# Patient Record
Sex: Male | Born: 1955 | Race: Black or African American | Hispanic: No | Smoking: Former smoker
Health system: Southern US, Community
[De-identification: ages and names within clinical notes are randomized; demographics above are authoritative.]

## PROBLEM LIST (undated history)

## (undated) DIAGNOSIS — K921 Melena: Secondary | ICD-10-CM

## (undated) DIAGNOSIS — E119 Type 2 diabetes mellitus without complications: Secondary | ICD-10-CM

## (undated) DIAGNOSIS — I252 Old myocardial infarction: Secondary | ICD-10-CM

## (undated) DIAGNOSIS — R079 Chest pain, unspecified: Secondary | ICD-10-CM

## (undated) DIAGNOSIS — C61 Malignant neoplasm of prostate: Secondary | ICD-10-CM

## (undated) DIAGNOSIS — M109 Gout, unspecified: Secondary | ICD-10-CM

## (undated) DIAGNOSIS — I10 Essential (primary) hypertension: Secondary | ICD-10-CM

## (undated) DIAGNOSIS — Z9289 Personal history of other medical treatment: Secondary | ICD-10-CM

## (undated) HISTORY — DX: Chest pain, unspecified: R07.9

## (undated) HISTORY — DX: Old myocardial infarction: I25.2

## (undated) HISTORY — DX: Morbid (severe) obesity due to excess calories: E66.01

## (undated) HISTORY — DX: Melena: K92.1

## (undated) HISTORY — PX: OTHER SURGICAL HISTORY: SHX169

## (undated) HISTORY — DX: Personal history of other medical treatment: Z92.89

## (undated) HISTORY — DX: Gout, unspecified: M10.9

---

## 2006-03-05 ENCOUNTER — Other Ambulatory Visit: Payer: Self-pay

## 2006-03-05 ENCOUNTER — Emergency Department: Payer: Self-pay | Admitting: Emergency Medicine

## 2006-12-31 ENCOUNTER — Emergency Department: Payer: Self-pay | Admitting: Emergency Medicine

## 2008-05-09 ENCOUNTER — Emergency Department: Payer: Self-pay | Admitting: Emergency Medicine

## 2009-10-07 ENCOUNTER — Emergency Department: Payer: Self-pay | Admitting: Internal Medicine

## 2009-12-27 DEATH — deceased

## 2011-11-13 ENCOUNTER — Emergency Department: Payer: Self-pay | Admitting: Unknown Physician Specialty

## 2012-07-30 ENCOUNTER — Emergency Department: Payer: Self-pay | Admitting: Emergency Medicine

## 2013-09-07 ENCOUNTER — Emergency Department: Payer: Self-pay | Admitting: Emergency Medicine

## 2013-09-07 LAB — COMPREHENSIVE METABOLIC PANEL
Anion Gap: 7 (ref 7–16)
BUN: 18 mg/dL (ref 7–18)
Bilirubin,Total: 0.3 mg/dL (ref 0.2–1.0)
Calcium, Total: 9.1 mg/dL (ref 8.5–10.1)
Co2: 27 mmol/L (ref 21–32)
EGFR (African American): >60
EGFR (Non-African Amer.): >60
Glucose: 122 mg/dL — ABNORMAL HIGH (ref 65–99)
Potassium: 4 mmol/L (ref 3.5–5.1)
SGOT(AST): 16 U/L (ref 15–37)
Total Protein: 7.6 g/dL (ref 6.4–8.2)

## 2013-09-07 LAB — CBC
HCT: 35 % — ABNORMAL LOW (ref 40.0–52.0)
HGB: 12.2 g/dL — ABNORMAL LOW (ref 13.0–18.0)
MCHC: 35 g/dL (ref 32.0–36.0)
MCV: 90 fL (ref 80–100)
RDW: 13.4 % (ref 11.5–14.5)
WBC: 8.6 10*3/uL (ref 3.8–10.6)

## 2013-09-07 LAB — URIC ACID: Uric Acid: 3.6 mg/dL (ref 3.5–7.2)

## 2013-09-23 ENCOUNTER — Emergency Department: Payer: Self-pay | Admitting: Emergency Medicine

## 2013-09-23 LAB — CBC WITH DIFFERENTIAL/PLATELET
Basophil #: 0.1 10*3/uL (ref 0.0–0.1)
Basophil %: 1.3 %
Eosinophil %: 3 %
HGB: 11.5 g/dL — ABNORMAL LOW (ref 13.0–18.0)
Lymphocyte #: 2.1 10*3/uL (ref 1.0–3.6)
MCH: 31 pg (ref 26.0–34.0)
MCV: 91 fL (ref 80–100)
Monocyte #: 0.5 x10 3/mm (ref 0.2–1.0)
Monocyte %: 5.3 %
Neutrophil %: 66.3 %
RBC: 3.71 10*6/uL — ABNORMAL LOW (ref 4.40–5.90)

## 2013-09-23 LAB — COMPREHENSIVE METABOLIC PANEL
Albumin: 3.4 g/dL (ref 3.4–5.0)
Alkaline Phosphatase: 117 U/L (ref 50–136)
Anion Gap: 3 — ABNORMAL LOW (ref 7–16)
BUN: 27 mg/dL — ABNORMAL HIGH (ref 7–18)
Bilirubin,Total: 0.3 mg/dL (ref 0.2–1.0)
Co2: 28 mmol/L (ref 21–32)
Glucose: 116 mg/dL — ABNORMAL HIGH (ref 65–99)
Osmolality: 280 (ref 275–301)
SGOT(AST): 27 U/L (ref 15–37)
SGPT (ALT): 23 U/L (ref 12–78)
Sodium: 137 mmol/L (ref 136–145)
Total Protein: 8.1 g/dL (ref 6.4–8.2)

## 2013-09-23 LAB — TROPONIN I
Troponin-I: <0.02 ng/mL
Troponin-I: <0.02 ng/mL

## 2013-09-23 LAB — TSH: Thyroid Stimulating Horm: 2.49 u[IU]/mL

## 2013-09-24 ENCOUNTER — Ambulatory Visit: Payer: Self-pay | Admitting: Physician Assistant

## 2015-02-07 ENCOUNTER — Emergency Department: Payer: Self-pay | Admitting: Student

## 2015-03-30 IMAGING — CR DG CHEST 2V
1 series · 2 of 2 positions shown · non-contrast
Comparison: none

REASON FOR EXAM: Chest Pain
COMMENTS:

PROCEDURE:     DXR - DXR CHEST PA (OR AP) AND LATERAL  - September 07, 2013  [DATE]
RESULT:     The lungs are clear. The cardiac silhouette and visualized bony
skeleton are unremarkable.

[Series 1: w chest pa · 0.14mm/px · 2 of 2 slices shown]
[im 1/2]
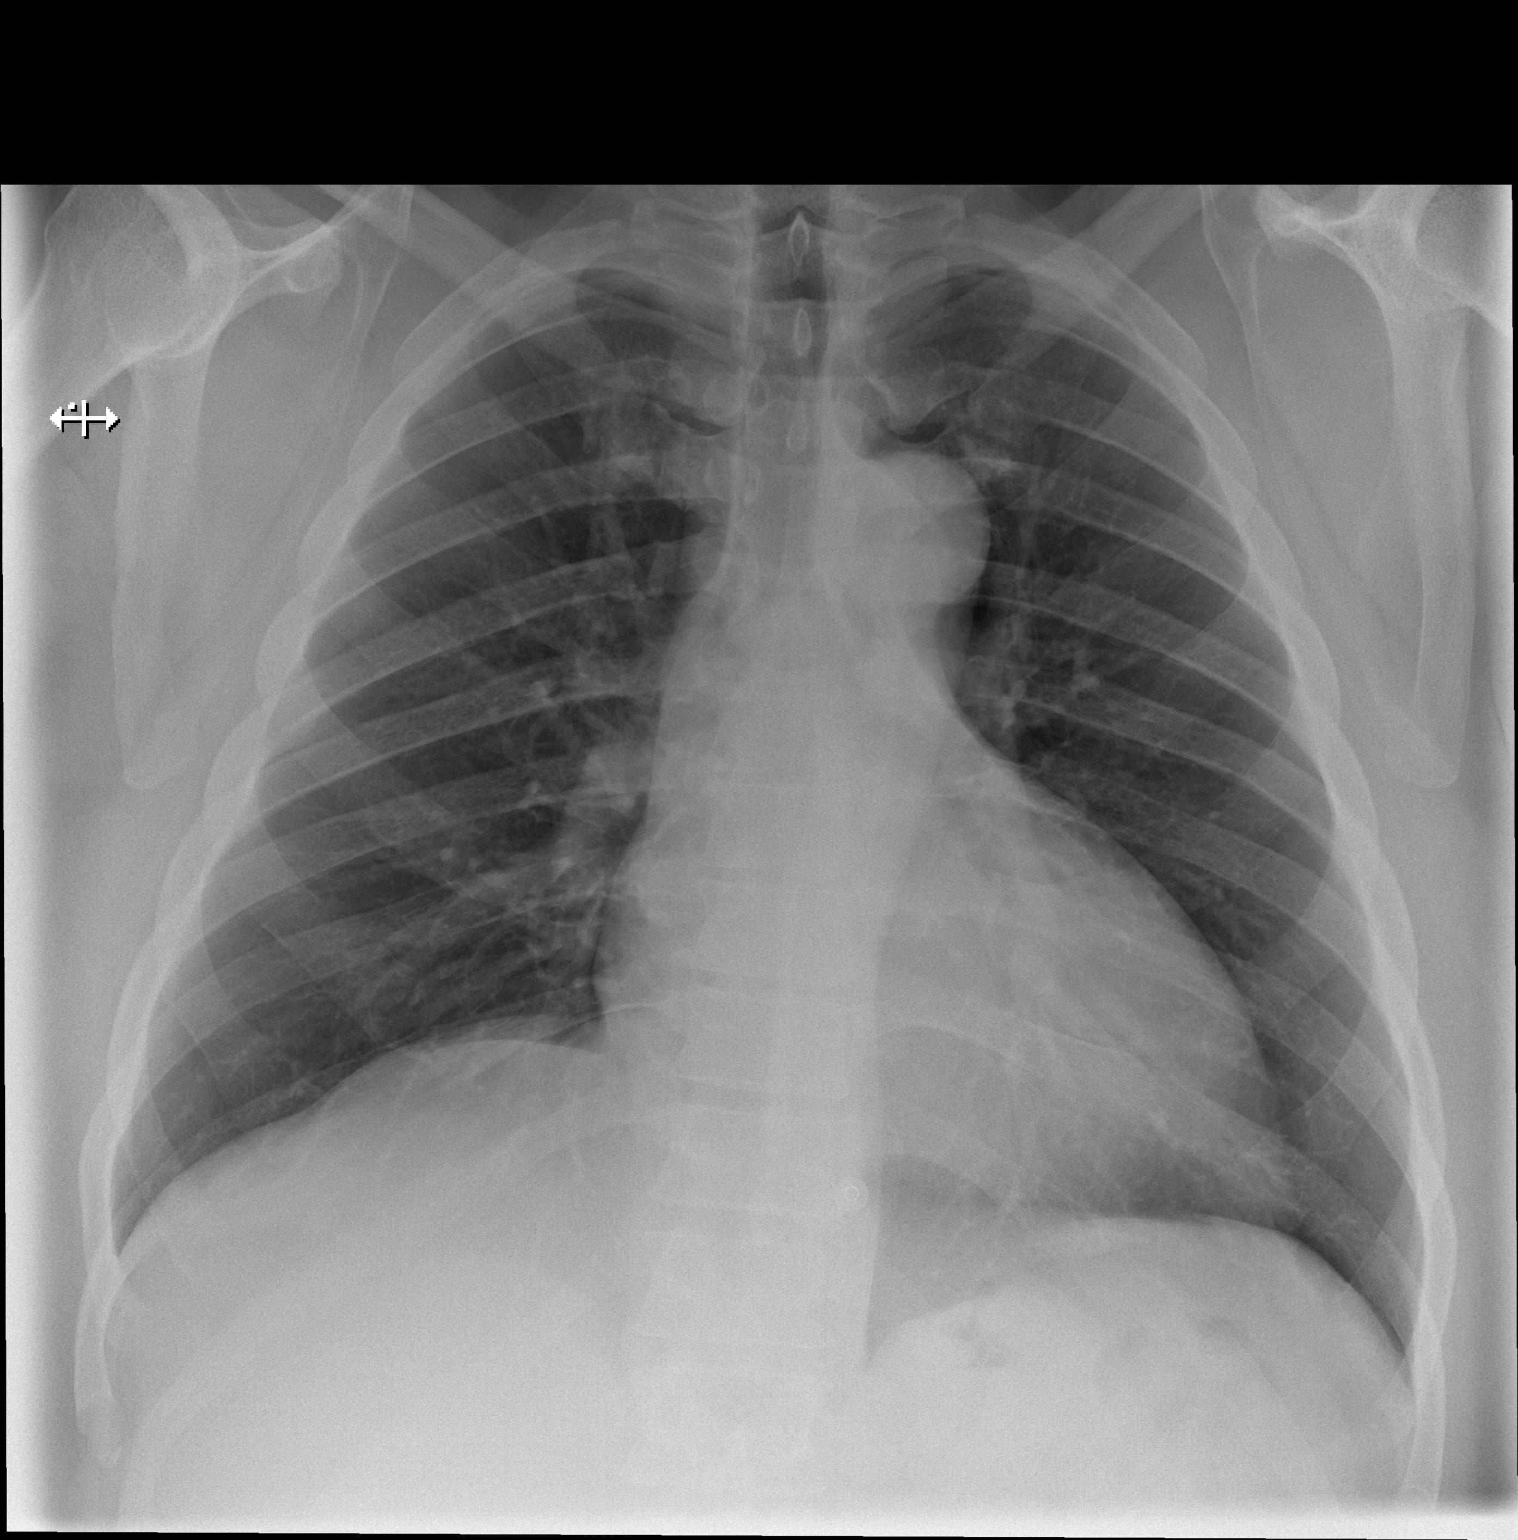
[im 2/2]
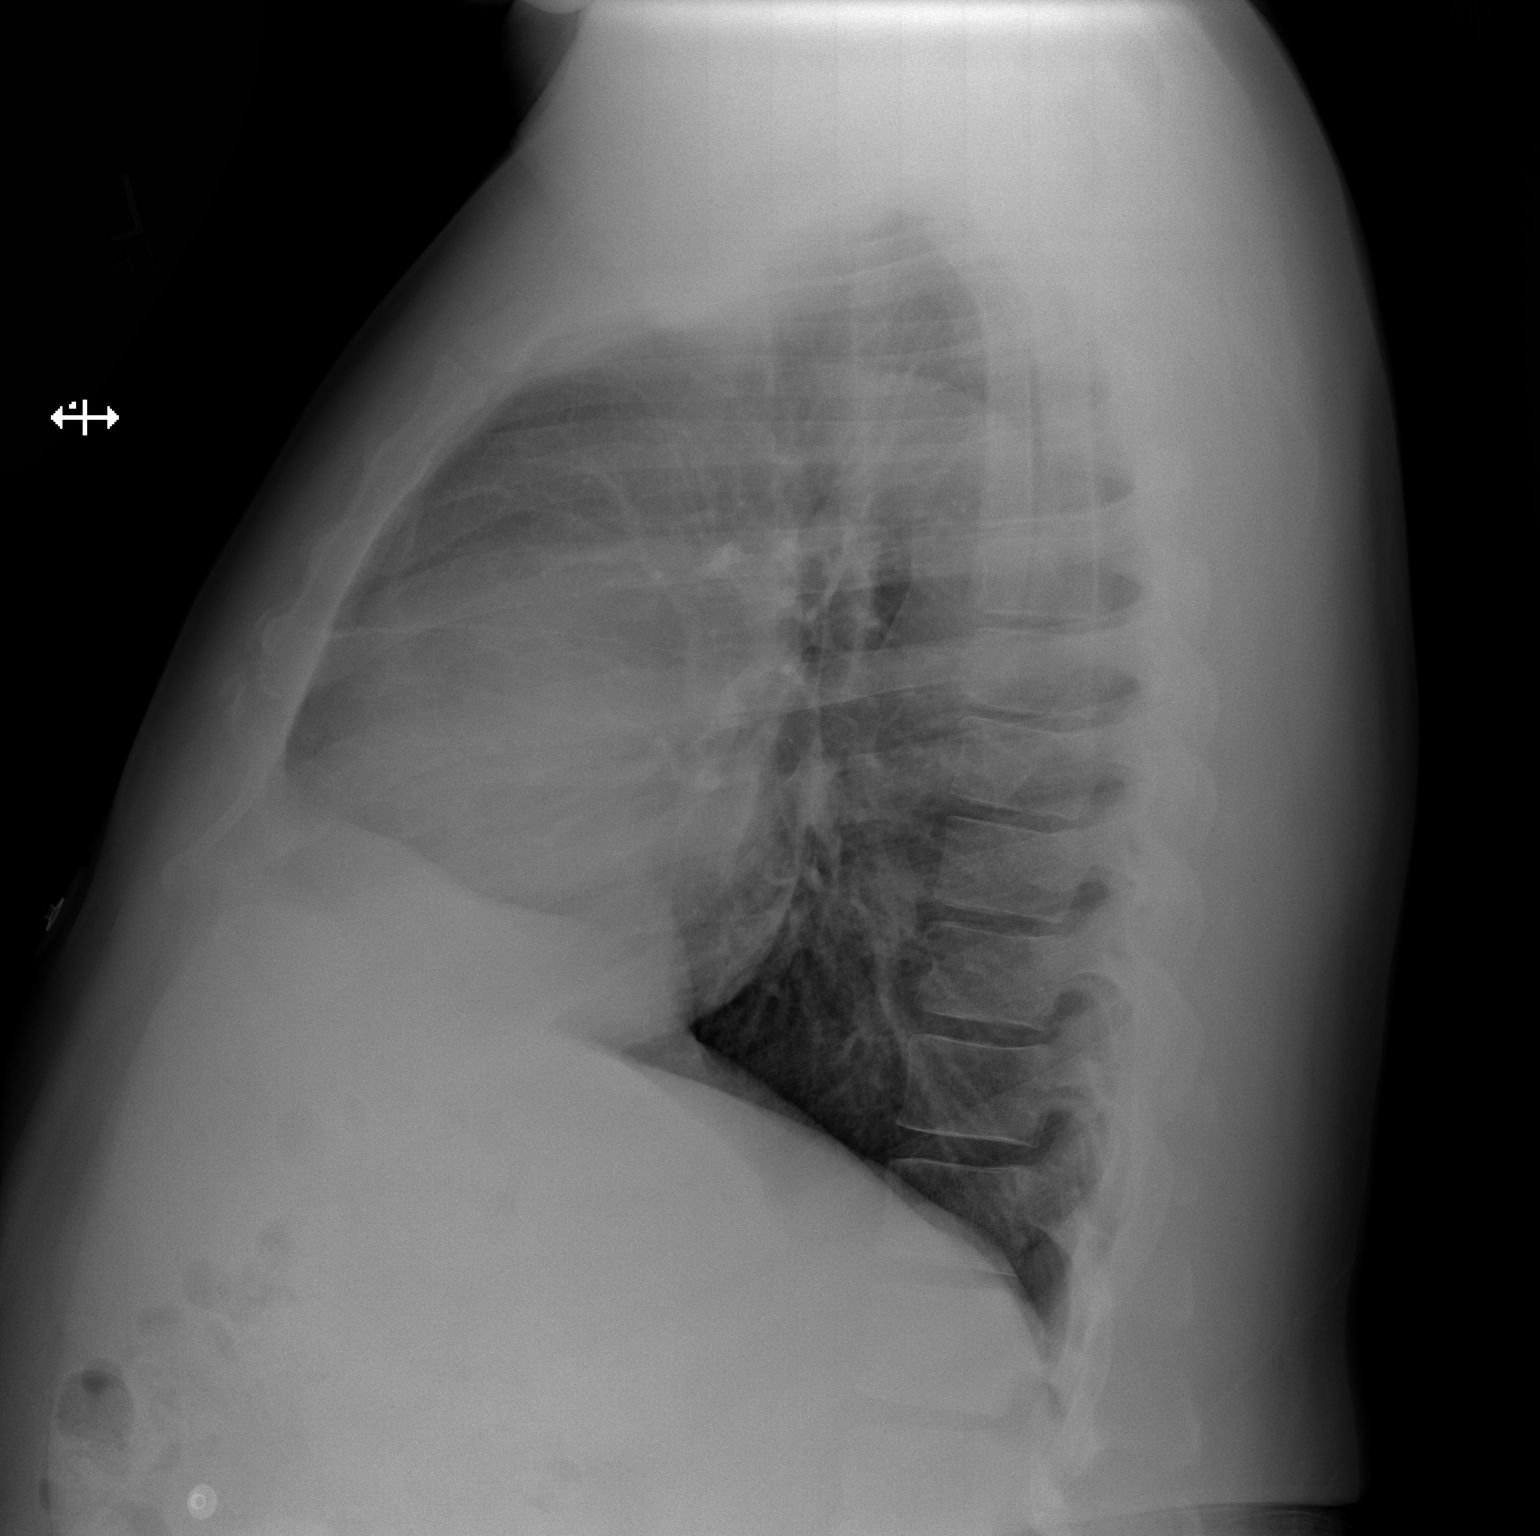

[2 of 2 positions shown; findings below may reference images not displayed]

IMPRESSION: 1. Chest radiograph without evidence of acute cardiopulmonary disease.
2. Comparison dated 07/30/2012.

## 2015-04-24 ENCOUNTER — Emergency Department
Admission: EM | Admit: 2015-04-24 | Discharge: 2015-04-24 | Disposition: A | Discharge status: Home / Self Care | Payer: Self-pay | Attending: Emergency Medicine | Admitting: Emergency Medicine

## 2015-04-24 ENCOUNTER — Encounter: Payer: Self-pay | Admitting: Emergency Medicine

## 2015-04-24 DIAGNOSIS — Y9389 Activity, other specified: Secondary | ICD-10-CM | POA: Insufficient documentation

## 2015-04-24 DIAGNOSIS — S41112A Laceration without foreign body of left upper arm, initial encounter: Secondary | ICD-10-CM | POA: Insufficient documentation

## 2015-04-24 DIAGNOSIS — Y92009 Unspecified place in unspecified non-institutional (private) residence as the place of occurrence of the external cause: Secondary | ICD-10-CM | POA: Insufficient documentation

## 2015-04-24 DIAGNOSIS — Y998 Other external cause status: Secondary | ICD-10-CM | POA: Insufficient documentation

## 2015-04-24 MED ORDER — LIDOCAINE-EPINEPHRINE (PF) 1 %-1:200000 IJ SOLN
INTRAMUSCULAR | Status: AC
  Administered 2015-04-24: 20:00:00
  Filled 2015-04-24: qty 30

## 2015-04-24 NOTE — Discharge Instructions (Signed)
Laceration Care, Adult A laceration is a cut that goes through all layers of the skin. The cut goes into the tissue beneath the skin. HOME CARE For stitches (sutures) or staples:  Keep the cut clean and dry.  If you have a bandage (dressing), change it at least once a day. Change the bandage if it gets wet or dirty, or as told by your doctor.  Wash the cut with soap and water 2 times a day. Rinse the cut with water. Pat it dry with a clean towel.  Put a thin layer of medicated cream on the cut as told by your doctor.  You may shower after the first 24 hours. Do not soak the cut in water until the stitches are removed.  Only take medicines as told by your doctor.  Have your stitches or staples removed as told by your doctor. For skin adhesive strips:  Keep the cut clean and dry.  Do not get the strips wet. You may take a bath, but be careful to keep the cut dry.  If the cut gets wet, pat it dry with a clean towel.  The strips will fall off on their own. Do not remove the strips that are still stuck to the cut. For wound glue:  You may shower or take baths. Do not soak or scrub the cut. Do not swim. Avoid heavy sweating until the glue falls off on its own. After a shower or bath, pat the cut dry with a clean towel.  Do not put medicine on your cut until the glue falls off.  If you have a bandage, do not put tape over the glue.  Avoid lots of sunlight or tanning lamps until the glue falls off. Put sunscreen on the cut for the first year to reduce your scar.  The glue will fall off on its own. Do not pick at the glue. You may need a tetanus shot if:  You cannot remember when you had your last tetanus shot.  You have never had a tetanus shot. If you need a tetanus shot and you choose not to have one, you may get tetanus. Sickness from tetanus can be serious. GET HELP RIGHT AWAY IF:   Your pain does not get better with medicine.  Your arm, hand, leg, or foot loses feeling  (numbness) or changes color.  Your cut is bleeding.  Your joint feels weak, or you cannot use your joint.  You have painful lumps on your body.  Your cut is red, puffy (swollen), or painful.  You have a red line on the skin near the cut.  You have yellowish-white fluid (pus) coming from the cut.  You have a fever.  You have a bad smell coming from the cut or bandage.  Your cut breaks open before or after stitches are removed.  You notice something coming out of the cut, such as wood or glass.  You cannot move a finger or toe. MAKE SURE YOU:   Understand these instructions.  Will watch your condition.  Will get help right away if you are not doing well or get worse. Document Released: 04/30/2008 Document Revised: 02/04/2012 Document Reviewed: 05/08/2011 [facility] Patient Information 2015 [place], [place]. This information is not intended to replace advice given to you by your health care provider. Make sure you discuss any questions you have with your health care provider.   Keep the wound clean, dry, and covered.  Return as needed for wound checks.  Return in  12-14 days for suture removal.
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
## 2015-04-24 NOTE — ED Notes (Signed)
Barkley Boards, RN assessed patient (unable to chart due to employee login trouble)
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
## 2015-05-08 ENCOUNTER — Emergency Department
Admission: EM | Admit: 2015-05-08 | Discharge: 2015-05-08 | Disposition: A | Discharge status: Home / Self Care | Payer: Self-pay | Attending: Emergency Medicine | Admitting: Emergency Medicine

## 2015-05-08 DIAGNOSIS — IMO0002 Reserved for concepts with insufficient information to code with codable children: Secondary | ICD-10-CM

## 2015-05-08 DIAGNOSIS — Z4801 Encounter for change or removal of surgical wound dressing: Secondary | ICD-10-CM | POA: Insufficient documentation

## 2015-05-08 DIAGNOSIS — Z4802 Encounter for removal of sutures: Secondary | ICD-10-CM | POA: Insufficient documentation

## 2015-05-08 DIAGNOSIS — I10 Essential (primary) hypertension: Secondary | ICD-10-CM | POA: Insufficient documentation

## 2015-05-08 HISTORY — DX: Essential (primary) hypertension: I10

## 2015-05-08 NOTE — ED Provider Notes (Signed)
[facility] Emergency Department Provider Note  ____________________________________________  Time seen: Approximately 1045  I have reviewed the triage vital signs and the nursing notes.   HISTORY  Chief Complaint Suture / Staple Removal    HPI Roger [name] is a 59 y.o. male here today for suture removal states that he had sutures placed in his left tricep approximately 2 weeks ago has had no complications with the denies any pain says it itches a little bit no numbness no tingling no drainage no inflammation   Past Medical History  Diagnosis Date  . Hypertension     There are no active problems to display for this patient.   No past surgical history on file.  No current outpatient prescriptions on file.  Allergies Review of patient's allergies indicates no known allergies.  No family history on file.  Social History History  Substance Use Topics  . Smoking status: Never Smoker   . Smokeless tobacco: Not on file  . Alcohol Use: No    Review of Systems Constitutional: No fever/chills Eyes: No visual changes. ENT: No sore throat. Cardiovascular: Denies chest pain. Respiratory: Denies shortness of breath. Gastrointestinal: No abdominal pain.  No nausea, no vomiting.  No diarrhea.  No constipation. Genitourinary: Negative for dysuria. Musculoskeletal: Negative for back pain. Skin: Negative for rash. Neurological: Negative for headaches, focal weakness or numbness.  10-point ROS otherwise negative.  ____________________________________________   PHYSICAL EXAM:  VITAL SIGNS: ED Triage Vitals  Enc Vitals Group     BP 05/08/15 1012 171/126 mmHg     Pulse Rate 05/08/15 1012 79     Resp 05/08/15 1012 18     Temp 05/08/15 1012 98 F (36.7 C)     Temp Source 05/08/15 1012 Oral     SpO2 05/08/15 1012 95 %     Weight 05/08/15 1012 275 lb (124.739 kg)     Height 05/08/15 1012 5\' 10"  (1.778 m)     Head Cir --      Peak Flow --      Pain Score 05/08/15 1014 6     Pain Loc --      Pain Edu? --      Excl. in [place]? --     Constitutional: Alert and oriented. Well appearing and in no acute distress.  Musculoskeletal: No lower extremity tenderness nor edema.  No joint effusions. Neurologic:  Normal speech and language. No gross focal neurologic deficits are appreciated. Speech is normal. No gait instability. Skin:  Skin is warm, dry and intact. No rash noted. Well-healing wound to the left tricep 14 sutures in place Psychiatric: Mood and affect are normal. Speech and behavior are normal.  ____________________________________________   PROCEDURES  Procedure(s) performed: None  Critical Care performed: No  ____________________________________________   INITIAL IMPRESSION / ASSESSMENT AND PLAN / ED COURSE  Pertinent labs & imaging results that were available during my care of the patient were reviewed by me and considered in my medical decision making (see chart for details).  Initial impression on this patient laceration recheck suture removal patient wound was well-healed sutures were removed was discharged home ____________________________________________   FINAL CLINICAL IMPRESSION(S) / ED DIAGNOSES  Final diagnoses:  Visit for suture removal  Laceration re-check     Roger Rosenwald JAMESEN STAHNKE, PA-C 05/08/15 1104  Lisa Roca, MD 05/08/15 1529

## 2015-05-08 NOTE — ED Notes (Signed)
Pt here for suture removal of left upper arm

## 2015-05-08 NOTE — ED Notes (Signed)
NAD noted at time of D/C. Pt denies questions or concerns. Pt ambulatory to the lobby at this time.  

## 2015-11-11 ENCOUNTER — Emergency Department
Admission: EM | Admit: 2015-11-11 | Discharge: 2015-11-11 | Disposition: A | Discharge status: Home / Self Care | Payer: Self-pay | Attending: Emergency Medicine | Admitting: Emergency Medicine

## 2015-11-11 ENCOUNTER — Encounter: Payer: Self-pay | Admitting: *Deleted

## 2015-11-11 DIAGNOSIS — I10 Essential (primary) hypertension: Secondary | ICD-10-CM | POA: Insufficient documentation

## 2015-11-11 DIAGNOSIS — M1000 Idiopathic gout, unspecified site: Secondary | ICD-10-CM | POA: Insufficient documentation

## 2015-11-11 LAB — BASIC METABOLIC PANEL
ANION GAP: 9 (ref 5–15)
BUN: 25 mg/dL — ABNORMAL HIGH (ref 6–20)
CO2: 26 mmol/L (ref 22–32)
Calcium: 9.3 mg/dL (ref 8.9–10.3)
Chloride: 104 mmol/L (ref 101–111)
Creatinine, Ser: 1.26 mg/dL — ABNORMAL HIGH (ref 0.61–1.24)
GFR calc Af Amer: >60 mL/min (ref >60)
GFR calc non Af Amer: >60 mL/min (ref >60)
Glucose, Bld: 128 mg/dL — ABNORMAL HIGH (ref 65–99)
Potassium: 4 mmol/L (ref 3.5–5.1)
Sodium: 139 mmol/L (ref 135–145)

## 2015-11-11 LAB — CBC WITH DIFFERENTIAL/PLATELET
BASOS ABS: 0 10*3/uL (ref 0–0.1)
Basophils Relative: 0 %
Eosinophils Absolute: 0.2 10*3/uL (ref 0–0.7)
Eosinophils Relative: 2 %
HEMATOCRIT: 36.2 % — AB (ref 40.0–52.0)
Hemoglobin: 12.1 g/dL — ABNORMAL LOW (ref 13.0–18.0)
LYMPHS ABS: 1.5 10*3/uL (ref 1.0–3.6)
Lymphocytes Relative: 15 %
MCH: 30.6 pg (ref 26.0–34.0)
MCHC: 33.3 g/dL (ref 32.0–36.0)
MCV: 92 fL (ref 80.0–100.0)
Monocytes Absolute: 0.7 10*3/uL (ref 0.2–1.0)
Monocytes Relative: 7 %
NEUTROS ABS: 7.7 10*3/uL — AB (ref 1.4–6.5)
Neutrophils Relative %: 76 %
Platelets: 295 10*3/uL (ref 150–440)
RBC: 3.93 MIL/uL — ABNORMAL LOW (ref 4.40–5.90)
RDW: 13 % (ref 11.5–14.5)
WBC: 10.2 10*3/uL (ref 3.8–10.6)

## 2015-11-11 LAB — TROPONIN I: Troponin I: <0.03 ng/mL (ref <0.031)

## 2015-11-11 LAB — URIC ACID: Uric Acid, Serum: 9 mg/dL — ABNORMAL HIGH (ref 4.4–7.6)

## 2015-11-11 MED ORDER — PREDNISONE 20 MG PO TABS
60.0000 mg | ORAL_TABLET | Freq: Once | ORAL | Status: AC
  Administered 2015-11-11: 60 mg via ORAL
  Filled 2015-11-11: qty 3

## 2015-11-11 MED ORDER — IBUPROFEN 800 MG PO TABS: 800.0000 mg | ORAL_TABLET | Freq: Three times a day (TID) | ORAL | Status: DC | PRN

## 2015-11-11 MED ORDER — IBUPROFEN 800 MG PO TABS
800.0000 mg | ORAL_TABLET | Freq: Once | ORAL | Status: AC
  Administered 2015-11-11: 800 mg via ORAL
  Filled 2015-11-11: qty 1

## 2015-11-11 MED ORDER — PANTOPRAZOLE SODIUM 40 MG PO TBEC: 40.0000 mg | DELAYED_RELEASE_TABLET | Freq: Every day | ORAL | Status: DC

## 2015-11-11 MED ORDER — PREDNISONE 10 MG PO TABS: 10.0000 mg | ORAL_TABLET | Freq: Every day | ORAL | Status: DC

## 2015-11-11 NOTE — ED Provider Notes (Signed)
[facility] Emergency Department Provider Note  ____________________________________________  Time seen: Approximately 2:02 PM  I have reviewed the triage vital signs and the nursing notes.   HISTORY  Chief Complaint Dizziness    HPI Roger [name] is a 59 y.o. male who reports his gout is flaring up. He says for the last 2 weeks as well as been cold he's been having a lot of pain in his feet and ankles mid foot and especially the MCP of the great toe. He also has some pain in his right elbow and hands. Which she says is his usual gouty pain. Been going to work on crutches because this helps keep some of the weight off his feet and he can then walk. Patient reports he is not drinking much because he doesn't want to have to go to walk to the bathroom on his painful feet. Patient reports after he stands for long time he gets a little bit lightheaded. Patient has no nausea vomiting headache chills cough diarrhea no spinning sensation no other complaints.   Past Medical History  Diagnosis Date  . Hypertension    patient is also taking metformin probably for diabetes although he says he doesn't have it  There are no active problems to display for this patient.   History reviewed. No pertinent past surgical history.  Current Outpatient Rx  Name  Route  Sig  Dispense  Refill  . ibuprofen (ADVIL,MOTRIN) 800 MG tablet   Oral   Take 1 tablet (800 mg total) by mouth every 8 (eight) hours as needed.   30 tablet   0   . pantoprazole (PROTONIX) 40 MG tablet   Oral   Take 1 tablet (40 mg total) by mouth daily.   30 tablet   1   . predniSONE (DELTASONE) 10 MG tablet   Oral   Take 1 tablet (10 mg total) by mouth daily. Take 4 pills a day for 3 days then Take 3 pills a day for 1 day then Take 2 pills a day for 1 day then thakl 1 pill a day for  1 day then stop   18 tablet   0     Allergies Review of patient's allergies indicates no known allergies.  No  family history on file.  Social History Social History  Substance Use Topics  . Smoking status: Never Smoker   . Smokeless tobacco: None  . Alcohol Use: No    Review of Systems Constitutional: No fever/chills Eyes: No visual changes. ENT: No sore throat. Cardiovascular: Denies chest pain. Respiratory: Denies shortness of breath. Gastrointestinal: No abdominal pain.  No nausea, no vomiting.  No diarrhea.  No constipation. Genitourinary: Negative for dysuria. Musculoskeletal: Negative for back pain. Skin: Negative for rash. Neurological: Negative for headaches, focal weakness or numbness.  10-point ROS otherwise negative.  ____________________________________________   PHYSICAL EXAM:  VITAL SIGNS: ED Triage Vitals  Enc Vitals Group     BP 11/11/15 1253 121/72 mmHg     Pulse Rate 11/11/15 1253 91     Resp 11/11/15 1253 20     Temp 11/11/15 1253 97.8 F (36.6 C)     Temp Source 11/11/15 1253 Oral     SpO2 11/11/15 1253 98 %     Weight 11/11/15 1253 270 lb (122.471 kg)     Height 11/11/15 1253 5\' 10"  (1.778 m)     Head Cir --      Peak Flow --  Pain Score 11/11/15 1254 10     Pain Loc --      Pain Edu? --      Excl. in [place]? --     Constitutional: Alert and oriented. Well appearing and in no acute distress. Eyes: Conjunctivae are normal. PERRL. EOMI. Head: Atraumatic. Nose: No congestion/rhinnorhea. Mouth/Throat: Mucous membranes are moist.  Oropharynx non-erythematous. Neck: No stridor.        Cardiovascular: Normal rate, regular rhythm. Grossly normal heart sounds.  Good peripheral circulation. Respiratory: Normal respiratory effort.  No retractions. Lungs CTAB. Gastrointestinal: Soft and nontender. No distention. No abdominal bruits. No CVA tenderness. Musculoskeletal: Both ankles are somewhat tender to palpation. They're not swollen or red however. The mid foot on both feet are tender as well without any redness or swelling. There is redness and swelling and  tenderness and warmth over both MCP joints of both great toes. Patient has a nodule in the palm of the left hand over the next flexor tendon of the ring finger and there is a little bit of palpable nodule on the ulnar surface of the forearm Neurologic:  Normal speech and language. No gross focal neurologic deficits are appreciated. No gait instability. Skin:  Skin is warm, dry and intact. No rash noted. Psychiatric: Mood and affect are normal. Speech and behavior are normal.  ____________________________________________   LABS (all labs ordered are listed, but only abnormal results are displayed)  Labs Reviewed  BASIC METABOLIC PANEL - Abnormal; Notable for the following:    Glucose, Bld 128 (*)    BUN 25 (*)    Creatinine, Ser 1.26 (*)    All other components within normal limits  CBC WITH DIFFERENTIAL/PLATELET - Abnormal; Notable for the following:    RBC 3.93 (*)    Hemoglobin 12.1 (*)    HCT 36.2 (*)    Neutro Abs 7.7 (*)    All other components within normal limits  URIC ACID - Abnormal; Notable for the following:    Uric Acid, Serum 9.0 (*)    All other components within normal limits  TROPONIN I   ____________________________________________  EKG   ____________________________________________  RADIOLOGY   ____________________________________________   PROCEDURES   ____________________________________________   INITIAL IMPRESSION / ASSESSMENT AND PLAN / ED COURSE  Pertinent labs & imaging results that were available during my care of the patient were reviewed by me and considered in my medical decision making (see chart for details).   ____________________________________________   FINAL CLINICAL IMPRESSION(S) / ED DIAGNOSES  Final diagnoses:  Idiopathic gout, unspecified chronicity, unspecified site      Nena Polio, MD 11/11/15 2155

## 2015-11-11 NOTE — ED Notes (Signed)
Bilateral feet pain and swellingdizziness started today

## 2015-11-11 NOTE — Discharge Instructions (Signed)
Gout Gout is an inflammatory arthritis caused by a buildup of uric acid crystals in the joints. Uric acid is a chemical that is normally present in the blood. When the level of uric acid in the blood is too high it can form crystals that deposit in your joints and tissues. This causes joint redness, soreness, and swelling (inflammation). Repeat attacks are common. Over time, uric acid crystals can form into masses (tophi) near a joint, destroying bone and causing disfigurement. Gout is treatable and often preventable. CAUSES  The disease begins with elevated levels of uric acid in the blood. Uric acid is produced by your body when it breaks down a naturally found substance called purines. Certain foods you eat, such as meats and fish, contain high amounts of purines. Causes of an elevated uric acid level include:  Being passed down from parent to child (heredity).  Diseases that cause increased uric acid production (such as obesity, psoriasis, and certain cancers).  Excessive alcohol use.  Diet, especially diets rich in meat and seafood.  Medicines, including certain cancer-fighting medicines (chemotherapy), water pills (diuretics), and aspirin.  Chronic kidney disease. The kidneys are no longer able to remove uric acid well.  Problems with metabolism. Conditions strongly associated with gout include:  Obesity.  High blood pressure.  High cholesterol.  Diabetes. Not everyone with elevated uric acid levels gets gout. It is not understood why some people get gout and others do not. Surgery, joint injury, and eating too much of certain foods are some of the factors that can lead to gout attacks. SYMPTOMS   An attack of gout comes on quickly. It causes intense pain with redness, swelling, and warmth in a joint.  Fever can occur.  Often, only one joint is involved. Certain joints are more commonly involved:  Base of the big toe.  Knee.  Ankle.  Wrist.  Finger. Without  treatment, an attack usually goes away in a few days to weeks. Between attacks, you usually will not have symptoms, which is different from many other forms of arthritis. DIAGNOSIS  Your caregiver will suspect gout based on your symptoms and exam. In some cases, tests may be recommended. The tests may include:  Blood tests.  Urine tests.  X-rays.  Joint fluid exam. This exam requires a needle to remove fluid from the joint (arthrocentesis). Using a microscope, gout is confirmed when uric acid crystals are seen in the joint fluid. TREATMENT  There are two phases to gout treatment: treating the sudden onset (acute) attack and preventing attacks (prophylaxis).  Treatment of an Acute Attack.  Medicines are used. These include anti-inflammatory medicines or steroid medicines.  An injection of steroid medicine into the affected joint is sometimes necessary.  The painful joint is rested. Movement can worsen the arthritis.  You may use warm or cold treatments on painful joints, depending which works best for you.  Treatment to Prevent Attacks.  If you suffer from frequent gout attacks, your caregiver may advise preventive medicine. These medicines are started after the acute attack subsides. These medicines either help your kidneys eliminate uric acid from your body or decrease your uric acid production. You may need to stay on these medicines for a very long time.  The early phase of treatment with preventive medicine can be associated with an increase in acute gout attacks. For this reason, during the first few months of treatment, your caregiver may also advise you to take medicines usually used for acute gout treatment. Be sure you  understand your caregiver's directions. Your caregiver may make several adjustments to your medicine dose before these medicines are effective.  Discuss dietary treatment with your caregiver or dietitian. Alcohol and drinks high in sugar and fructose and foods  such as meat, poultry, and seafood can increase uric acid levels. Your caregiver or dietitian can advise you on drinks and foods that should be limited. HOME CARE INSTRUCTIONS   Do not take aspirin to relieve pain. This raises uric acid levels.  Only take over-the-counter or prescription medicines for pain, discomfort, or fever as directed by your caregiver.  Rest the joint as much as possible. When in bed, keep sheets and blankets off painful areas.  Keep the affected joint raised (elevated).  Apply warm or cold treatments to painful joints. Use of warm or cold treatments depends on which works best for you.  Use crutches if the painful joint is in your leg.  Drink enough fluids to keep your urine clear or pale yellow. This helps your body get rid of uric acid. Limit alcohol, sugary drinks, and fructose drinks.  Follow your dietary instructions. Pay careful attention to the amount of protein you eat. Your daily diet should emphasize fruits, vegetables, whole grains, and fat-free or low-fat milk products. Discuss the use of coffee, vitamin C, and cherries with your caregiver or dietitian. These may be helpful in lowering uric acid levels.  Maintain a healthy body weight. SEEK MEDICAL CARE IF:   You develop diarrhea, vomiting, or any side effects from medicines.  You do not feel better in 24 hours, or you are getting worse. SEEK IMMEDIATE MEDICAL CARE IF:   Your joint becomes suddenly more tender, and you have chills or a fever. MAKE SURE YOU:   Understand these instructions.  Will watch your condition.  Will get help right away if you are not doing well or get worse.   This information is not intended to replace advice given to you by your health care provider. Make sure you discuss any questions you have with your health care provider.   Document Released: 11/09/2000 Document Revised: 12/03/2014 Document Reviewed: 06/25/2012 Elsevier Interactive Patient Education [employer]  I have given you a prescription for Motrin 800 mg 3 times a day to take for the gout. He can take that with the protonic to protect her stomach. Also given you a prescription for the prednisone. I started Roger [name] the prednisone here in the emergency room and then I have given you a tapering dose of the prednisone several more days. I think the prednisone will work faster. It also should have less side effects as far as her stomach in your kidneys. You can save the Motrin and Protonix for the next gout attack if you desire. Please be sure to follow up with your Dr. Princella Ion. Please return if you're worse to get a fever feels sicker or have any other problems develop. Please remember that he isn't prednisone can make your blood sugar go up. Blood sugar should go back down once the prednisone is finished.

## 2016-04-16 ENCOUNTER — Encounter: Payer: Self-pay | Admitting: Family Medicine

## 2016-04-16 ENCOUNTER — Ambulatory Visit (INDEPENDENT_AMBULATORY_CARE_PROVIDER_SITE_OTHER): Payer: BLUE CROSS/BLUE SHIELD | Admitting: Family Medicine

## 2016-04-16 VITALS — BP 156/98 | HR 70 | Temp 98.0°F | Ht 70.0 in | Wt 274.0 lb

## 2016-04-16 DIAGNOSIS — R079 Chest pain, unspecified: Secondary | ICD-10-CM

## 2016-04-16 DIAGNOSIS — M25562 Pain in left knee: Secondary | ICD-10-CM

## 2016-04-16 DIAGNOSIS — I252 Old myocardial infarction: Secondary | ICD-10-CM | POA: Insufficient documentation

## 2016-04-16 DIAGNOSIS — G629 Polyneuropathy, unspecified: Secondary | ICD-10-CM

## 2016-04-16 DIAGNOSIS — M25561 Pain in right knee: Secondary | ICD-10-CM | POA: Diagnosis not present

## 2016-04-16 DIAGNOSIS — I10 Essential (primary) hypertension: Secondary | ICD-10-CM

## 2016-04-16 DIAGNOSIS — M25569 Pain in unspecified knee: Secondary | ICD-10-CM | POA: Insufficient documentation

## 2016-04-16 DIAGNOSIS — K921 Melena: Secondary | ICD-10-CM

## 2016-04-16 LAB — CBC
HCT: 36.2 % — ABNORMAL LOW (ref 39.0–52.0)
Hemoglobin: 12.2 g/dL — ABNORMAL LOW (ref 13.0–17.0)
MCHC: 33.8 g/dL (ref 30.0–36.0)
MCV: 91.1 fl (ref 78.0–100.0)
Platelets: 278 10*3/uL (ref 150.0–400.0)
RBC: 3.98 Mil/uL — ABNORMAL LOW (ref 4.22–5.81)
RDW: 13.7 % (ref 11.5–15.5)
WBC: 9.3 10*3/uL (ref 4.0–10.5)

## 2016-04-16 LAB — COMPREHENSIVE METABOLIC PANEL
ALT: 23 U/L (ref 0–53)
AST: 14 U/L (ref 0–37)
Albumin: 3.8 g/dL (ref 3.5–5.2)
Alkaline Phosphatase: 71 U/L (ref 39–117)
BUN: 27 mg/dL — ABNORMAL HIGH (ref 6–23)
CO2: 30 meq/L (ref 19–32)
Calcium: 9.3 mg/dL (ref 8.4–10.5)
Chloride: 101 mEq/L (ref 96–112)
Creatinine, Ser: 1.27 mg/dL (ref 0.40–1.50)
GFR: 74.93 mL/min (ref >60.00)
Glucose, Bld: 158 mg/dL — ABNORMAL HIGH (ref 70–99)
Potassium: 4 mEq/L (ref 3.5–5.1)
Sodium: 137 mEq/L (ref 135–145)
Total Bilirubin: 0.4 mg/dL (ref 0.2–1.2)
Total Protein: 7 g/dL (ref 6.0–8.3)

## 2016-04-16 LAB — TSH: TSH: 2.78 u[IU]/mL (ref 0.35–4.50)

## 2016-04-16 LAB — BRAIN NATRIURETIC PEPTIDE: PRO B NATRI PEPTIDE: 22 pg/mL (ref 0.0–100.0)

## 2016-04-16 LAB — HEMOGLOBIN A1C: HEMOGLOBIN A1C: 8 % — AB (ref 4.6–6.5)

## 2016-04-16 MED ORDER — METOPROLOL TARTRATE 25 MG PO TABS: 25.0000 mg | ORAL_TABLET | Freq: Two times a day (BID) | ORAL | Status: DC

## 2016-04-16 NOTE — Progress Notes (Signed)
Pre visit review using our clinic review tool, if applicable. No additional management support is needed unless otherwise documented below in the visit note. 

## 2016-04-16 NOTE — Assessment & Plan Note (Addendum)
Uncontrolled. Previously was on a 2 drug regimen. Only on lisinopril now. I discussed increasing his lisinopril though he wanted to minimize lab draws. Given potential for CHF with lower extremity swelling and likely CAD we will place him on a beta blocker, metoprolol. Heart rate should be able to tolerate this. He is not in active CHF exacerbation and he has no diagnosed history of CHF. He'll follow up with cardiology as well. He is given return precautions.

## 2016-04-16 NOTE — Assessment & Plan Note (Addendum)
Patient's symptoms most consistent with stable angina. Slightly atypical in that it occurs on the right side and was exertional and associated shortness of breath. He has risk factors. EKG is stable from previously with inferior ST elevations noted. Patient has no active chest pain. Reportedly has had an MI previously. Doubt VTE given stable vital signs and recurrent nature. Doubt pulmonary cause given benign exam and stable vital signs. Not consistently musculoskeletal cause. We will refer back to cardiology for further evaluation.

## 2016-04-16 NOTE — Patient Instructions (Addendum)
Nice to meet you. We are going to refer you to cardiology for further evaluation of your chest pain. You should try Tylenol for your knee discomfort. We're going to have you complete stool cards for your blood in your stool. We will start you on metoprolol for your blood pressure. If you get too dizzy with this please let us know. If you develop persistent chest pain, shortness of breath, palpitations, blood in her stool, abdominal pain, worsening knee pain, numbness, weakness, vision changes, or any new or changing symptoms please seek medical attention.

## 2016-04-16 NOTE — Progress Notes (Signed)
Patient ID: Roger [name], male   DOB: 1956-04-23, 60 y.o.   MRN: [id number]  Roger Rumps, MD Phone: [phone]  Roger [name] is a 60 y.o. male who presents today for a new patient visit.  Knee pain: Patient notes bilateral knee pain for last several weeks. Started out in the right and then has started to move into the left. States it feels like his gout. Has difficult time describing the pain. Notes minimal swelling in his knees. No fevers. He states he was seen at the walk-in clinic a week ago and treated for gout. Has been taking prednisone. Notes this helped with his right knee though not as much with his left knee. He does take allopurinol.  Chest pain: Patient notes intermittently for the last several months he has had right sided chest discomfort. Notes it as a tightness. Associated with some shortness of breath. No radiation. No diaphoresis. Worsens with exertion and when his knees hurt. Last time this occurred was yesterday. Has previously been advised he had a heart attack by EKG and notes he had a stress test at [facility] several years ago. On review of prior EKGs he appears to have inferior ST elevation. Last saw cardiologist 1 year ago per his report. No history of VTE. No history of pulmonary issues. He does have diabetes and hypertension. He does note some orthostasis with lightheadedness on rising after bending over. No vertigo. No chest pain or shortness of breath at this time. He does note some mild intermittent bilateral lower extremity swelling. No orthopnea or PND.  Blood in his stool: This has been off and on for a year. Sometimes it's enough to make the water pink or red. Only occurs with bowel movements. Sometimes mixed in with the bowel movements. He has not ever had a colonoscopy.  Patient notes neuropathy in his fingers and toes where they intermittently feel numb and feel like pins and needles bilaterally. This has been going on for the last year.  HYPERTENSION Disease  Monitoring Chest pain- yes, see above    Dyspnea- yes, see above Medications Compliance-  taking lisinopril 20 mg daily, reports he was previously on amlodipine though had quite a bit of lightheadedness with this.. Lightheadedness-  some lightheadedness on rising  Edema- yes, see above   Active Ambulatory Problems    Diagnosis Date Noted  . Chest pain 04/16/2016  . Blood in stool 04/16/2016  . Neuropathy ([place]) 04/16/2016  . Knee pain 04/16/2016  . Essential hypertension 04/16/2016   Resolved Ambulatory Problems    Diagnosis Date Noted  . No Resolved Ambulatory Problems   Past Medical History  Diagnosis Date  . Hypertension     No family history on file.  Social History   Social History  . Marital Status: Divorced    Spouse Name: N/A  . Number of Children: N/A  . Years of Education: N/A   Occupational History  . Not on file.   Social History Main Topics  . Smoking status: Never Smoker   . Smokeless tobacco: Never Used  . Alcohol Use: No  . Drug Use: No  . Sexual Activity: Not on file   Other Topics Concern  . Not on file   Social History Narrative    ROS  General:  Negative for nexplained weight loss, fever Skin: Negative for new or changing mole, sore that won't heal HEENT: Positive for trouble seeing, Negative for trouble hearing, ringing in ears, mouth sores, hoarseness, change in voice, dysphagia. CV:  Positive for chest pain, dyspnea, edema, negative for palpitations Resp: Negative for cough, hemoptysis GI: Positive for hematochezia, Negative for nausea, vomiting, diarrhea, constipation, abdominal pain, melena. GU: Positive for sexual difficulty, Negative for dysuria, incontinence, urinary hesitance, hematuria, vaginal or penile discharge, polyuria, lumps in testicle or breasts MSK: Positive for muscle cramps or aches, joint pain or swelling Neuro: Positive for numbness and dizziness, Negative for headaches, weakness, passing out/fainting Psych: Negative  for depression, anxiety, positive for memory problems  Objective  Physical Exam Filed Vitals:   04/16/16 0808 04/16/16 0841  BP: 142/92 156/98  Pulse: 70   Temp: 98 F (36.7 C)     BP Readings from Last 3 Encounters:  04/16/16 156/98  11/11/15 104/70  05/08/15 171/126   Wt Readings from Last 3 Encounters:  04/16/16 274 lb (124.286 kg)  11/11/15 270 lb (122.471 kg)  05/08/15 275 lb (124.739 kg)    Physical Exam  Constitutional: He is well-developed, well-nourished, and in no distress.  HENT:  Head: Normocephalic and atraumatic.  Mouth/Throat: Oropharynx is clear and moist. No oropharyngeal exudate.  Eyes: Conjunctivae are normal. Pupils are equal, round, and reactive to light.  Neck: Neck supple.  Cardiovascular: Normal rate, regular rhythm and normal heart sounds.   Pulmonary/Chest: Effort normal and breath sounds normal.  Abdominal: Soft. Bowel sounds are normal. He exhibits no distension. There is no tenderness. There is no rebound and no guarding.  Genitourinary: Rectum normal and prostate normal. Guaiac negative stool.  Musculoskeletal:  Bilateral knees with no joint swelling or joint line tenderness, no warmth or erythema, no ligamentous laxity, negative McMurray's, patient reports stable varicose veins in bilateral legs  Lymphadenopathy:    He has no cervical adenopathy.  Neurological: He is alert. Gait normal.  Skin: Skin is warm and dry. He is not diaphoretic.  Psychiatric: Mood and affect normal.   EKG: Normal sinus rhythm, rate 63, ST elevation in V2 and V3 similar to previous  Assessment/Plan:   Chest pain Patient's symptoms most consistent with stable angina. Slightly atypical in that it occurs on the right side and was exertional and associated shortness of breath. He has risk factors. EKG is stable from previously with inferior ST elevations noted. Patient has no active chest pain. Reportedly has had an MI previously. Doubt VTE given stable vital signs  and recurrent nature. Doubt pulmonary cause given benign exam and stable vital signs. Not consistently musculoskeletal cause. We will refer back to cardiology for further evaluation.  Blood in stool Patient reports intermittent blood in his stool over the last year. Negative FOBT in the office. He'll complete stool cards at home. We'll refer to GI and check a CBC.  Neuropathy ([place]) Suspect this is related to his diabetes. We'll check an A1c today.  Knee pain Suspect osteoarthritis given benign exam. We'll request x-ray records from the walk-in clinic. At this time he has no pain and we will continue to monitor.  Essential hypertension Uncontrolled. Previously was on a 2 drug regimen. Only on lisinopril now. I discussed increasing his lisinopril though he wanted to minimize lab draws. Given potential for CHF with lower extremity swelling and likely CAD we will place him on a beta blocker, metoprolol. Heart rate should be able to tolerate this. He is not in active CHF exacerbation and he has no diagnosed history of CHF. He'll follow up with cardiology as well. He is given return precautions.    Orders Placed This Encounter  Procedures  . CBC  . Comp  Met (CMET)  . B Nat Peptide  . HgB A1c  . TSH  . Ambulatory referral to Cardiology    Referral Priority:  Routine    Referral Type:  Consultation    Referral Reason:  Specialty Services Required    Requested Specialty:  Cardiology    Number of Visits Requested:  1  . Ambulatory referral to Gastroenterology    Referral Priority:  Routine    Referral Type:  Consultation    Referral Reason:  Specialty Services Required    Number of Visits Requested:  1  . EKG 12-Lead    Roger Rumps, MD [place]

## 2016-04-16 NOTE — Assessment & Plan Note (Signed)
Suspect this is related to his diabetes. We'll check an A1c today.

## 2016-04-16 NOTE — Assessment & Plan Note (Addendum)
Suspect osteoarthritis given benign exam. We'll request x-ray records from the walk-in clinic. At this time he has no pain and we will continue to monitor.

## 2016-04-16 NOTE — Assessment & Plan Note (Addendum)
Patient reports intermittent blood in his stool over the last year. Negative FOBT in the office. He'll complete stool cards at home. We'll refer to GI and check a CBC.

## 2016-05-07 ENCOUNTER — Encounter: Payer: Self-pay | Admitting: Cardiology

## 2016-05-07 ENCOUNTER — Ambulatory Visit (INDEPENDENT_AMBULATORY_CARE_PROVIDER_SITE_OTHER): Payer: BLUE CROSS/BLUE SHIELD | Admitting: Cardiology

## 2016-05-07 VITALS — BP 130/90 | HR 64 | Ht 70.0 in | Wt 269.2 lb

## 2016-05-07 DIAGNOSIS — E669 Obesity, unspecified: Secondary | ICD-10-CM | POA: Diagnosis not present

## 2016-05-07 DIAGNOSIS — R0602 Shortness of breath: Secondary | ICD-10-CM | POA: Diagnosis not present

## 2016-05-07 DIAGNOSIS — I10 Essential (primary) hypertension: Secondary | ICD-10-CM | POA: Diagnosis not present

## 2016-05-07 DIAGNOSIS — R079 Chest pain, unspecified: Secondary | ICD-10-CM

## 2016-05-07 NOTE — Patient Instructions (Addendum)
Medication Instructions:  Your physician recommends that you continue on your current medications as directed. Please refer to the Current Medication list given to you today.   Labwork: None ordered  Testing/Procedures: Your physician has requested that you have an echocardiogram. Echocardiography is a painless test that uses sound waves to create images of your heart. It provides your doctor with information about the size and shape of your heart and how well your heart's chambers and valves are working. This procedure takes approximately one hour. There are no restrictions for this procedure.  Date & Time: _____________________________________________________________  [facility]  Your caregiver has ordered a Stress Test with nuclear imaging. The purpose of this test is to evaluate the blood supply to your heart muscle. This procedure is referred to as a "Non-Invasive Stress Test." This is because other than having an IV started in your vein, nothing is inserted or "invades" your body. Cardiac stress tests are done to find areas of poor blood flow to the heart by determining the extent of coronary artery disease (CAD). Some patients exercise on a treadmill, which naturally increases the blood flow to your heart, while others who are  unable to walk on a treadmill due to physical limitations have a pharmacologic/chemical stress agent called Lexiscan . This medicine will mimic walking on a treadmill by temporarily increasing your coronary blood flow.   Please note: these test may take anywhere between 2-4 hours to complete  PLEASE REPORT TO [facility] AT THE FIRST DESK WILL DIRECT YOU WHERE TO GO  Date of Procedure:__Wednesday May 16, 2016 at 08:30AM__________  Arrival Time for Procedure:__Arrive at 08:15AM to register_______  Instructions regarding medication:   __X__ : Hold diabetes medication morning of procedure (Metformin)  __X_:  Hold Metoprolol the  night before procedure and morning of procedure    PLEASE NOTIFY THE OFFICE AT LEAST 24 HOURS IN ADVANCE IF YOU ARE UNABLE TO KEEP YOUR APPOINTMENT.  [phone] AND  PLEASE NOTIFY NUCLEAR MEDICINE AT [facility] AT LEAST 24 HOURS IN ADVANCE IF YOU ARE UNABLE TO KEEP YOUR APPOINTMENT. [phone]  How to prepare for your Myoview test:   Do not eat or drink after midnight  No caffeine for 24 hours prior to test  No smoking 24 hours prior to test.  Your medication may be taken with water.  If your doctor stopped a medication because of this test, do not take that medication.  Ladies, please do not wear dresses.  Skirts or pants are appropriate. Please wear a short sleeve shirt.  No perfume, cologne or lotion.  Wear comfortable walking shoes. No heels!   Follow-Up: Your physician recommends that you schedule a follow-up appointment after testing to review results.  Date & Time: _________________________________________________________   Any Other Special Instructions Will Be Listed Below (If Applicable).     If you need a refill on your cardiac medications before your next appointment, please call your pharmacy.  Echocardiogram An echocardiogram, or echocardiography, uses sound waves (ultrasound) to produce an image of your heart. The echocardiogram is simple, painless, obtained within a short period of time, and offers valuable information to your health care provider. The images from an echocardiogram can provide information such as:  Evidence of coronary artery disease (CAD).  Heart size.  Heart muscle function.  Heart valve function.  Aneurysm detection.  Evidence of a past heart attack.  Fluid buildup around the heart.  Heart muscle thickening.  Assess heart valve function. LET YOUR  HEALTH CARE PROVIDER KNOW ABOUT:  Any allergies you have.  All medicines you are taking, including vitamins, herbs, eye drops, creams, and over-the-counter  medicines.  Previous problems you or members of your family have had with the use of anesthetics.  Any blood disorders you have.  Previous surgeries you have had.  Medical conditions you have.  Possibility of pregnancy, if this applies. BEFORE THE PROCEDURE  No special preparation is needed. Eat and drink normally.  PROCEDURE   In order to produce an image of your heart, gel will be applied to your chest and a wand-like tool (transducer) will be moved over your chest. The gel will help transmit the sound waves from the transducer. The sound waves will harmlessly bounce off your heart to allow the heart images to be captured in real-time motion. These images will then be recorded.  You may need an IV to receive a medicine that improves the quality of the pictures. AFTER THE PROCEDURE You may return to your normal schedule including diet, activities, and medicines, unless your health care provider tells you otherwise.   This information is not intended to replace advice given to you by your health care provider. Make sure you discuss any questions you have with your health care provider.   Document Released: 11/09/2000 Document Revised: 12/03/2014 Document Reviewed: 07/20/2013 Elsevier Interactive Patient Education 2016 [place].   Pharmacologic Stress Electrocardiogram A pharmacologic stress electrocardiogram is a heart (cardiac) test that uses nuclear imaging to evaluate the blood supply to your heart. This test may also be called a pharmacologic stress electrocardiography. Pharmacologic means that a medicine is used to increase your heart rate and blood pressure.  This stress test is done to find areas of poor blood flow to the heart by determining the extent of coronary artery disease (CAD). Some people exercise on a treadmill, which naturally increases the blood flow to the heart. For those people unable to exercise on a treadmill, a medicine is used. This medicine stimulates  your heart and will cause your heart to beat harder and more quickly, as if you were exercising.  Pharmacologic stress tests can help determine:  The adequacy of blood flow to your heart during increased levels of activity in order to clear you for discharge home.  The extent of coronary artery blockage caused by CAD.  Your prognosis if you have suffered a heart attack.  The effectiveness of cardiac procedures done, such as an angioplasty, which can increase the circulation in your coronary arteries.  Causes of chest pain or pressure. LET [facility] CARE PROVIDER KNOW ABOUT:  Any allergies you have.  All medicines you are taking, including vitamins, herbs, eye drops, creams, and over-the-counter medicines.  Previous problems you or members of your family have had with the use of anesthetics.  Any blood disorders you have.  Previous surgeries you have had.  Medical conditions you have.  Possibility of pregnancy, if this applies.  If you are currently breastfeeding. RISKS AND COMPLICATIONS Generally, this is a safe procedure. However, as with any procedure, complications can occur. Possible complications include:  You develop pain or pressure in the following areas:  Chest.  Jaw or neck.  Between your shoulder blades.  Radiating down your left arm.  Headache.  Dizziness or light-headedness.  Shortness of breath.  Increased or irregular heartbeat.  Low blood pressure.  Nausea or vomiting.  Flushing.  Redness going up the arm and slight pain during injection of medicine.  Heart attack (rare).  BEFORE THE PROCEDURE   Avoid all forms of caffeine for 24 hours before your test or as directed by your health care provider. This includes coffee, tea (even decaffeinated tea), caffeinated sodas, chocolate, cocoa, and certain pain medicines.  Follow your health care provider's instructions regarding eating and drinking before the test.  Take your medicines as  directed at regular times with water unless instructed otherwise. Exceptions may include:  If you have diabetes, ask how you are to take your insulin or pills. It is common to adjust insulin dosing the morning of the test.  If you are taking beta-blocker medicines, it is important to talk to your health care provider about these medicines well before the date of your test. Taking beta-blocker medicines may interfere with the test. In some cases, these medicines need to be changed or stopped 24 hours or more before the test.  If you wear a nitroglycerin patch, it may need to be removed prior to the test. Ask your health care provider if the patch should be removed before the test.  If you use an inhaler for any breathing condition, bring it with you to the test.  If you are an outpatient, bring a snack so you can eat right after the stress phase of the test.  Do not smoke for 4 hours prior to the test or as directed by your health care provider.  Do not apply lotions, powders, creams, or oils on your chest prior to the test.  Wear comfortable shoes and clothing. Let your health care provider know if you were unable to complete or follow the preparations for your test. PROCEDURE   Multiple patches (electrodes) will be put on your chest. If needed, small areas of your chest may be shaved to get better contact with the electrodes. Once the electrodes are attached to your body, multiple wires will be attached to the electrodes, and your heart rate will be monitored.  An IV access will be started. A nuclear trace (isotope) is given. The isotope may be given intravenously, or it may be swallowed. Nuclear refers to several types of radioactive isotopes, and the nuclear isotope lights up the arteries so that the nuclear images are clear. The isotope is absorbed by your body. This results in low radiation exposure.  A resting nuclear image is taken to show how your heart functions at rest.  A  medicine is given through the IV access.  A second scan is done about 1 hour after the medicine injection and determines how your heart functions under stress.  During this stress phase, you will be connected to an electrocardiogram machine. Your blood pressure and oxygen levels will be monitored. AFTER THE PROCEDURE   Your heart rate and blood pressure will be monitored after the test.  You may return to your normal schedule, including diet,activities, and medicines, unless your health care provider tells you otherwise.   This information is not intended to replace advice given to you by your health care provider. Make sure you discuss any questions you have with your health care provider.   Document Released: 03/31/2009 Document Revised: 11/17/2013 Document Reviewed: 07/20/2013 Elsevier Interactive Patient Education [employer].

## 2016-05-07 NOTE — Progress Notes (Signed)
Cardiology Office Note   Date:  05/07/2016   ID:  Roger [name], DOB Sep 02, 1956, MRN [id number]  Referring Doctor:  [name], MD   Cardiologist:   Wende Bushy, MD   Reason for consultation:  Chief Complaint  Patient presents with  . other    Chest pain and edema left leg/foot. Meds reviewed verbally.      History of Present Illness: Roger [name] is a 60 y.o. male who presents for  Chest pain. Patient describes this as a heaviness in the chest associated with shortness of breath. Mainly located in the center of the chest, nonradiating. Mild in intensity, lasting minutes at a time, resolved with rest. This does not  Bother him the most because what bothers him right now is issues with joints and gout.   he reports being told  That he has had a heart attack in the past based on an EKG. Inferior Q waves noted. He eventually was sent to do in 2014 during which time he underwent stress testing. Does not recall what was done for him. Review of care everywhere shows stress test October 2014 showing  Evidence for mild distal anterior, anteroseptal and periapical ischemia s/p regadenoson stress. - LVEF = 52%. Mild apical hypokinesis with otherwise normal systolic function.   he had no clear follow-up with cardiology after that time.   Currently, main complaint is joint pains. No fever, cough, colds, occasional abdominal pain. No orthopnea, PND. 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     ROS:  Please see the history of present illness. Aside from mentioned under HPI, all other systems are reviewed and negative.     Past Medical History  Diagnosis Date  . Hypertension   . Hematochezia   . MI (myocardial infarction) [facility])     Past Surgical History  Procedure Laterality Date  . Thumb surgery       reports that he has never smoked. He has never used smokeless tobacco. He reports that he drinks alcohol. He reports that he does not use illicit  drugs.   family history includes Heart Problems in his mother.   Current Outpatient Prescriptions  Medication Sig Dispense Refill  . allopurinol (ZYLOPRIM) 300 MG tablet Take 300 mg by mouth daily.    . baclofen (LIORESAL) 10 MG tablet Take 1 tablet by mouth 3 (three) times daily.    Marland Kitchen ibuprofen (ADVIL,MOTRIN) 800 MG tablet Take 1 tablet (800 mg total) by mouth every 8 (eight) hours as needed. 30 tablet 0  . lisinopril (PRINIVIL,ZESTRIL) 20 MG tablet Take 1 tablet by mouth as needed. Reported on 05/07/2016    . metFORMIN (GLUCOPHAGE) 500 MG tablet Take 500 mg by mouth 2 (two) times daily with a meal.    . metoprolol tartrate (LOPRESSOR) 25 MG tablet Take 1 tablet (25 mg total) by mouth 2 (two) times daily. (Patient taking differently: Take 25 mg by mouth as needed. ) 180 tablet 1  . pantoprazole (PROTONIX) 40 MG tablet Take 1 tablet (40 mg total) by mouth daily. 30 tablet 1  . tadalafil (CIALIS) 10 MG tablet Take 10 mg by mouth daily as needed for erectile dysfunction.     No current facility-administered medications for this visit.    Allergies: Review of patient's allergies indicates no known allergies.    PHYSICAL EXAM: VS:  BP 130/90 mmHg  Pulse 64  Ht 5\' 10"  (1.778 m)  Wt 269 lb 4 oz (122.131 kg)  BMI 38.63 kg/m2 , Body mass index is 38.63 kg/(m^2). Wt Readings from Last 3 Encounters:  05/07/16 269 lb 4 oz (122.131 kg)  04/16/16 274 lb (124.286 kg)  11/11/15 270 lb (122.471 kg)    GENERAL:  well developed, well nourished, obese, not in acute distress HEENT: normocephalic, pink conjunctivae, anicteric sclerae, no xanthelasma, normal dentition, oropharynx clear NECK:  no neck vein engorgement, JVP normal, no hepatojugular reflux, carotid upstroke brisk and symmetric, no bruit, no thyromegaly, no lymphadenopathy LUNGS:  good respiratory effort, clear to auscultation bilaterally CV:  PMI not displaced, no thrills, no lifts, S1 and S2 within normal limits, no palpable S3 or S4, no  murmurs, no rubs, no gallops ABD:  Soft, nontender, nondistended, normoactive bowel sounds, no abdominal aortic bruit, no hepatomegaly, no splenomegaly MS: nontender back, no kyphosis, no scoliosis, no joint deformities EXT:  2+ DP/PT pulses,  +1 edema, + varicosities, no cyanosis, no clubbing SKIN: warm, nondiaphoretic, normal turgor, no ulcers NEUROPSYCH: alert, oriented to person, place, and time, sensory/motor grossly intact, normal mood, appropriate affect  Recent Labs: 04/16/2016: ALT 23; BUN 27*; Creatinine, Ser 1.27; Hemoglobin 12.2*; Platelets 278.0; Potassium 4.0; Pro B Natriuretic peptide (BNP) 22.0; Sodium 137; TSH 2.78   Lipid Panel No results found for: CHOL, TRIG, HDL, CHOLHDL, VLDL, LDLCALC, LDLDIRECT   Other studies Reviewed:  EKG:  The ekg from  05/07/2016 was personally reviewed by me and it revealed  Sinus rhythm, 64 BPM  Additional studies/ records that were reviewed personally reviewed by me today include:   none available   ASSESSMENT AND PLAN:  chest pain  shortness of breath  risk factors for CAD include hypertension, diabetes, family history of premature CAD,  History of prior tobacco use. Patient used to see cardiology but no clear follow-up and no available medical records. Recommend further evaluation with pharmacologic nuclear stress test as patient cannot walk on the treadmill. Recommend echocardiogram as well.   Hypertension BP is well controlled. Continue monitoring BP. Continue current medical therapy and lifestyle changes.  Obesity Body mass index is 38.63 kg/(m^2).Marland Kitchen Recommend aggressive weight loss through diet and increased physical activity.  Once cardiac workup is done   because of history of diabetes, LDL goal is less than 70.     Current medicines are reviewed at length with the patient today.  The patient does not have concerns regarding medicines.  Labs/ tests ordered today include:  Orders Placed This Encounter  Procedures  . NM  Myocar Multi W/Spect W/Wall Motion / EF  . EKG 12-Lead  . ECHOCARDIOGRAM COMPLETE    I had a lengthy and detailed discussion with the patient regarding diagnoses, prognosis, diagnostic options, treatment options.   I counseled the patient on importance of lifestyle modification including heart healthy diet, regular physical activity  Once cardiac workup is done   Disposition:   FU with undersigned after tests   Signed, Wende Bushy, MD  05/07/2016 2:04 PM    [place]

## 2016-05-16 ENCOUNTER — Other Ambulatory Visit: Payer: Self-pay

## 2016-05-16 ENCOUNTER — Encounter
Admission: RE | Admit: 2016-05-16 | Discharge: 2016-05-16 | Disposition: A | Discharge status: Home / Self Care | Payer: BLUE CROSS/BLUE SHIELD | Source: Ambulatory Visit | Attending: Cardiology | Admitting: Cardiology

## 2016-05-16 ENCOUNTER — Ambulatory Visit (INDEPENDENT_AMBULATORY_CARE_PROVIDER_SITE_OTHER): Payer: BLUE CROSS/BLUE SHIELD

## 2016-05-16 DIAGNOSIS — R079 Chest pain, unspecified: Secondary | ICD-10-CM

## 2016-05-16 HISTORY — DX: Type 2 diabetes mellitus without complications: E11.9

## 2016-05-16 LAB — ECHOCARDIOGRAM COMPLETE
E decel time: 229 msec
EERAT: 11.9
FS: 44 % (ref 28–44)
IVS/LV PW RATIO, ED: 0.97
LA ID, A-P, ES: 40 mm
LA vol A4C: 62 ml
LA vol index: 27 mL/m2
LADIAMINDEX: 1.69 cm/m2
LAVOL: 64 mL
LEFT ATRIUM END SYS DIAM: 40 mm
LV SIMPSON'S DISK: 60
LV TDI E'LATERAL: 7.62
LV TDI E'MEDIAL: 5.66
LV dias vol index: 55 mL/m2
LV sys vol index: 22 mL/m2
LV sys vol: 53 mL (ref 21–61)
LVDIAVOL: 130 mL (ref 62–150)
LVEEAVG: 11.9
LVEEMED: 11.9
LVELAT: 7.62 cm/s
LVOT SV: 93 mL
LVOT VTI: 22.3 cm
LVOT area: 4.15 cm2
LVOT peak vel: 108 cm/s
LVOTD: 23 mm
MV Dec: 229
MVPG: 3 mmHg
MVPKAVEL: 81.5 m/s
MVPKEVEL: 90.7 m/s
PW: 12 mm — AB (ref 0.6–1.1)
Stroke v: 77 ml
TAPSE: 20.9 mm

## 2016-05-16 MED ORDER — TECHNETIUM TC 99M TETROFOSMIN IV KIT
30.0000 | PACK | Freq: Once | INTRAVENOUS | Status: AC | PRN
  Administered 2016-05-16: 32.811 via INTRAVENOUS

## 2016-05-16 MED ORDER — TECHNETIUM TC 99M TETROFOSMIN IV KIT
13.0000 | PACK | Freq: Once | INTRAVENOUS | Status: AC | PRN
  Administered 2016-05-16: 12.636 via INTRAVENOUS

## 2016-05-16 MED ORDER — REGADENOSON 0.4 MG/5ML IV SOLN
0.4000 mg | Freq: Once | INTRAVENOUS | Status: AC
  Administered 2016-05-16: 0.4 mg via INTRAVENOUS

## 2016-05-18 ENCOUNTER — Encounter: Payer: Self-pay | Admitting: Family Medicine

## 2016-05-18 ENCOUNTER — Encounter: Payer: Self-pay | Admitting: Gastroenterology

## 2016-05-18 ENCOUNTER — Ambulatory Visit (INDEPENDENT_AMBULATORY_CARE_PROVIDER_SITE_OTHER): Payer: BLUE CROSS/BLUE SHIELD | Admitting: Family Medicine

## 2016-05-18 VITALS — BP 132/80 | HR 70 | Temp 98.2°F | Ht 70.0 in | Wt 277.2 lb

## 2016-05-18 DIAGNOSIS — R079 Chest pain, unspecified: Secondary | ICD-10-CM

## 2016-05-18 DIAGNOSIS — E119 Type 2 diabetes mellitus without complications: Secondary | ICD-10-CM | POA: Insufficient documentation

## 2016-05-18 DIAGNOSIS — M109 Gout, unspecified: Secondary | ICD-10-CM | POA: Diagnosis not present

## 2016-05-18 DIAGNOSIS — I10 Essential (primary) hypertension: Secondary | ICD-10-CM

## 2016-05-18 DIAGNOSIS — K921 Melena: Secondary | ICD-10-CM | POA: Diagnosis not present

## 2016-05-18 DIAGNOSIS — R109 Unspecified abdominal pain: Secondary | ICD-10-CM | POA: Insufficient documentation

## 2016-05-18 DIAGNOSIS — K30 Functional dyspepsia: Secondary | ICD-10-CM

## 2016-05-18 DIAGNOSIS — N529 Male erectile dysfunction, unspecified: Secondary | ICD-10-CM | POA: Insufficient documentation

## 2016-05-18 DIAGNOSIS — E1159 Type 2 diabetes mellitus with other circulatory complications: Secondary | ICD-10-CM

## 2016-05-18 MED ORDER — COLCHICINE 0.6 MG PO TABS: 0.6000 mg | ORAL_TABLET | Freq: Every day | ORAL | Status: DC

## 2016-05-18 MED ORDER — TADALAFIL 10 MG PO TABS: 10.0000 mg | ORAL_TABLET | Freq: Every day | ORAL | Status: DC | PRN

## 2016-05-18 NOTE — Assessment & Plan Note (Signed)
Patient notes several days of stomach discomfort several weeks ago with also left testicular discomfort. This resolved and only occurred at night. No recurrence. Benign exam today. No other symptoms with this. Discussed continuing to monitor and if recurs he'll need further evaluation.

## 2016-05-18 NOTE — Assessment & Plan Note (Signed)
Last A1c 8. Patient reports today that he is not consistently taking his metformin. I encouraged him to take metformin twice a day. And we will continue to monitor. Foot exam completed today.

## 2016-05-18 NOTE — Assessment & Plan Note (Signed)
No recurrence. Low risk stress test. We will continue to monitor. Given return precautions.

## 2016-05-18 NOTE — Progress Notes (Signed)
Pre visit review using our clinic review tool, if applicable. No additional management support is needed unless otherwise documented below in the visit note. 

## 2016-05-18 NOTE — Assessment & Plan Note (Addendum)
Patient with continued issues with gout. Has been taking anti-inflammatories that have helped some. Unable to take allopurinol given that he is having continued exacerbation at this time. Has been taking ibuprofen with some benefit. In the past colchicine has been most beneficial. We will try to get colchicine filled for him. We will refer to rheumatology given recurrent issues with this. He is given return precautions.

## 2016-05-18 NOTE — Assessment & Plan Note (Signed)
At goal. Discussed continuing to take medications more consistently.

## 2016-05-18 NOTE — Patient Instructions (Signed)
Nice to see you. I'm glad you're stress test was low risk. We will try you on colchicine for your gout. We'll refer you to her rheumatologist. We will try Cialis for erectile dysfunction. If you develop chest pain or shortness of breath during sexual activity please discontinue and call EMS. If you ever have to call 911 or go to the hospital you need to inform them if you have taken the Cialis in the last 24-48 hours. If you develop chest pain, shortness of breath, fevers, joint swelling, or any new or changing symptoms please seek medical attention.

## 2016-05-18 NOTE — Assessment & Plan Note (Signed)
No recurrence. We'll have patient see GI.

## 2016-05-18 NOTE — Assessment & Plan Note (Addendum)
Normal genital exam today. Given patient had low risk stress test discussed trialing Cialis. Advised to monitor for lightheadedness. Discussed if you develop chest pain or shortness of breath or sweatiness well engaging in sexual activity he needs to stop and call 911. Discussed that if he needed to call 911 or go to the emergency room he needed to inform them he was taking Cialis. We'll continue to monitor. If not beneficial could consider urology referral.

## 2016-05-18 NOTE — Progress Notes (Signed)
Patient ID: Roger [name], male   DOB: [birth year], 58 y.o.   MRN: [id number]  [name], MD Phone: [phone]  Roger [name] is a 60 y.o. male who presents today for follow-up.  HYPERTENSION Disease Monitoring Home BP Monitoring not checking Chest pain- no    Dyspnea- no Medications Compliance-  intermittently takes lisinopril and metoprolol.  Edema- minimal, just had an echo with his stress test that revealed normal EF  Gout: Patient notes he is continuing to have issues with gout since seeing [place] last time. The issue being in his left knee where he could barely touch the knee. This is improved. Then would go to his left middle toe and the toe with swelling up and could barely touch it with the sock or sheet. Notes that started going down on Sunday. He has been off of crutches since then. Notes his son stepped on that toe and that helped make it better after it initially hurt significantly. Not taking colchicine or indomethacin at this time. Has been taking ibuprofen and this does help.  Has not had any recurrence of chest pain. He had a low risk stress test recently. Has not seen GI or completed the stool cards for the blood in the stool.  Erectile dysfunction: Patient notes this has been going on for a while. No erections at night. Some erections with stimulation. Occasionally has issues ejaculating. Has tried Cialis before and was somewhat beneficial.  Diabetes: Last A1c was noted to be 8. Patient notes he is not really taking his metformin but once or twice a week. No urinary issues.  Patient additionally notes several weeks ago he felt as though he had gout in his lower stomach on the left side and his left testicle. This only hurt him at night. Resolved after 3 days. Has not recurred. No testicular pain now. No dysuria or urinary frequency. No abdominal pain at this time. No nausea, vomiting, or diarrhea with this.   PMH: nonsmoker.   ROS see history of present  illness  Objective  Physical Exam Filed Vitals:   05/18/16 0919  BP: 132/80  Pulse: 70  Temp: 98.2 F (36.8 C)    BP Readings from Last 3 Encounters:  05/18/16 132/80  05/07/16 130/90  04/16/16 156/98   Wt Readings from Last 3 Encounters:  05/18/16 277 lb 3.2 oz (125.737 kg)  05/07/16 269 lb 4 oz (122.131 kg)  04/16/16 274 lb (124.286 kg)    Physical Exam  Constitutional: He is well-developed, well-nourished, and in no distress.  HENT:  Head: Normocephalic and atraumatic.  Right Ear: External ear normal.  Left Ear: External ear normal.  Cardiovascular: Normal rate, regular rhythm and normal heart sounds.   Pulmonary/Chest: Effort normal and breath sounds normal.  Abdominal: Soft. Bowel sounds are normal. He exhibits no distension. There is no tenderness. There is no rebound and no guarding.  Genitourinary:  Normal penis, normal testicles, normal scrotum, normal vas deferens, normal epididymis, no inguinal hernias  Musculoskeletal:  Bilateral knees no swelling, warmth, or erythema, no ligamentous laxity, right minimally tender to palpation on the joint lines, left nontender, left middle toe minimally swollen with no tenderness or warmth or erythema, bilateral ankles with mild edema though no pitting edema in his legs  Neurological: He is alert. Gait normal.  Skin: Skin is warm and dry. He is not diaphoretic.  Bilateral feet with no lesions noted, 2+ DP pulses, sensation light touch intact in bilateral feet, monofilament left greater than right  Assessment/Plan: Please see individual problem list.  Gout Patient with continued issues with gout. Has been taking anti-inflammatories that have helped some. Unable to take allopurinol given that he is having continued exacerbation at this time. Has been taking ibuprofen with some benefit. In the past colchicine has been most beneficial. We will try to get colchicine filled for him. We will refer to rheumatology given recurrent  issues with this. He is given return precautions.  Essential hypertension At goal. Discussed continuing to take medications more consistently.  Blood in stool No recurrence. We'll have patient see GI.  Chest pain No recurrence. Low risk stress test. We will continue to monitor. Given return precautions.  Erectile dysfunction Normal genital exam today. Given patient had low risk stress test discussed trialing Cialis. Advised to monitor for lightheadedness. Discussed if you develop chest pain or shortness of breath or sweatiness well engaging in sexual activity he needs to stop and call 911. Discussed that if he needed to call 911 or go to the emergency room he needed to inform them he was taking Cialis. We'll continue to monitor. If not beneficial could consider urology referral.  Stomach discomfort Patient notes several days of stomach discomfort several weeks ago with also left testicular discomfort. This resolved and only occurred at night. No recurrence. Benign exam today. No other symptoms with this. Discussed continuing to monitor and if recurs he'll need further evaluation.  Diabetes ([place]) Last A1c 8. Patient reports today that he is not consistently taking his metformin. I encouraged him to take metformin twice a day. And we will continue to monitor. Foot exam completed today.    Orders Placed This Encounter  Procedures  . Ambulatory referral to Rheumatology    Referral Priority:  Routine    Referral Type:  Consultation    Referral Reason:  Specialty Services Required    Requested Specialty:  Rheumatology    Number of Visits Requested:  1  . Ambulatory referral to Gastroenterology    Referral Priority:  Routine    Referral Type:  Consultation    Referral Reason:  Specialty Services Required    Number of Visits Requested:  1    Meds ordered this encounter  Medications  . tadalafil (CIALIS) 10 MG tablet    Sig: Take 1 tablet (10 mg total) by mouth daily as needed for  erectile dysfunction.    Dispense:  5 tablet    Refill:  0  . colchicine 0.6 MG tablet    Sig: Take 1 tablet (0.6 mg total) by mouth daily.    Dispense:  30 tablet    Refill:  0     [name], MD [place]

## 2016-05-30 ENCOUNTER — Encounter: Payer: Self-pay | Admitting: *Deleted

## 2016-05-30 ENCOUNTER — Ambulatory Visit: Payer: BLUE CROSS/BLUE SHIELD | Admitting: Cardiology

## 2016-05-30 DIAGNOSIS — R0989 Other specified symptoms and signs involving the circulatory and respiratory systems: Secondary | ICD-10-CM

## 2016-05-31 ENCOUNTER — Telehealth: Payer: Self-pay

## 2016-05-31 NOTE — Telephone Encounter (Signed)
PA for Cialis on Cover My meds

## 2016-06-22 ENCOUNTER — Ambulatory Visit: Payer: BLUE CROSS/BLUE SHIELD | Admitting: Family Medicine

## 2016-07-13 ENCOUNTER — Ambulatory Visit: Payer: BLUE CROSS/BLUE SHIELD | Admitting: Gastroenterology

## 2016-09-10 ENCOUNTER — Telehealth: Payer: Self-pay | Admitting: Family Medicine

## 2016-09-10 NOTE — Telephone Encounter (Signed)
Pt called and was asking for a refill on Colcrys 6 mg, and ibuprofen (ADVIL,MOTRIN) 800 MG tablet . Patient stated that he has a gout flare up.Please advise, thank you!  Call pt @ [place], [place]

## 2016-09-10 NOTE — Telephone Encounter (Signed)
Agree patient should be evaluated.

## 2016-09-10 NOTE — Telephone Encounter (Signed)
Patient has not been seen since 04/2016. Scheduled patient to come in for appointment on 09/11/16.

## 2016-09-11 ENCOUNTER — Encounter: Payer: Self-pay | Admitting: Family Medicine

## 2016-09-11 ENCOUNTER — Ambulatory Visit (INDEPENDENT_AMBULATORY_CARE_PROVIDER_SITE_OTHER): Payer: BLUE CROSS/BLUE SHIELD

## 2016-09-11 ENCOUNTER — Encounter: Payer: Self-pay | Admitting: Surgical

## 2016-09-11 ENCOUNTER — Ambulatory Visit
Admission: RE | Admit: 2016-09-11 | Discharge: 2016-09-11 | Disposition: A | Discharge status: Home / Self Care | Payer: BLUE CROSS/BLUE SHIELD | Source: Ambulatory Visit | Attending: Family Medicine | Admitting: Family Medicine

## 2016-09-11 ENCOUNTER — Ambulatory Visit (INDEPENDENT_AMBULATORY_CARE_PROVIDER_SITE_OTHER): Payer: BLUE CROSS/BLUE SHIELD | Admitting: Family Medicine

## 2016-09-11 ENCOUNTER — Encounter (INDEPENDENT_AMBULATORY_CARE_PROVIDER_SITE_OTHER): Payer: Self-pay

## 2016-09-11 ENCOUNTER — Telehealth: Payer: Self-pay | Admitting: Family Medicine

## 2016-09-11 VITALS — BP 144/88 | HR 61 | Temp 98.0°F | Wt 275.4 lb

## 2016-09-11 DIAGNOSIS — M79605 Pain in left leg: Secondary | ICD-10-CM | POA: Diagnosis not present

## 2016-09-11 DIAGNOSIS — M109 Gout, unspecified: Secondary | ICD-10-CM

## 2016-09-11 DIAGNOSIS — K602 Anal fissure, unspecified: Secondary | ICD-10-CM | POA: Insufficient documentation

## 2016-09-11 DIAGNOSIS — R079 Chest pain, unspecified: Secondary | ICD-10-CM

## 2016-09-11 DIAGNOSIS — M7989 Other specified soft tissue disorders: Secondary | ICD-10-CM | POA: Insufficient documentation

## 2016-09-11 DIAGNOSIS — Z5181 Encounter for therapeutic drug level monitoring: Secondary | ICD-10-CM

## 2016-09-11 DIAGNOSIS — R202 Paresthesia of skin: Secondary | ICD-10-CM

## 2016-09-11 LAB — COMPREHENSIVE METABOLIC PANEL
ALT: 19 U/L (ref 0–53)
AST: 13 U/L (ref 0–37)
Albumin: 4 g/dL (ref 3.5–5.2)
Alkaline Phosphatase: 84 U/L (ref 39–117)
BILIRUBIN TOTAL: 0.4 mg/dL (ref 0.2–1.2)
BUN: 26 mg/dL — ABNORMAL HIGH (ref 6–23)
CALCIUM: 9.7 mg/dL (ref 8.4–10.5)
CO2: 30 meq/L (ref 19–32)
CREATININE: 1.61 mg/dL — AB (ref 0.40–1.50)
Chloride: 103 mEq/L (ref 96–112)
GFR: 56.91 mL/min — ABNORMAL LOW (ref >60.00)
Glucose, Bld: 148 mg/dL — ABNORMAL HIGH (ref 70–99)
Potassium: 4.4 mEq/L (ref 3.5–5.1)
Sodium: 140 mEq/L (ref 135–145)
TOTAL PROTEIN: 7.1 g/dL (ref 6.0–8.3)

## 2016-09-11 LAB — D-DIMER, QUANTITATIVE: D-Dimer, Quant: 0.35 mcg/mL FEU (ref <0.50)

## 2016-09-11 MED ORDER — ALLOPURINOL 300 MG PO TABS: 300.0000 mg | ORAL_TABLET | Freq: Every day | ORAL | 3 refills | Status: DC

## 2016-09-11 MED ORDER — COLCHICINE 0.6 MG PO TABS: 0.6000 mg | ORAL_TABLET | Freq: Every day | ORAL | 0 refills | Status: DC

## 2016-09-11 MED ORDER — IBUPROFEN 800 MG PO TABS: 800.0000 mg | ORAL_TABLET | Freq: Three times a day (TID) | ORAL | 0 refills | Status: DC | PRN

## 2016-09-11 NOTE — Assessment & Plan Note (Addendum)
Patient with intermittent numbness in his posterior left lower extremity. Could suspect this would be related to nerve impingement in the back given the isolated distribution to the S1 nerve root. No noted back pain. Neurologically intact in his lower extremities. Normal rectal tone and perineal sensation. We'll obtain an x-ray of his back to evaluate for potential cause. Could consider MRI in the future. Given return precautions.

## 2016-09-11 NOTE — Telephone Encounter (Signed)
[facility] called with D-Dimer results of 0.35.

## 2016-09-11 NOTE — Patient Instructions (Addendum)
Nice to see you. We are going to refill your gout medicines. We are going to get an ultrasound of your left lower extremity to evaluate for DVT. We're getting an x-ray of your back. We are going to have you follow up with your cardiologist as well. If you develop persistent numbness, increased pain, numbness between her legs, loss of bowel or bladder function, chest pain, shortness of breath, or any new or changing symptoms please seek medical attention immediately.

## 2016-09-11 NOTE — Assessment & Plan Note (Signed)
History of pain with bowel movements and discomfort on rectal exam would argue for rectal fissure. No obvious fissure on exam. We will refer to general surgery for evaluation.

## 2016-09-11 NOTE — Assessment & Plan Note (Signed)
Patient with recent gout flare. Appears to be recovering at this time. We'll refill colchicine for him to have on hand. Also refill ibuprofen. Refill allopurinol. We'll check his kidney function today given these medications.

## 2016-09-11 NOTE — Assessment & Plan Note (Addendum)
Patient with an episode of chest pain last week. Somewhat typical and atypical features. He's had a recent stress test that was overall low risk though did have a small defect noted. EKG today fairly similar to prior EKG from our office with flattening of T waves in leads 1 and aVL. Given he is asymptomatic over the last 4 days I do not think this represents an acute change. Given constant pain for 3 days and somewhat atypical nature with EKG similar to previously with recent reassuring workup doubt cardiac cause at this time. More concerning cause would be PE given left lower extremity pain and swelling. Vital signs are stable with no hypoxia or tachycardia. Given stable vital signs and lack of persistent chest pain symptoms my pretest probability is low and thus we'll order d-dimer to rule out PE. We will additionally order a ultrasound of his left lower extremity to evaluate for DVT. We will contact the patient with the results. He will follow-up with his cardiologist tomorrow. He is given return precautions.

## 2016-09-11 NOTE — Progress Notes (Addendum)
[name], MD Phone: [phone]  Roger [name] is a 60 y.o. male who presents today for same-day visit.  Patient notes over the last week or so he's had a gouty attack in his left first MTP joint. Notes it is progressively improving. Was inflamed to the point that he could not put his shoes on. It is 80% better now. Needs a refill on his colchicine, ibuprofen, and allopurinol.  Patient additionally notes left thigh and calf pain over the last week. Notes it tightens up laterally and feels as though it is pulling. Feels like a cramp. No injury. No recent surgeries or immobility. Notes it occurs nightly and in the morning. Better during the day. He notes some numbness down the back of his leg particularly when he goes from seated to standing. Notes it is numb about 50% of the time. No back pain. No saddle anesthesia. No fevers. Does note a few stool stains in his underwear though no bowel movements and no significant loss of bowel or bladder function. Does note some swelling in his left calf and thigh. 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 Notes these are stable. Does note for 3 days last week he had a right sided chest discomfort that was described as tightness. No shortness of breath or radiation. He got mildly diaphoretic at one point with it. Did have an exertional component. Has not had any in the last 4 days. No discomfort at this time. Did have a stress test that had a small defect of mild severity in the apex and was read as a low risk stress test. Also had an echo with no wall motion abnormalities recently. Has not followed up with cardiology.  Patient additionally notes after the physical exam that he has had some pain with defecation intermittently for some time. Notes it feels like there is a sharp discomfort in his rectum when he defecates at times.  PMH: nonsmoker.   ROS see history of present illness  Objective  Physical Exam Vitals:   09/11/16 0829    BP: (!) 144/88  Pulse: 61  Temp: 98 F (36.7 C)    BP Readings from Last 3 Encounters:  09/11/16 (!) 144/88  05/18/16 132/80  05/07/16 130/90   Wt Readings from Last 3 Encounters:  09/11/16 275 lb 6 oz (124.9 kg)  05/18/16 277 lb 3.2 oz (125.7 kg)  05/07/16 269 lb 4 oz (122.1 kg)    Physical Exam  Constitutional: No distress.  Cardiovascular: Normal rate, regular rhythm and normal heart sounds.   Bilateral feet are warm and well-perfused  Pulmonary/Chest: Effort normal and breath sounds normal.  Genitourinary:  Genitourinary Comments: Patient with good rectal tone on rectal exam, he had discomfort with insertion of the finger on the rectal exam, there were no obvious fissures or fistulas on rectal exam, no stool on the glove, normal perineal sensation  Musculoskeletal:  No midline spine tenderness, no midline spine step-off, no muscular back tenderness, left lower extremity swollen compared to right with left calf measuring 41.5 cm in right calf measuring 39 cm, there are scattered varicose veins in the left lower extremity, there is no tenderness or cords of the calf, there is tenderness of the posterior thigh in the midportion, left foot first MTP joint with no tenderness, swelling, warmth, or erythema  Neurological: He is alert. Gait normal.  5 out of 5 strength bilateral quads, hamstrings, plantar flexion, and dorsiflexion, sensation to light touch intact in bilateral lower extremities  Skin: Skin is warm and dry. He is not diaphoretic.   EKG: Sinus bradycardia, rate 54, flattened T-wave in I and aVL slightly different than previously, other findings similar to prior EKG from our office though ST segment findings more pronounced than prior EKG from the cardiology office  Assessment/Plan: Please see individual problem list.  Chest pain Patient with an episode of chest pain last week. Somewhat typical and atypical features. He's had a recent stress test that was overall low  risk though did have a small defect noted. EKG today fairly similar to prior EKG from our office with flattening of T waves in leads 1 and aVL. Given he is asymptomatic over the last 4 days I do not think this represents an acute change. Given constant pain for 3 days and somewhat atypical nature with EKG similar to previously with recent reassuring workup doubt cardiac cause at this time. More concerning cause would be PE given left lower extremity pain and swelling. Vital signs are stable with no hypoxia or tachycardia. Given stable vital signs and lack of persistent chest pain symptoms my pretest probability is low and thus we'll order d-dimer to rule out PE. We will additionally order a ultrasound of his left lower extremity to evaluate for DVT. We will contact the patient with the results. He will follow-up with his cardiologist tomorrow. He is given return precautions.  Gout Patient with recent gout flare. Appears to be recovering at this time. We'll refill colchicine for him to have on hand. Also refill ibuprofen. Refill allopurinol. We'll check his kidney function today given these medications.  Rectal fissure History of pain with bowel movements and discomfort on rectal exam would argue for rectal fissure. No obvious fissure on exam. We will refer to general surgery for evaluation.  Paresthesia of left lower extremity Patient with intermittent numbness in his posterior left lower extremity. Could suspect this would be related to nerve impingement in the back given the isolated distribution to the S1 nerve root. No noted back pain. Neurologically intact in his lower extremities. Normal rectal tone and perineal sensation. We'll obtain an x-ray of his back to evaluate for potential cause. Could consider MRI in the future. Given return precautions.   Orders Placed This Encounter  Procedures  . US Venous Img Lower Unilateral Left    Standing Status:   Future    Number of Occurrences:   1     Standing Expiration Date:   11/11/2017    Order Specific Question:   Reason for Exam (SYMPTOM  OR DIAGNOSIS REQUIRED)    Answer:   rule out DVT, LLE swelling and pain for 1 week    Order Specific Question:   Preferred imaging location?    Answer:   [facility]    Order Specific Question:   Call Results- Best Contact Number?    Answer:   [phone]  . DG Lumbar Spine Complete    Standing Status:   Future    Number of Occurrences:   1    Standing Expiration Date:   11/11/2017    Order Specific Question:   Reason for Exam (SYMPTOM  OR DIAGNOSIS REQUIRED)    Answer:   paresthesias down posterior left leg    Order Specific Question:   Preferred imaging location?    Answer:   [facility]  . Comp Met (CMET)  . D-Dimer, Quantitative  . Ambulatory referral to General Surgery    Referral Priority:   Routine  Referral Type:   Surgical    Referral Reason:   Specialty Services Required    Requested Specialty:   General Surgery    Number of Visits Requested:   1  . EKG 12-Lead    Meds ordered this encounter  Medications  . allopurinol (ZYLOPRIM) 300 MG tablet    Sig: Take 1 tablet (300 mg total) by mouth daily.    Dispense:  90 tablet    Refill:  3  . colchicine 0.6 MG tablet    Sig: Take 1 tablet (0.6 mg total) by mouth daily.    Dispense:  30 tablet    Refill:  0  . ibuprofen (ADVIL,MOTRIN) 800 MG tablet    Sig: Take 1 tablet (800 mg total) by mouth every 8 (eight) hours as needed.    Dispense:  30 tablet    Refill:  0    [name], MD [place]

## 2016-09-12 ENCOUNTER — Ambulatory Visit (INDEPENDENT_AMBULATORY_CARE_PROVIDER_SITE_OTHER): Payer: BLUE CROSS/BLUE SHIELD | Admitting: Cardiology

## 2016-09-12 ENCOUNTER — Encounter: Payer: Self-pay | Admitting: Cardiology

## 2016-09-12 ENCOUNTER — Encounter: Payer: Self-pay | Admitting: *Deleted

## 2016-09-12 VITALS — BP 140/90 | HR 77 | Ht 70.0 in | Wt 275.8 lb

## 2016-09-12 DIAGNOSIS — I10 Essential (primary) hypertension: Secondary | ICD-10-CM | POA: Diagnosis not present

## 2016-09-12 DIAGNOSIS — R0602 Shortness of breath: Secondary | ICD-10-CM | POA: Diagnosis not present

## 2016-09-12 DIAGNOSIS — Z6839 Body mass index (BMI) 39.0-39.9, adult: Secondary | ICD-10-CM

## 2016-09-12 DIAGNOSIS — E6609 Other obesity due to excess calories: Secondary | ICD-10-CM | POA: Diagnosis not present

## 2016-09-12 DIAGNOSIS — R9439 Abnormal result of other cardiovascular function study: Secondary | ICD-10-CM | POA: Diagnosis not present

## 2016-09-12 MED ORDER — ASPIRIN EC 81 MG PO TBEC: 81.0000 mg | DELAYED_RELEASE_TABLET | Freq: Every day | ORAL | 3 refills | Status: DC

## 2016-09-12 MED ORDER — ISOSORBIDE MONONITRATE ER 30 MG PO TB24: 30.0000 mg | ORAL_TABLET | Freq: Every day | ORAL | 6 refills | Status: DC

## 2016-09-12 NOTE — Progress Notes (Signed)
Cardiology Office Note   Date:  09/13/2016   ID:  Roger [name], DOB August 26, 1956, MRN [id number]  Referring Doctor:  [name], MD   Cardiologist:   Wende Bushy, MD   Reason for consultation:  Chief Complaint  Patient presents with  . Hypertension      History of Present Illness: Roger [name] is a 60 y.o. male who presents for  Follow-up after testing   Since last visit, patient has had rare episode of chest pain, but it was mostly on the right side. Last a second or so. Did not bother him at all.   Previously:  Review of care everywhere shows stress test October 2014 showing Evidence for mild distal anterior, anteroseptal and periapical ischemia s/p regadenoson stress.  LVEF = 52%. Mild apical hypokinesis with otherwise normal systolic function.  he had no clear follow-up with cardiology after that time.  He has seen his PCP and is being worked up for cramping and pain on the lower extremities mostly on the left lower extremity, happens at rest.   No loss of consciousness, palpitations, PND, orthopnea, edema.  ROS:  Please see the history of present illness. Aside from mentioned under HPI, all other systems are reviewed and negative.     Past Medical History:  Diagnosis Date  . Diabetes mellitus without complication ([place])   . Hematochezia   . Hypertension   . MI (myocardial infarction)     Past Surgical History:  Procedure Laterality Date  . thumb surgery       reports that he has never smoked. He has never used smokeless tobacco. He reports that he drinks alcohol. He reports that he does not use drugs.   family history includes Heart Problems in his mother.   Current Outpatient Prescriptions  Medication Sig Dispense Refill  . allopurinol (ZYLOPRIM) 300 MG tablet Take 1 tablet (300 mg total) by mouth daily. 90 tablet 3  . colchicine 0.6 MG tablet Take 1 tablet (0.6 mg total) by mouth daily. 30 tablet 0  . ibuprofen (ADVIL,MOTRIN) 800 MG tablet  Take 1 tablet (800 mg total) by mouth every 8 (eight) hours as needed. 30 tablet 0  . lisinopril (PRINIVIL,ZESTRIL) 20 MG tablet Take 1 tablet by mouth as needed. Reported on 05/07/2016    . metFORMIN (GLUCOPHAGE) 500 MG tablet Take 500 mg by mouth 2 (two) times daily with a meal.    . metoprolol tartrate (LOPRESSOR) 25 MG tablet Take 1 tablet (25 mg total) by mouth 2 (two) times daily. 180 tablet 1  . pantoprazole (PROTONIX) 40 MG tablet Take 1 tablet (40 mg total) by mouth daily. 30 tablet 1  . tadalafil (CIALIS) 10 MG tablet Take 1 tablet (10 mg total) by mouth daily as needed for erectile dysfunction. 5 tablet 0  . aspirin EC 81 MG tablet Take 1 tablet (81 mg total) by mouth daily. 90 tablet 3  . isosorbide mononitrate (IMDUR) 30 MG 24 hr tablet Take 1 tablet (30 mg total) by mouth daily. 30 tablet 6   No current facility-administered medications for this visit.     Allergies: Review of patient's allergies indicates no known allergies.    PHYSICAL EXAM: VS:  BP 140/90   Pulse 77   Ht 5\' 10"  (1.778 m)   Wt 275 lb 12.8 oz (125.1 kg)   SpO2 97%   BMI 39.57 kg/m  , Body mass index is 39.57 kg/m. Wt Readings from Last 3 Encounters:  09/12/16 275  lb 12.8 oz (125.1 kg)  09/11/16 275 lb 6 oz (124.9 kg)  05/18/16 277 lb 3.2 oz (125.7 kg)    GENERAL:  well developed, well nourished, obese, not in acute distress HEENT: normocephalic, pink conjunctivae, anicteric sclerae, no xanthelasma, normal dentition, oropharynx clear NECK:  no neck vein engorgement, JVP normal, no hepatojugular reflux, carotid upstroke brisk and symmetric, no bruit, no thyromegaly, no lymphadenopathy LUNGS:  good respiratory effort, clear to auscultation bilaterally CV:  PMI not displaced, no thrills, no lifts, S1 and S2 within normal limits, no palpable S3 or S4, no murmurs, no rubs, no gallops ABD:  Soft, nontender, nondistended, normoactive bowel sounds, no abdominal aortic bruit, no hepatomegaly, no  splenomegaly MS: nontender back, no kyphosis, no scoliosis, no joint deformities EXT:  2+ DP/PT pulses,  +1 edema, + varicosities, no cyanosis, no clubbing SKIN: warm, nondiaphoretic, normal turgor, no ulcers NEUROPSYCH: alert, oriented to person, place, and time, sensory/motor grossly intact, normal mood, appropriate affect  Recent Labs: 04/16/2016: Hemoglobin 12.2; Platelets 278.0; Pro B Natriuretic peptide (BNP) 22.0; TSH 2.78 09/11/2016: ALT 19; BUN 26; Creatinine, Ser 1.61; Potassium 4.4; Sodium 140   Lipid Panel No results found for: CHOL, TRIG, HDL, CHOLHDL, VLDL, LDLCALC, LDLDIRECT   Other studies Reviewed:  EKG:  The ekg from  05/07/2016 was personally reviewed by me and it revealed  Sinus rhythm, 64 BPM  Additional studies/ records that were reviewed personally reviewed by me today include:  Echo 05/16/2016: Left ventricle: The cavity size was normal. Wall thickness was   increased in a pattern of mild LVH. Systolic function was normal.   The estimated ejection fraction was in the range of 55% to 60%.   Wall motion was normal; there were no regional wall motion   abnormalities. Doppler parameters are consistent with high   ventricular filling pressure. - Left atrium: The atrium was mildly dilated.  Nuclear stress is 05/16/2016:  There was no ST segment deviation noted during stress.  No T wave inversion was noted during stress.  Defect 1: There is a small defect of mild severity present in the apex location.  Findings consistent with mild apical ischemia.  This is a low risk study given that the defect is mild and small.  The left ventricular ejection fraction is normal (55-65%).  ASSESSMENT AND PLAN:  chest pain  shortness of breath  risk factors for CAD include hypertension, diabetes, family history of premature CAD,  History of prior tobacco use. Patient used to see cardiology but no clear follow-up and no available medical records. Results of echocardiogram  and stress testing were discussed with patient. LVEF within normal limits. Possible mild apical ischemia noted on stress test. Overall a low risk study. No significant recurrence of symptoms, symptoms remain atypical. We discussed trial of medical therapy with aspirin 81 mg by mouth daily, Imdur ER 30 mg by mouth daily with follow-up in the office. We have discussed importance of not taking erectile dysfunction medication when he is taking the Imdur.   Hypertension continue monitoring BP. Continue current medical therapy and lifestyle changes.  Obesity Body mass index is 39.57 kg/m.Marland Kitchen Recommend aggressive weight loss through diet and increased physical activity.    because of history of diabetes, LDL goal is less than 70.   Current medicines are reviewed at length with the patient today.  The patient does not have concerns regarding medicines.  Labs/ tests ordered today include:  No orders of the defined types were placed in this encounter.  I had a lengthy and detailed discussion with the patient regarding diagnoses, prognosis, diagnostic options, treatment options.   I counseled the patient on importance of lifestyle modification including heart healthy diet, regular physical activity .  Disposition:   FU with undersigned one month   Signed, Wende Bushy, MD  09/13/2016 9:30 AM    [place]

## 2016-09-12 NOTE — Patient Instructions (Signed)
Medication Instructions:  Your physician has recommended you make the following change in your medication:  1. START Imdur 30 mg (Isosorbide mononitrate) once daily 2. START Aspirin 81 mg once daily  Follow-Up: Your physician recommends that you schedule a follow-up appointment in: 1 month with Dr. Yvone Neu.   It was a pleasure seeing you today here in the office. Please do not hesitate to give [place] a call back if you have any further questions. [place], BSN

## 2016-09-12 NOTE — Telephone Encounter (Signed)
Noted. Patient informed. See back result note.

## 2016-09-13 ENCOUNTER — Encounter: Payer: Self-pay | Admitting: Cardiology

## 2016-09-27 ENCOUNTER — Encounter: Payer: Self-pay | Admitting: *Deleted

## 2016-09-27 ENCOUNTER — Ambulatory Visit: Payer: Self-pay | Admitting: General Surgery

## 2016-10-11 ENCOUNTER — Encounter: Payer: Self-pay | Admitting: Cardiology

## 2016-10-11 ENCOUNTER — Ambulatory Visit (INDEPENDENT_AMBULATORY_CARE_PROVIDER_SITE_OTHER): Payer: BLUE CROSS/BLUE SHIELD | Admitting: Cardiology

## 2016-10-11 VITALS — BP 160/98 | HR 64 | Ht 70.0 in | Wt 275.0 lb

## 2016-10-11 DIAGNOSIS — R0602 Shortness of breath: Secondary | ICD-10-CM

## 2016-10-11 DIAGNOSIS — Z6839 Body mass index (BMI) 39.0-39.9, adult: Secondary | ICD-10-CM

## 2016-10-11 DIAGNOSIS — E6609 Other obesity due to excess calories: Secondary | ICD-10-CM

## 2016-10-11 DIAGNOSIS — I10 Essential (primary) hypertension: Secondary | ICD-10-CM

## 2016-10-11 DIAGNOSIS — R9439 Abnormal result of other cardiovascular function study: Secondary | ICD-10-CM | POA: Diagnosis not present

## 2016-10-11 DIAGNOSIS — IMO0001 Reserved for inherently not codable concepts without codable children: Secondary | ICD-10-CM

## 2016-10-11 NOTE — Patient Instructions (Addendum)
Medication Instructions:  Please try the Imdur as previously ordered.  Your physician has requested that you regularly monitor and record your blood pressure readings at home. Please use the same machine at the same time of day to check your readings and record them to bring to your follow-up visit.  Also try to eat a heart healthy diet. Low salt/low cholesterol.    Your physician recommends that you schedule a follow-up appointment in: 2 months with Dr. Yvone Neu.   It was a pleasure seeing you today here in the office. Please do not hesitate to give [place] a call back if you have any further questions. [place], BSN      Fat and Cholesterol Restricted Diet Introduction Getting too much fat and cholesterol in your diet may cause health problems. Following this diet helps keep your fat and cholesterol at normal levels. This can keep you from getting sick. What types of fat should I choose?  Choose monosaturated and polyunsaturated fats. These are found in foods such as olive oil, canola oil, flaxseeds, walnuts, almonds, and seeds.  Eat more omega-3 fats. Good choices include salmon, mackerel, sardines, tuna, flaxseed oil, and ground flaxseeds.  Limit saturated fats. These are in animal products such as meats, butter, and cream. They can also be in plant products such as palm oil, palm kernel oil, and coconut oil.  Avoid foods with partially hydrogenated oils in them. These contain trans fats. Examples of foods that have trans fats are stick margarine, some tub margarines, cookies, crackers, and other baked goods. What general guidelines do I need to follow?  Check food labels. Look for the words "trans fat" and "saturated fat."  When preparing a meal:  Fill half of your plate with vegetables and green salads.  Fill one fourth of your plate with whole grains. Look for the word "whole" as the first word in the ingredient list.  Fill one fourth of your plate with lean  protein foods.  Eat more foods that have fiber, like apples, carrots, beans, peas, and barley.  Eat more home-cooked foods. Eat less at restaurants and buffets.  Limit or avoid alcohol.  Limit foods high in starch and sugar.  Limit fried foods.  Cook foods without frying them. Baking, boiling, grilling, and broiling are all great options.  Lose weight if you are overweight. Losing even a small amount of weight can help your overall health. It can also help prevent diseases such as diabetes and heart disease. What foods can I eat? Grains  Whole grains, such as whole wheat or whole grain breads, crackers, cereals, and pasta. Unsweetened oatmeal, bulgur, barley, quinoa, or [place] rice. Corn or whole wheat flour tortillas. Vegetables  Fresh or frozen vegetables (raw, steamed, roasted, or grilled). Green salads. Fruits  All fresh, canned (in natural juice), or frozen fruits. Meat and Other Protein Products  Ground beef (85% or leaner), grass-fed beef, or beef trimmed of fat. Skinless chicken or [place]. Ground chicken or [place]. Pork trimmed of fat. All fish and seafood. Eggs. Dried beans, peas, or lentils. Unsalted nuts or seeds. Unsalted canned or dry beans. Dairy  Low-fat dairy products, such as skim or 1% milk, 2% or reduced-fat cheeses, low-fat ricotta or cottage cheese, or plain low-fat yogurt. Fats and Oils  Tub margarines without trans fats. Light or reduced-fat mayonnaise and salad dressings. Avocado. Olive, canola, sesame, or safflower oils. Natural peanut or almond butter (choose ones without added sugar and oil). The items listed above  may not be a complete list of recommended foods or beverages. Contact your dietitian for more options.  What foods are not recommended? Grains  White bread. White pasta. White rice. Cornbread. Bagels, pastries, and croissants. Crackers that contain trans fat. Vegetables  White potatoes. Corn. Creamed or fried vegetables. Vegetables in a cheese  sauce. Fruits  Dried fruits. Canned fruit in light or heavy syrup. Fruit juice. Meat and Other Protein Products  Fatty cuts of meat. Ribs, chicken wings, bacon, sausage, bologna, salami, chitterlings, fatback, hot dogs, bratwurst, and packaged luncheon meats. Liver and organ meats. Dairy  Whole or 2% milk, cream, half-and-half, and cream cheese. Whole milk cheeses. Whole-fat or sweetened yogurt. Full-fat cheeses. Nondairy creamers and whipped toppings. Processed cheese, cheese spreads, or cheese curds. Sweets and Desserts  Corn syrup, sugars, honey, and molasses. Candy. Jam and jelly. Syrup. Sweetened cereals. Cookies, pies, cakes, donuts, muffins, and ice cream. Fats and Oils  Butter, stick margarine, lard, shortening, ghee, or bacon fat. Coconut, palm kernel, or palm oils. Beverages  Alcohol. Sweetened drinks (such as sodas, lemonade, and fruit drinks or punches). The items listed above may not be a complete list of foods and beverages to avoid. Contact your dietitian for more information.  This information is not intended to replace advice given to you by your health care provider. Make sure you discuss any questions you have with your health care provider. Document Released: 05/13/2012 Document Revised: 07/19/2016 Document Reviewed: 02/11/2014  2017 Elsevier  Heart-Healthy Eating Plan Introduction Heart-healthy meal planning includes:  Limiting unhealthy fats.  Increasing healthy fats.  Making other small dietary changes. You may need to talk with your doctor or a diet specialist (dietitian) to create an eating plan that is right for you. What types of fat should I choose?  Choose healthy fats. These include olive oil and canola oil, flaxseeds, walnuts, almonds, and seeds.  Eat more omega-3 fats. These include salmon, mackerel, sardines, tuna, flaxseed oil, and ground flaxseeds. Try to eat fish at least twice each week.  Limit saturated fats.  Saturated fats are often found  in animal products, such as meats, butter, and cream.  Plant sources of saturated fats include palm oil, palm kernel oil, and coconut oil.  Avoid foods with partially hydrogenated oils in them. These include stick margarine, some tub margarines, cookies, crackers, and other baked goods. These contain trans fats. What general guidelines do I need to follow?  Check food labels carefully. Identify foods with trans fats or high amounts of saturated fat.  Fill one half of your plate with vegetables and green salads. Eat 4-5 servings of vegetables per day. A serving of vegetables is:  1 cup of raw leafy vegetables.   cup of raw or cooked cut-up vegetables.   cup of vegetable juice.  Fill one fourth of your plate with whole grains. Look for the word "whole" as the first word in the ingredient list.  Fill one fourth of your plate with lean protein foods.  Eat 4-5 servings of fruit per day. A serving of fruit is:  One medium whole fruit.   cup of dried fruit.   cup of fresh, frozen, or canned fruit.   cup of 100% fruit juice.  Eat more foods that contain soluble fiber. These include apples, broccoli, carrots, beans, peas, and barley. Try to get 20-30 g of fiber per day.  Eat more home-cooked food. Eat less restaurant, buffet, and fast food.  Limit or avoid alcohol.  Limit foods high in  starch and sugar.  Avoid fried foods.  Avoid frying your food. Try baking, boiling, grilling, or broiling it instead. You can also reduce fat by:  Removing the skin from poultry.  Removing all visible fats from meats.  Skimming the fat off of stews, soups, and gravies before serving them.  Steaming vegetables in water or broth.  Lose weight if you are overweight.  Eat 4-5 servings of nuts, legumes, and seeds per week:  One serving of dried beans or legumes equals  cup after being cooked.  One serving of nuts equals 1 ounces.  One serving of seeds equals  ounce or one  tablespoon.  You may need to keep track of how much salt or sodium you eat. This is especially true if you have high blood pressure. Talk with your doctor or dietitian to get more information. What foods can I eat? Grains  Breads, including [place], white, pita, wheat, raisin, rye, oatmeal, and [place]. Tortillas that are neither fried nor made with lard or trans fat. Low-fat rolls, including hotdog and hamburger buns and English muffins. Biscuits. Muffins. Waffles. Pancakes. Light popcorn. Whole-grain cereals. Flatbread. Melba toast. Pretzels. Breadsticks. Rusks. Low-fat snacks. Low-fat crackers, including oyster, saltine, matzo, graham, animal, and rye. Rice and pasta, including [place] rice and pastas that are made with whole wheat. Vegetables  All vegetables. Fruits  All fruits, but limit coconut. Meats and Other Protein Sources  Lean, well-trimmed beef, veal, pork, and lamb. Chicken and [place] without skin. All fish and shellfish. Wild duck, rabbit, pheasant, and venison. Egg whites or low-cholesterol egg substitutes. Dried beans, peas, lentils, and tofu. Seeds and most nuts. Dairy  Low-fat or nonfat cheeses, including ricotta, string, and mozzarella. Skim or 1% milk that is liquid, powdered, or evaporated. Buttermilk that is made with low-fat milk. Nonfat or low-fat yogurt. Beverages  Mineral water. Diet carbonated beverages. Sweets and Desserts  [place] and fruit ices. Honey, jam, marmalade, jelly, and syrups. Meringues and gelatins. Pure sugar candy, such as hard candy, jelly beans, gumdrops, mints, marshmallows, and small amounts of dark chocolate. [employer] Eat all sweets and desserts in moderation. Fats and Oils  Nonhydrogenated (trans-free) margarines. Vegetable oils, including soybean, sesame, sunflower, olive, peanut, safflower, corn, canola, and cottonseed. Salad dressings or mayonnaise made with a vegetable oil. Limit added fats and oils that you use for cooking, baking,  salads, and as spreads. Other  Cocoa powder. Coffee and tea. All seasonings and condiments. The items listed above may not be a complete list of recommended foods or beverages. Contact your dietitian for more options.  What foods are not recommended? Grains  Breads that are made with saturated or trans fats, oils, or whole milk. Croissants. Butter rolls. Cheese breads. Sweet rolls. Donuts. Buttered popcorn. Chow mein noodles. High-fat crackers, such as cheese or butter crackers. Meats and Other Protein Sources  Fatty meats, such as hotdogs, short ribs, sausage, spareribs, bacon, rib eye roast or steak, and mutton. High-fat deli meats, such as salami and bologna. Caviar. Domestic duck and goose. Organ meats, such as kidney, liver, sweetbreads, and heart. Dairy  Cream, sour cream, cream cheese, and creamed cottage cheese. Whole-milk cheeses, including blue (bleu), Monte[place] Jack, [employer], [place], American, [place], Swiss, cheddar, [place], and [place]. Whole or 2% milk that is liquid, evaporated, or condensed. Whole buttermilk. Cream sauce or high-fat cheese sauce. Yogurt that is made from whole milk. Beverages  Regular sodas and juice drinks with added sugar. Sweets and Desserts  Frosting. Pudding. Cookies. Cakes other  than angel food cake. Candy that has milk chocolate or white chocolate, hydrogenated fat, butter, coconut, or unknown ingredients. Buttered syrups. Full-fat ice cream or ice cream drinks. Fats and Oils  Gravy that has suet, meat fat, or shortening. Cocoa butter, hydrogenated oils, palm oil, coconut oil, palm kernel oil. These can often be found in baked products, candy, fried foods, nondairy creamers, and whipped toppings. Solid fats and shortenings, including bacon fat, salt pork, lard, and butter. Nondairy cream substitutes, such as coffee creamers and sour cream substitutes. Salad dressings that are made of unknown oils, cheese, or sour cream. The items listed above may not be a  complete list of foods and beverages to avoid. Contact your dietitian for more information.  This information is not intended to replace advice given to you by your health care provider. Make sure you discuss any questions you have with your health care provider. Document Released: 05/13/2012 Document Revised: 04/19/2016 Document Reviewed: 05/06/2014  2017 Elsevier

## 2016-10-11 NOTE — Progress Notes (Signed)
Cardiology Office Note   Date:  10/11/2016   ID:  Roger [name], DOB May 10, 1956, MRN [id number]  Referring Doctor:  [name], MD   Cardiologist:   Wende Bushy, MD   Reason for consultation:  Chief Complaint  Patient presents with  . other    4w fu. Pt c/o some SOB. Reviewed meds with pt verbally.      History of Present Illness: Roger [name] is a 60 y.o. male who presents for  Follow-up afterInitiation of medication  Since last visit, patient has had no episode of chest pain. Apparently, patient tried isosorbide mononitrate but stopped after a few days due to feeling lightheaded or headache. He did not inform our office.   Previously:  Review of care everywhere shows stress test October 2014 showing Evidence for mild distal anterior, anteroseptal and periapical ischemia s/p regadenoson stress.  LVEF = 52%. Mild apical hypokinesis with otherwise normal systolic function.  he had no clear follow-up with cardiology after that time.  He has chronic shortness of breath. He admits to having had for breakfast today. He has not been monitoring his blood pressure. No loss of consciousness, palpitations, PND, orthopnea, edema.  ROS:  Please see the history of present illness. Aside from mentioned under HPI, all other systems are reviewed and negative.     Past Medical History:  Diagnosis Date  . Diabetes mellitus without complication ([place])   . Hematochezia   . Hypertension   . MI (myocardial infarction)     Past Surgical History:  Procedure Laterality Date  . thumb surgery       reports that he has never smoked. He has never used smokeless tobacco. He reports that he drinks about 3.6 oz of alcohol per week . He reports that he does not use drugs.   family history includes Heart Problems in his mother.   Current Outpatient Prescriptions  Medication Sig Dispense Refill  . allopurinol (ZYLOPRIM) 300 MG tablet Take 1 tablet (300 mg total) by mouth daily. 90  tablet 3  . aspirin EC 81 MG tablet Take 1 tablet (81 mg total) by mouth daily. 90 tablet 3  . colchicine 0.6 MG tablet Take 1 tablet (0.6 mg total) by mouth daily. 30 tablet 0  . ibuprofen (ADVIL,MOTRIN) 800 MG tablet Take 1 tablet (800 mg total) by mouth every 8 (eight) hours as needed. 30 tablet 0  . isosorbide mononitrate (IMDUR) 30 MG 24 hr tablet Take 1 tablet (30 mg total) by mouth daily. 30 tablet 6  . lisinopril (PRINIVIL,ZESTRIL) 20 MG tablet Take 1 tablet by mouth as needed. Reported on 05/07/2016    . metFORMIN (GLUCOPHAGE) 500 MG tablet Take 500 mg by mouth 2 (two) times daily with a meal.    . metoprolol tartrate (LOPRESSOR) 25 MG tablet Take 1 tablet (25 mg total) by mouth 2 (two) times daily. 180 tablet 1  . pantoprazole (PROTONIX) 40 MG tablet Take 1 tablet (40 mg total) by mouth daily. 30 tablet 1  . tadalafil (CIALIS) 10 MG tablet Take 1 tablet (10 mg total) by mouth daily as needed for erectile dysfunction. 5 tablet 0   No current facility-administered medications for this visit.     Allergies: Patient has no known allergies.    PHYSICAL EXAM: VS:  BP (!) 160/98 (BP Location: Left Arm, Patient Position: Sitting, Cuff Size: Large)   Pulse 64   Ht 5\' 10"  (1.778 m)   Wt 275 lb (124.7 kg)  BMI 39.46 kg/m  , Body mass index is 39.46 kg/m. Wt Readings from Last 3 Encounters:  10/11/16 275 lb (124.7 kg)  09/12/16 275 lb 12.8 oz (125.1 kg)  09/11/16 275 lb 6 oz (124.9 kg)    GENERAL:  well developed, well nourished, obese, not in acute distress HEENT: normocephalic, pink conjunctivae, anicteric sclerae, no xanthelasma, normal dentition, oropharynx clear NECK:  no neck vein engorgement, JVP normal, no hepatojugular reflux, carotid upstroke brisk and symmetric, no bruit, no thyromegaly, no lymphadenopathy LUNGS:  good respiratory effort, clear to auscultation bilaterally CV:  PMI not displaced, no thrills, no lifts, S1 and S2 within normal limits, no palpable S3 or S4, no  murmurs, no rubs, no gallops ABD:  Soft, nontender, nondistended, normoactive bowel sounds, no abdominal aortic bruit, no hepatomegaly, no splenomegaly MS: nontender back, no kyphosis, no scoliosis, no joint deformities EXT:  2+ DP/PT pulses,  +1 edema, + varicosities, no cyanosis, no clubbing SKIN: warm, nondiaphoretic, normal turgor, no ulcers NEUROPSYCH: alert, oriented to person, place, and time, sensory/motor grossly intact, normal mood, appropriate affect  Recent Labs: 04/16/2016: Hemoglobin 12.2; Platelets 278.0; Pro B Natriuretic peptide (BNP) 22.0; TSH 2.78 09/11/2016: ALT 19; BUN 26; Creatinine, Ser 1.61; Potassium 4.4; Sodium 140   Lipid Panel No results found for: CHOL, TRIG, HDL, CHOLHDL, VLDL, LDLCALC, LDLDIRECT   Other studies Reviewed:  EKG:  The ekg from  05/07/2016 was personally reviewed by me and it revealed  Sinus rhythm, 64 BPM  Additional studies/ records that were reviewed personally reviewed by me today include:  Echo 05/16/2016: Left ventricle: The cavity size was normal. Wall thickness was   increased in a pattern of mild LVH. Systolic function was normal.   The estimated ejection fraction was in the range of 55% to 60%.   Wall motion was normal; there were no regional wall motion   abnormalities. Doppler parameters are consistent with high   ventricular filling pressure. - Left atrium: The atrium was mildly dilated.  Nuclear stress is 05/16/2016:  There was no ST segment deviation noted during stress.  No T wave inversion was noted during stress.  Defect 1: There is a small defect of mild severity present in the apex location.  Findings consistent with mild apical ischemia.  This is a low risk study given that the defect is mild and small.  The left ventricular ejection fraction is normal (55-65%).  ASSESSMENT AND PLAN:  chest pain - no recurrence of far  shortness of breath  risk factors for CAD include hypertension, diabetes, family history of  premature CAD,  History of prior tobacco use. Patient used to see cardiology but no clear follow-up and no available medical records. Results of echocardiogram and stress testing were discussed with patient. LVEF within normal limits. Possible mild apical ischemia noted on stress test. Overall a low risk study. We have re-discussed trial of medical therapy with aspirin 81 mg by mouth daily, Imdur ER 30 mg by mouth daily with follow-up in the office in 2 mo. We have discussed importance of not taking erectile dysfunction medication when he is taking the Imdur. Pt is willing to try Imdur again and will take Tylenol for headache.   Hypertension continue monitoring BP. Needs BP log. Continue current medical therapy and lifestyle changes. We had a lengthy and detailed discussion about importance of sodium restriction, heart healthy diet, importance of weight loss.  Obesity Body mass index is 39.46 kg/m.Marland Kitchen Recommend aggressive weight loss through diet and increased physical activity.  because of history of diabetes, LDL goal is less than 70.   Current medicines are reviewed at length with the patient today.  The patient does not have concerns regarding medicines.  Labs/ tests ordered today include:  No orders of the defined types were placed in this encounter.   I had a lengthy and detailed discussion with the patient regarding diagnoses, prognosis, diagnostic options, treatment options.   I counseled the patient on importance of lifestyle modification including heart healthy diet, regular physical activity .  Disposition:   FU with undersigned in 2 month s  Signed, Wende Bushy, MD  10/11/2016 10:21 AM    [place]

## 2016-10-12 ENCOUNTER — Encounter: Payer: Self-pay | Admitting: Family Medicine

## 2016-10-12 ENCOUNTER — Ambulatory Visit (INDEPENDENT_AMBULATORY_CARE_PROVIDER_SITE_OTHER): Payer: BLUE CROSS/BLUE SHIELD | Admitting: Family Medicine

## 2016-10-12 VITALS — BP 124/82 | HR 73 | Temp 98.1°F | Wt 276.4 lb

## 2016-10-12 DIAGNOSIS — E6609 Other obesity due to excess calories: Secondary | ICD-10-CM

## 2016-10-12 DIAGNOSIS — G629 Polyneuropathy, unspecified: Secondary | ICD-10-CM

## 2016-10-12 DIAGNOSIS — IMO0001 Reserved for inherently not codable concepts without codable children: Secondary | ICD-10-CM

## 2016-10-12 DIAGNOSIS — R079 Chest pain, unspecified: Secondary | ICD-10-CM

## 2016-10-12 DIAGNOSIS — E1142 Type 2 diabetes mellitus with diabetic polyneuropathy: Secondary | ICD-10-CM

## 2016-10-12 DIAGNOSIS — Z6839 Body mass index (BMI) 39.0-39.9, adult: Secondary | ICD-10-CM

## 2016-10-12 DIAGNOSIS — E669 Obesity, unspecified: Secondary | ICD-10-CM | POA: Insufficient documentation

## 2016-10-12 LAB — COMPREHENSIVE METABOLIC PANEL WITH GFR
ALT: 25 U/L (ref 9–46)
AST: 20 U/L (ref 10–35)
Albumin: 3.9 g/dL (ref 3.6–5.1)
Alkaline Phosphatase: 84 U/L (ref 40–115)
BUN: 25 mg/dL (ref 7–25)
CO2: 26 mmol/L (ref 20–31)
Calcium: 9.4 mg/dL (ref 8.6–10.3)
Chloride: 104 mmol/L (ref 98–110)
Creat: 1.3 mg/dL (ref 0.70–1.33)
Glucose, Bld: 146 mg/dL — ABNORMAL HIGH (ref 65–99)
Potassium: 3.9 mmol/L (ref 3.5–5.3)
Sodium: 139 mmol/L (ref 135–146)
Total Bilirubin: 0.4 mg/dL (ref 0.2–1.2)
Total Protein: 6.7 g/dL (ref 6.1–8.1)

## 2016-10-12 LAB — HEMOGLOBIN A1C
Hgb A1c MFr Bld: 6.8 % — ABNORMAL HIGH
Mean Plasma Glucose: 148 mg/dL

## 2016-10-12 NOTE — Assessment & Plan Note (Signed)
Patient with tingling in the bottom of his bilateral feet left greater than right. Exam today does reveal decreased monofilament sensation. I discussed medication management with the patient and the importance of monitoring his feet for any skin changes. Also discussed the importance of good diabetes management. Patient declined medication for this issue and opted to continue to monitor. We will continue his metformin and check an A1c for his diabetes. He is given return precautions.

## 2016-10-12 NOTE — Progress Notes (Signed)
Pre visit review using our clinic review tool, if applicable. No additional management support is needed unless otherwise documented below in the visit note. 

## 2016-10-12 NOTE — Progress Notes (Signed)
[name], MD Phone: [phone]  Roger [name] is a 60 y.o. male who presents today for follow-up.  DIABETES Disease Monitoring: Blood Sugar ranges-not checking Polyuria/phagia/dipsia- no      Visual problems- some issues with near vision, has not seen ophthalmology in some time Medications: Compliance- taking metformin Hypoglycemic symptoms- no  Patient continues to have tingling sensation in the bottom of his left foot. He also notes that occasionally this occurs in the bottom of his right foot. The cramping he had in his left lower extremity has resolved. He had negative workup for DVT and had a negative d-dimer on his last visit a month ago. He notes no back discomfort. He notes no numbness or weakness. He notes it is just there. Does not bother him that much.  He recently saw cardiology in follow-up. He had a recent stress test which was abnormal. They discussed medical management of this with aspirin and Imdur. He has not started the Imdur yet. He was advised at that visit and today to avoid erectile dysfunction medications given that he will be on Imdur. He notes no chest pain. Some intermittent shortness of breath that is stable. None at this time. No edema in his lower extremities.  Patient feels like he is been adding weight. Feels as though his abdominal circumference has increased. He wonders if he may be retaining some fluid with this or if it is just weight gain. He has not had any edema. No orthopnea. He does not eat healthfully. He is going to start eating more vegetables and leaner meats   PMH: nonsmoker.   ROS see history of present illness  Objective  Physical Exam Vitals:   10/12/16 1554  BP: 124/82  Pulse: 73  Temp: 98.1 F (36.7 C)    BP Readings from Last 3 Encounters:  10/12/16 124/82  10/11/16 (!) 160/98  09/12/16 140/90   Wt Readings from Last 3 Encounters:  10/12/16 276 lb 6.4 oz (125.4 kg)  10/11/16 275 lb (124.7 kg)  09/12/16 275 lb  12.8 oz (125.1 kg)    Physical Exam  Constitutional: No distress.  Cardiovascular: Normal rate, regular rhythm and normal heart sounds.   Pulmonary/Chest: Effort normal and breath sounds normal.  Abdominal: Soft. Bowel sounds are normal. He exhibits no distension. There is no tenderness. There is no rebound and no guarding.  Musculoskeletal: He exhibits no edema.  Neurological: He is alert. Gait normal.  Skin: Skin is warm and dry. He is not diaphoretic.   Diabetic Foot Exam - Simple   Simple Foot Form Diabetic Foot exam was performed with the following findings:  Yes 10/12/2016  4:29 PM  Visual Inspection See comments:  Yes Sensation Testing See comments:  Yes Pulse Check Posterior Tibialis and Dorsalis pulse intact bilaterally:  Yes Comments Patient with thickening of several toenails, no other deformities, ulcerations, or skin breakdown bilaterally in his feet, decreased monofilament testing of her toes and plantar surfaces of feet, intact to light touch bilaterally     Assessment/Plan: Please see individual problem list.  Diabetes ([place]) Continues to take metformin. We will check an A1c today. Foot exam completed today with evidence of neuropathy. Suspect this is the cause of his tingling in the bottom of his feet. Please see neuropathy problem for documentation. We will refer to ophthalmology for evaluation as well.  Neuropathy ([place]) Patient with tingling in the bottom of his bilateral feet left greater than right. Exam today does reveal decreased monofilament sensation. I discussed medication  management with the patient and the importance of monitoring his feet for any skin changes. Also discussed the importance of good diabetes management. Patient declined medication for this issue and opted to continue to monitor. We will continue his metformin and check an A1c for his diabetes. He is given return precautions.  Chest pain No recurrence. Does have some baseline shortness of  breath. He is followed by cardiology and they have placed him on Imdur. He was encouraged to start this. Advised not to use erectile dysfunction medications with this. Discussed return precautions.  Obesity Discussed that the patient's abdominal girth is likely related to obesity. He has no signs of edema on exam in his lower extremities and there is no evidence of pitting in his abdomen. Given his concern for fluid retention we'll check a CMP to recheck his kidney function as it was slightly up previously. I discussed diet with him extensively. I advised him not to do anything excessive physically more than he already is given his potential cardiac issues.   Orders Placed This Encounter  Procedures  . HgB A1c  . Comp Met (CMET)  . Ambulatory referral to Ophthalmology    Referral Priority:   Routine    Referral Type:   Consultation    Referral Reason:   Specialty Services Required    Requested Specialty:   Ophthalmology    Number of Visits Requested:   [place], MD [place]

## 2016-10-12 NOTE — Assessment & Plan Note (Signed)
Discussed that the patient's abdominal girth is likely related to obesity. He has no signs of edema on exam in his lower extremities and there is no evidence of pitting in his abdomen. Given his concern for fluid retention we'll check a CMP to recheck his kidney function as it was slightly up previously. I discussed diet with him extensively. I advised him not to do anything excessive physically more than he already is given his potential cardiac issues.

## 2016-10-12 NOTE — Patient Instructions (Signed)
Nice to see you. We will check some lab work today. Please monitor your feet on a daily basis and if you develop any cuts or signs of infection please seek medical attention. Please follow Dr. Orion Modest and patient's room regarding your heart.

## 2016-10-12 NOTE — Assessment & Plan Note (Signed)
No recurrence. Does have some baseline shortness of breath. He is followed by cardiology and they have placed him on Imdur. He was encouraged to start this. Advised not to use erectile dysfunction medications with this. Discussed return precautions.

## 2016-10-12 NOTE — Assessment & Plan Note (Addendum)
Continues to take metformin. We will check an A1c today. Foot exam completed today with evidence of neuropathy. Suspect this is the cause of his tingling in the bottom of his feet. Please see neuropathy problem for documentation. We will refer to ophthalmology for evaluation as well.

## 2016-10-23 ENCOUNTER — Encounter: Payer: Self-pay | Admitting: Family Medicine

## 2016-11-13 ENCOUNTER — Other Ambulatory Visit: Payer: Self-pay | Admitting: Family Medicine

## 2016-11-13 NOTE — Telephone Encounter (Signed)
Last filled 09/11/16 30 0rf

## 2016-11-14 NOTE — Telephone Encounter (Signed)
Patient states he has been taking the colcrys everyday but he has now ran out. Patient informed he should not be taking the ibuprofen.

## 2016-11-14 NOTE — Telephone Encounter (Signed)
Colchicine sent to pharmacy.

## 2016-11-14 NOTE — Telephone Encounter (Signed)
Please find out if the patient is taking the colchicine daily. Given his heart issues he should really not be taking ibuprofen. Please see if he's been taking this as well. Thanks.

## 2016-11-14 NOTE — Telephone Encounter (Signed)
Left message to return call 

## 2016-11-28 LAB — HM DIABETES EYE EXAM

## 2016-11-29 ENCOUNTER — Encounter: Payer: Self-pay | Admitting: Family Medicine

## 2016-12-11 ENCOUNTER — Encounter: Payer: Self-pay | Admitting: Cardiology

## 2016-12-11 ENCOUNTER — Ambulatory Visit (INDEPENDENT_AMBULATORY_CARE_PROVIDER_SITE_OTHER): Payer: BLUE CROSS/BLUE SHIELD | Admitting: Cardiology

## 2016-12-11 VITALS — BP 150/100 | HR 64 | Ht 70.0 in | Wt 279.2 lb

## 2016-12-11 DIAGNOSIS — E6609 Other obesity due to excess calories: Secondary | ICD-10-CM | POA: Diagnosis not present

## 2016-12-11 DIAGNOSIS — I10 Essential (primary) hypertension: Secondary | ICD-10-CM

## 2016-12-11 DIAGNOSIS — IMO0001 Reserved for inherently not codable concepts without codable children: Secondary | ICD-10-CM

## 2016-12-11 DIAGNOSIS — Z6839 Body mass index (BMI) 39.0-39.9, adult: Secondary | ICD-10-CM | POA: Diagnosis not present

## 2016-12-11 NOTE — Patient Instructions (Addendum)
Medication Instructions:  Your physician recommends that you continue on your current medications as directed. Please refer to the Current Medication list given to you today.   Labwork: none  Testing/Procedures: none  Follow-Up: Your physician wants you to follow-up in: 4 months with Dr. Yvone Neu.  You will receive a reminder letter in the mail two months in advance. If you don't receive a letter, please call our office to schedule the follow-up appointment.   Any Other Special Instructions Will Be Listed Below (If Applicable). Please follow up with your PCP regarding a sleep study. Monitor your BP at least once daily, keep a log and notify us if pressures continue to be elevated. Continue working on a healthy lifestyle. Goal is to lose one pound per week.     If you need a refill on your cardiac medications before your next appointment, please call your pharmacy.

## 2016-12-11 NOTE — Progress Notes (Signed)
Cardiology Office Note   Date:  12/11/2016   ID:  Roger [name], DOB 07/13/1956, MRN [id number]  Referring Doctor:  [name], MD   Cardiologist:   Wende Bushy, MD   Reason for consultation:  Chief Complaint  Patient presents with  . other    2 month f/u no complaints today. Meds reviewed verbally with pt.      History of Present Illness: Roger [name] is a 61 y.o. male who presents for  Follow-up after initiation of medication.  Since last visit, patient reports that he has been taking Imdur every day. He sometimes forgets to take lisinopril. He reports that he takes metoprolol every day as well. He reports that shortness of breath has not progressed. No recurrence of chest pain. No PND, orthopnea, edema, lightheadedness, loss of consciousness, palpitations. He does not monitor his blood pressure regularly.  ROS:  Please see the history of present illness. Aside from mentioned under HPI, all other systems are reviewed and negative.    Past Medical History:  Diagnosis Date  . Diabetes mellitus without complication ([place])   . Hematochezia   . Hypertension   . MI (myocardial infarction)     Past Surgical History:  Procedure Laterality Date  . thumb surgery       reports that he has never smoked. He has never used smokeless tobacco. He reports that he drinks about 3.6 oz of alcohol per week . He reports that he does not use drugs.   family history includes Heart Problems in his mother.   Current Outpatient Prescriptions  Medication Sig Dispense Refill  . allopurinol (ZYLOPRIM) 300 MG tablet Take 1 tablet (300 mg total) by mouth daily. 90 tablet 3  . aspirin EC 81 MG tablet Take 1 tablet (81 mg total) by mouth daily. 90 tablet 3  . COLCRYS 0.6 MG tablet TAKE 1 TABLET(0.6 MG) BY MOUTH DAILY 30 tablet 3  . ibuprofen (ADVIL,MOTRIN) 800 MG tablet Take 1 tablet (800 mg total) by mouth every 8 (eight) hours as needed. 30 tablet 0  . isosorbide mononitrate (IMDUR)  30 MG 24 hr tablet Take 1 tablet (30 mg total) by mouth daily. 30 tablet 6  . lisinopril (PRINIVIL,ZESTRIL) 20 MG tablet Take 1 tablet by mouth as needed. Reported on 05/07/2016    . metFORMIN (GLUCOPHAGE) 500 MG tablet Take 500 mg by mouth 2 (two) times daily with a meal.    . metoprolol tartrate (LOPRESSOR) 25 MG tablet Take 1 tablet (25 mg total) by mouth 2 (two) times daily. 180 tablet 1  . pantoprazole (PROTONIX) 40 MG tablet Take 1 tablet (40 mg total) by mouth daily. 30 tablet 1   No current facility-administered medications for this visit.     Allergies: Patient has no known allergies.    PHYSICAL EXAM: VS:  BP (!) 150/100 (BP Location: Left Arm, Patient Position: Sitting, Cuff Size: Large)   Pulse 64   Ht 5\' 10"  (1.778 m)   Wt 279 lb 4 oz (126.7 kg)   BMI 40.07 kg/m  , Body mass index is 40.07 kg/m. Wt Readings from Last 3 Encounters:  12/11/16 279 lb 4 oz (126.7 kg)  10/12/16 276 lb 6.4 oz (125.4 kg)  10/11/16 275 lb (124.7 kg)    GENERAL:  well developed, well nourished, obese, not in acute distress HEENT: normocephalic, pink conjunctivae, anicteric sclerae, no xanthelasma, normal dentition, oropharynx clear NECK:  no neck vein engorgement, JVP normal, no hepatojugular reflux, carotid upstroke  brisk and symmetric, no bruit, no thyromegaly, no lymphadenopathy LUNGS:  good respiratory effort, clear to auscultation bilaterally CV:  PMI not displaced, no thrills, no lifts, S1 and S2 within normal limits, no palpable S3 or S4, no murmurs, no rubs, no gallops ABD:  Soft, nontender, nondistended, normoactive bowel sounds, no abdominal aortic bruit, no hepatomegaly, no splenomegaly MS: nontender back, no kyphosis, no scoliosis, no joint deformities EXT:  2+ DP/PT pulses, no edema, no varicosities, no cyanosis, no clubbing SKIN: warm, nondiaphoretic, normal turgor, no ulcers NEUROPSYCH: alert, oriented to person, place, and time, sensory/motor grossly intact, normal mood,  appropriate affect   Recent Labs: 04/16/2016: Hemoglobin 12.2; Platelets 278.0; Pro B Natriuretic peptide (BNP) 22.0; TSH 2.78 10/12/2016: ALT 25; BUN 25; Creat 1.30; Potassium 3.9; Sodium 139   Lipid Panel No results found for: CHOL, TRIG, HDL, CHOLHDL, VLDL, LDLCALC, LDLDIRECT   Other studies Reviewed:  EKG:  The ekg from  05/07/2016 was personally reviewed by me and it revealed  Sinus rhythm, 64 BPM  Additional studies/ records that were reviewed personally reviewed by me today include:  Echo 05/16/2016: Left ventricle: The cavity size was normal. Wall thickness was   increased in a pattern of mild LVH. Systolic function was normal.   The estimated ejection fraction was in the range of 55% to 60%.   Wall motion was normal; there were no regional wall motion   abnormalities. Doppler parameters are consistent with high   ventricular filling pressure. - Left atrium: The atrium was mildly dilated.  Nuclear stress is 05/16/2016:  There was no ST segment deviation noted during stress.  No T wave inversion was noted during stress.  Defect 1: There is a small defect of mild severity present in the apex location.  Findings consistent with mild apical ischemia.  This is a low risk study given that the defect is mild and small.  The left ventricular ejection fraction is normal (55-65%).  ASSESSMENT AND PLAN:  chest pain - no recurrence of far  shortness of breath Per 10/11/2016 office note, risk factors for CAD include hypertension, diabetes, family history of premature CAD,  History of prior tobacco use. LVEF within normal limits. Possible mild apical ischemia noted on stress test. Overall a low risk study. Recommend to continue aspirin 81 daily. Imdur ER 30 mg daily.  Hypertension Recommend strict compliance with medical therapy. Recommend pressure monitoring and blood pressure log. Patient to see PCP soon. Discussed importance of checking in with PCP regarding possible sleep  study as part of workup for hypertension. Discussed importance of lifestyle modification, sodium restriction, heart healthy lifestyle, increased physical activity to lose weight.  Obesity Body mass index is 40.07 kg/m.Marland Kitchen Recommend aggressive weight loss through diet and increased physical activity.   Per 10/11/2016 office note, because of history of diabetes, LDL goal is less than 70.   Current medicines are reviewed at length with the patient today.  The patient does not have concerns regarding medicines.  Labs/ tests ordered today include:  No orders of the defined types were placed in this encounter.  I had a lengthy and detailed discussion with the patient regarding diagnoses, prognosis, diagnostic options, treatment options, and side effects of medications.   I counseled the patient on importance of lifestyle modification including heart healthy diet, regular physical activity .   Disposition:   FU with undersigned in 3-4 months  Signed, Wende Bushy, MD  12/11/2016 1:08 PM    [place]

## 2017-01-02 ENCOUNTER — Ambulatory Visit (INDEPENDENT_AMBULATORY_CARE_PROVIDER_SITE_OTHER): Payer: BLUE CROSS/BLUE SHIELD

## 2017-01-02 ENCOUNTER — Encounter: Payer: Self-pay | Admitting: Family Medicine

## 2017-01-02 ENCOUNTER — Ambulatory Visit (INDEPENDENT_AMBULATORY_CARE_PROVIDER_SITE_OTHER): Payer: BLUE CROSS/BLUE SHIELD | Admitting: Family Medicine

## 2017-01-02 VITALS — BP 110/70 | HR 69 | Temp 99.7°F | Wt 273.0 lb

## 2017-01-02 DIAGNOSIS — R059 Cough, unspecified: Secondary | ICD-10-CM

## 2017-01-02 DIAGNOSIS — R05 Cough: Secondary | ICD-10-CM | POA: Diagnosis not present

## 2017-01-02 DIAGNOSIS — R509 Fever, unspecified: Secondary | ICD-10-CM

## 2017-01-02 DIAGNOSIS — J111 Influenza due to unidentified influenza virus with other respiratory manifestations: Secondary | ICD-10-CM | POA: Insufficient documentation

## 2017-01-02 LAB — POCT INFLUENZA A/B
INFLUENZA B, POC: POSITIVE — AB
Influenza A, POC: POSITIVE — AB

## 2017-01-02 MED ORDER — PREDNISONE 20 MG PO TABS: 40.0000 mg | ORAL_TABLET | Freq: Every day | ORAL | 0 refills | Status: DC

## 2017-01-02 MED ORDER — OSELTAMIVIR PHOSPHATE 75 MG PO CAPS: 75.0000 mg | ORAL_CAPSULE | Freq: Two times a day (BID) | ORAL | 0 refills | Status: DC

## 2017-01-02 NOTE — Progress Notes (Signed)
Pre visit review using our clinic review tool, if applicable. No additional management support is needed unless otherwise documented below in the visit note. 

## 2017-01-02 NOTE — Progress Notes (Signed)
[name], MD Phone: [phone]  Roger [name] is a 61 y.o. male who presents today for same-day visit.  Patient notes 2-3 days of symptoms. Has had some mild sinus pressure and congestion. Low-lying some cream-colored mucus out of his nose. He notes he feels congested behind his eyes. His eyes were itchy and watery yesterday though his vision has been okay. He notes mild cough productive of thick creamy mucus. Has felt feverish. Some shortness of breath. Feels congested in his chest with tightness and wheezing though no chest pressure or radiation of the tightness. Notes he developed a rash 3 days ago after starting on Mucinex. Notes they've been red bumps that event scattered over his arms and legs. Currently only over the posterior aspect of his thighs. They come and go. They do itch.   PMH: nonsmoker.   ROS see history of present illness  Objective  Physical Exam Vitals:   01/02/17 0818  BP: 110/70  Pulse: 69  Temp: 99.7 F (37.6 C)    BP Readings from Last 3 Encounters:  01/02/17 110/70  12/11/16 (!) 150/100  10/12/16 124/82   Wt Readings from Last 3 Encounters:  01/02/17 273 lb (123.8 kg)  12/11/16 279 lb 4 oz (126.7 kg)  10/12/16 276 lb 6.4 oz (125.4 kg)    Physical Exam  Constitutional: No distress.  HENT:  Head: Normocephalic and atraumatic.  Mouth/Throat: Oropharynx is clear and moist. No oropharyngeal exudate.  Eyes: Conjunctivae are normal. Pupils are equal, round, and reactive to light.  Neck: Neck supple.  Cardiovascular: Normal rate, regular rhythm and normal heart sounds.   Pulmonary/Chest: Effort normal. No respiratory distress. He has wheezes (mild scattered wheezes, scattered coarse breath sounds). He has no rales.  Abdominal: Soft. He exhibits no distension. There is no tenderness.  Musculoskeletal: He exhibits no edema.  Lymphadenopathy:    He has no cervical adenopathy.  Neurological: He is alert. Gait normal.  Skin: Skin is warm and  dry. He is not diaphoretic.  Scattered excoriated papular rash that is erythematous on posterior thighs, it is nontender, there is no warmth   Patient was given an albuterol nebulizer treatment in the office. He did not tolerate this and became nauseous. He did vomit once. Is nonbloody and nonbilious. He was observed in the office and began to feel better after about 10 minutes. He was back to his baseline from when he came into the office prior to leaving the office.  Assessment/Plan: Please see individual problem list.  Influenza Patient with positive rapid flu test in the office. Symptoms most consistent with influenza. Given shortness of breath and congestion and abnormal breath sounds we'll obtain a chest x-ray to ensure there is no underlying secondary pneumonia. He was given an albuterol nebulizer treatment in the office given his wheezing though he did not tolerate this and had nausea and vomiting. This is added to his allergy list. He is back to his baseline prior to leaving the office. We will treat with Tamiflu. We'll start on prednisone given wheezing as well. He'll monitor his blood sugars on this. Suspect rash is either viral or related to his Mucinex. He was advised not to take Mucinex anymore. He is given return precautions. If not improving in 2 days he will return to the office.   Orders Placed This Encounter  Procedures  . DG Chest 2 View    Standing Status:   Future    Number of Occurrences:   1    Standing  Expiration Date:   03/02/2018    Order Specific Question:   Reason for Exam (SYMPTOM  OR DIAGNOSIS REQUIRED)    Answer:   cough, wheezing, coarse breath sounds, shortness of breath    Order Specific Question:   Preferred imaging location?    Answer:   [facility]  . POCT Influenza A/B    Meds ordered this encounter  Medications  . predniSONE (DELTASONE) 20 MG tablet    Sig: Take 2 tablets (40 mg total) by mouth daily with breakfast.    Dispense:  10  tablet    Refill:  0  . oseltamivir (TAMIFLU) 75 MG capsule    Sig: Take 1 capsule (75 mg total) by mouth 2 (two) times daily.    Dispense:  10 capsule    Refill:  0    [name], MD [place]

## 2017-01-02 NOTE — Assessment & Plan Note (Addendum)
Patient with positive rapid flu test in the office. Symptoms most consistent with influenza. Given shortness of breath and congestion and abnormal breath sounds we'll obtain a chest x-ray to ensure there is no underlying secondary pneumonia. He was given an albuterol nebulizer treatment in the office given his wheezing though he did not tolerate this and had nausea and vomiting. This is added to his allergy list. He is back to his baseline prior to leaving the office. We will treat with Tamiflu. We'll start on prednisone given wheezing as well. He'll monitor his blood sugars on this. Suspect rash is either viral or related to his Mucinex. He was advised not to take Mucinex anymore. He is given return precautions. If not improving in 2 days he will return to the office.

## 2017-01-02 NOTE — Patient Instructions (Signed)
Nice to see you. You have influenza. We will treat with Tamiflu and prednisone. Will obtain a chest x-ray. You should not take Mucinex anymore given that you developed a rash. The rash could additionally be related to your viral illness. The prednisone should help with the rash. If you develop chest pressure, trouble breathing, cough productive of blood, persistent fevers, or any new or change in symptoms please seek medical attention immediately.

## 2017-01-14 ENCOUNTER — Ambulatory Visit (INDEPENDENT_AMBULATORY_CARE_PROVIDER_SITE_OTHER): Payer: BLUE CROSS/BLUE SHIELD | Admitting: Family Medicine

## 2017-01-14 ENCOUNTER — Encounter: Payer: Self-pay | Admitting: Family Medicine

## 2017-01-14 VITALS — BP 132/80 | HR 81 | Temp 98.4°F | Wt 273.2 lb

## 2017-01-14 DIAGNOSIS — E1159 Type 2 diabetes mellitus with other circulatory complications: Secondary | ICD-10-CM

## 2017-01-14 DIAGNOSIS — G4710 Hypersomnia, unspecified: Secondary | ICD-10-CM

## 2017-01-14 DIAGNOSIS — J0110 Acute frontal sinusitis, unspecified: Secondary | ICD-10-CM | POA: Insufficient documentation

## 2017-01-14 DIAGNOSIS — M109 Gout, unspecified: Secondary | ICD-10-CM

## 2017-01-14 DIAGNOSIS — Z1211 Encounter for screening for malignant neoplasm of colon: Secondary | ICD-10-CM

## 2017-01-14 DIAGNOSIS — I10 Essential (primary) hypertension: Secondary | ICD-10-CM | POA: Diagnosis not present

## 2017-01-14 LAB — COMPREHENSIVE METABOLIC PANEL
ALBUMIN: 4.1 g/dL (ref 3.5–5.2)
ALK PHOS: 68 U/L (ref 39–117)
ALT: 30 U/L (ref 0–53)
AST: 26 U/L (ref 0–37)
BUN: 26 mg/dL — ABNORMAL HIGH (ref 6–23)
CO2: 29 mEq/L (ref 19–32)
Calcium: 9.4 mg/dL (ref 8.4–10.5)
Chloride: 103 mEq/L (ref 96–112)
Creatinine, Ser: 1.32 mg/dL (ref 0.40–1.50)
GFR: 71.48 mL/min (ref >60.00)
Glucose, Bld: 166 mg/dL — ABNORMAL HIGH (ref 70–99)
POTASSIUM: 4.2 meq/L (ref 3.5–5.1)
Sodium: 139 mEq/L (ref 135–145)
TOTAL PROTEIN: 7.3 g/dL (ref 6.0–8.3)
Total Bilirubin: 0.3 mg/dL (ref 0.2–1.2)

## 2017-01-14 LAB — HEMOGLOBIN A1C: Hgb A1c MFr Bld: 7.9 % — ABNORMAL HIGH (ref 4.6–6.5)

## 2017-01-14 LAB — URIC ACID: URIC ACID, SERUM: 7.6 mg/dL (ref 4.0–7.8)

## 2017-01-14 MED ORDER — AMOXICILLIN-POT CLAVULANATE 875-125 MG PO TABS: 1.0000 | ORAL_TABLET | Freq: Two times a day (BID) | ORAL | 0 refills | Status: DC

## 2017-01-14 MED ORDER — COLCHICINE 0.6 MG PO TABS: ORAL_TABLET | ORAL | 3 refills | Status: DC

## 2017-01-14 MED ORDER — METFORMIN HCL 500 MG PO TABS: 500.0000 mg | ORAL_TABLET | Freq: Two times a day (BID) | ORAL | 1 refills | Status: DC

## 2017-01-14 NOTE — Assessment & Plan Note (Signed)
At goal. Continue current medications. 

## 2017-01-14 NOTE — Progress Notes (Signed)
[name], MD Phone: [phone]  SANTONIO SPEAKMAN is a 61 y.o. male who presents today for f/u.  HYPERTENSION  Disease Monitoring  Home BP Monitoring not checking Chest pain- no    Dyspnea- stable, chronic, has not progressed, has seen cardiology for this previously Medications  Compliance-  Taking metoprolol, lisinopril  Edema- no  DIABETES Disease Monitoring: Blood Sugar ranges-not checking Polyuria/phagia/dipsia- some polyuria for a few days      Medications: Compliance- taking metformin Hypoglycemic symptoms- no  Patient notes continued cough. Notes he still feels congested in his sinuses and chest. He overall feels better though the congestion has actually gotten worse. He is coughing up thick white mucus. No fevers. No ear pain. He was diagnosed with influenza previously about 2 weeks ago. Notes he is breathing at his baseline. Does note some postnasal drip.  Patient does report he snores. He does not wake up well rested. No reported apneic episodes. He will fall asleep in front of the TV very easily.  Gout: It has been several months since his last flare. He's been taking colchicine and allopurinol.  PMH: nonsmoker.   ROS see history of present illness  Objective  Physical Exam Vitals:   01/14/17 0856 01/14/17 0915  BP: 140/88 132/80  Pulse: 81   Temp: 98.4 F (36.9 C)     BP Readings from Last 3 Encounters:  01/14/17 132/80  01/02/17 110/70  12/11/16 (!) 150/100   Wt Readings from Last 3 Encounters:  01/14/17 273 lb 3.2 oz (123.9 kg)  01/02/17 273 lb (123.8 kg)  12/11/16 279 lb 4 oz (126.7 kg)    Physical Exam  Constitutional: No distress.  HENT:  Head: Normocephalic and atraumatic.  Mouth/Throat: Oropharynx is clear and moist. No oropharyngeal exudate.  Normal TMs bilaterally  Eyes: Conjunctivae are normal. Pupils are equal, round, and reactive to light.  Cardiovascular: Normal rate, regular rhythm and normal heart sounds.   Pulmonary/Chest:  Effort normal and breath sounds normal.  Musculoskeletal: He exhibits no edema.  Neurological: He is alert. Gait normal.  Skin: Skin is warm and dry. He is not diaphoretic.     Assessment/Plan: Please see individual problem list.  Essential hypertension At goal. Continue current medications.  Diabetes ([place]) Continue metformin. Check A1c.  Gout Has been a number of months since last flare. We'll check a uric acid.  Sinusitis, acute frontal Suspect bacterial sinusitis following influenza illness earlier this month given patient's persistent sinus pressure and congestion and cough. We will treat with Augmentin. Advised on yogurt or probiotic. Given return precautions.  Hypersomnia Patient with hypersomnia and snoring. Concern is for  sleep apnea. Given hypertension and sleep apnea symptoms we will check a sleep study.    Orders Placed This Encounter  Procedures  . HgB A1c  . Comp Met (CMET)  . Uric acid  . Ambulatory referral to Gastroenterology    Referral Priority:   Routine    Referral Type:   Consultation    Referral Reason:   Specialty Services Required    Number of Visits Requested:   1  . Split night study    Standing Status:   Future    Standing Expiration Date:   01/14/2018    Order Specific Question:   Where should this test be performed:    Answer:   [place]    Meds ordered this encounter  Medications  . metFORMIN (GLUCOPHAGE) 500 MG tablet    Sig: Take 1 tablet (500 mg total) by mouth  2 (two) times daily with a meal.    Dispense:  180 tablet    Refill:  1  . colchicine (COLCRYS) 0.6 MG tablet    Sig: TAKE 1 TABLET(0.6 MG) BY MOUTH DAILY    Dispense:  30 tablet    Refill:  3  . amoxicillin-clavulanate (AUGMENTIN) 875-125 MG tablet    Sig: Take 1 tablet by mouth 2 (two) times daily.    Dispense:  14 tablet    Refill:  0    # Healthcare maintenance: GI referral for colonoscopy placed.   [name], MD [place]

## 2017-01-14 NOTE — Assessment & Plan Note (Addendum)
Continue metformin.  Check A1c. 

## 2017-01-14 NOTE — Assessment & Plan Note (Signed)
Has been a number of months since last flare. We'll check a uric acid.

## 2017-01-14 NOTE — Assessment & Plan Note (Signed)
Patient with hypersomnia and snoring. Concern is for  sleep apnea. Given hypertension and sleep apnea symptoms we will check a sleep study.

## 2017-01-14 NOTE — Progress Notes (Signed)
Pre visit review using our clinic review tool, if applicable. No additional management support is needed unless otherwise documented below in the visit note. 

## 2017-01-14 NOTE — Assessment & Plan Note (Signed)
Suspect bacterial sinusitis following influenza illness earlier this month given patient's persistent sinus pressure and congestion and cough. We will treat with Augmentin. Advised on yogurt or probiotic. Given return precautions.

## 2017-01-14 NOTE — Patient Instructions (Signed)
Nice to see you. We will check some lab work today and contacted with the results. We will start you on Augmentin for your sinus infection. We will get you referred for a sleep study as well. If you develop shortness of breath, cough or blood, or any fevers, or any new or changing symptoms please seek medical attention immediately.

## 2017-01-18 ENCOUNTER — Other Ambulatory Visit: Payer: Self-pay | Admitting: Family Medicine

## 2017-01-18 MED ORDER — ALLOPURINOL 100 MG PO TABS: 200.0000 mg | ORAL_TABLET | Freq: Two times a day (BID) | ORAL | 2 refills | Status: DC

## 2017-02-13 ENCOUNTER — Encounter: Payer: Self-pay | Admitting: Family Medicine

## 2017-04-16 ENCOUNTER — Ambulatory Visit: Payer: BLUE CROSS/BLUE SHIELD | Admitting: Family Medicine

## 2017-07-11 ENCOUNTER — Ambulatory Visit: Payer: BLUE CROSS/BLUE SHIELD | Admitting: Nurse Practitioner

## 2017-07-11 ENCOUNTER — Encounter: Payer: Self-pay | Admitting: Nurse Practitioner

## 2017-07-12 ENCOUNTER — Encounter: Payer: Self-pay | Admitting: Nurse Practitioner

## 2017-08-07 ENCOUNTER — Emergency Department: Payer: BLUE CROSS/BLUE SHIELD

## 2017-08-07 ENCOUNTER — Telehealth: Payer: Self-pay | Admitting: Family Medicine

## 2017-08-07 ENCOUNTER — Emergency Department
Admission: EM | Admit: 2017-08-07 | Discharge: 2017-08-07 | Disposition: A | Discharge status: Home / Self Care | Payer: BLUE CROSS/BLUE SHIELD | Attending: Emergency Medicine | Admitting: Emergency Medicine

## 2017-08-07 DIAGNOSIS — Z79899 Other long term (current) drug therapy: Secondary | ICD-10-CM | POA: Insufficient documentation

## 2017-08-07 DIAGNOSIS — E119 Type 2 diabetes mellitus without complications: Secondary | ICD-10-CM | POA: Diagnosis not present

## 2017-08-07 DIAGNOSIS — I10 Essential (primary) hypertension: Secondary | ICD-10-CM | POA: Diagnosis not present

## 2017-08-07 DIAGNOSIS — Z7982 Long term (current) use of aspirin: Secondary | ICD-10-CM | POA: Diagnosis not present

## 2017-08-07 DIAGNOSIS — R42 Dizziness and giddiness: Secondary | ICD-10-CM | POA: Diagnosis present

## 2017-08-07 DIAGNOSIS — Z7984 Long term (current) use of oral hypoglycemic drugs: Secondary | ICD-10-CM | POA: Insufficient documentation

## 2017-08-07 LAB — BASIC METABOLIC PANEL
Anion gap: 10 (ref 5–15)
BUN: 18 mg/dL (ref 6–20)
CHLORIDE: 100 mmol/L — AB (ref 101–111)
CO2: 26 mmol/L (ref 22–32)
Calcium: 9.7 mg/dL (ref 8.9–10.3)
Creatinine, Ser: 1.17 mg/dL (ref 0.61–1.24)
Glucose, Bld: 130 mg/dL — ABNORMAL HIGH (ref 65–99)
POTASSIUM: 4.1 mmol/L (ref 3.5–5.1)
SODIUM: 136 mmol/L (ref 135–145)

## 2017-08-07 LAB — CBC
HCT: 36.3 % — ABNORMAL LOW (ref 40.0–52.0)
Hemoglobin: 12.9 g/dL — ABNORMAL LOW (ref 13.0–18.0)
MCH: 32.9 pg (ref 26.0–34.0)
MCHC: 35.4 g/dL (ref 32.0–36.0)
MCV: 93 fL (ref 80.0–100.0)
Platelets: 236 10*3/uL (ref 150–440)
RBC: 3.91 MIL/uL — AB (ref 4.40–5.90)
RDW: 13.4 % (ref 11.5–14.5)
WBC: 5.2 10*3/uL (ref 3.8–10.6)

## 2017-08-07 LAB — TROPONIN I

## 2017-08-07 MED ORDER — ISOSORBIDE MONONITRATE ER 60 MG PO TB24
30.0000 mg | ORAL_TABLET | Freq: Every day | ORAL | Status: DC
  Administered 2017-08-07: 30 mg via ORAL
  Filled 2017-08-07: qty 1

## 2017-08-07 MED ORDER — LISINOPRIL 20 MG PO TABS: 20.0000 mg | ORAL_TABLET | Freq: Every day | ORAL | 1 refills | Status: DC

## 2017-08-07 MED ORDER — METOPROLOL TARTRATE 25 MG PO TABS: 25.0000 mg | ORAL_TABLET | Freq: Two times a day (BID) | ORAL | 1 refills | Status: DC

## 2017-08-07 MED ORDER — ISOSORBIDE MONONITRATE ER 30 MG PO TB24: 30.0000 mg | ORAL_TABLET | Freq: Every day | ORAL | 1 refills | Status: DC

## 2017-08-07 MED ORDER — METOPROLOL TARTRATE 25 MG PO TABS
25.0000 mg | ORAL_TABLET | Freq: Once | ORAL | Status: AC
  Administered 2017-08-07: 25 mg via ORAL
  Filled 2017-08-07: qty 1

## 2017-08-07 MED ORDER — LISINOPRIL 10 MG PO TABS
20.0000 mg | ORAL_TABLET | Freq: Once | ORAL | Status: AC
  Administered 2017-08-07: 20 mg via ORAL
  Filled 2017-08-07: qty 2

## 2017-08-07 NOTE — Telephone Encounter (Signed)
Pt called about his BP was taken today and it was 237/135 pt has been feeling dizzy. No appt avail. Pt was transferred to [facility]. Thank you!

## 2017-08-07 NOTE — Telephone Encounter (Signed)
fyi

## 2017-08-07 NOTE — ED Provider Notes (Signed)
[facility] Emergency Department Provider Note  Time seen: 1:51 PM  I have reviewed the triage vital signs and the nursing notes.   HISTORY  Chief Complaint Hypertension and Dizziness    HPI Roger [name] is a 61 y.o. male With a past medical history of diabetes, hypertension, presents to the emergency department for high blood pressure and dizziness. According to the patient he was on his way to work this morning when he began feeling dizzy like his blood pressure to be elevated. He went to a pharmacy and checked it was extremely elevated so he came to the emergency department for evaluation. Patient denies any chest pain, states he was having some mild shortness of breath which has since resolved. Denies any current dizziness. Patient states he does not like the way his blood pressure medication makes him feel so he has not taken medications for greater than 1 month. Overall the patient appears very well currently, no distress.  Past Medical History:  Diagnosis Date  . Chest pain    a. 04/2016 MV: small, mild apical defect c/w apical ischemia, EF 55-65%-->Low risk study.  . Diabetes mellitus without complication ([place])   . Hematochezia   . History of echocardiogram    a. 04/2016 Echo: EF 55-60%, mild LVH, no rwma, midly dil LA.  Marland Kitchen Hypertension   . Morbid obesity [facility])     Patient Active Problem List   Diagnosis Date Noted  . Sinusitis, acute frontal 01/14/2017  . Hypersomnia 01/14/2017  . Influenza 01/02/2017  . Obesity 10/12/2016  . Paresthesia of left lower extremity 09/11/2016  . Rectal fissure 09/11/2016  . Gout 05/18/2016  . Erectile dysfunction 05/18/2016  . Stomach discomfort 05/18/2016  . Diabetes ([place]) 05/18/2016  . Chest pain 04/16/2016  . Blood in stool 04/16/2016  . Neuropathy 04/16/2016  . Knee pain 04/16/2016  . Essential hypertension 04/16/2016    Past Surgical History:  Procedure Laterality Date  . thumb surgery      Prior  to Admission medications   Medication Sig Start Date End Date Taking? Authorizing Provider  allopurinol (ZYLOPRIM) 100 MG tablet Take 2 tablets (200 mg total) by mouth 2 (two) times daily. 01/18/17   Leone Haven, MD  amoxicillin-clavulanate (AUGMENTIN) 875-125 MG tablet Take 1 tablet by mouth 2 (two) times daily. 01/14/17   Leone Haven, MD  aspirin EC 81 MG tablet Take 1 tablet (81 mg total) by mouth daily. 09/12/16   Wende Bushy, MD  colchicine (COLCRYS) 0.6 MG tablet TAKE 1 TABLET(0.6 MG) BY MOUTH DAILY 01/14/17   Leone Haven, MD  ibuprofen (ADVIL,MOTRIN) 800 MG tablet Take 1 tablet (800 mg total) by mouth every 8 (eight) hours as needed. 09/11/16   Leone Haven, MD  isosorbide mononitrate (IMDUR) 30 MG 24 hr tablet Take 1 tablet (30 mg total) by mouth daily. 09/12/16 12/11/16  Wende Bushy, MD  lisinopril (PRINIVIL,ZESTRIL) 20 MG tablet Take 1 tablet by mouth as needed. Reported on 05/07/2016    [provider]  metFORMIN (GLUCOPHAGE) 500 MG tablet Take 1 tablet (500 mg total) by mouth 2 (two) times daily with a meal. 01/14/17   Leone Haven, MD  metoprolol tartrate (LOPRESSOR) 25 MG tablet Take 1 tablet (25 mg total) by mouth 2 (two) times daily. 04/16/16   Leone Haven, MD  pantoprazole (PROTONIX) 40 MG tablet Take 1 tablet (40 mg total) by mouth daily. 11/11/15 12/11/16  Nena Polio, MD    Allergies  Allergen Reactions  . Albuterol Nausea And Vomiting  . Mucinex [Guaifenesin Er] Rash    Family History  Problem Relation Age of Onset  . Heart Problems Mother   . Heart disease Unknown   . Hypertension Unknown   . Diabetes Unknown     Social History Social History  Substance Use Topics  . Smoking status: Never Smoker  . Smokeless tobacco: Never Used  . Alcohol use 3.6 oz/week    2 Cans of beer, 4 Shots of liquor per week     Comment: everyday.    Review of Systems Constitutional: Negative for fever.positive for dizziness, now  resolved ENT: Negative for congestion Cardiovascular: Negative for chest pain. Respiratory: mild shortness of breath, now resolved Gastrointestinal: Negative for abdominal pain Musculoskeletal: Negative for back pain. Neurological: Negative for headache All other ROS negative  ____________________________________________   PHYSICAL EXAM:  VITAL SIGNS: ED Triage Vitals  Enc Vitals Group     BP 08/07/17 1032 (!) 225/109     Pulse Rate 08/07/17 1032 64     Resp 08/07/17 1032 18     Temp 08/07/17 1032 98.6 F (37 C)     Temp Source 08/07/17 1032 Oral     SpO2 08/07/17 1032 99 %     Weight 08/07/17 1033 250 lb (113.4 kg)     Height 08/07/17 1033 5\' 10"  (1.778 m)     Head Circumference --      Peak Flow --      Pain Score --      Pain Loc --      Pain Edu? --      Excl. in [place]? --    Constitutional: Alert and oriented. Well appearing and in no distress. Eyes: Normal exam ENT   Head: Normocephalic and atraumatic.   Mouth/Throat: Mucous membranes are moist. Cardiovascular: Normal rate, regular rhythm. No murmur Respiratory: Normal respiratory effort without tachypnea nor retractions. Breath sounds are clear  Gastrointestinal: Soft and nontender. No distention. Musculoskeletal: Nontender with normal range of motion in all extremities.  Neurologic:  Normal speech and language. No gross focal neurologic deficits  Skin:  Skin is warm, dry and intact.  Psychiatric: Mood and affect are normal.   ____________________________________________    EKG  EKG reviewed and interpreted by myself shows normal sinus rhythm at 67 bpm, narrow QRS, normal axis, normal intervals, nonspecific ST changes without elevation  ____________________________________________    RADIOLOGY  chest x-ray negative  ____________________________________________   INITIAL IMPRESSION / ASSESSMENT AND PLAN / ED COURSE  Pertinent labs & imaging results that were available during my care of the  patient were reviewed by me and considered in my medical decision making (see chart for details).  patient seen in the emergency department for high blood pressure and dizziness. Patient's exam is normal. Normal neurological exam. EKG and labs are normal. Patient's blood pressure remained significantly elevated with the patient is not taking his medications. We will dose his normal medications in the emergency department and continue to closely monitor.  blood pressure down 171/103, we will discharge the patient home at this time with prescriptions for his blood pressure medications.  ____________________________________________   FINAL CLINICAL IMPRESSION(S) / ED DIAGNOSES  hypertension    Harvest Dark, MD 08/07/17 1537

## 2017-08-07 NOTE — Telephone Encounter (Signed)
noted 

## 2017-08-07 NOTE — Telephone Encounter (Signed)
Patient Name: MAXXON SCHWANKE  DOB: 13-Feb-1956    Initial Comment Caller states his BP 237/135. Dizziness.   Nurse Assessment  Nurse: Leilani Merl, RN, Heather Date/Time (Eastern Time): 08/07/2017 9:47:10 AM  Confirm and document reason for call. If symptomatic, describe symptoms. ---Caller states his BP 237/135. Dizziness. The dizziness started about a week ago but is worse today.  Does the patient have any new or worsening symptoms? ---Yes  Will a triage be completed? ---Yes  Related visit to physician within the last 2 weeks? ---No  Does the PT have any chronic conditions? (i.e. diabetes, asthma, etc.) ---Yes  List chronic conditions. ---See MR  Is this a behavioral health or substance abuse call? ---No     Guidelines    Guideline Title Affirmed Question Affirmed Notes  High Blood Pressure [6] Systolic BP >= 295 OR Diastolic >= 284 AND [1] cardiac or neurologic symptoms (e.g., chest pain, difficulty breathing, unsteady gait, blurred vision)    Final Disposition User   Go to ED Now [place], RN, [facility] - ED   Disagree/Comply: Comply

## 2017-08-07 NOTE — Telephone Encounter (Signed)
Noted. Patient appears to be in the ED at this time.

## 2017-11-13 ENCOUNTER — Ambulatory Visit: Payer: Self-pay | Admitting: *Deleted

## 2017-11-13 ENCOUNTER — Emergency Department
Admission: EM | Admit: 2017-11-13 | Discharge: 2017-11-13 | Disposition: A | Discharge status: Home / Self Care | Payer: BLUE CROSS/BLUE SHIELD | Attending: Emergency Medicine | Admitting: Emergency Medicine

## 2017-11-13 ENCOUNTER — Other Ambulatory Visit: Payer: Self-pay

## 2017-11-13 ENCOUNTER — Encounter: Payer: Self-pay | Admitting: Emergency Medicine

## 2017-11-13 DIAGNOSIS — I10 Essential (primary) hypertension: Secondary | ICD-10-CM | POA: Diagnosis not present

## 2017-11-13 DIAGNOSIS — Z7982 Long term (current) use of aspirin: Secondary | ICD-10-CM | POA: Diagnosis not present

## 2017-11-13 DIAGNOSIS — K625 Hemorrhage of anus and rectum: Secondary | ICD-10-CM | POA: Diagnosis present

## 2017-11-13 DIAGNOSIS — Z79899 Other long term (current) drug therapy: Secondary | ICD-10-CM | POA: Diagnosis not present

## 2017-11-13 DIAGNOSIS — E119 Type 2 diabetes mellitus without complications: Secondary | ICD-10-CM | POA: Insufficient documentation

## 2017-11-13 LAB — COMPREHENSIVE METABOLIC PANEL
ALBUMIN: 4 g/dL (ref 3.5–5.0)
ALT: 31 U/L (ref 17–63)
AST: 40 U/L (ref 15–41)
Alkaline Phosphatase: 98 U/L (ref 38–126)
Anion gap: 4 — ABNORMAL LOW (ref 5–15)
BUN: 18 mg/dL (ref 6–20)
CHLORIDE: 103 mmol/L (ref 101–111)
CO2: 27 mmol/L (ref 22–32)
Calcium: 9 mg/dL (ref 8.9–10.3)
Creatinine, Ser: 1.49 mg/dL — ABNORMAL HIGH (ref 0.61–1.24)
GFR calc Af Amer: 58 mL/min — ABNORMAL LOW (ref >60)
GFR, EST NON AFRICAN AMERICAN: 50 mL/min — AB (ref >60)
Glucose, Bld: 117 mg/dL — ABNORMAL HIGH (ref 65–99)
POTASSIUM: 3.4 mmol/L — AB (ref 3.5–5.1)
Sodium: 134 mmol/L — ABNORMAL LOW (ref 135–145)
Total Bilirubin: 0.5 mg/dL (ref 0.3–1.2)
Total Protein: 7.6 g/dL (ref 6.5–8.1)

## 2017-11-13 LAB — URINALYSIS, COMPLETE (UACMP) WITH MICROSCOPIC
BILIRUBIN URINE: NEGATIVE
Bacteria, UA: NONE SEEN
Glucose, UA: NEGATIVE mg/dL
Hgb urine dipstick: NEGATIVE
Ketones, ur: NEGATIVE mg/dL
Leukocytes, UA: NEGATIVE
Nitrite: NEGATIVE
Protein, ur: NEGATIVE mg/dL
RBC / HPF: NONE SEEN RBC/hpf (ref 0–5)
Specific Gravity, Urine: 1.016 (ref 1.005–1.030)
Squamous Epithelial / LPF: NONE SEEN
pH: 5 (ref 5.0–8.0)

## 2017-11-13 LAB — CBC
HCT: 34.9 % — ABNORMAL LOW (ref 40.0–52.0)
Hemoglobin: 11.8 g/dL — ABNORMAL LOW (ref 13.0–18.0)
MCH: 32.5 pg (ref 26.0–34.0)
MCHC: 33.8 g/dL (ref 32.0–36.0)
MCV: 96.4 fL (ref 80.0–100.0)
PLATELETS: 307 10*3/uL (ref 150–440)
RBC: 3.62 MIL/uL — ABNORMAL LOW (ref 4.40–5.90)
RDW: 12.7 % (ref 11.5–14.5)
WBC: 6.2 10*3/uL (ref 3.8–10.6)

## 2017-11-13 MED ORDER — HYDROCORTISONE 1 % EX CREA: 1.0000 "application " | TOPICAL_CREAM | Freq: Two times a day (BID) | CUTANEOUS | 0 refills | Status: DC

## 2017-11-13 MED ORDER — PSYLLIUM 28 % PO PACK: 1.0000 | PACK | Freq: Two times a day (BID) | ORAL | 0 refills | Status: DC

## 2017-11-13 NOTE — ED Notes (Addendum)
Dr. Clearnce Hasten at bedside to perform fecal occult.   Pt denies straining to have BM. Hx hemorroids. Pt states he eats "mainly meat". Dr. Clearnce Hasten going over diet changes with pt and medications he is going to prescribe. States rectal bleeding x 3 weeks. Pt called PCP and was told to come to ER to be checked.   Pt states he was scheduled for colonoscopy but didn't have it done previously with these symptoms.

## 2017-11-13 NOTE — Telephone Encounter (Signed)
Patient states when this happens he has enough bleeding to turn water red and is starting to feel weak, should patient be seen at ER?

## 2017-11-13 NOTE — Telephone Encounter (Signed)
Needs to be seen today. I would suggest [place] urgent [facility] if he is willing to go there or ED given he is beginning to be symptomatic. He could try the walk in clinic though they will likely send him to the ED.

## 2017-11-13 NOTE — ED Provider Notes (Signed)
[facility] Emergency Department Provider Note  ___________________________________________   None    (approximate)  I have reviewed the triage vital signs and the nursing notes.   HISTORY  Chief Complaint Rectal Bleeding   HPI Roger [name] is a 61 y.o. male with a history of bloody stools who is presenting to the emergency department today with bleeding per rectum over the past 3 weeks.  He says that he has blood most of the time when he moves his bowels.  Says that he has moved his bowels once today early in the morning before 9 AM and had a clot about half the size of his pinky present.  He said the stool appeared [place].  He says that his bowel movements are well formed and he rarely needs to push.  However, he says that he does not eat fruits or vegetables in his diet and eats mostly meat.  He called his primary care doctor this morning after the bowel movement with blood in it and was told to report to the emergency department for further evaluation.  However, he had to work until 8:00 tonight was not able to come to the emergency department until now.  Patient denies taking any blood thinners and says that he no longer takes aspirin.   Past Medical History:  Diagnosis Date  . Chest pain    a. 04/2016 MV: small, mild apical defect c/w apical ischemia, EF 55-65%-->Low risk study.  . Diabetes mellitus without complication ([place])   . Hematochezia   . History of echocardiogram    a. 04/2016 Echo: EF 55-60%, mild LVH, no rwma, midly dil LA.  Marland Kitchen Hypertension   . Morbid obesity [facility])     Patient Active Problem List   Diagnosis Date Noted  . Sinusitis, acute frontal 01/14/2017  . Hypersomnia 01/14/2017  . Influenza 01/02/2017  . Obesity 10/12/2016  . Paresthesia of left lower extremity 09/11/2016  . Rectal fissure 09/11/2016  . Gout 05/18/2016  . Erectile dysfunction 05/18/2016  . Stomach discomfort 05/18/2016  . Diabetes ([place]) 05/18/2016  . Chest  pain 04/16/2016  . Blood in stool 04/16/2016  . Neuropathy 04/16/2016  . Knee pain 04/16/2016  . Essential hypertension 04/16/2016    Past Surgical History:  Procedure Laterality Date  . thumb surgery      Prior to Admission medications   Medication Sig Start Date End Date Taking? Authorizing Provider  allopurinol (ZYLOPRIM) 100 MG tablet Take 2 tablets (200 mg total) by mouth 2 (two) times daily. Patient taking differently: Take 100 mg by mouth daily.  01/18/17   Leone Haven, MD  aspirin EC 81 MG tablet Take 1 tablet (81 mg total) by mouth daily. Patient not taking: Reported on 08/07/2017 09/12/16   Wende Bushy, MD  colchicine (COLCRYS) 0.6 MG tablet TAKE 1 TABLET(0.6 MG) BY MOUTH DAILY 01/14/17   Leone Haven, MD  ibuprofen (ADVIL,MOTRIN) 800 MG tablet Take 1 tablet (800 mg total) by mouth every 8 (eight) hours as needed. Patient not taking: Reported on 08/07/2017 09/11/16   Leone Haven, MD  isosorbide mononitrate (IMDUR) 30 MG 24 hr tablet Take 1 tablet (30 mg total) by mouth daily. 08/07/17 11/05/17  Harvest Dark, MD  lisinopril (PRINIVIL,ZESTRIL) 20 MG tablet Take 1 tablet (20 mg total) by mouth daily. 08/07/17 08/07/18  Harvest Dark, MD  metFORMIN (GLUCOPHAGE) 500 MG tablet Take 1 tablet (500 mg total) by mouth 2 (two) times daily with a meal. Patient taking differently:  Take 500 mg by mouth daily with breakfast.  01/14/17   Leone Haven, MD  metoprolol tartrate (LOPRESSOR) 25 MG tablet Take 1 tablet (25 mg total) by mouth 2 (two) times daily. 08/07/17   Harvest Dark, MD  pantoprazole (PROTONIX) 40 MG tablet Take 1 tablet (40 mg total) by mouth daily. Patient not taking: Reported on 08/07/2017 11/11/15 12/11/16  Nena Polio, MD    Allergies Albuterol and Mucinex [guaifenesin er]  Family History  Problem Relation Age of Onset  . Heart Problems Mother   . Heart disease Unknown   . Hypertension Unknown   . Diabetes Unknown     Social  History Social History   Tobacco Use  . Smoking status: Never Smoker  . Smokeless tobacco: Never Used  Substance Use Topics  . Alcohol use: Yes    Alcohol/week: 3.6 oz    Types: 2 Cans of beer, 4 Shots of liquor per week    Comment: everyday.  . Drug use: No    Review of Systems  Constitutional: No fever/chills Eyes: No visual changes. ENT: No sore throat. Cardiovascular: Denies chest pain. Respiratory: Denies shortness of breath. Gastrointestinal: No abdominal pain.  No nausea, no vomiting.  No diarrhea.   Genitourinary: Negative for dysuria. Musculoskeletal: Negative for back pain. Skin: Negative for rash. Neurological: Negative for headaches, focal weakness or numbness.   ____________________________________________   PHYSICAL EXAM:  VITAL SIGNS: ED Triage Vitals  Enc Vitals Group     BP 11/13/17 2016 (!) 183/107     Pulse Rate 11/13/17 2016 74     Resp 11/13/17 2016 20     Temp 11/13/17 2016 98.8 F (37.1 C)     Temp Source 11/13/17 2016 Oral     SpO2 11/13/17 2016 98 %     Weight 11/13/17 2016 250 lb (113.4 kg)     Height 11/13/17 2016 5\' 10"  (1.778 m)     Head Circumference --      Peak Flow --      Pain Score 11/13/17 2119 0     Pain Loc --      Pain Edu? --      Excl. in [place]? --     Constitutional: Alert and oriented. Well appearing and in no acute distress. Eyes: Conjunctivae are normal.  Head: Atraumatic. Nose: No congestion/rhinnorhea. Mouth/Throat: Mucous membranes are moist.  Neck: No stridor.   Cardiovascular: Normal rate, regular rhythm. Grossly normal heart sounds.   Respiratory: Normal respiratory effort.  No retractions. Lungs CTAB. Gastrointestinal: Soft and nontender. No distention.   Small, non-engorged hemorrhoid at 10:00.  Digital rectal exam with grossly Buss stool which is heme positive.  Musculoskeletal: No lower extremity tenderness nor edema.   Neurologic:  Normal speech and language. No gross focal neurologic deficits are  appreciated. Skin:  Skin is warm, dry and intact. No rash noted. Psychiatric: Mood and affect are normal. Speech and behavior are normal.  ____________________________________________   LABS (all labs ordered are listed, but only abnormal results are displayed)  Labs Reviewed  CBC - Abnormal; Notable for the following components:      Result Value   RBC 3.62 (*)    Hemoglobin 11.8 (*)    HCT 34.9 (*)    All other components within normal limits  COMPREHENSIVE METABOLIC PANEL - Abnormal; Notable for the following components:   Sodium 134 (*)    Potassium 3.4 (*)    Glucose, Bld 117 (*)    Creatinine, Ser 1.49 (*)  GFR calc non Af Amer 50 (*)    GFR calc Af Amer 58 (*)    Anion gap 4 (*)    All other components within normal limits  URINALYSIS, COMPLETE (UACMP) WITH MICROSCOPIC - Abnormal; Notable for the following components:   Color, Urine YELLOW (*)    APPearance CLEAR (*)    All other components within normal limits   ____________________________________________  EKG   ____________________________________________  RADIOLOGY   ____________________________________________   PROCEDURES  Procedure(s) performed:   Procedures  Critical Care performed:   ____________________________________________   INITIAL IMPRESSION / ASSESSMENT AND PLAN / ED COURSE  Pertinent labs & imaging results that were available during my care of the patient were reviewed by me and considered in my medical decision making (see chart for details).  DDX: Lower GI bleed, upper GI bleed, hemorrhoid bleed, diverticular bleed, colon cancer As part of my medical decision making, I reviewed the following data within the [place] Notes from prior ED visits    ----------------------------------------- 9:26 PM on 11/13/2017 -----------------------------------------  Patient with 3 weeks of bleeding with stable hemoglobin of 11.8.  Likely hemorrhoid bleed.  Patient has  been recommended in the past when this has happened previously to have a colonoscopy which she has not pursued.  We discussed that this would be very helpful piece of information and that it may reveal other diagnoses.  We discussed a family history of colon cancer with the patient denies but says that there are other cancers in the family.  Patient unsure if he has been losing weight over the past several months but his girlfriend says that she thinks he is getting skinnier.  However, he has not weighed himself and does not report that he has noticed himself thinning.  Patient will be discharged with Preparation H as well as Metamucil.  I encouraged him to follow-up with primary care doctor and will be giving him the telephone number for the gastroenterology office.  He is understanding of the diagnosis as well as treatment plan willing to comply.  ____________________________________________   FINAL CLINICAL IMPRESSION(S) / ED DIAGNOSES  GI bleeding.    NEW MEDICATIONS STARTED DURING THIS VISIT:  This SmartLink is deprecated. Use AVSMEDLIST instead to display the medication list for a patient.   Note:  This document was prepared using Dragon voice recognition software and may include unintentional dictation errors.     Orbie Pyo, MD 11/13/17 2128

## 2017-11-13 NOTE — Telephone Encounter (Addendum)
Patient wants to be seen at PCP office- he has had bleeding with BM for 3 weeks and it is not going away. He is beginning to get fatigued.  Reason for Disposition . MILD rectal bleeding (more than just a few drops or streaks)  Answer Assessment - Initial Assessment Questions 1. APPEARANCE of BLOOD: "What color is it?" "Is it passed separately, on the surface of the stool, or mixed in with the stool?"      Bright red- to dark with clots- hard for patient to tell- feels that it it may be seperate 2. AMOUNT: "How much blood was passed?"      Very noticeable- turns water in toilet red 3. FREQUENCY: "How many times has blood been passed with the stools?"      Every stool 4. ONSET: "When was the blood first seen in the stools?" (Days or weeks)      3 weeks 5. DIARRHEA: "Is there also some diarrhea?" If so, ask: "How many diarrhea stools were passed in past 24 hours?"      No loose stools- maybe once 6. CONSTIPATION: "Do you have constipation?" If so, "How bad is it?"     No problems with constipation 7. RECURRENT SYMPTOMS: "Have you had blood in your stools before?" If so, ask: "When was the last time?" and "What happened that time?"      Yes- was told to follow up with GI- no follow up 8. BLOOD THINNERS: "Do you take any blood thinners?" (e.g., Coumadin/warfarin, Pradaxa/dabigatran, aspirin)     No- not that he knows 9. OTHER SYMPTOMS: "Do you have any other symptoms?"  (e.g., abdominal pain, vomiting, dizziness, fever)     Weakness- fatigue 10. PREGNANCY: "Is there any chance you are pregnant?" "When was your last menstrual period?"       n/a  Protocols used: RECTAL BLEEDING-A-AH

## 2017-11-13 NOTE — ED Triage Notes (Addendum)
Pt in with co bloody stools x 3 weeks, called PMD and was told that they would not be able to see him till next year. States hx of the same but did not have colonoscopy done that was recommended. Pt denies any abd pain does have a hx of hemorrhoids.

## 2017-11-13 NOTE — Telephone Encounter (Signed)
Noted.  Thank you for contacting him. 

## 2017-11-13 NOTE — Telephone Encounter (Signed)
Patient stated he would be evaluated at [facility] urgent care today. FYI

## 2017-11-14 ENCOUNTER — Telehealth: Payer: Self-pay | Admitting: Family Medicine

## 2017-11-14 DIAGNOSIS — K625 Hemorrhage of anus and rectum: Secondary | ICD-10-CM

## 2017-11-14 NOTE — Telephone Encounter (Signed)
Please follow-up with patient after ED visit to ensure that he gets set up for follow-up. I will also refer him to GI. Thanks.

## 2017-11-20 NOTE — Telephone Encounter (Signed)
Patient has had no visible blood since ED visit in stool has GI appointment set up and FU with PCP first available per schedule 12/27/17

## 2017-11-20 NOTE — Telephone Encounter (Signed)
Noted.  Thanks for following up with him.  He needs to keep the appointment with GI.

## 2017-12-05 ENCOUNTER — Ambulatory Visit: Payer: BLUE CROSS/BLUE SHIELD | Admitting: Gastroenterology

## 2017-12-25 ENCOUNTER — Ambulatory Visit: Payer: BLUE CROSS/BLUE SHIELD | Admitting: Gastroenterology

## 2017-12-25 ENCOUNTER — Encounter: Payer: Self-pay | Admitting: Gastroenterology

## 2017-12-27 ENCOUNTER — Inpatient Hospital Stay: Payer: BLUE CROSS/BLUE SHIELD | Admitting: Family Medicine

## 2017-12-30 ENCOUNTER — Inpatient Hospital Stay: Payer: BLUE CROSS/BLUE SHIELD | Admitting: Family Medicine

## 2018-02-17 ENCOUNTER — Emergency Department: Payer: BLUE CROSS/BLUE SHIELD

## 2018-02-17 ENCOUNTER — Emergency Department
Admission: EM | Admit: 2018-02-17 | Discharge: 2018-02-17 | Disposition: A | Discharge status: Home / Self Care | Payer: BLUE CROSS/BLUE SHIELD | Attending: Emergency Medicine | Admitting: Emergency Medicine

## 2018-02-17 ENCOUNTER — Other Ambulatory Visit: Payer: Self-pay

## 2018-02-17 DIAGNOSIS — M10072 Idiopathic gout, left ankle and foot: Secondary | ICD-10-CM | POA: Diagnosis not present

## 2018-02-17 DIAGNOSIS — M109 Gout, unspecified: Secondary | ICD-10-CM

## 2018-02-17 DIAGNOSIS — E119 Type 2 diabetes mellitus without complications: Secondary | ICD-10-CM | POA: Insufficient documentation

## 2018-02-17 DIAGNOSIS — Z7984 Long term (current) use of oral hypoglycemic drugs: Secondary | ICD-10-CM | POA: Diagnosis not present

## 2018-02-17 DIAGNOSIS — M79672 Pain in left foot: Secondary | ICD-10-CM | POA: Diagnosis present

## 2018-02-17 DIAGNOSIS — I10 Essential (primary) hypertension: Secondary | ICD-10-CM | POA: Insufficient documentation

## 2018-02-17 DIAGNOSIS — Z79899 Other long term (current) drug therapy: Secondary | ICD-10-CM | POA: Diagnosis not present

## 2018-02-17 MED ORDER — COLCHICINE 0.6 MG PO TABS
1.2000 mg | ORAL_TABLET | Freq: Once | ORAL | Status: AC
  Administered 2018-02-17: 1.2 mg via ORAL
  Filled 2018-02-17: qty 2

## 2018-02-17 MED ORDER — PREDNISONE 20 MG PO TABS: 60.0000 mg | ORAL_TABLET | Freq: Every day | ORAL | 0 refills | Status: DC

## 2018-02-17 MED ORDER — PREDNISONE 20 MG PO TABS
60.0000 mg | ORAL_TABLET | Freq: Once | ORAL | Status: AC
  Administered 2018-02-17: 60 mg via ORAL
  Filled 2018-02-17: qty 3

## 2018-02-17 MED ORDER — TRAMADOL HCL 50 MG PO TABS: 50.0000 mg | ORAL_TABLET | Freq: Four times a day (QID) | ORAL | 0 refills | Status: DC | PRN

## 2018-02-17 MED ORDER — OXYCODONE-ACETAMINOPHEN 5-325 MG PO TABS
1.0000 | ORAL_TABLET | Freq: Once | ORAL | Status: AC
  Administered 2018-02-17: 1 via ORAL
  Filled 2018-02-17: qty 1

## 2018-02-17 NOTE — Discharge Instructions (Signed)
Please follow-up with your primary care physician for further evaluation of your symptoms.  Please return with any worsening conditions or any other concerns.

## 2018-02-17 NOTE — ED Provider Notes (Signed)
[facility] Emergency Department Provider Note   ____________________________________________   First MD Initiated Contact with Patient 02/17/18 [phone]     (approximate)  I have reviewed the triage vital signs and the nursing notes.   HISTORY  Chief Complaint Foot Pain    HPI Roger [name] is a 62 y.o. male who comes into the hospital today with some bad gout.  He reports that is on his left big toe and left foot.  The flare started about 9 days ago.   Past Medical History:  Diagnosis Date  . Chest pain    a. 04/2016 MV: small, mild apical defect c/w apical ischemia, EF 55-65%-->Low risk study.  . Diabetes mellitus without complication ([place])   . Hematochezia   . History of echocardiogram    a. 04/2016 Echo: EF 55-60%, mild LVH, no rwma, midly dil LA.  Marland Kitchen Hypertension   . Morbid obesity [facility])     Patient Active Problem List   Diagnosis Date Noted  . Sinusitis, acute frontal 01/14/2017  . Hypersomnia 01/14/2017  . Influenza 01/02/2017  . Obesity 10/12/2016  . Paresthesia of left lower extremity 09/11/2016  . Rectal fissure 09/11/2016  . Gout 05/18/2016  . Erectile dysfunction 05/18/2016  . Stomach discomfort 05/18/2016  . Diabetes ([place]) 05/18/2016  . Chest pain 04/16/2016  . Blood in stool 04/16/2016  . Neuropathy 04/16/2016  . Knee pain 04/16/2016  . Essential hypertension 04/16/2016    Past Surgical History:  Procedure Laterality Date  . thumb surgery      Prior to Admission medications   Medication Sig Start Date End Date Taking? Authorizing Provider  allopurinol (ZYLOPRIM) 100 MG tablet Take 2 tablets (200 mg total) by mouth 2 (two) times daily. Patient taking differently: Take 100 mg by mouth daily.  01/18/17   Leone Haven, MD  aspirin EC 81 MG tablet Take 1 tablet (81 mg total) by mouth daily. Patient not taking: Reported on 08/07/2017 09/12/16   Wende Bushy, MD  colchicine (COLCRYS) 0.6 MG tablet TAKE 1 TABLET(0.6 MG)  BY MOUTH DAILY 01/14/17   Leone Haven, MD  hydrocortisone cream (PREPARATION H HYDROCORTISONE) 1 % Apply 1 application topically 2 (two) times daily. 11/13/17   Orbie Pyo, MD  ibuprofen (ADVIL,MOTRIN) 800 MG tablet Take 1 tablet (800 mg total) by mouth every 8 (eight) hours as needed. Patient not taking: Reported on 08/07/2017 09/11/16   Leone Haven, MD  isosorbide mononitrate (IMDUR) 30 MG 24 hr tablet Take 1 tablet (30 mg total) by mouth daily. 08/07/17 11/05/17  Harvest Dark, MD  lisinopril (PRINIVIL,ZESTRIL) 20 MG tablet Take 1 tablet (20 mg total) by mouth daily. 08/07/17 08/07/18  Harvest Dark, MD  metFORMIN (GLUCOPHAGE) 500 MG tablet Take 1 tablet (500 mg total) by mouth 2 (two) times daily with a meal. Patient taking differently: Take 500 mg by mouth daily with breakfast.  01/14/17   Leone Haven, MD  metoprolol tartrate (LOPRESSOR) 25 MG tablet Take 1 tablet (25 mg total) by mouth 2 (two) times daily. 08/07/17   Harvest Dark, MD  pantoprazole (PROTONIX) 40 MG tablet Take 1 tablet (40 mg total) by mouth daily. Patient not taking: Reported on 08/07/2017 11/11/15 12/11/16  Nena Polio, MD  predniSONE (DELTASONE) 20 MG tablet Take 3 tablets (60 mg total) by mouth daily. 02/17/18   Loney Hering, MD  psyllium (METAMUCIL SMOOTH TEXTURE) 28 % packet Take 1 packet by mouth 2 (two) times daily. 11/13/17 11/13/18  Schaevitz, Randall An, MD  traMADol (ULTRAM) 50 MG tablet Take 1 tablet (50 mg total) by mouth every 6 (six) hours as needed. 02/17/18   Loney Hering, MD    Allergies Albuterol and Mucinex [guaifenesin er]  Family History  Problem Relation Age of Onset  . Heart Problems Mother   . Heart disease Unknown   . Hypertension Unknown   . Diabetes Unknown     Social History Social History   Tobacco Use  . Smoking status: Never Smoker  . Smokeless tobacco: Never Used  Substance Use Topics  . Alcohol use: Yes     Alcohol/week: 3.6 oz    Types: 2 Cans of beer, 4 Shots of liquor per week    Comment: everyday.  . Drug use: No    Review of Systems  Constitutional: No fever/chills Eyes: No visual changes. ENT: No sore throat. Cardiovascular: Denies chest pain. Respiratory: Denies shortness of breath. Gastrointestinal: No abdominal pain.  No nausea, no vomiting.   Genitourinary: Negative for dysuria. Musculoskeletal: left toe and foot pain Skin: Negative for rash. Neurological: Negative for headaches, focal weakness or numbness.   ____________________________________________   PHYSICAL EXAM:  VITAL SIGNS: ED Triage Vitals  Enc Vitals Group     BP 02/17/18 0506 (!) 188/113     Pulse Rate 02/17/18 0506 84     Resp 02/17/18 0506 18     Temp 02/17/18 0506 98.3 F (36.8 C)     Temp Source 02/17/18 0506 Oral     SpO2 02/17/18 0506 97 %     Weight 02/17/18 0504 260 lb (117.9 kg)     Height 02/17/18 0504 5\' 10"  (1.778 m)     Head Circumference --      Peak Flow --      Pain Score 02/17/18 0507 10     Pain Loc --      Pain Edu? --      Excl. in [place]? --     Constitutional: Alert and oriented. Well appearing and in moderate distress. Eyes: Conjunctivae are normal. PERRL. EOMI. Head: Atraumatic. Nose: No congestion/rhinnorhea. Mouth/Throat: Mucous membranes are moist.  Oropharynx non-erythematous. Cardiovascular: Normal rate, regular rhythm. Grossly normal heart sounds.  Good peripheral circulation. Respiratory: Normal respiratory effort.  No retractions. Lungs CTAB. Gastrointestinal: Soft and nontender. No distention. Positive bowel sounds Musculoskeletal: tenderness to palpation at left great toe with some mild warmth and redness  Neurologic:  Normal speech and language.  Skin:  Skin is warm, dry and intact.  Psychiatric: Mood and affect are normal.   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  Official radiology report(s): Dg Foot Complete Left  Result Date: 02/17/2018 CLINICAL DATA:  Acute bilateral foot pain and swelling. EXAM: LEFT FOOT - COMPLETE 3+ VIEW COMPARISON:  None. FINDINGS: There is no evidence of fracture or dislocation. Moderate narrowing of the first metatarsophalangeal joint is noted. Soft tissues are unremarkable. 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 No acute abnormality seen in the left foot. Electronically Signed   By: Marijo Conception, M.D.   On: 02/17/2018 07:30    ____________________________________________   PROCEDURES  Procedure(s) performed: None  Procedures  Critical Care performed: No  ____________________________________________   INITIAL IMPRESSION / ASSESSMENT AND PLAN / ED COURSE  As part of my medical decision making, I reviewed the following data within the electronic MEDICAL RECORD NUMBER Notes from prior ED visits and [place] Controlled Substance Database   This is a 62 year old male who comes into the hospital today with gout.  I did give the patient some prednisone as well as colchicine and a dose of Percocet.  The patient states that his pain is improving.  The patient did receive an x-ray which showed some degenerative joint disease.  He does not take his blood pressure medicines as well.  He will be discharged home to follow-up with with his primary care physician.      ____________________________________________   FINAL CLINICAL IMPRESSION(S) / ED DIAGNOSES  Final diagnoses:  Acute gout involving toe of left foot, unspecified cause  Left foot pain     ED Discharge Orders         Ordered    predniSONE (DELTASONE) 20 MG tablet  Daily     02/17/18 0749    traMADol (ULTRAM) 50 MG tablet  Every 6 hours PRN     02/17/18 0749       Note:  This document was prepared using Dragon voice recognition software and may include unintentional dictation errors.    Loney Hering, MD 02/17/18 [phone]

## 2018-02-17 NOTE — ED Triage Notes (Signed)
Patient reports bilateral foot (more in left) pain for the past nine days.  Reports history of gout and that this feels the same.

## 2018-02-17 NOTE — ED Notes (Signed)
Patient reports history of hypertension, but has not taken his medications in approximately 1 month.

## 2018-02-17 NOTE — ED Notes (Signed)
Patient c/o bilateral foot pain and swelling, with increased pain/swelling in the left foot as opposed to the right X 9 days. Patient has marked swelling to great toe left foot on assessment. Patient reports hx of gout - reports this pain/symptoms are similar to previous gout attacks.

## 2018-02-17 NOTE — ED Notes (Signed)
Patient has been taking ibuprofen for pain with little relief, last dose yesterday.

## 2018-03-04 ENCOUNTER — Ambulatory Visit: Payer: Self-pay | Admitting: *Deleted

## 2018-03-04 NOTE — Telephone Encounter (Signed)
Pt reports increased pain from gout. H/O gout left great toe; "excruciating."  Seen in ED 02/17/18, placed on prednisone x 12 days and Ultram PRN. States minimal relief initially. States mild swelling and "little redness."  Reports is taking Colcrys as ordered. Pt is requesting something else for pain control be called in for him. Pt made aware he would more than likely need to be seen first.  Pt asks to make request to Dr. Caryl Bis prior to making appt. Pt at work and needed to hang up before further discussion re: appt and care advise. Add:  I did make next available appt for him on Thursday at 0900 with J. Kordsmeier and will alert pt to that fact. Instructed to go to UC/ED if symptoms worsen.  Please advise: [phone]  Reason for Disposition . Pain in the big toe joint  Answer Assessment - Initial Assessment Questions 1. ONSET: "When did the pain start?"      Pt has h/o gout. Worsening pain over a week 2. LOCATION: "Where is the pain located?"      Left great toe 3. PAIN: "How bad is the pain?"    (Scale 1-10; or mild, moderate, severe)   -  MILD (1-3): doesn't interfere with normal activities    -  MODERATE (4-7): interferes with normal activities (e.g., work or school) or awakens from sleep, limping    -  SEVERE (8-10): excruciating pain, unable to do any normal activities, unable to walk     "excruciating" 4. WORK OR EXERCISE: "Has there been any recent work or exercise that involved this part of the body?"      no 5. CAUSE: "What do you think is causing the foot pain?"     gout 6. OTHER SYMPTOMS: "Do you have any other symptoms?" (e.g., leg pain, rash, fever, numbness)     Same type pain, mild swelling and "little redness."  Protocols used: FOOT PAIN-A-AH

## 2018-03-04 NOTE — Telephone Encounter (Signed)
Please advise 

## 2018-03-04 NOTE — Telephone Encounter (Signed)
Patient notified and states he is going to go to the walk in or urgent care

## 2018-03-04 NOTE — Telephone Encounter (Signed)
He will need to be seen to be treated. He has not been seen in the office in over a year. He will need to schedule a follow-up visit as well.

## 2018-03-06 ENCOUNTER — Ambulatory Visit: Payer: BLUE CROSS/BLUE SHIELD | Admitting: Family Medicine

## 2018-03-11 ENCOUNTER — Other Ambulatory Visit: Payer: Self-pay | Admitting: Family Medicine

## 2018-03-17 NOTE — Telephone Encounter (Signed)
Okay to refill?   Pt last seen in February 2018. Has cancelled the last 3 appt with Dr. Caryl Bis & 1 with NP.

## 2018-03-17 NOTE — Telephone Encounter (Signed)
He needs to be seen in the office to receive refills. Please get him scheduled. Thanks.

## 2018-03-18 NOTE — Telephone Encounter (Signed)
Please contact the patient to get him set up for follow-up to receive refills. Thanks.

## 2018-03-19 NOTE — Telephone Encounter (Signed)
Patient is scheduled for next available in may, patient states he is completely out of his medication

## 2018-03-20 MED ORDER — COLCHICINE 0.6 MG PO TABS: ORAL_TABLET | ORAL | 0 refills | Status: DC

## 2018-03-20 NOTE — Addendum Note (Signed)
Addended by: Leone Haven on: 03/20/2018 04:20 PM   Modules accepted: Orders

## 2018-03-20 NOTE — Telephone Encounter (Signed)
1 month of colchicine sent to pharmacy.  Please confirm that this is the only medication he is out of.  Thanks.

## 2018-03-21 NOTE — Telephone Encounter (Signed)
He should check to see what he needs refills on and then let us know.

## 2018-03-21 NOTE — Telephone Encounter (Signed)
Patient states he contacted [facility] about refills but he does not remember what he needs refills on right now.

## 2018-04-18 ENCOUNTER — Ambulatory Visit: Payer: BLUE CROSS/BLUE SHIELD | Admitting: Family Medicine

## 2018-04-18 ENCOUNTER — Encounter: Payer: Self-pay | Admitting: Family Medicine

## 2018-04-18 VITALS — BP 152/92 | HR 99 | Temp 98.4°F | Ht 70.0 in | Wt 254.0 lb

## 2018-04-18 DIAGNOSIS — R42 Dizziness and giddiness: Secondary | ICD-10-CM | POA: Diagnosis not present

## 2018-04-18 DIAGNOSIS — I10 Essential (primary) hypertension: Secondary | ICD-10-CM | POA: Diagnosis not present

## 2018-04-18 DIAGNOSIS — D229 Melanocytic nevi, unspecified: Secondary | ICD-10-CM | POA: Diagnosis not present

## 2018-04-18 DIAGNOSIS — T733XXA Exhaustion due to excessive exertion, initial encounter: Secondary | ICD-10-CM | POA: Diagnosis not present

## 2018-04-18 DIAGNOSIS — G629 Polyneuropathy, unspecified: Secondary | ICD-10-CM

## 2018-04-18 DIAGNOSIS — E1159 Type 2 diabetes mellitus with other circulatory complications: Secondary | ICD-10-CM | POA: Diagnosis not present

## 2018-04-18 DIAGNOSIS — I83892 Varicose veins of left lower extremities with other complications: Secondary | ICD-10-CM | POA: Insufficient documentation

## 2018-04-18 DIAGNOSIS — K921 Melena: Secondary | ICD-10-CM | POA: Diagnosis not present

## 2018-04-18 DIAGNOSIS — M109 Gout, unspecified: Secondary | ICD-10-CM

## 2018-04-18 LAB — COMPREHENSIVE METABOLIC PANEL
ALT: 20 U/L (ref 0–53)
AST: 21 U/L (ref 0–37)
Albumin: 4 g/dL (ref 3.5–5.2)
Alkaline Phosphatase: 96 U/L (ref 39–117)
BUN: 18 mg/dL (ref 6–23)
CALCIUM: 10 mg/dL (ref 8.4–10.5)
CHLORIDE: 103 meq/L (ref 96–112)
CO2: 28 meq/L (ref 19–32)
CREATININE: 1.27 mg/dL (ref 0.40–1.50)
GFR: 74.42 mL/min (ref >60.00)
Glucose, Bld: 120 mg/dL — ABNORMAL HIGH (ref 70–99)
POTASSIUM: 3.8 meq/L (ref 3.5–5.1)
Sodium: 141 mEq/L (ref 135–145)
Total Bilirubin: 0.5 mg/dL (ref 0.2–1.2)
Total Protein: 7.4 g/dL (ref 6.0–8.3)

## 2018-04-18 LAB — CBC
HEMATOCRIT: 35.6 % — AB (ref 39.0–52.0)
HEMOGLOBIN: 12.2 g/dL — AB (ref 13.0–17.0)
MCHC: 34.1 g/dL (ref 30.0–36.0)
MCV: 95.7 fl (ref 78.0–100.0)
Platelets: 306 10*3/uL (ref 150.0–400.0)
RBC: 3.72 Mil/uL — AB (ref 4.22–5.81)
RDW: 13.1 % (ref 11.5–15.5)
WBC: 8.9 10*3/uL (ref 4.0–10.5)

## 2018-04-18 LAB — URIC ACID: Uric Acid, Serum: 9.8 mg/dL — ABNORMAL HIGH (ref 4.0–7.8)

## 2018-04-18 LAB — POCT GLYCOSYLATED HEMOGLOBIN (HGB A1C): Hemoglobin A1C: 6.5 % — AB (ref 4.0–5.6)

## 2018-04-18 LAB — TSH: TSH: 2.48 u[IU]/mL (ref 0.35–4.50)

## 2018-04-18 MED ORDER — ISOSORBIDE MONONITRATE ER 30 MG PO TB24: 30.0000 mg | ORAL_TABLET | Freq: Every day | ORAL | 1 refills | Status: DC

## 2018-04-18 MED ORDER — PREDNISONE 20 MG PO TABS: ORAL_TABLET | ORAL | 0 refills | Status: DC

## 2018-04-18 MED ORDER — LISINOPRIL 20 MG PO TABS: 20.0000 mg | ORAL_TABLET | Freq: Every day | ORAL | 1 refills | Status: DC

## 2018-04-18 MED ORDER — METOPROLOL TARTRATE 25 MG PO TABS: 25.0000 mg | ORAL_TABLET | Freq: Two times a day (BID) | ORAL | 1 refills | Status: DC

## 2018-04-18 MED ORDER — ALLOPURINOL 100 MG PO TABS: 100.0000 mg | ORAL_TABLET | Freq: Every day | ORAL | 1 refills | Status: DC

## 2018-04-18 NOTE — Progress Notes (Signed)
Roger Rumps, MD Phone: [phone]  Roger [name] is a 62 y.o. male who presents today for f/u.  CC: htn, gout, dm  HYPERTENSION  Disease Monitoring  Home BP Monitoring not checking Chest pain- no    Dyspnea- no Medications  Compliance-  Ran out of meds 1 month ago, was taking metoprolol, lisinopril, imdur prior to that.   Edema- yes, chronic left leg  He does note some vertiginous symptoms that have occurred on 2 occasions when he is just sitting there.  Lasts for 30 seconds and go away on their own.  No other symptoms with this.  DIABETES Disease Monitoring: Blood Sugar ranges-not checking Polyuria/phagia/dipsia- no      Medications: Compliance- taking metformin Hypoglycemic symptoms- no  Gout: Has intermittently been taking allopurinol.  Does not take his colchicine as his insurance has stopped paying for this.  He has had a month or so of discomfort in his left first MTP joint.  It has improved a little bit though he does continue to have some discomfort.  He did receive prednisone through the ED previously.  His left leg has chronically been swollen on an intermittent basis for the past 7 to 8 years.  He has fairly significant varicose veins that are unchanged.  He had an ultrasound of his left leg in 2017 and that did not reveal blood clot.  His leg has not changed since then.  He has never seen vascular surgery.  He notes a mole within his beard on the left side underneath his jawline that has been present for some time now.  He wonders about getting dermatology to look at this.  Patient reports getting tired at times when he is being active more so than typical.  This typically occurs if he is walking and carrying something heavy.  No significant issues when he is just walking around.  He will rest and then feel better.  This has been going on over the last month.  No diaphoresis with this.  No chest pain or shortness of breath with this.  He had cardiology evaluation  previously though has not followed up with them.  He thinks this issue is related to his left foot bothering him from his gout.  He did have an episode of rectal bleeding previously.  He has had no bleeding since then.  He never saw GI.   Social History   Tobacco Use  Smoking Status Never Smoker  Smokeless Tobacco Never Used     ROS see history of present illness  Objective  Physical Exam Vitals:   04/18/18 1405  BP: (!) 152/92  Pulse: 99  Temp: 98.4 F (36.9 C)  SpO2: 98%   Laying blood pressure 150/98 pulse 90 Sitting blood pressure 170/112 pulse 93 Standing blood pressure 160/108 pulse 100  BP Readings from Last 3 Encounters:  04/18/18 (!) 152/92  02/17/18 (!) 188/113  11/13/17 (!) 171/92   Wt Readings from Last 3 Encounters:  04/18/18 254 lb (115.2 kg)  02/17/18 260 lb (117.9 kg)  11/13/17 250 lb (113.4 kg)    Physical Exam  Constitutional: No distress.  Neck:    Cardiovascular: Normal rate, regular rhythm and normal heart sounds.  Pulmonary/Chest: Effort normal and breath sounds normal.  Musculoskeletal:  Mild swelling in left lower extremity mostly from the mid shin down, varicose veins noted around his knee and thigh that are nontender, no swelling in the right lower extremity, left first MTP joint with tenderness with just light palpation,  no significant warmth, no erythema  Neurological: He is alert.  CN 2-12 intact, 5/5 strength in bilateral biceps, triceps, grip, quads, hamstrings, plantar and dorsiflexion, sensation to light touch intact in bilateral UE and LE, normal gait, normal finger-to-nose, normal rapid alternating movements  Skin: Skin is warm and dry. He is not diaphoretic.   Diabetic Foot Exam - Simple   Simple Foot Form Diabetic Foot exam was performed with the following findings:  Yes 04/18/2018  3:03 PM  Visual Inspection No deformities, no ulcerations, no other skin breakdown bilaterally:  Yes Sensation Testing See comments:   Yes Pulse Check Posterior Tibialis and Dorsalis pulse intact bilaterally:  Yes Comments Slightly decreased monofilament testing bilaterally    EKG: Normal sinus rhythm, right axis, rate 93, no acute ischemic changes  Assessment/Plan: Please see individual problem list.  Essential hypertension Patient has been off of his medications over the last month.  We will refill these.  We will check lab work.  He will return in 2 weeks for repeat blood pressure check.  Blood in stool He notes no recurrence of this since being in the emergency department.  He needs to see GI.  Referral placed.  Diabetes ([place]) A1c is very well controlled.  He will continue his metformin.  Varicose veins of left leg with edema Chronic issues with varicose veins and swelling in his left leg.  This is unchanged for many years.  Ultrasound evaluation previously with no DVT.  We will refer to vascular surgery for treatment and evaluation.  Neuropathy ([place]) He does have some mild neuropathy with decreased monofilament testing.  Discussed monitoring his feet closely.  Gout Suspect gout flare.  We will treat with a prednisone taper.  Refill allopurinol.  Check uric acid.  Fatigue due to excessive exertion Patient with some exertional fatigue over the last month or so.  This is likely related to deconditioning and his foot pain could be contributing as well.  He does have cardiac disease and that potentially could be contributing.  We will check lab work to evaluate for other causes.  He will likely need to follow-up with cardiology as well.   Vertigo Symptoms seem to be consistent with vertigo.  He is not orthostatic.  They are not persistent vertigo symptoms.  Neurologically intact.  Discussed monitoring and if recurrent we could consider further evaluation.  Advised if they are persistent he needs to be evaluated.  Nevus Refer to dermatology.   Health Maintenance: Refer to GI for colonoscopy.  Orders Placed  This Encounter  Procedures  . Comp Met (CMET)  . CBC  . TSH  . Uric acid  . Ambulatory referral to Vascular Surgery    Referral Priority:   Routine    Referral Type:   Surgical    Referral Reason:   Specialty Services Required    Requested Specialty:   Vascular Surgery    Number of Visits Requested:   1  . Ambulatory referral to Dermatology    Referral Priority:   Routine    Referral Type:   Consultation    Referral Reason:   Specialty Services Required    Requested Specialty:   Dermatology    Number of Visits Requested:   1  . Ambulatory referral to Gastroenterology    Referral Priority:   Routine    Referral Type:   Consultation    Referral Reason:   Specialty Services Required    Number of Visits Requested:   1  . POCT HgB  A1C  . EKG 12-Lead    Meds ordered this encounter  Medications  . allopurinol (ZYLOPRIM) 100 MG tablet    Sig: Take 1 tablet (100 mg total) by mouth daily.    Dispense:  90 tablet    Refill:  1  . lisinopril (PRINIVIL,ZESTRIL) 20 MG tablet    Sig: Take 1 tablet (20 mg total) by mouth daily.    Dispense:  90 tablet    Refill:  1  . isosorbide mononitrate (IMDUR) 30 MG 24 hr tablet    Sig: Take 1 tablet (30 mg total) by mouth daily.    Dispense:  90 tablet    Refill:  1  . metoprolol tartrate (LOPRESSOR) 25 MG tablet    Sig: Take 1 tablet (25 mg total) by mouth 2 (two) times daily.    Dispense:  180 tablet    Refill:  1  . predniSONE (DELTASONE) 20 MG tablet    Sig: Take 40 mg (2 tablets) by mouth daily for 3 days, then take 20 mg (1 tablet) by mouth daily for 3 days, then take 10 mg (half a tablet) by mouth daily for 3 days    Dispense:  11 tablet    Refill:  0    Roger Rumps, MD [place]

## 2018-04-18 NOTE — Assessment & Plan Note (Signed)
Chronic issues with varicose veins and swelling in his left leg.  This is unchanged for many years.  Ultrasound evaluation previously with no DVT.  We will refer to vascular surgery for treatment and evaluation.

## 2018-04-18 NOTE — Assessment & Plan Note (Signed)
A1c is very well controlled.  He will continue his metformin.

## 2018-04-18 NOTE — Assessment & Plan Note (Signed)
He does have some mild neuropathy with decreased monofilament testing.  Discussed monitoring his feet closely.

## 2018-04-18 NOTE — Patient Instructions (Addendum)
Nice to see you. We will check lab work today and contact you with the results. We will have you resume your prior blood pressure medications and allopurinol. We will treat your gout flare with prednisone.  A taper has been sent to your pharmacy. We will get you to see GI for colonoscopy. We will have you see dermatology. We have referred you to vascular surgery for your varicose veins in your left leg as well. If you develop chest pain, shortness of breath, worsening pain, or any new or changing symptoms please seek medical attention.

## 2018-04-18 NOTE — Assessment & Plan Note (Signed)
He notes no recurrence of this since being in the emergency department.  He needs to see GI.  Referral placed.

## 2018-04-18 NOTE — Assessment & Plan Note (Signed)
Patient has been off of his medications over the last month.  We will refill these.  We will check lab work.  He will return in 2 weeks for repeat blood pressure check.

## 2018-04-19 DIAGNOSIS — R531 Weakness: Secondary | ICD-10-CM | POA: Insufficient documentation

## 2018-04-19 DIAGNOSIS — R42 Dizziness and giddiness: Secondary | ICD-10-CM | POA: Insufficient documentation

## 2018-04-19 DIAGNOSIS — D229 Melanocytic nevi, unspecified: Secondary | ICD-10-CM | POA: Insufficient documentation

## 2018-04-19 DIAGNOSIS — T733XXA Exhaustion due to excessive exertion, initial encounter: Secondary | ICD-10-CM | POA: Insufficient documentation

## 2018-04-19 NOTE — Assessment & Plan Note (Signed)
Suspect gout flare.  We will treat with a prednisone taper.  Refill allopurinol.  Check uric acid.

## 2018-04-19 NOTE — Assessment & Plan Note (Addendum)
Refer to dermatology 

## 2018-04-19 NOTE — Assessment & Plan Note (Signed)
Patient with some exertional fatigue over the last month or so.  This is likely related to deconditioning and his foot pain could be contributing as well.  He does have cardiac disease and that potentially could be contributing.  We will check lab work to evaluate for other causes.  He will likely need to follow-up with cardiology as well.

## 2018-04-19 NOTE — Assessment & Plan Note (Signed)
Symptoms seem to be consistent with vertigo.  He is not orthostatic.  They are not persistent vertigo symptoms.  Neurologically intact.  Discussed monitoring and if recurrent we could consider further evaluation.  Advised if they are persistent he needs to be evaluated.

## 2018-05-07 ENCOUNTER — Ambulatory Visit: Payer: BLUE CROSS/BLUE SHIELD

## 2018-06-23 ENCOUNTER — Other Ambulatory Visit: Payer: Self-pay

## 2018-06-23 MED ORDER — ALLOPURINOL 100 MG PO TABS: 100.0000 mg | ORAL_TABLET | Freq: Every day | ORAL | 1 refills | Status: DC

## 2018-07-21 ENCOUNTER — Ambulatory Visit (INDEPENDENT_AMBULATORY_CARE_PROVIDER_SITE_OTHER): Payer: BLUE CROSS/BLUE SHIELD | Admitting: Family Medicine

## 2018-07-21 ENCOUNTER — Ambulatory Visit: Payer: BLUE CROSS/BLUE SHIELD | Admitting: Family Medicine

## 2018-07-21 ENCOUNTER — Encounter: Payer: Self-pay | Admitting: Family Medicine

## 2018-07-21 VITALS — BP 130/90 | HR 67 | Temp 98.3°F | Wt 258.0 lb

## 2018-07-21 DIAGNOSIS — M109 Gout, unspecified: Secondary | ICD-10-CM

## 2018-07-21 DIAGNOSIS — E1159 Type 2 diabetes mellitus with other circulatory complications: Secondary | ICD-10-CM

## 2018-07-21 DIAGNOSIS — D649 Anemia, unspecified: Secondary | ICD-10-CM | POA: Diagnosis not present

## 2018-07-21 DIAGNOSIS — I10 Essential (primary) hypertension: Secondary | ICD-10-CM

## 2018-07-21 DIAGNOSIS — Z8719 Personal history of other diseases of the digestive system: Secondary | ICD-10-CM

## 2018-07-21 DIAGNOSIS — K921 Melena: Secondary | ICD-10-CM

## 2018-07-21 DIAGNOSIS — D229 Melanocytic nevi, unspecified: Secondary | ICD-10-CM

## 2018-07-21 DIAGNOSIS — D638 Anemia in other chronic diseases classified elsewhere: Secondary | ICD-10-CM | POA: Insufficient documentation

## 2018-07-21 LAB — HEMOGLOBIN A1C: Hgb A1c MFr Bld: 7 % — ABNORMAL HIGH (ref 4.6–6.5)

## 2018-07-21 LAB — FOLATE: Folate: 16.9 ng/mL (ref >5.9)

## 2018-07-21 LAB — BASIC METABOLIC PANEL
BUN: 18 mg/dL (ref 6–23)
CHLORIDE: 102 meq/L (ref 96–112)
CO2: 27 mEq/L (ref 19–32)
Calcium: 9.8 mg/dL (ref 8.4–10.5)
Creatinine, Ser: 1.4 mg/dL (ref 0.40–1.50)
GFR: 66.44 mL/min (ref >60.00)
GLUCOSE: 136 mg/dL — AB (ref 70–99)
POTASSIUM: 4.1 meq/L (ref 3.5–5.1)
SODIUM: 138 meq/L (ref 135–145)

## 2018-07-21 LAB — CBC
HCT: 37.3 % — ABNORMAL LOW (ref 39.0–52.0)
Hemoglobin: 12.5 g/dL — ABNORMAL LOW (ref 13.0–17.0)
MCHC: 33.6 g/dL (ref 30.0–36.0)
MCV: 97.7 fl (ref 78.0–100.0)
PLATELETS: 278 10*3/uL (ref 150.0–400.0)
RBC: 3.82 Mil/uL — AB (ref 4.22–5.81)
RDW: 12.9 % (ref 11.5–15.5)
WBC: 6.4 10*3/uL (ref 4.0–10.5)

## 2018-07-21 LAB — VITAMIN B12: Vitamin B-12: 455 pg/mL (ref 211–911)

## 2018-07-21 LAB — URIC ACID: URIC ACID, SERUM: 8.1 mg/dL — AB (ref 4.0–7.8)

## 2018-07-21 MED ORDER — PREDNISONE 20 MG PO TABS: ORAL_TABLET | ORAL | 0 refills | Status: DC

## 2018-07-21 NOTE — Assessment & Plan Note (Signed)
Above goal though he is only been back on his medications for 1 week.  We will have him return for nurse blood pressure check in 2 weeks and then determine management from there.  Check lab work today.

## 2018-07-21 NOTE — Progress Notes (Signed)
[name], MD Phone: [phone]  Roger [name] is a 62 y.o. male who presents today for f/u.  CC: htn, DM, rectal bleeding, gout, nevus  HYPERTENSION  Disease Monitoring  Home BP Monitoring not checking Chest pain- no    Dyspnea- no Medications  Compliance-  Started back on his BP medications 1 week ago, taking lisinopril, imdur, metoprolol  Edema- no  DIABETES Disease Monitoring: Blood Sugar ranges-not checking Polyuria/phagia/dipsia- no      Optho- due, referral placed Medications: Compliance- taking metformin Hypoglycemic symptoms- no  Gout: Currently taking allopurinol.  His insurance will not pay for colchicine.  Notes he had a gout flare last week and he called to get in though there is no documentation from the [facility].  He states they advised him to come to his appointment that was scheduled for today.  He notes his symptoms are slightly improved.  The symptoms were consistent with his gout and occurred in his left knee and his right thumb.  Does still have some discomfort.  Rectal bleeding: Patient notes he has not had this in about a year.  He did not see GI to follow-up on this.  He did not end up going to see dermatology for the spot on his left cheek.  It is still there.   Social History   Tobacco Use  Smoking Status Never Smoker  Smokeless Tobacco Never Used     ROS see history of present illness  Objective  Physical Exam Vitals:   07/21/18 1005 07/21/18 1039  BP: (!) 158/98 130/90  Pulse: 67   Temp: 98.3 F (36.8 C)   SpO2: 97%     BP Readings from Last 3 Encounters:  07/21/18 130/90  04/18/18 (!) 152/92  02/17/18 (!) 188/113   Wt Readings from Last 3 Encounters:  07/21/18 258 lb (117 kg)  04/18/18 254 lb (115.2 kg)  02/17/18 260 lb (117.9 kg)    Physical Exam  Constitutional: No distress.  Cardiovascular: Normal rate, regular rhythm and normal heart sounds.  Pulmonary/Chest: Effort normal and breath sounds normal.    Musculoskeletal: He exhibits no edema.  Minimal swelling of his left knee with no warmth, erythema, or tenderness, minimal swelling of his right thumb with no tenderness, warmth, or erythema  Neurological: He is alert.  Skin: Skin is warm and dry. He is not diaphoretic.  Small nevus with scab noted near left jawline   Assessment/Plan: Please see individual problem list.  Essential hypertension Above goal though he is only been back on his medications for 1 week.  We will have him return for nurse blood pressure check in 2 weeks and then determine management from there.  Check lab work today.  Blood in stool Previously referred to GI though he did not see them.  We will re-refer particularly given that he has been mildly anemic.  Diabetes (HCC) Check A1c.  Continue metformin.  Nevus Refer back to dermatology to consider removal.  Gout Seems to continue to have discomfort from his recent flare.  We will treat him with prednisone and check a uric acid.  If uric acid remains elevated consider referral to rheumatology.   Orders Placed This Encounter  Procedures  . Uric acid  . CBC  . Basic Metabolic Panel (BMET)  . Iron, TIBC and Ferritin Panel  . B12  . Folate  . HgB A1c  . Ambulatory referral to Ophthalmology    Referral Priority:   Routine    Referral Type:   Consultation  Referral Reason:   Specialty Services Required    Requested Specialty:   Ophthalmology    Number of Visits Requested:   1  . Ambulatory referral to Gastroenterology    Referral Priority:   Routine    Referral Type:   Consultation    Referral Reason:   Specialty Services Required    Number of Visits Requested:   1  . Ambulatory referral to Dermatology    Referral Priority:   Routine    Referral Type:   Consultation    Referral Reason:   Specialty Services Required    Requested Specialty:   Dermatology    Number of Visits Requested:   1    Meds ordered this encounter  Medications  . predniSONE  (DELTASONE) 20 MG tablet    Sig: Take 40 mg (2 tablets) by mouth daily for 5 days, then decrease to 20 mg (1 tablet) by mouth daily for 5 days    Dispense:  15 tablet    Refill:  0     [name], MD [place]

## 2018-07-21 NOTE — Patient Instructions (Addendum)
Nice to see you. We will refer you to the eye doctor, dermatologist, and GI for evaluation.  We will get lab work today.  We will treat you with prednisone for your gout flare. If your gout worsens please let us know.

## 2018-07-21 NOTE — Assessment & Plan Note (Addendum)
Refer back to dermatology to consider removal.

## 2018-07-21 NOTE — Assessment & Plan Note (Addendum)
Seems to continue to have discomfort from his recent flare.  We will treat him with prednisone and check a uric acid.  If uric acid remains elevated consider referral to rheumatology.

## 2018-07-21 NOTE — Assessment & Plan Note (Signed)
Check A1c.  Continue metformin. 

## 2018-07-21 NOTE — Assessment & Plan Note (Signed)
Previously referred to GI though he did not see them.  We will re-refer particularly given that he has been mildly anemic.

## 2018-07-22 LAB — IRON,TIBC AND FERRITIN PANEL
%SAT: 25 % (ref 20–48)
FERRITIN: 483 ng/mL — AB (ref 24–380)
IRON: 77 ug/dL (ref 50–180)
TIBC: 313 ug/dL (ref 250–425)

## 2018-07-24 ENCOUNTER — Ambulatory Visit: Payer: BLUE CROSS/BLUE SHIELD | Admitting: Gastroenterology

## 2018-07-24 ENCOUNTER — Other Ambulatory Visit: Payer: Self-pay

## 2018-07-24 ENCOUNTER — Other Ambulatory Visit: Payer: Self-pay | Admitting: Family Medicine

## 2018-07-24 VITALS — BP 164/99

## 2018-07-24 DIAGNOSIS — K625 Hemorrhage of anus and rectum: Secondary | ICD-10-CM

## 2018-07-24 DIAGNOSIS — D649 Anemia, unspecified: Secondary | ICD-10-CM

## 2018-07-24 DIAGNOSIS — M109 Gout, unspecified: Secondary | ICD-10-CM

## 2018-07-24 NOTE — Progress Notes (Signed)
Cephas Darby, MD [address]  [place]  [place], [place] [address]  Main: [phone]  Fax: [phone]    Gastroenterology Consultation  Referring Provider:     Leone Haven, MD Primary Care Physician:  Leone Haven, MD Primary Gastroenterologist:  Dr. Cephas Darby Reason for Consultation:     Rectal bleeding        HPI:   Roger [name] is a 62 y.o. male referred by Dr. Caryl Bis, Angela Adam, MD  for consultation & management of rectal bleeding. Patient has history of metabolic syndrome, noticed blood per rectum, mixed with stool about 8 months ago, on 2 separate occasions.He said he was taking ibuprofen along with indomethacin for acute gout flare up at that time. He reports that his bowel frequency is about 3 times a week and denies straining, hard stools. He did not have any more episodes of bleeding per rectum since then. He denies any other GI symptoms. He never had a colonoscopy He also has chronic normocytic anemia, normal ferritin, B12 and folate levels NSAIDs: none  Antiplts/Anticoagulants/Anti thrombotics: none  GI Procedures: none He denies family history of GI malignancy He denies any GI surgeries  Past Medical History:  Diagnosis Date  . Chest pain    a. 04/2016 MV: small, mild apical defect c/w apical ischemia, EF 55-65%-->Low risk study.  . Diabetes mellitus without complication ([place])   . Hematochezia   . History of echocardiogram    a. 04/2016 Echo: EF 55-60%, mild LVH, no rwma, midly dil LA.  Marland Kitchen Hypertension   . Morbid obesity ([place])     Past Surgical History:  Procedure Laterality Date  . thumb surgery      Current Outpatient Medications:  .  allopurinol (ZYLOPRIM) 100 MG tablet, Take 1 tablet (100 mg total) by mouth daily., Disp: 90 tablet, Rfl: 1 .  lisinopril (PRINIVIL,ZESTRIL) 20 MG tablet, Take 1 tablet (20 mg total) by mouth daily., Disp: 90 tablet, Rfl: 1 .  metFORMIN (GLUCOPHAGE) 500 MG tablet, Take 1 tablet (500 mg  total) by mouth 2 (two) times daily with a meal. (Patient taking differently: Take 500 mg by mouth daily with breakfast. ), Disp: 180 tablet, Rfl: 1 .  metoprolol tartrate (LOPRESSOR) 25 MG tablet, Take 1 tablet (25 mg total) by mouth 2 (two) times daily., Disp: 180 tablet, Rfl: 1 .  predniSONE (DELTASONE) 20 MG tablet, Take 40 mg (2 tablets) by mouth daily for 5 days, then decrease to 20 mg (1 tablet) by mouth daily for 5 days, Disp: 15 tablet, Rfl: 0 .  colchicine (COLCRYS) 0.6 MG tablet, TAKE 1 TABLET(0.6 MG) BY MOUTH DAILY (Patient not taking: Reported on 07/21/2018), Disp: 30 tablet, Rfl: 0 .  ibuprofen (ADVIL,MOTRIN) 800 MG tablet, Take 1 tablet (800 mg total) by mouth every 8 (eight) hours as needed. (Patient not taking: Reported on 07/24/2018), Disp: 30 tablet, Rfl: 0 .  isosorbide mononitrate (IMDUR) 30 MG 24 hr tablet, Take 1 tablet (30 mg total) by mouth daily., Disp: 90 tablet, Rfl: 1    Family History  Problem Relation Age of Onset  . Heart Problems Mother   . Heart disease Unknown   . Hypertension Unknown   . Diabetes Unknown      Social History   Tobacco Use  . Smoking status: Never Smoker  . Smokeless tobacco: Never Used  Substance Use Topics  . Alcohol use: Yes    Alcohol/week: 6.0 standard drinks    Types: 2  Cans of beer, 4 Shots of liquor per week    Comment: everyday.  . Drug use: No    Allergies as of 07/24/2018 - Review Complete 07/24/2018  Allergen Reaction Noted  . Albuterol Nausea And Vomiting 01/02/2017  . Mucinex [guaifenesin er] Rash 01/02/2017    Review of Systems:    All systems reviewed and negative except where noted in HPI.   Physical Exam:  BP (!) 164/99 (BP Location: Left Arm, Patient Position: Sitting, Cuff Size: Large)  No LMP for male patient.  General:   Alert,  Well-developed, well-nourished, pleasant and cooperative in NAD Head:  Normocephalic and atraumatic. Eyes:  Sclera clear, no icterus.   Conjunctiva pink. Ears:  Normal  auditory acuity. Nose:  No deformity, discharge, or lesions. Mouth:  No deformity or lesions,oropharynx pink & moist. Neck:  Supple; no masses or thyromegaly. Lungs:  Respirations even and unlabored.  Clear throughout to auscultation.   No wheezes, crackles, or rhonchi. No acute distress. Heart:  Regular rate and rhythm; no murmurs, clicks, rubs, or gallops. Abdomen:  Normal bowel sounds. Soft, morbidly obese, non-tender and non-distended without masses, hepatosplenomegaly or hernias noted.  No guarding or rebound tenderness.   Rectal: Not performed Msk:  Symmetrical without gross deformities. Good, equal movement & strength bilaterally. Pulses:  Normal pulses noted. Extremities:  No clubbing or edema.  No cyanosis. Neurologic:  Alert and oriented x3;  grossly normal neurologically. Skin:  Intact without significant lesions or rashes. No jaundice. Lymph Nodes:  No significant cervical adenopathy. Psych:  Alert and cooperative. Normal mood and affect.  Imaging Studies: No abdominal imaging  Assessment and Plan:   Roger [name] is a 62 y.o. African-American male with metabolic syndrome, episode of rectal bleeding about 8 months ago, no previous colonoscopy, normocytic anemia  Recommend colonoscopy 2 day prep  I have discussed alternative options, risks & benefits,  which include, but are not limited to, bleeding, infection, perforation,respiratory complication & drug reaction.  The patient agrees with this plan & written consent will be obtained.     Follow up based on the colonoscopy results   Cephas Darby, MD

## 2018-08-01 ENCOUNTER — Inpatient Hospital Stay: Payer: BLUE CROSS/BLUE SHIELD

## 2018-08-01 ENCOUNTER — Other Ambulatory Visit: Payer: Self-pay

## 2018-08-01 ENCOUNTER — Encounter: Payer: Self-pay | Admitting: Oncology

## 2018-08-01 ENCOUNTER — Inpatient Hospital Stay: Payer: BLUE CROSS/BLUE SHIELD | Admitting: Oncology

## 2018-08-01 ENCOUNTER — Inpatient Hospital Stay: Payer: BLUE CROSS/BLUE SHIELD | Attending: Oncology | Admitting: Oncology

## 2018-08-01 VITALS — BP 156/95 | HR 65 | Temp 96.7°F | Resp 16 | Wt 259.6 lb

## 2018-08-01 DIAGNOSIS — M109 Gout, unspecified: Secondary | ICD-10-CM | POA: Diagnosis not present

## 2018-08-01 DIAGNOSIS — F1020 Alcohol dependence, uncomplicated: Secondary | ICD-10-CM | POA: Diagnosis not present

## 2018-08-01 DIAGNOSIS — Z7289 Other problems related to lifestyle: Secondary | ICD-10-CM

## 2018-08-01 DIAGNOSIS — D649 Anemia, unspecified: Secondary | ICD-10-CM

## 2018-08-01 DIAGNOSIS — Z789 Other specified health status: Secondary | ICD-10-CM

## 2018-08-01 LAB — HEPATIC FUNCTION PANEL
ALBUMIN: 3.7 g/dL (ref 3.5–5.0)
ALK PHOS: 85 U/L (ref 38–126)
ALT: 25 U/L (ref 0–44)
AST: 28 U/L (ref 15–41)
Bilirubin, Direct: <0.1 mg/dL (ref 0.0–0.2)
TOTAL PROTEIN: 6.9 g/dL (ref 6.5–8.1)
Total Bilirubin: 0.7 mg/dL (ref 0.3–1.2)

## 2018-08-01 LAB — CBC WITH DIFFERENTIAL/PLATELET
Basophils Absolute: 0.1 10*3/uL (ref 0–0.1)
Basophils Relative: 1 %
Eosinophils Absolute: 0.2 10*3/uL (ref 0–0.7)
Eosinophils Relative: 2 %
HEMATOCRIT: 36.3 % — AB (ref 40.0–52.0)
Hemoglobin: 12.3 g/dL — ABNORMAL LOW (ref 13.0–18.0)
LYMPHS PCT: 27 %
Lymphs Abs: 2 10*3/uL (ref 1.0–3.6)
MCH: 32.9 pg (ref 26.0–34.0)
MCHC: 33.9 g/dL (ref 32.0–36.0)
MCV: 97.1 fL (ref 80.0–100.0)
MONO ABS: 0.5 10*3/uL (ref 0.2–1.0)
MONOS PCT: 7 %
NEUTROS ABS: 4.6 10*3/uL (ref 1.4–6.5)
Neutrophils Relative %: 63 %
Platelets: 213 10*3/uL (ref 150–440)
RBC: 3.74 MIL/uL — ABNORMAL LOW (ref 4.40–5.90)
RDW: 12.9 % (ref 11.5–14.5)
WBC: 7.3 10*3/uL (ref 3.8–10.6)

## 2018-08-01 LAB — URIC ACID: Uric Acid, Serum: 8.6 mg/dL (ref 3.7–8.6)

## 2018-08-01 LAB — TECHNOLOGIST SMEAR REVIEW: Tech Review: ADEQUATE

## 2018-08-01 LAB — LACTATE DEHYDROGENASE: LDH: 90 U/L — ABNORMAL LOW (ref 98–192)

## 2018-08-01 NOTE — Progress Notes (Signed)
Hematology/Oncology Consult note [facility] Telephone:([phone] [phone]   Patient Care Team: Leone Haven, MD as PCP - General (Family Medicine)  REFERRING PROVIDER: Leone Haven, MD CHIEF COMPLAINTS/REASON FOR VISIT:  Evaluation of anemia  HISTORY OF PRESENTING ILLNESS:  Roger [name] is a  62 y.o.  male with PMH listed below who was referred to me for evaluation of anemia Reviewed patient's recent labs that was done at [facility] office. Labs revealed anemia with hemoglobin of 12.5 Reviewed patient's previous labs ordered by primary care physician's office, anemia is chronic onset , duration is since at least 2014. No aggravating or improving factors.  Associated signs and symptoms: Patient reports feeling fatigue.  Denies shortness of breath. Denies weight loss, easy bruising, hematochezia, hemoptysis, hematuria. Context: History of GI bleeding: yes.  Has establish care with Dr. Marius Ditch gastroenterology.  Plan colonoscopy.               History of Chronic kidney disease denies               History of autoimmune disease: Denies he was referred by PCP to rheumatology for gout evaluation.               History of hemolytic anemia.                Last colonoscopy: Not available             # Alcohol use: Patient drinks alcohol every day, usually 15-20 beers a week with the liquor # Gout, reports that her left lower extremity is chronically swollen intermittently, he had significant varicose veins.  Also had frequent gout flare with left first MTP pain. . No aggravating or alleviating factors.  Not associated with fever or chills. Review of Systems  Constitutional: Positive for malaise/fatigue. Negative for chills, fever and weight loss.  HENT: Negative for nosebleeds and sore throat.   Eyes: Negative for double vision, photophobia and redness.  Respiratory: Negative for cough, shortness of breath and wheezing.   Cardiovascular: Negative  for chest pain, palpitations and orthopnea.  Gastrointestinal: Positive for blood in stool. Negative for abdominal pain, nausea and vomiting.  Genitourinary: Negative for dysuria.  Musculoskeletal: Positive for joint pain. Negative for back pain, myalgias and neck pain.  Skin: Negative for itching and rash.  Neurological: Negative for dizziness, tingling and tremors.  Endo/Heme/Allergies: Negative for environmental allergies. Does not bruise/bleed easily.  Psychiatric/Behavioral: Negative for depression.    MEDICAL HISTORY:  Past Medical History:  Diagnosis Date  . Chest pain    a. 04/2016 MV: small, mild apical defect c/w apical ischemia, EF 55-65%-->Low risk study.  . Diabetes mellitus without complication ([place])   . Gout   . Hematochezia   . History of echocardiogram    a. 04/2016 Echo: EF 55-60%, mild LVH, no rwma, midly dil LA.  Marland Kitchen Hypertension   . Morbid obesity ([place])     SURGICAL HISTORY: Past Surgical History:  Procedure Laterality Date  . thumb surgery      SOCIAL HISTORY: Social History   Socioeconomic History  . Marital status: Divorced    Spouse name: Not on file  . Number of children: 5  . Years of education: Not on file  . Highest education level: Not on file  Occupational History  . Not on file  Social Needs  . Financial resource strain: Not on file  . Food insecurity:    Worry: Not on file  Inability: Not on file  . Transportation needs:    Medical: Not on file    Non-medical: Not on file  Tobacco Use  . Smoking status: Former Smoker    Packs/day: 0.25    Years: 25.00    Pack years: 6.25    Types: Cigarettes    Last attempt to quit: 1994    Years since quitting: 25.6  . Smokeless tobacco: Never Used  Substance and Sexual Activity  . Alcohol use: Yes    Alcohol/week: 48.0 standard drinks    Types: 20 Cans of beer, 28 Shots of liquor per week    Comment: everyday.   . Drug use: No  . Sexual activity: Not on file  Lifestyle  . Physical  activity:    Days per week: Not on file    Minutes per session: Not on file  . Stress: Not on file  Relationships  . Social connections:    Talks on phone: Not on file    Gets together: Not on file    Attends religious service: Not on file    Active member of club or organization: Not on file    Attends meetings of clubs or organizations: Not on file    Relationship status: Not on file  . Intimate partner violence:    Fear of current or ex partner: Not on file    Emotionally abused: Not on file    Physically abused: Not on file    Forced sexual activity: Not on file  Other Topics Concern  . Not on file  Social History Narrative  . Not on file    FAMILY HISTORY: Family History  Problem Relation Age of Onset  . Heart Problems Mother   . Breast cancer Mother   . Mental illness Father   . Heart disease Unknown   . Hypertension Unknown   . Diabetes Unknown     ALLERGIES:  is allergic to albuterol and mucinex [guaifenesin er].  MEDICATIONS:  Current Outpatient Medications  Medication Sig Dispense Refill  . allopurinol (ZYLOPRIM) 100 MG tablet Take 1 tablet (100 mg total) by mouth daily. 90 tablet 1  . ibuprofen (ADVIL,MOTRIN) 800 MG tablet Take 1 tablet (800 mg total) by mouth every 8 (eight) hours as needed. 30 tablet 0  . lisinopril (PRINIVIL,ZESTRIL) 20 MG tablet Take 1 tablet (20 mg total) by mouth daily. 90 tablet 1  . metFORMIN (GLUCOPHAGE) 500 MG tablet Take 1 tablet (500 mg total) by mouth 2 (two) times daily with a meal. (Patient taking differently: Take 500 mg by mouth daily with breakfast. ) 180 tablet 1  . metoprolol tartrate (LOPRESSOR) 25 MG tablet Take 1 tablet (25 mg total) by mouth 2 (two) times daily. 180 tablet 1  . predniSONE (DELTASONE) 20 MG tablet Take 40 mg (2 tablets) by mouth daily for 5 days, then decrease to 20 mg (1 tablet) by mouth daily for 5 days 15 tablet 0  . isosorbide mononitrate (IMDUR) 30 MG 24 hr tablet Take 1 tablet (30 mg total) by mouth  daily. 90 tablet 1   No current facility-administered medications for this visit.      PHYSICAL EXAMINATION: ECOG PERFORMANCE STATUS: 1 - Symptomatic but completely ambulatory Vitals:   08/01/18 1005  BP: (!) 156/95  Pulse: 65  Resp: 16  Temp: (!) 96.7 F (35.9 C)   Filed Weights   08/01/18 1005  Weight: 259 lb 9 oz (117.7 kg)    Physical Exam  Constitutional: He is oriented  to person, place, and time. No distress.  Obese  HENT:  Head: Normocephalic and atraumatic.  Right Ear: External ear normal.  Left Ear: External ear normal.  Mouth/Throat: Oropharynx is clear and moist.  Eyes: Pupils are equal, round, and reactive to light. EOM are normal. No scleral icterus.  Neck: Normal range of motion. Neck supple.  Cardiovascular: Normal rate, regular rhythm and normal heart sounds.  Pulmonary/Chest: Effort normal. No respiratory distress. He has no wheezes.  Abdominal: Soft. Bowel sounds are normal. He exhibits no distension and no mass. There is no tenderness.  Musculoskeletal: Normal range of motion. He exhibits no edema or deformity.  Left lower extremity with varicose vein   Neurological: He is alert and oriented to person, place, and time. No cranial nerve deficit. Coordination normal.  Skin: Skin is warm and dry. No rash noted. No erythema.  Psychiatric: He has a normal mood and affect. His behavior is normal. Thought content normal.     LABORATORY DATA:  I have reviewed the data as listed Lab Results  Component Value Date   WBC 7.3 08/01/2018   HGB 12.3 (L) 08/01/2018   HCT 36.3 (L) 08/01/2018   MCV 97.1 08/01/2018   PLT 213 08/01/2018   Recent Labs    08/07/17 1038 11/13/17 2016 04/18/18 1456 07/21/18 1034 08/01/18 1102  NA 136 134* 141 138  --   K 4.1 3.4* 3.8 4.1  --   CL 100* 103 103 102  --   CO2 _0 --   GLUCOSE 130* 117* 120* 136*  --   BUN _1 --   CREATININE 1.17 1.49* 1.27 1.40  --   CALCIUM 9.7 9.0 10.0 9.8  --   GFRNONAA  >60 50*  --   --   --   GFRAA >60 58*  --   --   --   PROT  --  7.6 7.4  --  6.9  ALBUMIN  --  4.0 4.0  --  3.7  AST  --  40 21  --  28  ALT  --  31 20  --  25  ALKPHOS  --  98 96  --  85  BILITOT  --  0.5 0.5  --  0.7  BILIDIR  --   --   --   --  <0.1  IBILI  --   --   --   --  NOT CALCULATED   Iron/TIBC/Ferritin/ %Sat    Component Value Date/Time   IRON 77 07/21/2018 1034   TIBC 313 07/21/2018 1034   FERRITIN 483 (H) 07/21/2018 1034   IRONPCTSAT 25 07/21/2018 1034    RADIOGRAPHIC STUDIES: I have personally reviewed the radiological images as listed and agreed with the findings in the report. 02/17/2018 left foot x-ray showed no evidence of fracture or dislocation.  Moderate narrowing of the first MTP joint.  Soft tissue are unremarkable.  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  No acute abnormality seen in the left foot.  ASSESSMENT & PLAN:  1. Anemia, unspecified type   2. Gout, unspecified cause, unspecified chronicity, unspecified site   3. Alcohol use    #Anemia, mild.  Labs reviewed. Iron panel consistent with anemia of chronic disease.  Normal folate level.  Normal vitamin B12 Anemia: multifactorial with possible causes including chronic blood loss, hyper/hypothyroidism, nutritional deficiency, infection/chronic inflammation, hemolysis, underlying bone marrow disorders. Check blood smear, monoclonal gammopathy evaluation, liver function, LDH.  Uric acid. #Gout, patient takes allopurinol.  Check uric acid # Alcohol use: Also discussed with patient that chronic alcohol use can suppress bone marrow function.  Alcohol cessation discussed with patient.  Also recommend patient to take oral G68 and folic acid supplements.  Orders Placed This Encounter  Procedures  . CBC with Differential/Platelet    Standing Status:   Future    Number of Occurrences:   1    Standing Expiration Date:   08/02/2019  . Technologist smear review    Standing Status:   Future     Number of Occurrences:   1    Standing Expiration Date:   08/02/2019  . Multiple Myeloma Panel (SPEP&IFE w/QIG)    Standing Status:   Future    Number of Occurrences:   1    Standing Expiration Date:   08/01/2019  . Kappa/lambda light chains    Standing Status:   Future    Number of Occurrences:   1    Standing Expiration Date:   08/01/2019  . Hepatic function panel    Standing Status:   Future    Number of Occurrences:   1    Standing Expiration Date:   08/01/2019  . Lactate dehydrogenase    Standing Status:   Future    Number of Occurrences:   1    Standing Expiration Date:   08/02/2019  . Uric acid    Standing Status:   Future    Number of Occurrences:   1    Standing Expiration Date:   08/02/2019    All questions were answered. The patient knows to call the clinic with any problems questions or concerns.  Return of visit: 2 weeks Thank you for this kind referral and the opportunity to participate in the care of this patient. A copy of today's note is routed to referring provider      Earlie Server, MD, PhD Hematology Oncology [facility] Pager- [phone] 08/01/2018

## 2018-08-01 NOTE — Progress Notes (Signed)
Patient here today as a new patient for anemia  

## 2018-08-04 LAB — MULTIPLE MYELOMA PANEL, SERUM
ALBUMIN SERPL ELPH-MCNC: 3.4 g/dL (ref 2.9–4.4)
ALPHA2 GLOB SERPL ELPH-MCNC: 0.7 g/dL (ref 0.4–1.0)
Albumin/Glob SerPl: 1.2 (ref 0.7–1.7)
Alpha 1: 0.2 g/dL (ref 0.0–0.4)
B-Globulin SerPl Elph-Mcnc: 1 g/dL (ref 0.7–1.3)
Gamma Glob SerPl Elph-Mcnc: 1 g/dL (ref 0.4–1.8)
Globulin, Total: 2.9 g/dL (ref 2.2–3.9)
IGG (IMMUNOGLOBIN G), SERUM: 1106 mg/dL (ref 700–1600)
IGM (IMMUNOGLOBULIN M), SRM: 75 mg/dL (ref 20–172)
IgA: 197 mg/dL (ref 90–386)
TOTAL PROTEIN ELP: 6.3 g/dL (ref 6.0–8.5)

## 2018-08-04 LAB — KAPPA/LAMBDA LIGHT CHAINS
Kappa free light chain: 41.3 mg/L — ABNORMAL HIGH (ref 3.3–19.4)
Kappa, lambda light chain ratio: 1.63 (ref 0.26–1.65)
LAMDA FREE LIGHT CHAINS: 25.3 mg/L (ref 5.7–26.3)

## 2018-08-05 ENCOUNTER — Encounter: Payer: Self-pay | Admitting: *Deleted

## 2018-08-05 ENCOUNTER — Ambulatory Visit: Payer: BLUE CROSS/BLUE SHIELD | Admitting: Certified Registered"

## 2018-08-05 ENCOUNTER — Encounter
Admission: RE | Disposition: A | Discharge status: Home / Self Care | Payer: Self-pay | Source: Ambulatory Visit | Attending: Gastroenterology

## 2018-08-05 ENCOUNTER — Ambulatory Visit
Admission: RE | Admit: 2018-08-05 | Discharge: 2018-08-05 | Disposition: A | Discharge status: Home / Self Care | Payer: BLUE CROSS/BLUE SHIELD | Source: Ambulatory Visit | Attending: Gastroenterology | Admitting: Gastroenterology

## 2018-08-05 DIAGNOSIS — E1151 Type 2 diabetes mellitus with diabetic peripheral angiopathy without gangrene: Secondary | ICD-10-CM | POA: Insufficient documentation

## 2018-08-05 DIAGNOSIS — D120 Benign neoplasm of cecum: Secondary | ICD-10-CM

## 2018-08-05 DIAGNOSIS — Z6837 Body mass index (BMI) 37.0-37.9, adult: Secondary | ICD-10-CM | POA: Insufficient documentation

## 2018-08-05 DIAGNOSIS — Z79899 Other long term (current) drug therapy: Secondary | ICD-10-CM | POA: Insufficient documentation

## 2018-08-05 DIAGNOSIS — D128 Benign neoplasm of rectum: Secondary | ICD-10-CM | POA: Insufficient documentation

## 2018-08-05 DIAGNOSIS — Z791 Long term (current) use of non-steroidal anti-inflammatories (NSAID): Secondary | ICD-10-CM | POA: Insufficient documentation

## 2018-08-05 DIAGNOSIS — I10 Essential (primary) hypertension: Secondary | ICD-10-CM | POA: Insufficient documentation

## 2018-08-05 DIAGNOSIS — M109 Gout, unspecified: Secondary | ICD-10-CM | POA: Insufficient documentation

## 2018-08-05 DIAGNOSIS — Z7984 Long term (current) use of oral hypoglycemic drugs: Secondary | ICD-10-CM | POA: Insufficient documentation

## 2018-08-05 DIAGNOSIS — D123 Benign neoplasm of transverse colon: Secondary | ICD-10-CM | POA: Insufficient documentation

## 2018-08-05 DIAGNOSIS — K635 Polyp of colon: Secondary | ICD-10-CM | POA: Insufficient documentation

## 2018-08-05 DIAGNOSIS — K625 Hemorrhage of anus and rectum: Secondary | ICD-10-CM | POA: Diagnosis not present

## 2018-08-05 DIAGNOSIS — K621 Rectal polyp: Secondary | ICD-10-CM | POA: Diagnosis not present

## 2018-08-05 DIAGNOSIS — K5730 Diverticulosis of large intestine without perforation or abscess without bleeding: Secondary | ICD-10-CM | POA: Diagnosis not present

## 2018-08-05 DIAGNOSIS — Z87891 Personal history of nicotine dependence: Secondary | ICD-10-CM | POA: Insufficient documentation

## 2018-08-05 DIAGNOSIS — K648 Other hemorrhoids: Secondary | ICD-10-CM | POA: Diagnosis not present

## 2018-08-05 HISTORY — PX: COLONOSCOPY WITH PROPOFOL: SHX5780

## 2018-08-05 LAB — GLUCOSE, CAPILLARY: Glucose-Capillary: 105 mg/dL — ABNORMAL HIGH (ref 70–99)

## 2018-08-05 SURGERY — COLONOSCOPY WITH PROPOFOL
Anesthesia: General

## 2018-08-05 MED ORDER — LIDOCAINE HCL (PF) 2 % IJ SOLN
INTRAMUSCULAR | Status: AC
  Filled 2018-08-05: qty 10

## 2018-08-05 MED ORDER — PROPOFOL 10 MG/ML IV BOLUS
INTRAVENOUS | Status: DC | PRN
  Administered 2018-08-05: 100 mg via INTRAVENOUS

## 2018-08-05 MED ORDER — SODIUM CHLORIDE 0.9 % IV SOLN
INTRAVENOUS | Status: DC
  Administered 2018-08-05: 1000 mL via INTRAVENOUS

## 2018-08-05 MED ORDER — PROPOFOL 500 MG/50ML IV EMUL
INTRAVENOUS | Status: DC | PRN
  Administered 2018-08-05: 110 ug/kg/min via INTRAVENOUS

## 2018-08-05 MED ORDER — PROPOFOL 500 MG/50ML IV EMUL
INTRAVENOUS | Status: AC
  Filled 2018-08-05: qty 50

## 2018-08-05 MED ORDER — LIDOCAINE HCL (CARDIAC) PF 100 MG/5ML IV SOSY
PREFILLED_SYRINGE | INTRAVENOUS | Status: DC | PRN
  Administered 2018-08-05: 50 mg via INTRAVENOUS

## 2018-08-05 NOTE — Anesthesia Preprocedure Evaluation (Addendum)
Anesthesia Evaluation  Patient identified by MRN, date of birth, ID band Patient awake    Reviewed: Allergy & Precautions, NPO status , Patient's Chart, lab work & pertinent test results, reviewed documented beta blocker date and time   Airway Mallampati: II       Dental  (+) Upper Dentures, Lower Dentures   Pulmonary former smoker,    Pulmonary exam normal        Cardiovascular hypertension, Pt. on medications and Pt. on home beta blockers + Peripheral Vascular Disease  Normal cardiovascular exam     Neuro/Psych negative neurological ROS  negative psych ROS   GI/Hepatic Neg liver ROS,   Endo/Other  diabetes, Well Controlled, Type 2, Oral Hypoglycemic Agents  Renal/GU negative Renal ROS  negative genitourinary   Musculoskeletal negative musculoskeletal ROS (+)   Abdominal Normal abdominal exam  (+)   Peds negative pediatric ROS (+)  Hematology  (+) anemia ,   Anesthesia Other Findings   Reproductive/Obstetrics                            Anesthesia Physical Anesthesia Plan  ASA: III  Anesthesia Plan: General   Post-op Pain Management:    Induction: Intravenous  PONV Risk Score and Plan:   Airway Management Planned: Nasal Cannula  Additional Equipment:   Intra-op Plan:   Post-operative Plan:   Informed Consent: I have reviewed the patients History and Physical, chart, labs and discussed the procedure including the risks, benefits and alternatives for the proposed anesthesia with the patient or authorized representative who has indicated his/her understanding and acceptance.   Dental advisory given  Plan Discussed with: CRNA and Surgeon  Anesthesia Plan Comments:         Anesthesia Quick Evaluation

## 2018-08-05 NOTE — H&P (Signed)
Cephas Darby, MD [address]  [place]  [place], [place] [address]  Main: [phone]  Fax: [phone] Pager: [phone]  Primary Care Physician:  Leone Haven, MD Primary Gastroenterologist:  Dr. Cephas Darby  Pre-Procedure History & Physical: HPI:  Roger [name] is a 62 y.o. male is here for an colonoscopy.   Past Medical History:  Diagnosis Date  . Chest pain    a. 04/2016 MV: small, mild apical defect c/w apical ischemia, EF 55-65%-->Low risk study.  . Diabetes mellitus without complication ([place])   . Gout   . Hematochezia   . History of echocardiogram    a. 04/2016 Echo: EF 55-60%, mild LVH, no rwma, midly dil LA.  Marland Kitchen Hypertension   . Morbid obesity ([place])     Past Surgical History:  Procedure Laterality Date  . thumb surgery      Prior to Admission medications   Medication Sig Start Date End Date Taking? Authorizing Provider  allopurinol (ZYLOPRIM) 100 MG tablet Take 1 tablet (100 mg total) by mouth daily. 06/23/18  Yes Leone Haven, MD  ibuprofen (ADVIL,MOTRIN) 800 MG tablet Take 1 tablet (800 mg total) by mouth every 8 (eight) hours as needed. 09/11/16  Yes Leone Haven, MD  lisinopril (PRINIVIL,ZESTRIL) 20 MG tablet Take 1 tablet (20 mg total) by mouth daily. 04/18/18 04/18/19 Yes Leone Haven, MD  metFORMIN (GLUCOPHAGE) 500 MG tablet Take 1 tablet (500 mg total) by mouth 2 (two) times daily with a meal. Patient taking differently: Take 500 mg by mouth daily with breakfast.  01/14/17  Yes Leone Haven, MD  metoprolol tartrate (LOPRESSOR) 25 MG tablet Take 1 tablet (25 mg total) by mouth 2 (two) times daily. 04/18/18  Yes Leone Haven, MD  isosorbide mononitrate (IMDUR) 30 MG 24 hr tablet Take 1 tablet (30 mg total) by mouth daily. 04/18/18 07/21/18  Leone Haven, MD  predniSONE (DELTASONE) 20 MG tablet Take 40 mg (2 tablets) by mouth daily for 5 days, then decrease to 20 mg (1 tablet) by mouth daily for 5 days Patient  not taking: Reported on 08/05/2018 07/21/18   Leone Haven, MD    Allergies as of 07/24/2018 - Review Complete 07/24/2018  Allergen Reaction Noted  . Albuterol Nausea And Vomiting 01/02/2017  . Mucinex [guaifenesin er] Rash 01/02/2017    Family History  Problem Relation Age of Onset  . Heart Problems Mother   . Breast cancer Mother   . Mental illness Father   . Heart disease Unknown   . Hypertension Unknown   . Diabetes Unknown     Social History   Socioeconomic History  . Marital status: Divorced    Spouse name: Not on file  . Number of children: 5  . Years of education: Not on file  . Highest education level: Not on file  Occupational History  . Not on file  Social Needs  . Financial resource strain: Not on file  . Food insecurity:    Worry: Not on file    Inability: Not on file  . Transportation needs:    Medical: Not on file    Non-medical: Not on file  Tobacco Use  . Smoking status: Former Smoker    Packs/day: 0.25    Years: 25.00    Pack years: 6.25    Types: Cigarettes    Last attempt to quit: 1994    Years since quitting: 25.7  . Smokeless tobacco: Never Used  Substance  and Sexual Activity  . Alcohol use: Yes    Alcohol/week: 48.0 standard drinks    Types: 20 Cans of beer, 28 Shots of liquor per week    Comment: everyday.   . Drug use: No  . Sexual activity: Not on file  Lifestyle  . Physical activity:    Days per week: Not on file    Minutes per session: Not on file  . Stress: Not on file  Relationships  . Social connections:    Talks on phone: Not on file    Gets together: Not on file    Attends religious service: Not on file    Active member of club or organization: Not on file    Attends meetings of clubs or organizations: Not on file    Relationship status: Not on file  . Intimate partner violence:    Fear of current or ex partner: Not on file    Emotionally abused: Not on file    Physically abused: Not on file    Forced sexual  activity: Not on file  Other Topics Concern  . Not on file  Social History Narrative  . Not on file    Review of Systems: See HPI, otherwise negative ROS  Physical Exam: BP (!) 157/93   Pulse 66   Temp (!) 96.3 F (35.7 C) (Tympanic)   Resp 18   Ht 5\' 10"  (1.778 m)   Wt 117.5 kg   SpO2 100%   BMI 37.16 kg/m  General:   Alert,  pleasant and cooperative in NAD Head:  Normocephalic and atraumatic. Neck:  Supple; no masses or thyromegaly. Lungs:  Clear throughout to auscultation.    Heart:  Regular rate and rhythm. Abdomen:  Soft, nontender and nondistended. Normal bowel sounds, without guarding, and without rebound.   Neurologic:  Alert and  oriented x4;  grossly normal neurologically.  Impression/Plan: Roger [name] is here for an colonoscopy to be performed for rectal bleeding  Risks, benefits, limitations, and alternatives regarding  colonoscopy have been reviewed with the patient.  Questions have been answered.  All parties agreeable.   Sherri Sear, MD  08/05/2018, 2:17 PM

## 2018-08-05 NOTE — Op Note (Signed)
[facility] Patient Name: Roger [name] Procedure Date: 08/05/2018 2:43 PM MRN: [id number] Account #: [id number] Date of Birth: [birth year] Admit Type: Outpatient Age: 62 Room: [facility] ENDO ROOM 4 Gender: Male Note Status: Finalized Procedure:            Colonoscopy Indications:          Rectal bleeding Providers:            Lin Landsman MD, MD Referring MD:         Angela Adam. Caryl Bis (Referring MD) Medicines:            Monitored Anesthesia Care Complications:        No immediate complications. Estimated blood loss: None. Procedure:            Pre-Anesthesia Assessment:                       - Prior to the procedure, a History and Physical was                        performed, and patient medications and allergies were                        reviewed. The patient is competent. The risks and                        benefits of the procedure and the sedation options and                        risks were discussed with the patient. All questions                        were answered and informed consent was obtained.                        Patient identification and proposed procedure were                        verified by the physician, the nurse, the                        anesthesiologist, the anesthetist and the technician in                        the pre-procedure area in the procedure room in the                        endoscopy suite. Mental Status Examination: alert and                        oriented. Airway Examination: normal oropharyngeal                        airway and neck mobility. Respiratory Examination:                        clear to auscultation. CV Examination: normal.                        Prophylactic Antibiotics: The patient does not require  prophylactic antibiotics. Prior Anticoagulants: The                        patient has taken no previous anticoagulant or                        antiplatelet  agents. ASA Grade Assessment: III - A                        patient with severe systemic disease. After reviewing                        the risks and benefits, the patient was deemed in                        satisfactory condition to undergo the procedure. The                        anesthesia plan was to use monitored anesthesia care                        (MAC). Immediately prior to administration of                        medications, the patient was re-assessed for adequacy                        to receive sedatives. The heart rate, respiratory rate,                        oxygen saturations, blood pressure, adequacy of                        pulmonary ventilation, and response to care were                        monitored throughout the procedure. The physical status                        of the patient was re-assessed after the procedure.                       After obtaining informed consent, the colonoscope was                        passed under direct vision. Throughout the procedure,                        the patient's blood pressure, pulse, and oxygen                        saturations were monitored continuously. The                        Colonoscope was introduced through the anus and                        advanced to the the cecum, identified by appendiceal  orifice and ileocecal valve. The colonoscopy was                        performed with moderate difficulty due to significant                        looping and the patient's body habitus. Successful                        completion of the procedure was aided by applying                        abdominal pressure. The patient tolerated the procedure                        fairly well. The quality of the bowel preparation was                        evaluated using the BBPS [facility] Bowel Preparation                        Scale) with scores of: Right Colon = 3 (entire mucosa                         seen well with no residual staining, small fragments of                        stool or opaque liquid), Transverse Colon = 3 (entire                        mucosa seen well with no residual staining, small                        fragments of stool or opaque liquid) and Left Colon = 2                        (minor amount of residual staining, small fragments of                        stool and/or opaque liquid, but mucosa seen well). The                        total BBPS score equals 8. Findings:      The perianal and digital rectal examinations were normal. Pertinent       negatives include normal sphincter tone and no palpable rectal lesions.      A diminutive polyp was found in the cecum. The polyp was sessile. The       polyp was removed with a cold biopsy forceps. Resection and retrieval       were complete.      A 5 mm polyp was found in the transverse colon. The polyp was sessile.       The polyp was removed with a cold snare. Resection and retrieval were       complete.      A 15 mm polyp was found in the rectum. The polyp was semi-pedunculated.       The polyp was removed with a hot snare. Resection and retrieval were  complete.      Multiple large-mouthed diverticula were found in the sigmoid colon,       transverse colon, ascending colon and cecum, right > left. There was       evidence of inspissated solid stool in diverticula. There was no       evidence of diverticular bleeding.      Internal hemorrhoids were found during retroflexion. The hemorrhoids       were large. Impression:           - One diminutive polyp in the cecum, removed with a                        cold biopsy forceps. Resected and retrieved.                       - One 5 mm polyp in the transverse colon, removed with                        a cold snare. Resected and retrieved.                       - One 15 mm polyp in the rectum, removed with a hot                        snare. Resected and  retrieved.                       - Severe diverticulosis in the sigmoid colon, in the                        transverse colon, in the ascending colon and in the                        cecum. There was evidence of an impacted diverticulum.                        There was no evidence of diverticular bleeding.                       - Internal hemorrhoids. Recommendation:       - Discharge patient to home (with escort).                       - Low fat diet and low sodium diet today.                       - Continue present medications.                       - Await pathology results.                       - Repeat colonoscopy in 3 years for surveillance of                        multiple polyps.                       - Return to my office as previously scheduled. Procedure Code(s):    --- Professional ---  45385, Colonoscopy, flexible; with removal of tumor(s),                        polyp(s), or other lesion(s) by snare technique                       45380, 59, Colonoscopy, flexible; with biopsy, single                        or multiple Diagnosis Code(s):    --- Professional ---                       D12.0, Benign neoplasm of cecum                       D12.3, Benign neoplasm of transverse colon (hepatic                        flexure or splenic flexure)                       K62.1, Rectal polyp                       K64.8, Other hemorrhoids                       K62.5, Hemorrhage of anus and rectum                       K57.30, Diverticulosis of large intestine without                        perforation or abscess without bleeding CPT copyright 2017 American Medical Association. All rights reserved. The codes documented in this report are preliminary and upon coder review may  be revised to meet current compliance requirements. Dr. Ulyess Mort Lin Landsman MD, MD 08/05/2018 3:13:03 PM This report has been signed electronically. Number of Addenda: 0 Note  Initiated On: 08/05/2018 2:43 PM Scope Withdrawal Time: 0 hours 16 minutes 25 seconds  Total Procedure Duration: 0 hours 19 minutes 46 seconds       [facility]

## 2018-08-05 NOTE — Transfer of Care (Signed)
Immediate Anesthesia Transfer of Care Note  Patient: Roger [name]  Procedure(s) Performed: COLONOSCOPY WITH PROPOFOL (N/A )  Patient Location: PACU  Anesthesia Type:General  Level of Consciousness: sedated and responds to stimulation  Airway & Oxygen Therapy: Patient Spontanous Breathing and Patient connected to nasal cannula oxygen  Post-op Assessment: Report given to RN and Post -op Vital signs reviewed and stable  Post vital signs: Reviewed and stable  Last Vitals:  Vitals Value Taken Time  BP 130/83 08/05/2018  3:12 PM  Temp    Pulse 73 08/05/2018  3:12 PM  Resp 21 08/05/2018  3:12 PM  SpO2 100 % 08/05/2018  3:12 PM  Vitals shown include unvalidated device data.  Last Pain:  Vitals:   08/05/18 1511  TempSrc: (P) Tympanic  PainSc:          Complications: No apparent anesthesia complications

## 2018-08-05 NOTE — Anesthesia Post-op Follow-up Note (Signed)
Anesthesia QCDR form completed.        

## 2018-08-06 ENCOUNTER — Ambulatory Visit: Payer: BLUE CROSS/BLUE SHIELD

## 2018-08-06 ENCOUNTER — Encounter: Admission: RE | Payer: Self-pay | Source: Ambulatory Visit

## 2018-08-06 ENCOUNTER — Encounter: Payer: Self-pay | Admitting: Gastroenterology

## 2018-08-06 ENCOUNTER — Ambulatory Visit
Admission: RE | Admit: 2018-08-06 | Payer: BLUE CROSS/BLUE SHIELD | Source: Ambulatory Visit | Admitting: Gastroenterology

## 2018-08-06 SURGERY — SIGMOIDOSCOPY, FLEXIBLE

## 2018-08-06 NOTE — OR Nursing (Signed)
FELT SPACED OUT. LARGE AMOUNT Office Spirometry Results:   BLEEDING LAST PM. ALSO DRANK ALCOHOL . DR Marius Ditch NOTIFIED.

## 2018-08-06 NOTE — Anesthesia Postprocedure Evaluation (Signed)
Anesthesia Post Note  Patient: Roger [name]  Procedure(s) Performed: COLONOSCOPY WITH PROPOFOL (N/A )  Patient location during evaluation: PACU Anesthesia Type: General Level of consciousness: awake and alert and oriented Pain management: pain level controlled Vital Signs Assessment: post-procedure vital signs reviewed and stable Respiratory status: spontaneous breathing Cardiovascular status: blood pressure returned to baseline Anesthetic complications: no     Last Vitals:  Vitals:   08/05/18 1531 08/05/18 1541  BP: (!) 183/109 (!) 181/101  Pulse: 70 63  Resp: 16 (!) 21  Temp:    SpO2: 98% 100%    Last Pain:  Vitals:   08/05/18 1541  TempSrc:   PainSc: 0-No pain                 Dereke Neumann

## 2018-08-08 ENCOUNTER — Encounter: Payer: Self-pay | Admitting: Gastroenterology

## 2018-08-08 LAB — SURGICAL PATHOLOGY

## 2018-08-14 ENCOUNTER — Inpatient Hospital Stay: Payer: BLUE CROSS/BLUE SHIELD | Admitting: Oncology

## 2018-08-26 ENCOUNTER — Inpatient Hospital Stay: Payer: BLUE CROSS/BLUE SHIELD | Attending: Oncology | Admitting: Oncology

## 2018-08-27 ENCOUNTER — Telehealth: Payer: Self-pay

## 2018-08-27 NOTE — Telephone Encounter (Signed)
Were these appointments supposed to be with Dr Annalee Genta? Dr Earlie Server [facility] with [facility]. Please follow-up with the patient and [facility] to determine who he has not been able to see. Thanks.

## 2018-08-27 NOTE — Telephone Encounter (Signed)
Copied from [place] [id number]. Topic: Referral - Status >> Aug 27, 2018 11:00 AM Burchel, Abbi R wrote: [facility] Scheduling called to notify Dr Caryl Bis that pt has cancelled 2 New pt appts last minute and no-showed 2 new pt appts with Dr Talbert Cage.  She is not going to reschedule pt.  Please f/up with pt.

## 2018-08-27 NOTE — Telephone Encounter (Signed)
Sent to PCP as an FYI  

## 2018-09-01 NOTE — Telephone Encounter (Signed)
Called and spoke with pt he stated that he has an appt with Dr. Annalee Genta this month but. Does NOT know anything about Earlie Server.

## 2018-09-04 NOTE — Telephone Encounter (Signed)
Noted  

## 2018-09-04 NOTE — Telephone Encounter (Signed)
[facility] clinic they stated that the patient was seen by Dr. Earlie Server on 08/01/2018 and he was seen by Orthopedics back in June Dr. Annalee Genta.  Sent to PCP   [facility] [phone]

## 2018-09-26 ENCOUNTER — Telehealth: Payer: Self-pay | Admitting: Family Medicine

## 2018-09-26 NOTE — Telephone Encounter (Signed)
Copied from [place] [id number]. Topic: Quick Communication - See Telephone Encounter >> Sep 26, 2018 10:42 AM Bea Graff, NT wrote: CRM for notification. See Telephone encounter for: 09/26/18. Pt would like to see if something for gout can be sent to his pharmacy or if he needs an appt. Please advise.

## 2018-09-26 NOTE — Telephone Encounter (Signed)
He needs to be seen in the office. It appears Ander Purpura has an opening this afternoon. Please offer him an appointment or he could go to the walk in clinic if he can not come in this afternoon. Thanks.

## 2018-09-26 NOTE — Telephone Encounter (Signed)
Last OV 07/21/2018 for rectal bleeding

## 2018-09-26 NOTE — Telephone Encounter (Signed)
Called and spoke with pt. Pt plans to try and be seen at an urgent care clinic or [facility]. Pt was unable to schedule an appt today with Lauren.

## 2018-09-26 NOTE — Telephone Encounter (Signed)
Sent to PCP are you willing to send in medication with an OV?

## 2018-11-06 ENCOUNTER — Emergency Department
Admission: EM | Admit: 2018-11-06 | Discharge: 2018-11-06 | Disposition: A | Discharge status: Home / Self Care | Payer: BLUE CROSS/BLUE SHIELD | Attending: Emergency Medicine | Admitting: Emergency Medicine

## 2018-11-06 ENCOUNTER — Encounter: Payer: Self-pay | Admitting: Emergency Medicine

## 2018-11-06 DIAGNOSIS — I10 Essential (primary) hypertension: Secondary | ICD-10-CM | POA: Insufficient documentation

## 2018-11-06 DIAGNOSIS — Z87891 Personal history of nicotine dependence: Secondary | ICD-10-CM | POA: Insufficient documentation

## 2018-11-06 DIAGNOSIS — M109 Gout, unspecified: Secondary | ICD-10-CM | POA: Insufficient documentation

## 2018-11-06 DIAGNOSIS — E119 Type 2 diabetes mellitus without complications: Secondary | ICD-10-CM | POA: Insufficient documentation

## 2018-11-06 DIAGNOSIS — Z79899 Other long term (current) drug therapy: Secondary | ICD-10-CM | POA: Insufficient documentation

## 2018-11-06 MED ORDER — PREDNISONE 20 MG PO TABS: ORAL_TABLET | ORAL | 0 refills | Status: DC

## 2018-11-06 MED ORDER — TRAMADOL HCL 50 MG PO TABS: 50.0000 mg | ORAL_TABLET | Freq: Four times a day (QID) | ORAL | 0 refills | Status: DC | PRN

## 2018-11-06 MED ORDER — ALLOPURINOL 100 MG PO TABS: 100.0000 mg | ORAL_TABLET | Freq: Two times a day (BID) | ORAL | 1 refills | Status: DC

## 2018-11-06 MED ORDER — DEXAMETHASONE SODIUM PHOSPHATE 10 MG/ML IJ SOLN
10.0000 mg | Freq: Once | INTRAMUSCULAR | Status: AC
  Administered 2018-11-06: 10 mg via INTRAMUSCULAR
  Filled 2018-11-06: qty 1

## 2018-11-06 NOTE — ED Triage Notes (Signed)
Pt report has gout and now has a flare up in his right ankle and left forearm. Pt states prednisone usually helps with the flare ups when the allopurinol doesn't keep it away.

## 2018-11-06 NOTE — ED Notes (Signed)
See triage note  States he thinks he is having a gout flare  Swelling noted to left knee and elbow  Also having some pain to right foot

## 2018-11-06 NOTE — ED Provider Notes (Signed)
[facility] Emergency Department Provider Note   ____________________________________________   None    (approximate)  I have reviewed the triage vital signs and the nursing notes.   HISTORY  Chief Complaint Gout   HPI Roger [name] is a 62 y.o. male presents to the ED with complaint of acute gout flareup.  Patient states that he has pain and warmth in his right foot.  Patient states that he has had several gout flareups and recently was placed on prednisone approximately 1 month ago with improvement of his gout.  He states that prednisone usually helps.  Patient continues to take allopurinol 100 mg daily to prevent gout.  Rates his pain as 10/10.   Past Medical History:  Diagnosis Date  . Chest pain    a. 04/2016 MV: small, mild apical defect c/w apical ischemia, EF 55-65%-->Low risk study.  . Diabetes mellitus without complication ([place])   . Gout   . Hematochezia   . History of echocardiogram    a. 04/2016 Echo: EF 55-60%, mild LVH, no rwma, midly dil LA.  Marland Kitchen Hypertension   . Morbid obesity [facility])     Patient Active Problem List   Diagnosis Date Noted  . Anemia 07/21/2018  . Fatigue due to excessive exertion 04/19/2018  . Nevus 04/19/2018  . Vertigo 04/19/2018  . Varicose veins of left leg with edema 04/18/2018  . Hypersomnia 01/14/2017  . Obesity 10/12/2016  . Paresthesia of left lower extremity 09/11/2016  . Rectal fissure 09/11/2016  . Gout 05/18/2016  . Erectile dysfunction 05/18/2016  . Stomach discomfort 05/18/2016  . Diabetes ([place]) 05/18/2016  . Chest pain 04/16/2016  . Blood in stool 04/16/2016  . Neuropathy 04/16/2016  . Knee pain 04/16/2016  . Essential hypertension 04/16/2016    Past Surgical History:  Procedure Laterality Date  . COLONOSCOPY WITH PROPOFOL N/A 08/05/2018   Procedure: COLONOSCOPY WITH PROPOFOL;  Surgeon: Lin Landsman, MD;  Location[facility] ENDOSCOPY;  Service: Gastroenterology;  Laterality: N/A;  .  thumb surgery      Prior to Admission medications   Medication Sig Start Date End Date Taking? Authorizing Provider  allopurinol (ZYLOPRIM) 100 MG tablet Take 1 tablet (100 mg total) by mouth 2 (two) times daily. 11/06/18 12/06/18  Johnn Hai, PA-C  ibuprofen (ADVIL,MOTRIN) 800 MG tablet Take 1 tablet (800 mg total) by mouth every 8 (eight) hours as needed. 09/11/16   Leone Haven, MD  isosorbide mononitrate (IMDUR) 30 MG 24 hr tablet Take 1 tablet (30 mg total) by mouth daily. 04/18/18 07/21/18  Leone Haven, MD  lisinopril (PRINIVIL,ZESTRIL) 20 MG tablet Take 1 tablet (20 mg total) by mouth daily. 04/18/18 04/18/19  Leone Haven, MD  metFORMIN (GLUCOPHAGE) 500 MG tablet Take 1 tablet (500 mg total) by mouth 2 (two) times daily with a meal. Patient taking differently: Take 500 mg by mouth daily with breakfast.  01/14/17   Leone Haven, MD  metoprolol tartrate (LOPRESSOR) 25 MG tablet Take 1 tablet (25 mg total) by mouth 2 (two) times daily. 04/18/18   Leone Haven, MD  predniSONE (DELTASONE) 20 MG tablet Take 40 mg (2 tablets) by mouth daily for 5 days, then decrease to 20 mg (1 tablet) by mouth daily for 5 days 11/06/18   Johnn Hai, PA-C  traMADol (ULTRAM) 50 MG tablet Take 1 tablet (50 mg total) by mouth every 6 (six) hours as needed. 11/06/18   Johnn Hai, PA-C  Allergies Albuterol and Mucinex [guaifenesin er]  Family History  Problem Relation Age of Onset  . Heart Problems Mother   . Breast cancer Mother   . Mental illness Father   . Heart disease Other   . Hypertension Other   . Diabetes Other     Social History Social History   Tobacco Use  . Smoking status: Former Smoker    Packs/day: 0.25    Years: 25.00    Pack years: 6.25    Types: Cigarettes    Last attempt to quit: 1994    Years since quitting: 25.9  . Smokeless tobacco: Never Used  Substance Use Topics  . Alcohol use: Yes    Alcohol/week: 48.0 standard drinks     Types: 20 Cans of beer, 28 Shots of liquor per week    Comment: everyday.   . Drug use: No    Review of Systems Constitutional: No fever/chills Cardiovascular: Denies chest pain. Respiratory: Denies shortness of breath. Musculoskeletal: Positive right foot pain.  Positive left knee and elbow pain. Skin: Erythema and warmth right foot Neurological: Negative for headaches, focal weakness or numbness. ____________________________________________   PHYSICAL EXAM:  VITAL SIGNS: ED Triage Vitals  Enc Vitals Group     BP 11/06/18 0848 (!) 178/103     Pulse Rate 11/06/18 0848 88     Resp 11/06/18 0848 17     Temp 11/06/18 0848 98.3 F (36.8 C)     Temp Source 11/06/18 0848 Oral     SpO2 11/06/18 0848 99 %     Weight 11/06/18 0844 260 lb (117.9 kg)     Height 11/06/18 0844 5\' 10"  (1.778 m)     Head Circumference --      Peak Flow --      Pain Score 11/06/18 0844 10     Pain Loc --      Pain Edu? --      Excl. in [place]? --    Constitutional: Alert and oriented. Well appearing and in no acute distress. Eyes: Conjunctivae are normal. PERRL. EOMI. Head: Atraumatic. Neck: No stridor.   Cardiovascular: Normal rate, regular rhythm. Grossly normal heart sounds.  Good peripheral circulation. Respiratory: Normal respiratory effort.  No retractions. Lungs CTAB. Musculoskeletal: On examination of the right foot there is some warmth and redness present along with moderate pain to palpation.  No gross deformity to suggest injury.  Patient also has some left elbow tenderness to palpation but no gross deformity and no warmth was noted in this area. Neurologic:  Normal speech and language. No gross focal neurologic deficits are appreciated. No gait instability. Skin:  Skin is warm, dry and intact.  Psychiatric: Mood and affect are normal. Speech and behavior are normal.  ____________________________________________   LABS (all labs ordered are listed, but only abnormal results are  displayed)  Labs Reviewed - No data to display  PROCEDURES  Procedure(s) performed: None  Procedures  Critical Care performed: No  ____________________________________________   INITIAL IMPRESSION / ASSESSMENT AND PLAN / ED COURSE  As part of my medical decision making, I reviewed the following data within the electronic MEDICAL RECORD NUMBER Notes from prior ED visits and [place] Controlled Substance Database  Patient presents to the ED with complaint of acute gout flareup.  Patient has been having increasing episodes of gout.  Currently he is taking allopurinol 100 mg once daily.  He has had relief in the past with prednisone.  Physical exam is consistent with right foot erythema and  warmth along with moderate tenderness to palpation.  We discussed increasing his allopurinol to 100 mg twice daily and attempt to decrease the amount of flareups that he has.  He is encouraged to follow-up with his PCP for this.  He was also given a prescription for tramadol as needed for pain and prednisone as he is taken in the past.   ____________________________________________   FINAL CLINICAL IMPRESSION(S) / ED DIAGNOSES  Final diagnoses:  Acute gout of right ankle, unspecified cause     ED Discharge Orders         Ordered    allopurinol (ZYLOPRIM) 100 MG tablet  2 times daily     11/06/18 0954    predniSONE (DELTASONE) 20 MG tablet     11/06/18 0954    traMADol (ULTRAM) 50 MG tablet  Every 6 hours PRN     11/06/18 0954           Note:  This document was prepared using Dragon voice recognition software and may include unintentional dictation errors.    Johnn Hai, PA-C 11/06/18 1652    Schaevitz, Randall An, MD 11/07/18 [phone]

## 2018-11-06 NOTE — Discharge Instructions (Addendum)
Call make an appointment with your primary care provider.  Because of your frequent gout attacks we are increasing your allopurinol to 1 tablet twice a day.  Prescription was given to accommodate the increase in medication.  Also begin taking prednisone as directed and tramadol if needed for pain.

## 2018-11-10 ENCOUNTER — Ambulatory Visit: Payer: BLUE CROSS/BLUE SHIELD | Admitting: Family Medicine

## 2018-12-08 ENCOUNTER — Encounter: Payer: Self-pay | Admitting: Family Medicine

## 2018-12-08 ENCOUNTER — Ambulatory Visit (INDEPENDENT_AMBULATORY_CARE_PROVIDER_SITE_OTHER): Payer: BLUE CROSS/BLUE SHIELD | Admitting: Family Medicine

## 2018-12-08 VITALS — BP 140/98 | HR 76 | Temp 98.1°F | Ht 70.0 in | Wt 260.8 lb

## 2018-12-08 DIAGNOSIS — M109 Gout, unspecified: Secondary | ICD-10-CM

## 2018-12-08 DIAGNOSIS — K921 Melena: Secondary | ICD-10-CM

## 2018-12-08 DIAGNOSIS — I10 Essential (primary) hypertension: Secondary | ICD-10-CM | POA: Diagnosis not present

## 2018-12-08 MED ORDER — PREDNISONE 20 MG PO TABS: ORAL_TABLET | ORAL | 0 refills | Status: DC

## 2018-12-08 NOTE — Patient Instructions (Signed)
Nice to see you. We will treat your gout with the prednisone taper. I have referred you to rheumatology. You will need to set up an establish care visit with a new PCP.  Please check with your insurance regarding who is covered.

## 2018-12-08 NOTE — Assessment & Plan Note (Signed)
Symptoms most consistent with gout.  Discussed treatment with a prolonged prednisone taper.  Advised referral to rheumatology.  This was placed.  This is unlikely to represent a DVT given recent improvement in swelling though I did discuss obtaining an ultrasound given the increased swelling in his left leg to evaluate further.  He declined this.  If his symptoms do not improve with prednisone he knows to follow-up.  He is given return precautions.

## 2018-12-08 NOTE — Assessment & Plan Note (Addendum)
Above goal.  We will add amlodipine given that the patient declines lab work.  Patient notes that he will no longer able to come to our office given change in his insurance.  I encouraged him to establish care with a new provider as soon as he can.

## 2018-12-08 NOTE — Progress Notes (Signed)
[name], MD Phone: [phone]  Roger [name] is a 63 y.o. male who presents today for follow-up.  CC: Gout, rectal bleeding, hypertension  Gout: Patient reports issues with gout over the last month or so.  Notes his left knee started bothering him though that has improved somewhat.  His left foot started recently with discomfort over the dorsum and laterally.  He notes his left leg has been more swollen in his calf though over the last several days has been improving to his baseline.  He notes he has received prednisone on 2 occasions recently.  It appears these were 6-10 day tapers.  He notes no blood clot history.  He is taking his allopurinol.  He does drink alcohol.  He has cut down on beer from 4-5 per night to 2/day.  He is drinking about half a gallon of gin every 4 to 5 days.  Hypertension: Typically 098-119 systolically at home.  He is taking Imdur, lisinopril, and metoprolol.  No chest pain or shortness of breath.  Rectal bleeding: He notes he has had some over the last 4 days.  He saw GI previously and had a colonoscopy.  He had severe diverticulosis.  No evidence of bleeding.  He also had internal hemorrhoids.  Polyps were removed.  He does note some fatigue.  He declines rectal exam.  He declines lab work.  Social History   Tobacco Use  Smoking Status Former Smoker  . Packs/day: 0.25  . Years: 25.00  . Pack years: 6.25  . Types: Cigarettes  . Last attempt to quit: 1994  . Years since quitting: 26.0  Smokeless Tobacco Never Used     ROS see history of present illness  Objective  Physical Exam Vitals:   12/08/18 1524  BP: (!) 140/98  Pulse: 76  Temp: 98.1 F (36.7 C)  SpO2: 97%    BP Readings from Last 3 Encounters:  12/08/18 (!) 140/98  11/06/18 (!) 178/103  08/05/18 (!) 181/101   Wt Readings from Last 3 Encounters:  12/08/18 260 lb 12.8 oz (118.3 kg)  11/06/18 260 lb (117.9 kg)  08/05/18 259 lb (117.5 kg)    Physical Exam Constitutional:       General: He is not in acute distress.    Appearance: He is not diaphoretic.  Cardiovascular:     Rate and Rhythm: Normal rate and regular rhythm.     Heart sounds: Normal heart sounds.  Pulmonary:     Effort: Pulmonary effort is normal.     Breath sounds: Normal breath sounds.  Genitourinary:    Comments: Patient declined rectal exam Musculoskeletal:     Comments: Left lower extremity with 1+ pitting edema, there are varicose veins that are chronic, there is no tenderness of the calf or lower leg, the left foot has mild swelling with tenderness over the dorsum and medial and lateral portions, there is warmth, there is no erythema, there is no tenderness of the left knee, there is no significant swelling of the left knee, 2+ left DP pulse, right lower extremity with trace edema, no varicose veins  Skin:    General: Skin is warm and dry.  Neurological:     Mental Status: He is alert.      Assessment/Plan: Please see individual problem list.  Blood in stool I advised rectal exam though patient declined.  I also advised obtaining lab work to ensure that he was not significantly anemic though he declined.  Given this I advised monitoring and  if symptoms worsen to being evaluated.  Given return precautions.  Essential hypertension Above goal.  We will add amlodipine given that the patient declines lab work.  Patient notes that he will no longer able to come to our office given change in his insurance.  I encouraged him to establish care with a new provider as soon as he can.  Gout Symptoms most consistent with gout.  Discussed treatment with a prolonged prednisone taper.  Advised referral to rheumatology.  This was placed.  This is unlikely to represent a DVT given recent improvement in swelling though I did discuss obtaining an ultrasound given the increased swelling in his left leg to evaluate further.  He declined this.  If his symptoms do not improve with prednisone he knows to  follow-up.  He is given return precautions.   Orders Placed This Encounter  Procedures  . Ambulatory referral to Rheumatology    Referral Priority:   Routine    Referral Type:   Consultation    Referral Reason:   Specialty Services Required    Requested Specialty:   Rheumatology    Number of Visits Requested:   1    Meds ordered this encounter  Medications  . predniSONE (DELTASONE) 20 MG tablet    Sig: Take 40 mg (2 tablets) by mouth daily for 4 days, then take 20 mg (1 tablet) by mouth daily for 4 days, then take 10 mg (half a tablet) by mouth daily for 4 days    Dispense:  14 tablet    Refill:  0  . amLODipine (NORVASC) 5 MG tablet    Sig: Take 1 tablet (5 mg total) by mouth daily.    Dispense:  90 tablet    Refill:  [place], MD [place]

## 2018-12-08 NOTE — Assessment & Plan Note (Addendum)
I advised rectal exam though patient declined.  I also advised obtaining lab work to ensure that he was not significantly anemic though he declined.  Given this I advised monitoring and if symptoms worsen to being evaluated.  Given return precautions.

## 2018-12-09 ENCOUNTER — Telehealth: Payer: Self-pay | Admitting: Family Medicine

## 2018-12-09 MED ORDER — AMLODIPINE BESYLATE 5 MG PO TABS: 5.0000 mg | ORAL_TABLET | Freq: Every day | ORAL | 3 refills | Status: DC

## 2018-12-09 NOTE — Telephone Encounter (Signed)
Please let the patient know that I sent in amlodipine for him to take for his blood pressure.  He does not need any follow-up lab work with starting this medication though he does need a follow-up blood pressure check.  He mentioned that he would have to find a new PCP given a change to his insurance.  I would encourage him to find a new PCP to have this rechecked in about 1 month.  If he would like we could do this at our office if he is unable to find somebody to be seen at that point or if he is going to be able to continue to come see [place].

## 2018-12-09 NOTE — Telephone Encounter (Signed)
Called and spoke with patient. Pt advised and voiced understanding.  

## 2019-02-11 ENCOUNTER — Telehealth: Payer: Self-pay | Admitting: Family Medicine

## 2019-02-11 DIAGNOSIS — M109 Gout, unspecified: Secondary | ICD-10-CM

## 2019-02-11 MED ORDER — ALLOPURINOL 100 MG PO TABS: 100.0000 mg | ORAL_TABLET | Freq: Two times a day (BID) | ORAL | 1 refills | Status: DC

## 2019-02-11 MED ORDER — PREDNISONE 20 MG PO TABS: ORAL_TABLET | ORAL | 0 refills | Status: DC

## 2019-02-11 NOTE — Telephone Encounter (Signed)
Called and spoke with pt. Pt advised that PCP is out of the office. Pt would like to have something sent in today if possible.   Sent to Dr. Derrel Nip for approval since PCP is out of the office.  Thanks

## 2019-02-11 NOTE — Telephone Encounter (Signed)
Copied from [place] [id number]. Topic: Quick Communication - Rx Refill/Question >> Feb 11, 2019 11:30 AM Margot Ables wrote: Medication: allopurinol (ZYLOPRIM) 100 MG tablet  & predniSONE (DELTASONE) 20 MG tablet - pt is having gout flare up in right leg/right big toe/right hand - pt states he now has [facility] and cannot come in for appt as [facility] does not accept ins. Pt is scheduling with another office but not able to get in right away.  Has the patient contacted their pharmacy? yes Preferred Pharmacy (with phone number or street name): [id number] [address], [place] [phone] (Phone) [phone] (Fax)

## 2019-02-11 NOTE — Telephone Encounter (Signed)
Called and spoke with pt. Pt advised Rx has been sent into pt's pharmacy.

## 2019-02-11 NOTE — Telephone Encounter (Signed)
Last OV 12/08/2018 for Gout   Last refilled prednisone 12/08/2018 disp 14 with no refills   Per last OV notes from PCP   "Gout Symptoms most consistent with gout.  Discussed treatment with a prolonged prednisone taper.  Advised referral to rheumatology.  This was placed.  This is unlikely to represent a DVT given recent improvement in swelling though I did discuss obtaining an ultrasound given the increased swelling in his left leg to evaluate further.  He declined this.  If his symptoms do not improve with prednisone he knows to follow-up.  He is given return precautions."

## 2019-02-11 NOTE — Telephone Encounter (Signed)
Medications refilled and sent to pharmacy.

## 2019-03-18 ENCOUNTER — Ambulatory Visit: Payer: Self-pay | Admitting: Family Medicine

## 2019-04-02 ENCOUNTER — Telehealth: Payer: Self-pay

## 2019-04-02 NOTE — Telephone Encounter (Signed)
Called patient from recall.  No answer. LMOV.  This is the second attempt per recall.

## 2019-04-21 NOTE — Telephone Encounter (Signed)
Called patient from recall list.  No answer. LMOV.  This is the 3rd attempt per recall.  Will delete recall.

## 2019-05-11 ENCOUNTER — Other Ambulatory Visit: Payer: Self-pay | Admitting: Family Medicine

## 2019-05-27 ENCOUNTER — Emergency Department
Admission: EM | Admit: 2019-05-27 | Discharge: 2019-05-27 | Disposition: A | Discharge status: Home / Self Care | Payer: BLUE CROSS/BLUE SHIELD | Attending: Emergency Medicine | Admitting: Emergency Medicine

## 2019-05-27 ENCOUNTER — Other Ambulatory Visit: Payer: Self-pay

## 2019-05-27 ENCOUNTER — Encounter: Payer: Self-pay | Admitting: Emergency Medicine

## 2019-05-27 DIAGNOSIS — E119 Type 2 diabetes mellitus without complications: Secondary | ICD-10-CM | POA: Diagnosis not present

## 2019-05-27 DIAGNOSIS — I10 Essential (primary) hypertension: Secondary | ICD-10-CM | POA: Diagnosis not present

## 2019-05-27 DIAGNOSIS — M109 Gout, unspecified: Secondary | ICD-10-CM | POA: Insufficient documentation

## 2019-05-27 DIAGNOSIS — Z87891 Personal history of nicotine dependence: Secondary | ICD-10-CM | POA: Insufficient documentation

## 2019-05-27 DIAGNOSIS — M79672 Pain in left foot: Secondary | ICD-10-CM | POA: Diagnosis present

## 2019-05-27 DIAGNOSIS — Z79899 Other long term (current) drug therapy: Secondary | ICD-10-CM | POA: Insufficient documentation

## 2019-05-27 DIAGNOSIS — Z7984 Long term (current) use of oral hypoglycemic drugs: Secondary | ICD-10-CM | POA: Diagnosis not present

## 2019-05-27 MED ORDER — PREDNISONE 20 MG PO TABS
60.0000 mg | ORAL_TABLET | Freq: Once | ORAL | Status: AC
  Administered 2019-05-27: 60 mg via ORAL
  Filled 2019-05-27: qty 3

## 2019-05-27 MED ORDER — PREDNISONE 10 MG (21) PO TBPK: ORAL_TABLET | ORAL | 0 refills | Status: DC

## 2019-05-27 NOTE — ED Notes (Signed)
Patient told to take his scheduled hypertensive medication when he gets home

## 2019-05-27 NOTE — ED Triage Notes (Signed)
PT c/o LFT great toe pain and swelling x1wk. PT hx of gout. + swelling noted, VSS

## 2019-05-27 NOTE — ED Notes (Signed)
See triage note  Presents with pain to left foot  Left great red and swollen   Hx of gout  States pain started about 1 week ago

## 2019-05-27 NOTE — ED Provider Notes (Signed)
[facility] Emergency Department Provider Note  ____________________________________________  Time seen: Approximately 8:49 PM  I have reviewed the triage vital signs and the nursing notes.   HISTORY  Chief Complaint Gout    HPI SHI BLANKENSHIP is a 63 y.o. male presents to the emergency department with 10 on 10 left great toe pain.  Toe is erythematous and painful to the touch.  Patient states that he has had pain for the past 2 to 3 days.  Patient states that he has a history of gout and his symptoms feel similar to prior gout flares in the past.  Patient states that he has had more red meat, seafood and alcohol than usual.  Patient states that he typically is treated with tapered prednisone and that resolves his symptoms.  Denies a history of cellulitis or septic joints.  No other alleviating measures have been attempted.        Past Medical History:  Diagnosis Date  . Chest pain    a. 04/2016 MV: small, mild apical defect c/w apical ischemia, EF 55-65%-->Low risk study.  . Diabetes mellitus without complication ([place])   . Gout   . Hematochezia   . History of echocardiogram    a. 04/2016 Echo: EF 55-60%, mild LVH, no rwma, midly dil LA.  Marland Kitchen Hypertension   . Morbid obesity [facility])     Patient Active Problem List   Diagnosis Date Noted  . Anemia 07/21/2018  . Fatigue due to excessive exertion 04/19/2018  . Nevus 04/19/2018  . Vertigo 04/19/2018  . Varicose veins of left leg with edema 04/18/2018  . Hypersomnia 01/14/2017  . Obesity 10/12/2016  . Paresthesia of left lower extremity 09/11/2016  . Rectal fissure 09/11/2016  . Gout 05/18/2016  . Erectile dysfunction 05/18/2016  . Stomach discomfort 05/18/2016  . Diabetes ([place]) 05/18/2016  . Chest pain 04/16/2016  . Blood in stool 04/16/2016  . Neuropathy 04/16/2016  . Knee pain 04/16/2016  . Essential hypertension 04/16/2016    Past Surgical History:  Procedure Laterality Date  . COLONOSCOPY  WITH PROPOFOL N/A 08/05/2018   Procedure: COLONOSCOPY WITH PROPOFOL;  Surgeon: Lin Landsman, MD;  Location: [facility] ENDOSCOPY;  Service: Gastroenterology;  Laterality: N/A;  . thumb surgery      Prior to Admission medications   Medication Sig Start Date End Date Taking? Authorizing Provider  allopurinol (ZYLOPRIM) 100 MG tablet Take 1 tablet (100 mg total) by mouth 2 (two) times daily for 30 days. 02/11/19 03/13/19  Crecencio Mc, MD  amLODipine (NORVASC) 5 MG tablet Take 1 tablet (5 mg total) by mouth daily. 12/09/18   Leone Haven, MD  ibuprofen (ADVIL,MOTRIN) 800 MG tablet Take 1 tablet (800 mg total) by mouth every 8 (eight) hours as needed. 09/11/16   Leone Haven, MD  isosorbide mononitrate (IMDUR) 30 MG 24 hr tablet TAKE 1 TABLET(30 MG) BY MOUTH DAILY 05/13/19   Leone Haven, MD  lisinopril (ZESTRIL) 20 MG tablet TAKE 1 TABLET(20 MG) BY MOUTH DAILY 05/13/19   Leone Haven, MD  metFORMIN (GLUCOPHAGE) 500 MG tablet Take 1 tablet (500 mg total) by mouth 2 (two) times daily with a meal. Patient taking differently: Take 500 mg by mouth daily with breakfast.  01/14/17   Leone Haven, MD  metoprolol tartrate (LOPRESSOR) 25 MG tablet Take 1 tablet (25 mg total) by mouth 2 (two) times daily. 04/18/18   Leone Haven, MD  predniSONE (STERAPRED UNI-PAK 21 TAB) 10 MG (21)  TBPK tablet Take 6 tablets the first day, take 5 tablets the second day, take 4 tablets the third day, take 3 tablets the fourth day, take 2 tablets the fifth day, take 1 tablet the sixth day. 05/27/19   Lannie Fields, PA-C    Allergies Albuterol and Mucinex [guaifenesin er]  Family History  Problem Relation Age of Onset  . Heart Problems Mother   . Breast cancer Mother   . Mental illness Father   . Heart disease Other   . Hypertension Other   . Diabetes Other     Social History Social History   Tobacco Use  . Smoking status: Former Smoker    Packs/day: 0.25    Years: 25.00    Pack  years: 6.25    Types: Cigarettes    Quit date: 1994    Years since quitting: 26.5  . Smokeless tobacco: Never Used  Substance Use Topics  . Alcohol use: Yes    Alcohol/week: 48.0 standard drinks    Types: 20 Cans of beer, 28 Shots of liquor per week    Comment: everyday.   . Drug use: No     Review of Systems  Constitutional: No fever/chills Eyes: No visual changes. No discharge ENT: No upper respiratory complaints. Cardiovascular: no chest pain. Respiratory: no cough. No SOB. Gastrointestinal: No abdominal pain.  No nausea, no vomiting.  No diarrhea.  No constipation. Musculoskeletal: Patient has left great toe pain.  Skin: Negative for rash, abrasions, lacerations, ecchymosis. Neurological: Negative for headaches, focal weakness or numbness.   ____________________________________________   PHYSICAL EXAM:  VITAL SIGNS: ED Triage Vitals  Enc Vitals Group     BP 05/27/19 1753 (!) 187/98     Pulse Rate 05/27/19 1752 77     Resp 05/27/19 1752 16     Temp 05/27/19 1752 99.5 F (37.5 C)     Temp Source 05/27/19 1752 Oral     SpO2 05/27/19 1752 98 %     Weight --      Height --      Head Circumference --      Peak Flow --      Pain Score 05/27/19 1752 10     Pain Loc --      Pain Edu? --      Excl. in [place]? --      Constitutional: Alert and oriented. Well appearing and in no acute distress. Eyes: Conjunctivae are normal. PERRL. EOMI. Head: Atraumatic. Cardiovascular: Normal rate, regular rhythm. Normal S1 and S2.  Good peripheral circulation. Respiratory: Normal respiratory effort without tachypnea or retractions. Lungs CTAB. Good air entry to the bases with no decreased or absent breath sounds. Musculoskeletal: Patient can perform full range of motion at the left great toe.  Left great toe appears edematous. Neurologic:  Normal speech and language. No gross focal neurologic deficits are appreciated.  Skin: Left great toe is erythematous. Psychiatric: Mood and  affect are normal. Speech and behavior are normal. Patient exhibits appropriate insight and judgement.   ____________________________________________   LABS (all labs ordered are listed, but only abnormal results are displayed)  Labs Reviewed - No data to display ____________________________________________  EKG   ____________________________________________  RADIOLOGY   No results found.  ____________________________________________    PROCEDURES  Procedure(s) performed:    Procedures    Medications  predniSONE (DELTASONE) tablet 60 mg (60 mg Oral Given 05/27/19 2010)     ____________________________________________   INITIAL IMPRESSION / ASSESSMENT AND PLAN / ED COURSE  Pertinent labs & imaging results that were available during my care of the patient were reviewed by me and considered in my medical decision making (see chart for details).  Review of the [place] CSRS was performed in accordance of the [place] prior to dispensing any controlled drugs.           Assessment and plan: Gout 63 year old male presents to the emergency department with left great toe pain consistent with prior gout flares in the past.  Patient was treated with prednisone per his request.  He was advised to reduce consumption of seafood, red meat and alcohol in order to improve his symptoms.  All patient questions were answered.    ____________________________________________  FINAL CLINICAL IMPRESSION(S) / ED DIAGNOSES  Final diagnoses:  Acute gout involving toe of left foot, unspecified cause      NEW MEDICATIONS STARTED DURING THIS VISIT:  ED Discharge Orders         Ordered    predniSONE (STERAPRED UNI-PAK 21 TAB) 10 MG (21) TBPK tablet     05/27/19 2002              This chart was dictated using voice recognition software/Dragon. Despite best efforts to proofread, errors can occur which can change the meaning. Any change was purely unintentional.    Lannie Fields, PA-C 05/27/19 2054    Delman Kitten, MD 05/30/19 1436

## 2019-06-25 ENCOUNTER — Other Ambulatory Visit: Payer: Self-pay

## 2019-06-25 ENCOUNTER — Encounter: Payer: Self-pay | Admitting: Emergency Medicine

## 2019-06-25 ENCOUNTER — Emergency Department
Admission: EM | Admit: 2019-06-25 | Discharge: 2019-06-25 | Disposition: A | Discharge status: Home / Self Care | Payer: BLUE CROSS/BLUE SHIELD | Attending: Emergency Medicine | Admitting: Emergency Medicine

## 2019-06-25 DIAGNOSIS — E119 Type 2 diabetes mellitus without complications: Secondary | ICD-10-CM | POA: Insufficient documentation

## 2019-06-25 DIAGNOSIS — Z87891 Personal history of nicotine dependence: Secondary | ICD-10-CM | POA: Insufficient documentation

## 2019-06-25 DIAGNOSIS — M25561 Pain in right knee: Secondary | ICD-10-CM | POA: Diagnosis present

## 2019-06-25 DIAGNOSIS — I10 Essential (primary) hypertension: Secondary | ICD-10-CM | POA: Diagnosis not present

## 2019-06-25 DIAGNOSIS — M1000 Idiopathic gout, unspecified site: Secondary | ICD-10-CM | POA: Diagnosis not present

## 2019-06-25 DIAGNOSIS — Z79899 Other long term (current) drug therapy: Secondary | ICD-10-CM | POA: Diagnosis not present

## 2019-06-25 MED ORDER — TRIAMTERENE-HCTZ 37.5-25 MG PO TABS
1.0000 | ORAL_TABLET | Freq: Every day | ORAL | Status: DC
  Administered 2019-06-25: 1 via ORAL
  Filled 2019-06-25: qty 1

## 2019-06-25 MED ORDER — COLCHICINE 0.6 MG PO TABS
1.2000 mg | ORAL_TABLET | Freq: Once | ORAL | Status: AC
  Administered 2019-06-25: 1.2 mg via ORAL
  Filled 2019-06-25: qty 2

## 2019-06-25 MED ORDER — TRIAMTERENE-HCTZ 37.5-25 MG PO TABS: 1.0000 | ORAL_TABLET | Freq: Every day | ORAL | 11 refills | Status: DC

## 2019-06-25 MED ORDER — OXYCODONE-ACETAMINOPHEN 7.5-325 MG PO TABS: 1.0000 | ORAL_TABLET | ORAL | 0 refills | Status: DC | PRN

## 2019-06-25 MED ORDER — OXYCODONE-ACETAMINOPHEN 5-325 MG PO TABS
2.0000 | ORAL_TABLET | Freq: Once | ORAL | Status: AC
  Administered 2019-06-25: 2 via ORAL
  Filled 2019-06-25: qty 2

## 2019-06-25 MED ORDER — PREDNISONE 50 MG PO TABS: ORAL_TABLET | ORAL | 0 refills | Status: DC

## 2019-06-25 MED ORDER — COLCHICINE 0.6 MG PO TABS: 0.6000 mg | ORAL_TABLET | Freq: Two times a day (BID) | ORAL | 0 refills | Status: DC

## 2019-06-25 MED ORDER — PREDNISONE 20 MG PO TABS
60.0000 mg | ORAL_TABLET | Freq: Once | ORAL | Status: AC
  Administered 2019-06-25: 60 mg via ORAL
  Filled 2019-06-25: qty 3

## 2019-06-25 NOTE — ED Notes (Signed)
AAOx3.  Skin warm and dry.  NAD 

## 2019-06-25 NOTE — ED Provider Notes (Addendum)
[facility] Emergency Department Provider Note       Time seen: ----------------------------------------- 7:15 AM on 06/25/2019 -----------------------------------------   I have reviewed the triage vital signs and the nursing notes.  HISTORY   Chief Complaint Knee Pain    HPI Roger [name] is a 63 y.o. male with a history of diabetes, hypertension, gout who presents to the ED for severe right knee pain for the past week.  Patient states is getting worse despite allopurinol and indomethacin.  Patient reports he takes lisinopril for blood pressure but is not sure if it has been under control.  Right knee pain is 10 out of 10.  Past Medical History:  Diagnosis Date  . Chest pain    a. 04/2016 MV: small, mild apical defect c/w apical ischemia, EF 55-65%-->Low risk study.  . Diabetes mellitus without complication ([place])   . Gout   . Hematochezia   . History of echocardiogram    a. 04/2016 Echo: EF 55-60%, mild LVH, no rwma, midly dil LA.  Marland Kitchen Hypertension   . Morbid obesity [facility])     Patient Active Problem List   Diagnosis Date Noted  . Anemia 07/21/2018  . Fatigue due to excessive exertion 04/19/2018  . Nevus 04/19/2018  . Vertigo 04/19/2018  . Varicose veins of left leg with edema 04/18/2018  . Hypersomnia 01/14/2017  . Obesity 10/12/2016  . Paresthesia of left lower extremity 09/11/2016  . Rectal fissure 09/11/2016  . Gout 05/18/2016  . Erectile dysfunction 05/18/2016  . Stomach discomfort 05/18/2016  . Diabetes ([place]) 05/18/2016  . Chest pain 04/16/2016  . Blood in stool 04/16/2016  . Neuropathy 04/16/2016  . Knee pain 04/16/2016  . Essential hypertension 04/16/2016    Past Surgical History:  Procedure Laterality Date  . COLONOSCOPY WITH PROPOFOL N/A 08/05/2018   Procedure: COLONOSCOPY WITH PROPOFOL;  Surgeon: Lin Landsman, MD;  Location: [facility] ENDOSCOPY;  Service: Gastroenterology;  Laterality: N/A;  . thumb surgery       Allergies Albuterol and Mucinex [guaifenesin er]  Social History Social History   Tobacco Use  . Smoking status: Former Smoker    Packs/day: 0.25    Years: 25.00    Pack years: 6.25    Types: Cigarettes    Quit date: 1994    Years since quitting: 26.5  . Smokeless tobacco: Never Used  Substance Use Topics  . Alcohol use: Yes    Alcohol/week: 48.0 standard drinks    Types: 20 Cans of beer, 28 Shots of liquor per week    Comment: everyday.   . Drug use: No   Review of Systems Constitutional: Negative for fever. Cardiovascular: Negative for chest pain. Respiratory: Negative for shortness of breath. Gastrointestinal: Negative for abdominal pain, vomiting and diarrhea. Musculoskeletal: Positive right knee pain Skin: Negative for rash. Neurological: Negative for headaches, focal weakness or numbness.  All systems negative/normal/unremarkable except as stated in the HPI  ____________________________________________   PHYSICAL EXAM:  VITAL SIGNS: ED Triage Vitals [06/25/19 0709]  Enc Vitals Group     BP (!) 226/119     Pulse Rate 78     Resp 16     Temp 98.9 F (37.2 C)     Temp Source Oral     SpO2 98 %     Weight      Height      Head Circumference      Peak Flow      Pain Score 10     Pain  Loc      Pain Edu?      Excl. in [place]?    Constitutional: Alert and oriented. Well appearing and in no distress. Eyes: Conjunctivae are normal. Normal extraocular movements. Cardiovascular: Normal rate, regular rhythm. No murmurs, rubs, or gallops. Respiratory: Normal respiratory effort without tachypnea nor retractions. Breath sounds are clear and equal bilaterally. No wheezes/rales/rhonchi. Gastrointestinal: Soft and nontender. Normal bowel sounds Musculoskeletal: Tenderness is noted to the right knee with some warmth.  Pain with range of motion of the right knee. Neurologic:  Normal speech and language. No gross focal neurologic deficits are appreciated.  Skin:   Skin is warm, dry and intact. No rash noted. Psychiatric: Mood and affect are normal. Speech and behavior are normal.  ____________________________________________  ED COURSE:  As part of my medical decision making, I reviewed the following data within the [place] History obtained from family if available, nursing notes, old chart and ekg, as well as notes from prior ED visits. Patient presented for right knee pain, we will attempt pain control and reevaluate his blood pressure.   Procedures  ASHON ROSENBERG was evaluated in Emergency Department on 06/25/2019 for the symptoms described in the history of present illness. He was evaluated in the context of the global COVID-19 pandemic, which necessitated consideration that the patient might be at risk for infection with the SARS-CoV-2 virus that causes COVID-19. Institutional protocols and algorithms that pertain to the evaluation of patients at risk for COVID-19 are in a state of rapid change based on information released by regulatory bodies including the CDC and federal and state organizations. These policies and algorithms were followed during the patient's care in the ED.   ____________________________________________   DIFFERENTIAL DIAGNOSIS   Gout, hypertension  FINAL ASSESSMENT AND PLAN  Gout, hypertension   Plan: The patient had presented for right knee pain.  Patient was given colchicine, prednisone, Percocet and Maxide.  I will take him off of his lisinopril, he will continue home with a similar treatment plan for his gout.  Blood pressure currently 158/97.   Laurence Aly, MD    Note: This note was generated in part or whole with voice recognition software. Voice recognition is usually quite accurate but there are transcription errors that can and very often do occur. I apologize for any typographical errors that were not detected and corrected.     Earleen Newport, MD 06/25/19 0488     Earleen Newport, MD 06/25/19 [id number]

## 2019-06-25 NOTE — ED Triage Notes (Signed)
Says gout right knee for a week.  Getting worse despite alopurinol and indomethacin.

## 2019-07-16 ENCOUNTER — Ambulatory Visit: Payer: Self-pay | Admitting: Nurse Practitioner

## 2019-07-17 ENCOUNTER — Ambulatory Visit (INDEPENDENT_AMBULATORY_CARE_PROVIDER_SITE_OTHER): Payer: Self-pay | Admitting: Nurse Practitioner

## 2019-07-17 ENCOUNTER — Encounter: Payer: Self-pay | Admitting: Nurse Practitioner

## 2019-07-17 ENCOUNTER — Other Ambulatory Visit: Payer: Self-pay

## 2019-07-17 VITALS — BP 111/72 | HR 72 | Ht 70.0 in | Wt 236.0 lb

## 2019-07-17 DIAGNOSIS — E860 Dehydration: Secondary | ICD-10-CM

## 2019-07-17 DIAGNOSIS — E1165 Type 2 diabetes mellitus with hyperglycemia: Secondary | ICD-10-CM

## 2019-07-17 DIAGNOSIS — M1A09X0 Idiopathic chronic gout, multiple sites, without tophus (tophi): Secondary | ICD-10-CM

## 2019-07-17 DIAGNOSIS — Z7689 Persons encountering health services in other specified circumstances: Secondary | ICD-10-CM

## 2019-07-17 DIAGNOSIS — R42 Dizziness and giddiness: Secondary | ICD-10-CM

## 2019-07-17 MED ORDER — COLCHICINE 0.6 MG PO TABS: 0.6000 mg | ORAL_TABLET | Freq: Two times a day (BID) | ORAL | 0 refills | Status: DC

## 2019-07-17 MED ORDER — METFORMIN HCL 500 MG PO TABS: 500.0000 mg | ORAL_TABLET | Freq: Two times a day (BID) | ORAL | 1 refills | Status: DC

## 2019-07-17 MED ORDER — MECLIZINE HCL 12.5 MG PO TABS: 12.5000 mg | ORAL_TABLET | Freq: Three times a day (TID) | ORAL | 0 refills | Status: DC | PRN

## 2019-07-17 NOTE — Patient Instructions (Addendum)
Roger [name],   Thank you for coming in to clinic today.  1. Continue current blood pressure medicines - STAY off lisinpril for now BP is normal without it.  2. Continue allopurinol 100 mg twice daily for now.  We will increase if needed after labs.  Gout goal for uric acid blood test is less than 6.0.   3. Dizziness - take new med: meclizine 12.5 mg tablet  Take 1/2-1 tablet up to 3 times daily as needed for dizziness.  Don't take when driving because it can make you sleepy. -- Short term to help symptoms only.   - You are probably dehydrated.  Drink at least 6-8 bottles of water daily.  - may alternate 2-3 bottles of water with 1 bottle Gatorade (G2/no sugar brand).  Diabetes: - Probably at goal once you get back on metformin. A1c should be less than 7.0  Please schedule a follow-up appointment with [name], AGNP. Return in about 3 months (around 10/17/2019) for diabetes, hypertension.  If you have any other questions or concerns, please feel free to call the clinic or send a message through [facility]. You may also schedule an earlier appointment if necessary.  You will receive a survey after today's visit either digitally by e-mail or paper by [employer]. Your experiences and feedback matter to [place].  Please respond so we know how we are doing as we provide care for you.   [name], DNP, AGNP-BC Adult Gerontology Nurse Practitioner [place]

## 2019-07-17 NOTE — Progress Notes (Signed)
Subjective:    Patient ID: Roger [name], male    DOB: [birth year], 63 y.o.   MRN: [id number]  Roger [name] is a 63 y.o. male presenting on 07/17/2019 for Establish Care (intermittent dizziness, SOB, nauseated x 47mth The symptoms worsen when active. He state the symptoms developed after he had a gout flare up x 44mthe ago. He also complains of weight loss. He reports that he lose 30lbs in last 2 weeks.He have these episodes that he gets very dizzy, tunnel vision and faint sensation comes over him.)  HPI Establish Care New Provider Pt last seen by PCP about 6 months ago.  Obtain records from [facility] for [place].    Gout flare Patient notes gout flare of his RIGHT knee 1 month ago and has had symptoms persistently since onset.  Gradually improving, but remains painful.   - Patient used crutches for over 1 month for knee RIGHT Knee gout.  Is improving.   - Patient notes he is outside often with work and taking care of his home.  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   - Gatorade 1/2 standard size today.  Water - "not much."  He reports drinking no liquids with meals.   - Alcohol intake high: Drinks a beer and/or whiskey (1/2 pint or a little more per day) at home in evenings  Dizzness Patient has history of vertigo.  Patient does have some dizziness similar to past vertigo flare.  Symptoms include blurred vision, nausea with activity, shortness of breath.  Hypertension Patient self-stopped lisinopril.  Continues on triamterene-HCTZ37.5-25 mg once daily, metoprolol tartrate 25 mg twice daily, isosorbide mononitrate 30 mg once daily.   - Pt denies headache, lightheadedness, dizziness, changes in vision, chest tightness/pressure, palpitations, leg swelling, sudden loss of speech or loss of consciousness.   - Patient notes history of MI, CAD.  Type 2 Diabetes Patient states he ran out of metformin about 2 months ago.  He has not been taking anything since that  time.  He is not checking fasting blood sugars at home. - He denies polydipsia, polyphagia, polyuria, headaches, diaphoresis, shakiness, chills, pain, numbness or tingling in extremities.   - Clinical course has been worsening. - He  reports no regular exercise routine. - His diet is moderate in salt, moderate in fat, and moderate in carbohydrates. - Weight trend: decreasing rapidly  PREVENTION: Eye exam current (within one year): no Foot exam current (within one year): no  Lipid/ASCVD risk reduction - on statin: no Kidney protection - on ace or arb: no - patient self-discontinued due to fear of angioedema. Recent Labs    07/21/18 1034 07/17/19 1133  HGBA1C 7.0* 7.3*   Past Medical History:  Diagnosis Date   Chest pain    a. 04/2016 MV: small, mild apical defect c/w apical ischemia, EF 55-65%-->Low risk study.   Diabetes mellitus without complication ([place])    Gout    Hematochezia    History of echocardiogram    a. 04/2016 Echo: EF 55-60%, mild LVH, no rwma, midly dil LA.   History of heart attack    Hypertension    Morbid obesity ([place])    Past Surgical History:  Procedure Laterality Date   COLONOSCOPY WITH PROPOFOL N/A 08/05/2018   Procedure: COLONOSCOPY WITH PROPOFOL;  Surgeon: Lin Landsman, MD;  Location[facility] ENDOSCOPY;  Service: Gastroenterology;  Laterality: N/A;   thumb surgery     Social History   Socioeconomic History  Marital status: Divorced    Spouse name: Not on file   Number of children: 5   Years of education: Not on file   Highest education level: Not on file  Occupational History   Not on file  Social Needs   Financial resource strain: Not on file   Food insecurity    Worry: Not on file    Inability: Not on file   Transportation needs    Medical: Not on file    Non-medical: Not on file  Tobacco Use   Smoking status: Former Smoker    Packs/day: 0.25    Years: 25.00    Pack years: 6.25    Types: Cigarettes    Quit  date: 26    Years since quitting: 26.6   Smokeless tobacco: Never Used  Substance and Sexual Activity   Alcohol use: Yes    Alcohol/week: 40.0 standard drinks    Types: 10 Cans of beer, 30 Shots of liquor per week    Comment: everyday. whiskey nightly & beer   Drug use: No   Sexual activity: Not on file  Lifestyle   Physical activity    Days per week: Not on file    Minutes per session: Not on file   Stress: Not on file  Relationships   Social connections    Talks on phone: Not on file    Gets together: Not on file    Attends religious service: Not on file    Active member of club or organization: Not on file    Attends meetings of clubs or organizations: Not on file    Relationship status: Not on file   Intimate partner violence    Fear of current or ex partner: Not on file    Emotionally abused: Not on file    Physically abused: Not on file    Forced sexual activity: Not on file  Other Topics Concern   Not on file  Social History Narrative   Not on file   Family History  Problem Relation Age of Onset   Heart Problems Mother    Breast cancer Mother    Mental illness Father    Heart disease Father    Stroke Father    Diabetes Father    Cancer Father    Alcohol abuse Father    Bipolar disorder Father    Heart disease Other    Hypertension Other    Diabetes Other    Current Outpatient Medications on File Prior to Visit  Medication Sig   aspirin EC 81 MG tablet Take by mouth.   isosorbide mononitrate (IMDUR) 30 MG 24 hr tablet TAKE 1 TABLET(30 MG) BY MOUTH DAILY   metoprolol tartrate (LOPRESSOR) 25 MG tablet Take 1 tablet (25 mg total) by mouth 2 (two) times daily.   triamterene-hydrochlorothiazide (MAXZIDE-25) 37.5-25 MG tablet Take 1 tablet by mouth daily.   allopurinol (ZYLOPRIM) 100 MG tablet Take 1 tablet (100 mg total) by mouth 2 (two) times daily for 30 days.   amLODipine (NORVASC) 5 MG tablet Take 1 tablet (5 mg total) by mouth  daily. (Patient not taking: Reported on 07/17/2019)   colchicine 0.6 MG tablet Take 1 tablet (0.6 mg total) by mouth 2 (two) times daily for 7 days. To be only taken for acute gout flare   ibuprofen (ADVIL,MOTRIN) 800 MG tablet Take 1 tablet (800 mg total) by mouth every 8 (eight) hours as needed. (Patient not taking: Reported on 07/17/2019)   metFORMIN (GLUCOPHAGE) 500  MG tablet Take 1 tablet (500 mg total) by mouth 2 (two) times daily with a meal. (Patient not taking: Reported on 07/17/2019)   oxyCODONE-acetaminophen (PERCOCET) 7.5-325 MG tablet Take 1 tablet by mouth every 4 (four) hours as needed for severe pain. (Patient not taking: Reported on 07/17/2019)   predniSONE (DELTASONE) 50 MG tablet Take 1 tablet by mouth daily (Patient not taking: Reported on 07/17/2019)   predniSONE (STERAPRED UNI-PAK 21 TAB) 10 MG (21) TBPK tablet Take 6 tablets the first day, take 5 tablets the second day, take 4 tablets the third day, take 3 tablets the fourth day, take 2 tablets the fifth day, take 1 tablet the sixth day. (Patient not taking: Reported on 07/17/2019)   [DISCONTINUED] lisinopril (ZESTRIL) 20 MG tablet TAKE 1 TABLET(20 MG) BY MOUTH DAILY   No current facility-administered medications on file prior to visit.     Review of Systems Per HPI unless specifically indicated above     Objective:    BP 111/72 (BP Location: Left Arm, Patient Position: Sitting, Cuff Size: Large)    Pulse 72    Ht 5\' 10"  (1.778 m)    Wt 236 lb (107 kg)    BMI 33.86 kg/m   Wt Readings from Last 3 Encounters:  07/17/19 236 lb (107 kg)  12/08/18 260 lb 12.8 oz (118.3 kg)  11/06/18 260 lb (117.9 kg)    Physical Exam Vitals signs reviewed.  Constitutional:      General: He is awake. He is not in acute distress.    Appearance: He is well-developed.  HENT:     Head: Normocephalic and atraumatic.  Neck:     Musculoskeletal: Normal range of motion and neck supple.     Vascular: No carotid bruit.  Cardiovascular:      Rate and Rhythm: Normal rate and regular rhythm.     Pulses:          Radial pulses are 2+ on the right side and 2+ on the left side.       Posterior tibial pulses are 1+ on the right side and 1+ on the left side.     Heart sounds: Normal heart sounds, S1 normal and S2 normal.  Pulmonary:     Effort: Pulmonary effort is normal. No respiratory distress.     Breath sounds: Normal breath sounds and air entry.  Musculoskeletal:     Right knee: He exhibits effusion. Tenderness found. Medial joint line tenderness noted.  Skin:    General: Skin is warm and dry.     Capillary Refill: Capillary refill takes less than 2 seconds.     Comments: Darkened rash RIGHT medial knee.  Flaking and revealing pink tissue below epidermis.  100% granulation tissue, not draining.  Neurological:     General: No focal deficit present.     Mental Status: He is alert and oriented to person, place, and time. Mental status is at baseline.  Psychiatric:        Attention and Perception: Attention normal.        Mood and Affect: Mood and affect normal.        Behavior: Behavior normal. Behavior is cooperative.        Thought Content: Thought content normal.        Judgment: Judgment normal.    Results for orders placed or performed in visit on 07/17/19  COMPLETE METABOLIC PANEL WITH GFR  Result Value Ref Range   Glucose, Bld 120 (H) 65 - 99 mg/dL  BUN 42 (H) 7 - 25 mg/dL   Creat 2.34 (H) 0.70 - 1.25 mg/dL   GFR, Est Non African American 29 (L) > OR = 60 mL/min/1.47m2   GFR, Est African American 34 (L) > OR = 60 mL/min/1.17m2   BUN/Creatinine Ratio 18 6 - 22 (calc)   Sodium 136 135 - 146 mmol/L   Potassium 5.2 3.5 - 5.3 mmol/L   Chloride 107 98 - 110 mmol/L   CO2 22 20 - 32 mmol/L   Calcium 9.5 8.6 - 10.3 mg/dL   Total Protein 6.6 6.1 - 8.1 g/dL   Albumin 3.7 3.6 - 5.1 g/dL   Globulin 2.9 1.9 - 3.7 g/dL (calc)   AG Ratio 1.3 1.0 - 2.5 (calc)   Total Bilirubin 0.3 0.2 - 1.2 mg/dL   Alkaline phosphatase  (APISO) 94 35 - 144 U/L   AST 22 10 - 35 U/L   ALT 17 9 - 46 U/L  Uric acid  Result Value Ref Range   Uric Acid, Serum 9.6 (H) 4.0 - 8.0 mg/dL  Hemoglobin A1c  Result Value Ref Range   Hgb A1c MFr Bld 7.3 (H) <5.7 % of total Hgb   Mean Plasma Glucose 163 (calc)   eAG (mmol/L) 9.0 (calc)  Lipid panel  Result Value Ref Range   Cholesterol 246 (H) <200 mg/dL   HDL 29 (L) > OR = 40 mg/dL   Triglycerides 560 (H) <150 mg/dL   LDL Cholesterol (Calc)  mg/dL (calc)   Total CHOL/HDL Ratio 8.5 (H) <5.0 (calc)   Non-HDL Cholesterol (Calc) 217 (H) <130 mg/dL (calc)      Assessment & Plan:   Problem List Items Addressed This Visit      Endocrine   Diabetes (HCC)   Relevant Medications   aspirin EC 81 MG tablet   metFORMIN (GLUCOPHAGE) 500 MG tablet   Other Relevant Orders   Hemoglobin A1c (Completed)   Lipid panel (Completed)     Other   Gout   Relevant Medications   colchicine 0.6 MG tablet   Other Relevant Orders   COMPLETE METABOLIC PANEL WITH GFR (Completed)   Uric acid (Completed)    Other Visit Diagnoses    Dizziness    -  Primary   Relevant Medications   meclizine (ANTIVERT) 12.5 MG tablet   Dehydration       Relevant Orders   COMPLETE METABOLIC PANEL WITH GFR (Completed)   Encounter to establish care       Previous PCP was at [place].  Records are reviewed today in clinic.  Past medical, family, and surgical history reviewed w/ pt.   Dizziness / Dehydration:  Dizziness is likely caused by patient's dehydration.  Dehydration from multiple sources includes sweating and alcohol.  Patient may also have heat exhaustion as cause of other associated symptoms. - Encouraged patient to drink adequate oral intake of 6-8 bottles of water per day.   - Reduce alcohol intake - Labs today   Gout  Patient with chronic gout, now slowly resolving acute flare in RIGHT knee.  Concern for gouty arthritis with persistently tender medial aspect of knee.   - Check labs  today - Consider dose increase of allopurinol - Provided colchicine for acute flare of gout. - no refills provided since patient has taken too much in past. - Consider orthopedic referral prn - Encouraged patient to stop using topical ointments under wrap.  Wait 10 minutes before applying the wrap.  Appearance of first degree chemical  burn to skin is present today.   Diabetes Uncontrolled diabetes off metformin with/ A1c elevated > 7.0%.  Weight loss significant for 30 lbs in last 6 months.  Cannot exclude DM as cause.  Likely improvement in DM 2/2 weight loss or A1c could be higher than 7.3% off meds. - Resume metformin - Labs today - Follow-up 3 months.     Meds ordered this encounter  Medications   meclizine (ANTIVERT) 12.5 MG tablet    Sig: Take 1 tablet (12.5 mg total) by mouth 3 (three) times daily as needed for dizziness.    Dispense:  30 tablet    Refill:  0    Order Specific Question:   Supervising Provider    Answer:   Olin Hauser [[id number]]   colchicine 0.6 MG tablet    Sig: Take 1 tablet (0.6 mg total) by mouth 2 (two) times daily for 7 days. To be only taken for acute gout flare    Dispense:  14 tablet    Refill:  0    Fill Colcrys if cost is lower    Order Specific Question:   Supervising Provider    Answer:   Olin Hauser [[id number]]   metFORMIN (GLUCOPHAGE) 500 MG tablet    Sig: Take 1 tablet (500 mg total) by mouth 2 (two) times daily with a meal.    Dispense:  180 tablet    Refill:  1    Order Specific Question:   Supervising Provider    Answer:   Olin Hauser [[id number]]   Follow up plan: Return in about 3 months (around 10/17/2019) for diabetes, hypertension.  [name], DNP, AGPCNP-BC Adult Gerontology Primary Care Nurse Practitioner [facility] 07/17/2019, 10:39 AM

## 2019-07-18 LAB — COMPLETE METABOLIC PANEL WITH GFR
AG Ratio: 1.3 (calc) (ref 1.0–2.5)
ALT: 17 U/L (ref 9–46)
AST: 22 U/L (ref 10–35)
Albumin: 3.7 g/dL (ref 3.6–5.1)
Alkaline phosphatase (APISO): 94 U/L (ref 35–144)
BUN/Creatinine Ratio: 18 (calc) (ref 6–22)
BUN: 42 mg/dL — ABNORMAL HIGH (ref 7–25)
CO2: 22 mmol/L (ref 20–32)
Calcium: 9.5 mg/dL (ref 8.6–10.3)
Chloride: 107 mmol/L (ref 98–110)
Creat: 2.34 mg/dL — ABNORMAL HIGH (ref 0.70–1.25)
GFR, Est African American: 34 mL/min/{1.73_m2} — ABNORMAL LOW (ref >=60)
GFR, Est Non African American: 29 mL/min/{1.73_m2} — ABNORMAL LOW (ref >=60)
Globulin: 2.9 g/dL (calc) (ref 1.9–3.7)
Glucose, Bld: 120 mg/dL — ABNORMAL HIGH (ref 65–99)
Potassium: 5.2 mmol/L (ref 3.5–5.3)
Sodium: 136 mmol/L (ref 135–146)
Total Bilirubin: 0.3 mg/dL (ref 0.2–1.2)
Total Protein: 6.6 g/dL (ref 6.1–8.1)

## 2019-07-18 LAB — LIPID PANEL
Cholesterol: 246 mg/dL — ABNORMAL HIGH (ref <200)
HDL: 29 mg/dL — ABNORMAL LOW (ref >=40)
Non-HDL Cholesterol (Calc): 217 mg/dL (calc) — ABNORMAL HIGH (ref <130)
Total CHOL/HDL Ratio: 8.5 (calc) — ABNORMAL HIGH (ref <5.0)
Triglycerides: 560 mg/dL — ABNORMAL HIGH (ref <150)

## 2019-07-18 LAB — HEMOGLOBIN A1C
Hgb A1c MFr Bld: 7.3 % of total Hgb — ABNORMAL HIGH (ref <5.7)
Mean Plasma Glucose: 163 (calc)
eAG (mmol/L): 9 (calc)

## 2019-07-18 LAB — URIC ACID: Uric Acid, Serum: 9.6 mg/dL — ABNORMAL HIGH (ref 4.0–8.0)

## 2019-07-20 ENCOUNTER — Other Ambulatory Visit: Payer: Self-pay | Admitting: Nurse Practitioner

## 2019-07-20 ENCOUNTER — Encounter: Payer: Self-pay | Admitting: Nurse Practitioner

## 2019-07-20 DIAGNOSIS — M109 Gout, unspecified: Secondary | ICD-10-CM

## 2019-07-20 DIAGNOSIS — N289 Disorder of kidney and ureter, unspecified: Secondary | ICD-10-CM

## 2019-07-20 DIAGNOSIS — E1169 Type 2 diabetes mellitus with other specified complication: Secondary | ICD-10-CM

## 2019-07-20 MED ORDER — ALLOPURINOL 100 MG PO TABS: 200.0000 mg | ORAL_TABLET | Freq: Two times a day (BID) | ORAL | 1 refills | Status: DC

## 2019-07-20 MED ORDER — ROSUVASTATIN CALCIUM 10 MG PO TABS: 10.0000 mg | ORAL_TABLET | Freq: Every day | ORAL | 3 refills | Status: DC

## 2019-09-02 ENCOUNTER — Telehealth: Payer: Self-pay | Admitting: Nurse Practitioner

## 2019-09-02 DIAGNOSIS — M1A09X0 Idiopathic chronic gout, multiple sites, without tophus (tophi): Secondary | ICD-10-CM

## 2019-09-02 MED ORDER — COLCHICINE 0.6 MG PO TABS: 0.6000 mg | ORAL_TABLET | Freq: Two times a day (BID) | ORAL | 0 refills | Status: DC

## 2019-09-02 NOTE — Telephone Encounter (Signed)
Pt called said that he was having a gout flare up wanted to know if you would call him something in

## 2019-10-26 ENCOUNTER — Other Ambulatory Visit: Payer: Self-pay | Admitting: Nurse Practitioner

## 2019-10-26 DIAGNOSIS — M109 Gout, unspecified: Secondary | ICD-10-CM

## 2019-10-26 NOTE — Telephone Encounter (Signed)
Pt called requesting refill on gout medication

## 2019-10-26 NOTE — Telephone Encounter (Signed)
Can you clarify which medicine he needs?  I see Allopurinol 100mg  - he was taking 2 pills twice a day. He was given 180 pills on 07/20/19 by Ander Purpura. However at 4 pills a day - I am not sure where 180 pills comes from. He should have 120 pills per month or 360 pills for 3 month supply.  Also he has Colchicine for as needed for gout flares.  Let me now which med and which dosing / pill count he needs if you can check with him first.  Thank you  Nobie Putnam, [facility] 10/26/2019, 5:21 PM

## 2019-10-27 NOTE — Telephone Encounter (Signed)
Attempted to contact the patient, no answer. LMOM to return my call.

## 2019-10-28 MED ORDER — PREDNISONE 20 MG PO TABS: ORAL_TABLET | ORAL | 0 refills | Status: DC

## 2019-10-28 NOTE — Telephone Encounter (Signed)
The pt was notified. No questions or concerns. 

## 2019-10-28 NOTE — Telephone Encounter (Signed)
The pt was notified of your recommendation.  He wanted me to ask you about Indomethacin. He said he took this medication in the past, but it was changed to Colchicine.

## 2019-10-28 NOTE — Telephone Encounter (Signed)
Ok. I sent Prednisone to his pharmacy. Technically he should have scheduled a virtual visit to discuss gout. As this is for an acute flare only. This is not a refill of a chronic medicine.  If he needs any other advice or treatment he should schedule.  Nobie Putnam, DO [facility] 10/28/2019, 2:58 PM

## 2019-10-28 NOTE — Telephone Encounter (Signed)
The pt is requesting that you send the prednisone over to his pharmacy.

## 2019-10-28 NOTE — Telephone Encounter (Signed)
Correct. If he is taking Allopurinol PRN then it is not effective for preventing gout. He should probably not take it or save money on that medicine then.  For gout flare medicine. Colchicine will be very expensive even with goodrx.  Based on his chart it looks like he has reduced kidney function.  I would be very cautious about using any NSAID (Ibuprofen, Advil, Aleve) because they can harm his kidney function.  But those are the only other medicine that is cost effective for acute gout flares.  He may try Aleve 250mg  OTC - x 2 pills at once for 500mg  twice a day for 3-5 days MAXIMUM for gout flare. If he has to take this more often than once a month then that is probably too much.  Nobie Putnam, DO [facility] 10/28/2019, 12:47 PM

## 2019-10-28 NOTE — Telephone Encounter (Signed)
Indomethicin is actually an old NSAID as well, same family as Ibupofen, Aleve. It is a generic one that is usually rx only, not OTC.  It can be very harmful to kidney as well, so I am cautious with this one.  Would prefer the lower dose of Aleve he can get OTC. He can follow up if gout worse or we can consider a prednisone steroid if acute flare.  Nobie Putnam, [facility] 10/28/2019, 1:31 PM

## 2019-10-28 NOTE — Telephone Encounter (Signed)
I spoke with the patient and he is actually requesting for a new  Prescription that he can take for acute gout flare up. He was taking the Colchicine/ Colcrys but is out of the medication. He is non insured and complains the medication is to expensive. He is requesting that you prescribe something cheaper that will still work as well. I also contacted [facility] and they informed me that the patient last picked up his Allopurinol in March 2020 for qty 60. The patient admits that he takes it PRN, but state he have plenty Allopurinol. I informed the patient that the Allopurinol helps with preventing gout flare up and the importance of taking it as it was prescribed. He said it is expensive and he try to cut cost by stretching the prescription. I gave the patient information about Goodrx and informed him that he can get the Allopurinol cheaper.

## 2020-03-04 ENCOUNTER — Telehealth: Payer: Self-pay | Admitting: Nurse Practitioner

## 2020-03-04 NOTE — Telephone Encounter (Signed)
Pt ins is [facility] ( which he is out of network) is requesting a refill on his gout medication  Called into  [address]

## 2020-03-07 ENCOUNTER — Encounter: Payer: Self-pay | Admitting: Emergency Medicine

## 2020-03-07 ENCOUNTER — Emergency Department
Admission: EM | Admit: 2020-03-07 | Discharge: 2020-03-07 | Disposition: A | Discharge status: Home / Self Care | Payer: BLUE CROSS/BLUE SHIELD | Attending: Emergency Medicine | Admitting: Emergency Medicine

## 2020-03-07 ENCOUNTER — Other Ambulatory Visit: Payer: Self-pay

## 2020-03-07 DIAGNOSIS — Z79899 Other long term (current) drug therapy: Secondary | ICD-10-CM | POA: Insufficient documentation

## 2020-03-07 DIAGNOSIS — Z7984 Long term (current) use of oral hypoglycemic drugs: Secondary | ICD-10-CM | POA: Diagnosis not present

## 2020-03-07 DIAGNOSIS — Z87891 Personal history of nicotine dependence: Secondary | ICD-10-CM | POA: Diagnosis not present

## 2020-03-07 DIAGNOSIS — M10021 Idiopathic gout, right elbow: Secondary | ICD-10-CM | POA: Diagnosis not present

## 2020-03-07 DIAGNOSIS — M10072 Idiopathic gout, left ankle and foot: Secondary | ICD-10-CM | POA: Insufficient documentation

## 2020-03-07 DIAGNOSIS — M25521 Pain in right elbow: Secondary | ICD-10-CM | POA: Diagnosis present

## 2020-03-07 DIAGNOSIS — Z7982 Long term (current) use of aspirin: Secondary | ICD-10-CM | POA: Diagnosis not present

## 2020-03-07 DIAGNOSIS — I10 Essential (primary) hypertension: Secondary | ICD-10-CM | POA: Insufficient documentation

## 2020-03-07 DIAGNOSIS — E119 Type 2 diabetes mellitus without complications: Secondary | ICD-10-CM | POA: Insufficient documentation

## 2020-03-07 DIAGNOSIS — M109 Gout, unspecified: Secondary | ICD-10-CM

## 2020-03-07 MED ORDER — COLCHICINE 0.6 MG PO TABS: 1.2000 mg | ORAL_TABLET | Freq: Once | ORAL | 0 refills | Status: DC

## 2020-03-07 MED ORDER — PREDNISONE 10 MG (21) PO TBPK: ORAL_TABLET | ORAL | 0 refills | Status: DC

## 2020-03-07 NOTE — ED Provider Notes (Signed)
[facility] Emergency Department Provider Note ____________________________________________  Time seen: Approximately 10:49 AM  I have reviewed the triage vital signs and the nursing notes.   HISTORY  Chief Complaint Arm Pain and Foot Pain    HPI Roger [name] is a 64 y.o. male who presents to the emergency department for evaluation and treatment of pain and swelling to left foot and right elbow.  He states that he is had gout for over 20 years and this pain is the same as his previous gout flares.  He is taking allopurinol.  He is out of his colchicine.  He has also been successfully treated with prednisone in the past.  He denies new injury or other symptoms of concern.  Past Medical History:  Diagnosis Date  . Chest pain    a. 04/2016 MV: small, mild apical defect c/w apical ischemia, EF 55-65%-->Low risk study.  . Diabetes mellitus without complication ([place])   . Gout   . Hematochezia   . History of echocardiogram    a. 04/2016 Echo: EF 55-60%, mild LVH, no rwma, midly dil LA.  Marland Kitchen History of heart attack   . Hypertension   . Morbid obesity [facility])     Patient Active Problem List   Diagnosis Date Noted  . Anemia 07/21/2018  . Fatigue due to excessive exertion 04/19/2018  . Vertigo 04/19/2018  . Varicose veins of left leg with edema 04/18/2018  . Hypersomnia 01/14/2017  . Obesity 10/12/2016  . Paresthesia of left lower extremity 09/11/2016  . Rectal fissure 09/11/2016  . Gout 05/18/2016  . Erectile dysfunction 05/18/2016  . Stomach discomfort 05/18/2016  . Diabetes ([place]) 05/18/2016  . History of MI (myocardial infarction) 04/16/2016  . Neuropathy 04/16/2016  . Knee pain 04/16/2016  . Essential hypertension 04/16/2016    Past Surgical History:  Procedure Laterality Date  . COLONOSCOPY WITH PROPOFOL N/A 08/05/2018   Procedure: COLONOSCOPY WITH PROPOFOL;  Surgeon: Lin Landsman, MD;  Location: [facility] ENDOSCOPY;  Service: Gastroenterology;   Laterality: N/A;  . thumb surgery      Prior to Admission medications   Medication Sig Start Date End Date Taking? Authorizing Provider  allopurinol (ZYLOPRIM) 100 MG tablet Take 2 tablets (200 mg total) by mouth 2 (two) times daily. 07/20/19 08/19/19  Mikey College, NP  aspirin EC 81 MG tablet Take by mouth.    [provider]  colchicine 0.6 MG tablet Take 2 tablets (1.2 mg total) by mouth once for 1 dose. If pain persists after 1 hour, take 1 additional pill. No more than 3 pills in 24 hours. 03/07/20 03/07/20  Saveah Bahar, Johnette Abraham B, FNP  isosorbide mononitrate (IMDUR) 30 MG 24 hr tablet TAKE 1 TABLET(30 MG) BY MOUTH DAILY 05/13/19   Leone Haven, MD  meclizine (ANTIVERT) 12.5 MG tablet Take 1 tablet (12.5 mg total) by mouth 3 (three) times daily as needed for dizziness. 07/17/19   Mikey College, NP  metFORMIN (GLUCOPHAGE) 500 MG tablet Take 1 tablet (500 mg total) by mouth 2 (two) times daily with a meal. 07/17/19   Mikey College, NP  metoprolol tartrate (LOPRESSOR) 25 MG tablet Take 1 tablet (25 mg total) by mouth 2 (two) times daily. 04/18/18   Leone Haven, MD  predniSONE (STERAPRED UNI-PAK 21 TAB) 10 MG (21) TBPK tablet Take 6 tablets on the first day and decrease by 1 tablet each day until finished. 03/07/20   Nasrin Lanzo, Johnette Abraham B, FNP  rosuvastatin (CRESTOR) 10 MG tablet  Take 1 tablet (10 mg total) by mouth daily. 07/20/19   Mikey College, NP  triamterene-hydrochlorothiazide (MAXZIDE-25) 37.5-25 MG tablet Take 1 tablet by mouth daily. 06/25/19 06/24/20  Earleen Newport, MD  lisinopril (ZESTRIL) 20 MG tablet TAKE 1 TABLET(20 MG) BY MOUTH DAILY 05/13/19 06/25/19  Leone Haven, MD    Allergies Albuterol and Mucinex [guaifenesin er]  Family History  Problem Relation Age of Onset  . Heart Problems Mother   . Breast cancer Mother   . Mental illness Father   . Heart disease Father   . Stroke Father   . Diabetes Father   . Cancer Father   .  Alcohol abuse Father   . Bipolar disorder Father   . Heart disease Other   . Hypertension Other   . Diabetes Other     Social History Social History   Tobacco Use  . Smoking status: Former Smoker    Packs/day: 0.25    Years: 25.00    Pack years: 6.25    Types: Cigarettes    Quit date: 1994    Years since quitting: 27.2  . Smokeless tobacco: Never Used  Substance Use Topics  . Alcohol use: Yes    Alcohol/week: 42.0 standard drinks    Types: 10 Cans of beer, 32 Shots of liquor per week    Comment: everyday. 3-5 servings whiskey nightly & beer  . Drug use: No    Review of Systems Constitutional: Negative for fever. Cardiovascular: Negative for chest pain. Respiratory: Negative for shortness of breath. Musculoskeletal: Positive for left foot and right elbow pain. Skin: Positive for increased warmth overlying the skin of the left foot and right elbow. Neurological: Negative for decrease in sensation  ____________________________________________   PHYSICAL EXAM:  VITAL SIGNS: ED Triage Vitals  Enc Vitals Group     BP 03/07/20 1018 (!) 176/103     Pulse Rate 03/07/20 1018 74     Resp 03/07/20 1018 20     Temp 03/07/20 1018 98.4 F (36.9 C)     Temp Source 03/07/20 1018 Oral     SpO2 03/07/20 1018 97 %     Weight 03/07/20 1008 240 lb (108.9 kg)     Height 03/07/20 1008 5\' 10"  (1.778 m)     Head Circumference --      Peak Flow --      Pain Score 03/07/20 1007 10     Pain Loc --      Pain Edu? --      Excl. in [place]? --     Constitutional: Alert and oriented. Well appearing and in no acute distress. Eyes: Conjunctivae are clear without discharge or drainage Head: Atraumatic Neck: Supple. Respiratory: No cough. Respirations are even and unlabored. Musculoskeletal: Tenderness to the dorsal and lateral aspect of the left foot.  Tenderness to the olecranon, the right elbow. Neurologic: Awake, alert, oriented x4.  Motor and sensory function is intact Skin:  Erythematous, warm skin overlying the dorsal lateral left foot and right elbow Psychiatric: Affect and behavior are appropriate.  ____________________________________________   LABS (all labs ordered are listed, but only abnormal results are displayed)  Labs Reviewed - No data to display ____________________________________________  RADIOLOGY  Not indicated  I, Sherrie George, personally viewed and evaluated these images (plain radiographs) as part of my medical decision making, as well as reviewing the written report by the radiologist.  No results found. ____________________________________________   PROCEDURES  Procedures  ____________________________________________   INITIAL IMPRESSION /  ASSESSMENT AND PLAN / ED COURSE  Roger [name] is a 64 y.o. who presents to the emergency department for treatment and evaluation of pain secondary to gout flare.  Symptoms and exam are consistent for gout.  Patient states that colchicine works well, but it is very expensive.  He also states that prednisone works.  He requested I sent prescriptions for colchicine and prednisone.  He will get the colchicine if he can afford it, otherwise he will get the prednisone.  Patient instructed to follow-up with his primary care provider.  He was also instructed to return to the emergency department for symptoms that change or worsen if unable schedule an appointment with primary care.  Medications - No data to display  Pertinent labs & imaging results that were available during my care of the patient were reviewed by me and considered in my medical decision making (see chart for details).   _________________________________________   FINAL CLINICAL IMPRESSION(S) / ED DIAGNOSES  Final diagnoses:  Acute gout of right elbow, unspecified cause  Acute gout of left foot, unspecified cause    ED Discharge Orders         Ordered    predniSONE (STERAPRED UNI-PAK 21 TAB) 10 MG (21) TBPK tablet      03/07/20 1104    colchicine 0.6 MG tablet   Once     03/07/20 1104           If controlled substance prescribed during this visit, 12 month history viewed on the [place] prior to issuing an initial prescription for Schedule II or III opiod.   Victorino Dike, FNP 03/07/20 1351    Duffy Bruce, MD 03/08/20 [phone]

## 2020-03-07 NOTE — ED Triage Notes (Signed)
Pt reports having a gout flare up in his left foot and right elbow. Hx of the same

## 2020-03-07 NOTE — ED Notes (Signed)
See triage note  Presents pain and some swelling to left foot and right elbow  Hx of gout and thinks it is a flare

## 2020-03-07 NOTE — Telephone Encounter (Signed)
I called and spoke with the patient and he informed me that he went to the ER for the gout flare up. His plan is to try and get a primary care physician at [facility].

## 2020-05-03 ENCOUNTER — Emergency Department: Payer: BLUE CROSS/BLUE SHIELD

## 2020-05-03 ENCOUNTER — Encounter: Payer: Self-pay | Admitting: Emergency Medicine

## 2020-05-03 ENCOUNTER — Emergency Department
Admission: EM | Admit: 2020-05-03 | Discharge: 2020-05-03 | Disposition: A | Discharge status: Home / Self Care | Payer: BLUE CROSS/BLUE SHIELD | Attending: Emergency Medicine | Admitting: Emergency Medicine

## 2020-05-03 ENCOUNTER — Other Ambulatory Visit: Payer: Self-pay

## 2020-05-03 DIAGNOSIS — Z87891 Personal history of nicotine dependence: Secondary | ICD-10-CM | POA: Insufficient documentation

## 2020-05-03 DIAGNOSIS — I10 Essential (primary) hypertension: Secondary | ICD-10-CM | POA: Diagnosis not present

## 2020-05-03 DIAGNOSIS — Z79899 Other long term (current) drug therapy: Secondary | ICD-10-CM | POA: Insufficient documentation

## 2020-05-03 DIAGNOSIS — M109 Gout, unspecified: Secondary | ICD-10-CM | POA: Diagnosis not present

## 2020-05-03 DIAGNOSIS — M25572 Pain in left ankle and joints of left foot: Secondary | ICD-10-CM | POA: Diagnosis present

## 2020-05-03 MED ORDER — OXYCODONE HCL 5 MG PO TABS: 5.0000 mg | ORAL_TABLET | Freq: Three times a day (TID) | ORAL | 0 refills | Status: AC | PRN

## 2020-05-03 MED ORDER — PREDNISONE 10 MG PO TABS: ORAL_TABLET | ORAL | 0 refills | Status: AC

## 2020-05-03 NOTE — ED Triage Notes (Addendum)
Patient to ER for c/o pain to left ankle/left lower leg for approx one week. Patient states she has h/o gout, that this pain feels same. Patient has swelling present to left lower leg/calf area down to left ankle and foot.

## 2020-05-03 NOTE — ED Triage Notes (Signed)
Taken to US.

## 2020-05-03 NOTE — Discharge Instructions (Addendum)
Do not drink alcohol.  Make a follow up with rheumatology so you do not keep having this happen. Take steroids as directed. Take oxycodone as break through pain. Do not DRIVE or work while on this. Return for fevers, worsening swelling, or your symptoms not getting better or any other concerns.  You should have kidney function rechecked with PCP in next 1 month.

## 2020-05-03 NOTE — ED Notes (Signed)
See triage note  Presents with pain and swelling to left lower leg and ankle  States he has a hx of gout  Thinks this is a flare  States he was climbing stairs thinks that what caused the flare

## 2020-05-03 NOTE — ED Provider Notes (Signed)
[facility] Emergency Department Provider Note  ____________________________________________   First MD Initiated Contact with Patient 05/03/20 1306     (approximate)  I have reviewed the triage vital signs and the nursing notes.   HISTORY  Chief Complaint Ankle Pain    HPI Roger Roger [name] is a 64 y.o. male with frequent gout who comes in with ankle pain.  Pt reports pain in the left ankle and left knee for a few days.  Roger [name] states it feels like prior gout.  Roger [name] is not complaint with his allopurinol.  Also reports taking some left over prednisone yesterday and today. Denies any new injury/fall/history of DVT. No sob, chest pain, fevers.  Pain is constant, days, worse with walking, better with wrapping ankle.  Does report a little bit of swelling noted to back and leg as well.          Past Medical History:  Diagnosis Date  . Chest pain    a. 04/2016 MV: small, mild apical defect c/w apical ischemia, EF 55-65%-->Low risk study.  . Diabetes mellitus without complication ([place])   . Gout   . Hematochezia   . History of echocardiogram    a. 04/2016 Echo: EF 55-60%, mild LVH, no rwma, midly dil LA.  Marland Kitchen History of heart attack   . Hypertension   . Morbid obesity [facility])     Patient Active Problem List   Diagnosis Date Noted  . Anemia 07/21/2018  . Fatigue due to excessive exertion 04/19/2018  . Vertigo 04/19/2018  . Varicose veins of left leg with edema 04/18/2018  . Hypersomnia 01/14/2017  . Obesity 10/12/2016  . Paresthesia of left lower extremity 09/11/2016  . Rectal fissure 09/11/2016  . Gout 05/18/2016  . Erectile dysfunction 05/18/2016  . Stomach discomfort 05/18/2016  . Diabetes ([place]) 05/18/2016  . History of MI (myocardial infarction) 04/16/2016  . Neuropathy 04/16/2016  . Knee pain 04/16/2016  . Essential hypertension 04/16/2016    Past Surgical History:  Procedure Laterality Date  . COLONOSCOPY WITH PROPOFOL N/A 08/05/2018   Procedure:  COLONOSCOPY WITH PROPOFOL;  Surgeon: Lin Landsman, MD;  Location: [facility] ENDOSCOPY;  Service: Gastroenterology;  Laterality: N/A;  . thumb surgery      Prior to Admission medications   Medication Sig Start Date End Date Taking? Authorizing Provider  allopurinol (ZYLOPRIM) 100 MG tablet Take 2 tablets (200 mg total) by mouth 2 (two) times daily. 07/20/19 08/19/19  Mikey College, NP  aspirin EC 81 MG tablet Take by mouth.    [provider]  colchicine 0.6 MG tablet Take 2 tablets (1.2 mg total) by mouth once for 1 dose. If pain persists after 1 hour, take 1 additional pill. No more than 3 pills in 24 hours. 03/07/20 03/07/20  Triplett, Johnette Abraham B, FNP  isosorbide mononitrate (IMDUR) 30 MG 24 hr tablet TAKE 1 TABLET(30 MG) BY MOUTH DAILY 05/13/19   Leone Haven, MD  meclizine (ANTIVERT) 12.5 MG tablet Take 1 tablet (12.5 mg total) by mouth 3 (three) times daily as needed for dizziness. 07/17/19   Mikey College, NP  metFORMIN (GLUCOPHAGE) 500 MG tablet Take 1 tablet (500 mg total) by mouth 2 (two) times daily with a meal. 07/17/19   Mikey College, NP  metoprolol tartrate (LOPRESSOR) 25 MG tablet Take 1 tablet (25 mg total) by mouth 2 (two) times daily. 04/18/18   Leone Haven, MD  rosuvastatin (CRESTOR) 10 MG tablet Take 1 tablet (10 mg total)  by mouth daily. 07/20/19   Mikey College, NP  triamterene-hydrochlorothiazide (MAXZIDE-25) 37.5-25 MG tablet Take 1 tablet by mouth daily. 06/25/19 06/24/20  Earleen Newport, MD  lisinopril (ZESTRIL) 20 MG tablet TAKE 1 TABLET(20 MG) BY MOUTH DAILY 05/13/19 06/25/19  Leone Haven, MD    Allergies Albuterol and Mucinex [guaifenesin er]  Family History  Problem Relation Age of Onset  . Heart Problems Mother   . Breast cancer Mother   . Mental illness Father   . Heart disease Father   . Stroke Father   . Diabetes Father   . Cancer Father   . Alcohol abuse Father   . Bipolar disorder Father   . Heart  disease Other   . Hypertension Other   . Diabetes Other     Social History Social History   Tobacco Use  . Smoking status: Former Smoker    Packs/day: 0.25    Years: 25.00    Pack years: 6.25    Types: Cigarettes    Quit date: 1994    Years since quitting: 27.4  . Smokeless tobacco: Never Used  Substance Use Topics  . Alcohol use: Yes    Alcohol/week: 42.0 standard drinks    Types: 10 Cans of beer, 32 Shots of liquor per week    Comment: everyday. 3-5 servings whiskey nightly & beer  . Drug use: No      Review of Systems Constitutional: No fever/chills Eyes: No visual changes. ENT: No sore throat. Cardiovascular: Denies chest pain. Respiratory: Denies shortness of breath. Gastrointestinal: No abdominal pain.  No nausea, no vomiting.  No diarrhea.  No constipation. Genitourinary: Negative for dysuria. Musculoskeletal: Negative for back pain. R ankle pain R knee pain  Skin: Negative for rash. Neurological: Negative for headaches, focal weakness or numbness. All other ROS negative ____________________________________________   PHYSICAL EXAM:  VITAL SIGNS: ED Triage Vitals  Enc Vitals Group     BP 05/03/20 1148 (!) 161/98     Pulse Rate 05/03/20 1148 80     Resp 05/03/20 1148 20     Temp 05/03/20 1148 98.7 F (37.1 C)     Temp Source 05/03/20 1148 Oral     SpO2 05/03/20 1148 99 %     Weight 05/03/20 1149 240 lb (108.9 kg)     Height 05/03/20 1149 5\' 10"  (1.778 m)     Head Circumference --      Peak Flow --      Pain Score 05/03/20 1149 10     Pain Loc --      Pain Edu? --      Excl. in [place]? --     Constitutional: Alert and oriented. Well appearing and in no acute distress. Eyes: Conjunctivae are normal. EOMI. Head: Atraumatic. Nose: No congestion/rhinnorhea. Mouth/Throat: Mucous membranes are moist.   Neck: No stridor. Trachea Midline. FROM Cardiovascular: Normal rate, regular rhythm. Grossly normal heart sounds.  Good peripheral  circulation. Respiratory: Normal respiratory effort.  No retractions. Lungs CTAB. Gastrointestinal: Soft and nontender. No distention. No abdominal bruits.  Musculoskeletal: 2+ distal pulse, varicose veins noted, slight swelling at the left ankle with slight warmth although no redness and still able to range joint. No warmth/redness at knee and still able to range it.  Neurologic:  Normal speech and language. No gross focal neurologic deficits are appreciated.  Skin:  Skin is warm, dry and intact. No rash noted. Psychiatric: Mood and affect are normal. Speech and behavior are normal. GU: Deferred  ____________________________________________  RADIOLOGY   Official radiology report(s): US Venous Img Lower Unilateral Left  Result Date: 05/03/2020 CLINICAL DATA:  Left lower extremity swelling EXAM: LEFT LOWER EXTREMITY VENOUS DOPPLER ULTRASOUND TECHNIQUE: Gray-scale sonography with compression, as well as color and duplex ultrasound, were performed to evaluate the deep venous system(s) from the level of the common femoral vein through the popliteal and proximal calf veins. COMPARISON:  None. FINDINGS: VENOUS Normal compressibility of the common femoral, superficial femoral, and popliteal veins, as well as the visualized calf veins. Visualized portions of profunda femoral vein and great saphenous vein unremarkable. No filling defects to suggest DVT on grayscale or color Doppler imaging. Doppler waveforms show normal direction of venous flow, normal respiratory plasticity and response to augmentation. Limited views of the contralateral common femoral vein are unremarkable. OTHER None. Limitations: none IMPRESSION: Negative. Electronically Signed   By: Rolm Baptise M.D.   On: 05/03/2020 12:47    ____________________________________________   PROCEDURES  Procedure(s) performed (including Critical Care):  Procedures   ____________________________________________   INITIAL IMPRESSION /  ASSESSMENT AND PLAN / ED COURSE  [name] was evaluated in Emergency Department on 05/03/2020 for the symptoms described in the history of present illness. Roger [name] was evaluated in the context of the global COVID-19 pandemic, which necessitated consideration that the patient might be at risk for infection with the SARS-CoV-2 virus that causes COVID-19. Institutional protocols and algorithms that pertain to the evaluation of patients at risk for COVID-19 are in a state of rapid change based on information released by regulatory bodies including the CDC and federal and state organizations. These policies and algorithms were followed during the patient's care in the ED.    Pt comes in with gout flare. DVT US ordered in triage negative. D/w Pt that last creatine check showed elevated CR and that it is more dangerous to use the cholchine and ibuprofen due to that.  Offered to recheck labs to see if CR is better or worse to consider these options but pt declined stating Roger [name] did not want labs done today if possible. D/w pt that Roger [name] should have recehck in one year regardless and Roger [name] is 9 months out but again pt declines.  No evidence of septic joint upon examination and this seems consistent with his prior gout flares.  Denies any mechanical falls to suggest trauma.  Patient is a 2+ distal pulse unlikely to be arterial occlusion.  Roger [name] has had liver function checked recently in the last year did not show any evidence of liver failure and again declines recheck today.  Discussed with patient we will give a course of steroid taper.  Explained how patient needs to take as prescribed not to take leftover steroids.  Also given a few oxycodone for breakthrough pain given unable to give ibuprofen or colchicine due to patient's last kidney function check and not wanting to recheck again today.  Patient's ankle was wrapped states Roger [name] still able to ambulate with assistance with crutches.  Patient feels comfortable going home at  this time and is able to pick up his medications today  Given patient rheumatology's number to call to make follow-up appointment.  Explained the importance of taking his allopurinol and getting follow-up to prevent flareups.  I discussed the provisional nature of ED diagnosis, the treatment so far, the ongoing plan of care, follow up appointments and return precautions with the patient and any family or support people present. They expressed understanding and agreed with the plan, discharged home.  ____________________________________________   FINAL CLINICAL IMPRESSION(S) / ED DIAGNOSES   Final diagnoses:  Acute gout of left ankle, unspecified cause      MEDICATIONS GIVEN DURING THIS VISIT:  Medications - No data to display   ED Discharge Orders         Ordered    predniSONE (DELTASONE) 10 MG tablet     05/03/20 1330    oxyCODONE (ROXICODONE) 5 MG immediate release tablet  Every 8 hours PRN     05/03/20 1330           Note:  This document was prepared using Dragon voice recognition software and may include unintentional dictation errors.   Vanessa Houston, MD 05/03/20 1352

## 2020-06-07 ENCOUNTER — Other Ambulatory Visit: Payer: Self-pay | Admitting: Family Medicine

## 2020-06-07 ENCOUNTER — Ambulatory Visit: Payer: Self-pay | Admitting: Family Medicine

## 2020-06-07 NOTE — Telephone Encounter (Signed)
Medication Refill - Medication: colchicine tablet 1.2 mg, predniSONE (DELTASONE) 10 MG tablet (Patient requesting refill due to gout flare up. Patient requesting  Callback once medication haas ben sent to pharmacy.)   Has the patient contacted their pharmacy?Yes (Agent: If no, request that the patient contact the pharmacy for the refill.) (Agent: If yes, when and what did the pharmacy advise?)Contact PCP  Preferred Pharmacy (with phone number or street name):  [id number] [address], [place] Phone:  [phone]  Fax:  [phone]       Agent: Please be advised that RX refills may take up to 3 business days. We ask that you follow-up with your pharmacy.

## 2020-07-03 ENCOUNTER — Other Ambulatory Visit: Payer: Self-pay

## 2020-07-03 ENCOUNTER — Encounter: Payer: Self-pay | Admitting: Emergency Medicine

## 2020-07-03 ENCOUNTER — Ambulatory Visit (INDEPENDENT_AMBULATORY_CARE_PROVIDER_SITE_OTHER): Payer: BLUE CROSS/BLUE SHIELD

## 2020-07-03 ENCOUNTER — Ambulatory Visit
Admission: EM | Admit: 2020-07-03 | Discharge: 2020-07-03 | Disposition: A | Discharge status: Home / Self Care | Payer: BLUE CROSS/BLUE SHIELD | Attending: Family Medicine | Admitting: Family Medicine

## 2020-07-03 DIAGNOSIS — E1169 Type 2 diabetes mellitus with other specified complication: Secondary | ICD-10-CM | POA: Diagnosis present

## 2020-07-03 DIAGNOSIS — E785 Hyperlipidemia, unspecified: Secondary | ICD-10-CM | POA: Insufficient documentation

## 2020-07-03 DIAGNOSIS — M109 Gout, unspecified: Secondary | ICD-10-CM

## 2020-07-03 DIAGNOSIS — E1165 Type 2 diabetes mellitus with hyperglycemia: Secondary | ICD-10-CM | POA: Insufficient documentation

## 2020-07-03 LAB — COMPREHENSIVE METABOLIC PANEL
ALT: 14 U/L (ref 0–44)
AST: 19 U/L (ref 15–41)
Albumin: 3.4 g/dL — ABNORMAL LOW (ref 3.5–5.0)
Alkaline Phosphatase: 101 U/L (ref 38–126)
Anion gap: 8 (ref 5–15)
BUN: 28 mg/dL — ABNORMAL HIGH (ref 8–23)
CO2: 27 mmol/L (ref 22–32)
Calcium: 9 mg/dL (ref 8.9–10.3)
Chloride: 104 mmol/L (ref 98–111)
Creatinine, Ser: 1.25 mg/dL — ABNORMAL HIGH (ref 0.61–1.24)
GFR calc Af Amer: >60 mL/min (ref >60)
GFR calc non Af Amer: >60 mL/min (ref >60)
Glucose, Bld: 129 mg/dL — ABNORMAL HIGH (ref 70–99)
Potassium: 4.2 mmol/L (ref 3.5–5.1)
Sodium: 139 mmol/L (ref 135–145)
Total Bilirubin: 1 mg/dL (ref 0.3–1.2)
Total Protein: 7.2 g/dL (ref 6.5–8.1)

## 2020-07-03 MED ORDER — METOPROLOL TARTRATE 25 MG PO TABS: 25.0000 mg | ORAL_TABLET | Freq: Two times a day (BID) | ORAL | 0 refills | Status: DC

## 2020-07-03 MED ORDER — ISOSORBIDE MONONITRATE ER 30 MG PO TB24: ORAL_TABLET | ORAL | 0 refills | Status: DC

## 2020-07-03 MED ORDER — PREDNISONE 10 MG PO TABS: ORAL_TABLET | ORAL | 0 refills | Status: DC

## 2020-07-03 MED ORDER — TRIAMTERENE-HCTZ 37.5-25 MG PO TABS: 1.0000 | ORAL_TABLET | Freq: Every day | ORAL | 0 refills | Status: DC

## 2020-07-03 MED ORDER — METFORMIN HCL 500 MG PO TABS: 500.0000 mg | ORAL_TABLET | Freq: Two times a day (BID) | ORAL | 0 refills | Status: DC

## 2020-07-03 MED ORDER — ROSUVASTATIN CALCIUM 10 MG PO TABS: 10.0000 mg | ORAL_TABLET | Freq: Every day | ORAL | 0 refills | Status: DC

## 2020-07-03 MED ORDER — ALLOPURINOL 100 MG PO TABS: 200.0000 mg | ORAL_TABLET | Freq: Two times a day (BID) | ORAL | 0 refills | Status: DC

## 2020-07-03 MED ORDER — ASPIRIN EC 81 MG PO TBEC: 81.0000 mg | DELAYED_RELEASE_TABLET | Freq: Every day | ORAL | 0 refills | Status: DC

## 2020-07-03 MED ORDER — COLCHICINE 0.6 MG PO TABS: 0.6000 mg | ORAL_TABLET | Freq: Two times a day (BID) | ORAL | 0 refills | Status: DC

## 2020-07-03 NOTE — ED Triage Notes (Signed)
Patient states that he has gout in his right big toe for the past week.  Patient states that he dropped a recliner on his right big toe and foot on Wed.  Patient c/o pain on top of his right foot and right big toe.

## 2020-07-03 NOTE — Discharge Instructions (Signed)
Start colchicine after prednisone.  See Jefm Bryant ASAP.  Take care  Dr. Lacinda Axon

## 2020-07-04 NOTE — ED Provider Notes (Signed)
[facility]    CSN: [id number] Arrival date & time: 07/03/20  1006      History   Chief Complaint Chief Complaint  Patient presents with  . Toe Pain    right big toe  . Foot Pain   HPI   64 year old male presents with the above complaint.  Patient reports that he has had ongoing pain of the right great toe over the past week.  He believes this is secondary to gout.  Additionally, he states that he has recently dropped a recliner on his foot on Wednesday.  He is concerned he may have fractured his foot and/or great toe.  Pain 10/10 in severity.  No relieving factors.  Patient is out of his gout medication as well as his other medication.  No relieving factors.  Worse with pressure/palpation and activity.  Past Medical History:  Diagnosis Date  . Chest pain    a. 04/2016 MV: small, mild apical defect c/w apical ischemia, EF 55-65%-->Low risk study.  . Diabetes mellitus without complication ([place])   . Gout   . Hematochezia   . History of echocardiogram    a. 04/2016 Echo: EF 55-60%, mild LVH, no rwma, midly dil LA.  Marland Kitchen History of heart attack   . Hypertension   . Morbid obesity [facility]     Patient Active Problem List   Diagnosis Date Noted  . Anemia 07/21/2018  . Fatigue due to excessive exertion 04/19/2018  . Vertigo 04/19/2018  . Varicose veins of left leg with edema 04/18/2018  . Hypersomnia 01/14/2017  . Obesity 10/12/2016  . Paresthesia of left lower extremity 09/11/2016  . Rectal fissure 09/11/2016  . Gout 05/18/2016  . Erectile dysfunction 05/18/2016  . Stomach discomfort 05/18/2016  . Diabetes ([place]) 05/18/2016  . History of MI (myocardial infarction) 04/16/2016  . Neuropathy 04/16/2016  . Knee pain 04/16/2016  . Essential hypertension 04/16/2016    Past Surgical History:  Procedure Laterality Date  . COLONOSCOPY WITH PROPOFOL N/A 08/05/2018   Procedure: COLONOSCOPY WITH PROPOFOL;  Surgeon: Lin Landsman, MD;  Location: [facility] ENDOSCOPY;   Service: Gastroenterology;  Laterality: N/A;  . thumb surgery         Home Medications    Prior to Admission medications   Medication Sig Start Date End Date Taking? Authorizing Provider  allopurinol (ZYLOPRIM) 100 MG tablet Take 2 tablets (200 mg total) by mouth 2 (two) times daily. 07/03/20 08/02/20  Coral Spikes, DO  aspirin EC 81 MG tablet Take 1 tablet (81 mg total) by mouth daily. 07/03/20   Coral Spikes, DO  colchicine 0.6 MG tablet Take 1 tablet (0.6 mg total) by mouth 2 (two) times daily. 07/03/20   Coral Spikes, DO  isosorbide mononitrate (IMDUR) 30 MG 24 hr tablet TAKE 1 TABLET(30 MG) BY MOUTH DAILY 07/03/20   Thersa Salt G, DO  metFORMIN (GLUCOPHAGE) 500 MG tablet Take 1 tablet (500 mg total) by mouth 2 (two) times daily with a meal. 07/03/20   Thersa Salt G, DO  metoprolol tartrate (LOPRESSOR) 25 MG tablet Take 1 tablet (25 mg total) by mouth 2 (two) times daily. 07/03/20   Coral Spikes, DO  predniSONE (DELTASONE) 10 MG tablet 50 mg daily x 3 days, then 40 mg daily x 3 days, then 30 mg daily x 3 days, then 20 mg daily x 3 days, then 10 mg daily x 3 days. 07/03/20   Coral Spikes, DO  rosuvastatin (CRESTOR) 10 MG tablet Take  1 tablet (10 mg total) by mouth daily. 07/03/20   Coral Spikes, DO  triamterene-hydrochlorothiazide (MAXZIDE-25) 37.5-25 MG tablet Take 1 tablet by mouth daily. 07/03/20   Coral Spikes, DO  lisinopril (ZESTRIL) 20 MG tablet TAKE 1 TABLET(20 MG) BY MOUTH DAILY 05/13/19 06/25/19  Leone Haven, MD    Family History Family History  Problem Relation Age of Onset  . Heart Problems Mother   . Breast cancer Mother   . Mental illness Father   . Heart disease Father   . Stroke Father   . Diabetes Father   . Cancer Father   . Alcohol abuse Father   . Bipolar disorder Father   . Heart disease Other   . Hypertension Other   . Diabetes Other     Social History Social History   Tobacco Use  . Smoking status: Former Smoker    Packs/day: 0.25    Years: 25.00     Pack years: 6.25    Types: Cigarettes    Quit date: 1994    Years since quitting: 27.6  . Smokeless tobacco: Never Used  Vaping Use  . Vaping Use: Never used  Substance Use Topics  . Alcohol use: Yes    Alcohol/week: 42.0 standard drinks    Types: 10 Cans of beer, 32 Shots of liquor per week    Comment: everyday. 3-5 servings whiskey nightly & beer  . Drug use: No     Allergies   Albuterol and Mucinex [guaifenesin er]   Review of Systems Review of Systems  Constitutional: Negative.   Musculoskeletal:       Right great toe pain and right foot pain.   Physical Exam Triage Vital Signs ED Triage Vitals  Enc Vitals Group     BP 07/03/20 1024 (!) 166/79     Pulse Rate 07/03/20 1024 84     Resp 07/03/20 1024 16     Temp 07/03/20 1024 98.3 F (36.8 C)     Temp Source 07/03/20 1024 Oral     SpO2 07/03/20 1024 95 %     Weight 07/03/20 1020 240 lb (108.9 kg)     Height 07/03/20 1020 5\' 10"  (1.778 m)     Head Circumference --      Peak Flow --      Pain Score 07/03/20 1020 10     Pain Loc --      Pain Edu? --      Excl. in [place]? --    Updated Vital Signs BP (!) 166/79 (BP Location: Left Arm) Comment: Patient states that he has been out of his BP medicines for several months  Pulse 84   Temp 98.3 F (36.8 C) (Oral)   Resp 16   Ht 5\' 10"  (1.778 m)   Wt 108.9 kg   SpO2 95%   BMI 34.44 kg/m   Visual Acuity Right Eye Distance:   Left Eye Distance:   Bilateral Distance:    Right Eye Near:   Left Eye Near:    Bilateral Near:     Physical Exam Constitutional:      General: He is not in acute distress.    Appearance: Normal appearance. He is obese. He is not ill-appearing.  HENT:     Head: Normocephalic and atraumatic.  Eyes:     General:        Right eye: No discharge.        Left eye: No discharge.     Conjunctiva/sclera: Conjunctivae  normal.  Cardiovascular:     Rate and Rhythm: Normal rate and regular rhythm.  Pulmonary:     Effort: Pulmonary effort is  normal.     Breath sounds: Normal breath sounds.  Musculoskeletal:     Comments: Right foot -exquisite tenderness of the MTP joint of the great toe.  Neurological:     Mental Status: He is alert.  Psychiatric:        Mood and Affect: Mood normal.        Behavior: Behavior normal.    UC Treatments / Results  Labs (all labs ordered are listed, but only abnormal results are displayed) Labs Reviewed  COMPREHENSIVE METABOLIC PANEL - Abnormal; Notable for the following components:      Result Value   Glucose, Bld 129 (*)    BUN 28 (*)    Creatinine, Ser 1.25 (*)    Albumin 3.4 (*)    All other components within normal limits    EKG   Radiology DG Foot Complete Right  Result Date: 07/03/2020 CLINICAL DATA:  Gout, dropped recliner on great toe, pain EXAM: RIGHT FOOT COMPLETE - 3+ VIEW COMPARISON:  None. FINDINGS: No fracture or dislocation of the right foot. There are periarticular erosions of the right first metatarsophalangeal joint with mild arthrosis. Arthrosis of the ankle mortise, not well imaged. Diffuse soft tissue edema of the forefoot. IMPRESSION: 1.  No fracture or dislocation of the right foot. 2. There are periarticular erosions of the right first metatarsophalangeal joint with mild arthrosis, in keeping with reported history of gout. Electronically Signed   By: Eddie Candle M.D.   On: 07/03/2020 10:54    Procedures Procedures (including critical care time)  Medications Ordered in UC Medications - No data to display  Initial Impression / Assessment and Plan / UC Course  I have reviewed the triage vital signs and the nursing notes.  Pertinent labs & imaging results that were available during my care of the patient were reviewed by me and considered in my medical decision making (see chart for details).    64 year old male presents with acute gout.  Chronic problem with acute exacerbation.  X-ray was obtained given recent reported injury.  X-ray was negative for  fracture.  Treating with prednisone.  Laboratories obtained today given prior abnormal creatinine.  Creatinine 1.25 currently.  After prednisone taper he is to start colchicine and allopurinol.  I have refilled his other medications as he is currently out of them.  Needs close follow-up with primary care physician.  He states that he will be establishing with Jefm Bryant soon.  Final Clinical Impressions(s) / UC Diagnoses   Final diagnoses:  Acute gout involving toe of right foot, unspecified cause     Discharge Instructions     Start colchicine after prednisone.  See Jefm Bryant ASAP.  Take care  Dr. Lacinda Axon    ED Prescriptions    Medication Sig Dispense Auth. Provider   allopurinol (ZYLOPRIM) 100 MG tablet Take 2 tablets (200 mg total) by mouth 2 (two) times daily. 120 tablet Thersa Salt G, DO   aspirin EC 81 MG tablet Take 1 tablet (81 mg total) by mouth daily. 30 tablet Sanai Frick G, DO   colchicine 0.6 MG tablet Take 1 tablet (0.6 mg total) by mouth 2 (two) times daily. 60 tablet Eura Mccauslin G, DO   isosorbide mononitrate (IMDUR) 30 MG 24 hr tablet TAKE 1 TABLET(30 MG) BY MOUTH DAILY 30 tablet Devlyn Retter G, DO   metFORMIN (GLUCOPHAGE)  500 MG tablet Take 1 tablet (500 mg total) by mouth 2 (two) times daily with a meal. 60 tablet Maxim Bedel G, DO   metoprolol tartrate (LOPRESSOR) 25 MG tablet Take 1 tablet (25 mg total) by mouth 2 (two) times daily. 30 tablet Marielouise Amey G, DO   rosuvastatin (CRESTOR) 10 MG tablet Take 1 tablet (10 mg total) by mouth daily. 30 tablet Kennedey Digilio G, DO   triamterene-hydrochlorothiazide (MAXZIDE-25) 37.5-25 MG tablet Take 1 tablet by mouth daily. 30 tablet Asra Gambrel G, DO   predniSONE (DELTASONE) 10 MG tablet 50 mg daily x 3 days, then 40 mg daily x 3 days, then 30 mg daily x 3 days, then 20 mg daily x 3 days, then 10 mg daily x 3 days. 45 tablet Coral Spikes, DO     PDMP not reviewed this encounter.   Thersa Salt [place], [place] 07/04/20 [phone]

## 2020-07-15 ENCOUNTER — Other Ambulatory Visit: Payer: Self-pay | Admitting: Family Medicine

## 2020-07-30 ENCOUNTER — Other Ambulatory Visit: Payer: Self-pay | Admitting: Family Medicine

## 2020-07-30 DIAGNOSIS — M109 Gout, unspecified: Secondary | ICD-10-CM

## 2020-07-30 DIAGNOSIS — E1169 Type 2 diabetes mellitus with other specified complication: Secondary | ICD-10-CM

## 2020-07-30 DIAGNOSIS — E1165 Type 2 diabetes mellitus with hyperglycemia: Secondary | ICD-10-CM

## 2020-11-24 ENCOUNTER — Encounter: Payer: Self-pay | Admitting: Emergency Medicine

## 2020-11-24 ENCOUNTER — Ambulatory Visit (INDEPENDENT_AMBULATORY_CARE_PROVIDER_SITE_OTHER): Payer: BLUE CROSS/BLUE SHIELD

## 2020-11-24 ENCOUNTER — Ambulatory Visit
Admission: EM | Admit: 2020-11-24 | Discharge: 2020-11-24 | Disposition: A | Discharge status: Home / Self Care | Payer: BLUE CROSS/BLUE SHIELD | Attending: Physician Assistant | Admitting: Physician Assistant

## 2020-11-24 ENCOUNTER — Other Ambulatory Visit: Payer: Self-pay

## 2020-11-24 DIAGNOSIS — M25561 Pain in right knee: Secondary | ICD-10-CM

## 2020-11-24 DIAGNOSIS — M25461 Effusion, right knee: Secondary | ICD-10-CM | POA: Diagnosis not present

## 2020-11-24 DIAGNOSIS — M109 Gout, unspecified: Secondary | ICD-10-CM | POA: Diagnosis not present

## 2020-11-24 MED ORDER — PREDNISONE 20 MG PO TABS: 40.0000 mg | ORAL_TABLET | Freq: Every day | ORAL | 0 refills | Status: AC

## 2020-11-24 NOTE — Discharge Instructions (Signed)
X-ray does not show any fractures.  There is some tendinitis.  Ice and elevate the knee.  This does appear to be consistent with a gout flareup.  I have sent prednisone to pharmacy for you.  You can take Tylenol 2 if you need to.  Monitor the redness and if it spreads or you develop a fever you need to be seen again immediately.
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
## 2021-03-28 ENCOUNTER — Other Ambulatory Visit: Payer: Self-pay | Admitting: Family Medicine

## 2021-04-01 ENCOUNTER — Emergency Department: Payer: BLUE CROSS/BLUE SHIELD

## 2021-04-01 ENCOUNTER — Other Ambulatory Visit: Payer: Self-pay

## 2021-04-01 ENCOUNTER — Emergency Department
Admission: EM | Admit: 2021-04-01 | Discharge: 2021-04-01 | Disposition: A | Discharge status: Home / Self Care | Payer: BLUE CROSS/BLUE SHIELD | Attending: Emergency Medicine | Admitting: Emergency Medicine

## 2021-04-01 DIAGNOSIS — I10 Essential (primary) hypertension: Secondary | ICD-10-CM | POA: Insufficient documentation

## 2021-04-01 DIAGNOSIS — M79672 Pain in left foot: Secondary | ICD-10-CM | POA: Diagnosis present

## 2021-04-01 DIAGNOSIS — M109 Gout, unspecified: Secondary | ICD-10-CM | POA: Diagnosis not present

## 2021-04-01 DIAGNOSIS — E1169 Type 2 diabetes mellitus with other specified complication: Secondary | ICD-10-CM | POA: Insufficient documentation

## 2021-04-01 DIAGNOSIS — R42 Dizziness and giddiness: Secondary | ICD-10-CM | POA: Insufficient documentation

## 2021-04-01 DIAGNOSIS — R531 Weakness: Secondary | ICD-10-CM | POA: Diagnosis not present

## 2021-04-01 DIAGNOSIS — R519 Headache, unspecified: Secondary | ICD-10-CM | POA: Diagnosis not present

## 2021-04-01 DIAGNOSIS — Z87891 Personal history of nicotine dependence: Secondary | ICD-10-CM | POA: Diagnosis not present

## 2021-04-01 DIAGNOSIS — Z79899 Other long term (current) drug therapy: Secondary | ICD-10-CM | POA: Diagnosis not present

## 2021-04-01 DIAGNOSIS — R5381 Other malaise: Secondary | ICD-10-CM | POA: Insufficient documentation

## 2021-04-01 DIAGNOSIS — R0602 Shortness of breath: Secondary | ICD-10-CM | POA: Insufficient documentation

## 2021-04-01 DIAGNOSIS — H538 Other visual disturbances: Secondary | ICD-10-CM | POA: Insufficient documentation

## 2021-04-01 DIAGNOSIS — Z7982 Long term (current) use of aspirin: Secondary | ICD-10-CM | POA: Insufficient documentation

## 2021-04-01 DIAGNOSIS — E1140 Type 2 diabetes mellitus with diabetic neuropathy, unspecified: Secondary | ICD-10-CM | POA: Insufficient documentation

## 2021-04-01 DIAGNOSIS — Z7984 Long term (current) use of oral hypoglycemic drugs: Secondary | ICD-10-CM | POA: Insufficient documentation

## 2021-04-01 LAB — URINALYSIS, COMPLETE (UACMP) WITH MICROSCOPIC
Bacteria, UA: NONE SEEN
Bilirubin Urine: NEGATIVE
Glucose, UA: NEGATIVE mg/dL
Hgb urine dipstick: NEGATIVE
Ketones, ur: 5 mg/dL — AB
Leukocytes,Ua: NEGATIVE
Nitrite: NEGATIVE
Protein, ur: 100 mg/dL — AB
Specific Gravity, Urine: 1.017 (ref 1.005–1.030)
pH: 5 (ref 5.0–8.0)

## 2021-04-01 LAB — BASIC METABOLIC PANEL
Anion gap: 17 — ABNORMAL HIGH (ref 5–15)
Anion gap: 9 (ref 5–15)
BUN: 27 mg/dL — ABNORMAL HIGH (ref 8–23)
BUN: 30 mg/dL — ABNORMAL HIGH (ref 8–23)
CO2: 17 mmol/L — ABNORMAL LOW (ref 22–32)
CO2: 24 mmol/L (ref 22–32)
Calcium: 9.5 mg/dL (ref 8.9–10.3)
Calcium: 8.9 mg/dL (ref 8.9–10.3)
Chloride: 98 mmol/L (ref 98–111)
Chloride: 102 mmol/L (ref 98–111)
Creatinine, Ser: 1.79 mg/dL — ABNORMAL HIGH (ref 0.61–1.24)
Creatinine, Ser: 2.22 mg/dL — ABNORMAL HIGH (ref 0.61–1.24)
GFR, Estimated: 42 mL/min — ABNORMAL LOW (ref >60)
GFR, Estimated: 33 mL/min — ABNORMAL LOW (ref >60)
Glucose, Bld: 121 mg/dL — ABNORMAL HIGH (ref 70–99)
Glucose, Bld: 94 mg/dL (ref 70–99)
Potassium: 3.6 mmol/L (ref 3.5–5.1)
Potassium: 3.7 mmol/L (ref 3.5–5.1)
Sodium: 132 mmol/L — ABNORMAL LOW (ref 135–145)
Sodium: 135 mmol/L (ref 135–145)

## 2021-04-01 LAB — HEPATIC FUNCTION PANEL
ALT: 14 U/L (ref 0–44)
AST: 27 U/L (ref 15–41)
Albumin: 3.7 g/dL (ref 3.5–5.0)
Alkaline Phosphatase: 94 U/L (ref 38–126)
Bilirubin, Direct: 0.2 mg/dL (ref 0.0–0.2)
Indirect Bilirubin: 1.1 mg/dL — ABNORMAL HIGH (ref 0.3–0.9)
Total Bilirubin: 1.3 mg/dL — ABNORMAL HIGH (ref 0.3–1.2)
Total Protein: 8.2 g/dL — ABNORMAL HIGH (ref 6.5–8.1)

## 2021-04-01 LAB — CBC
HCT: 37.2 % — ABNORMAL LOW (ref 39.0–52.0)
Hemoglobin: 12.6 g/dL — ABNORMAL LOW (ref 13.0–17.0)
MCH: 33.7 pg (ref 26.0–34.0)
MCHC: 33.9 g/dL (ref 30.0–36.0)
MCV: 99.5 fL (ref 80.0–100.0)
Platelets: 247 10*3/uL (ref 150–400)
RBC: 3.74 MIL/uL — ABNORMAL LOW (ref 4.22–5.81)
RDW: 12.7 % (ref 11.5–15.5)
WBC: 7.4 10*3/uL (ref 4.0–10.5)
nRBC: 0 % (ref 0.0–0.2)

## 2021-04-01 LAB — LACTIC ACID, PLASMA
Lactic Acid, Venous: 1.5 mmol/L (ref 0.5–1.9)
Lactic Acid, Venous: 1.3 mmol/L (ref 0.5–1.9)

## 2021-04-01 LAB — URIC ACID: Uric Acid, Serum: 10.8 mg/dL — ABNORMAL HIGH (ref 3.7–8.6)

## 2021-04-01 LAB — BETA-HYDROXYBUTYRIC ACID: Beta-Hydroxybutyric Acid: 1.43 mmol/L — ABNORMAL HIGH (ref 0.05–0.27)

## 2021-04-01 MED ORDER — SODIUM CHLORIDE 0.9 % IV BOLUS
1000.0000 mL | Freq: Once | INTRAVENOUS | Status: AC
  Administered 2021-04-01: 1000 mL via INTRAVENOUS

## 2021-04-01 NOTE — ED Notes (Signed)
Pt states has been having dizziness and generalized weakness for past week. NIH scale negative. Pt has round wound on middle toe of L foot, dosral surface, measuring approx 1cm by 1cm with pale wound bed. Pt in nad, provided blanket.

## 2021-04-01 NOTE — ED Notes (Signed)
New light green sent to lab at this time, lab called and aware sample sent

## 2021-04-01 NOTE — ED Notes (Signed)
Lab reported hemolyzed BMP.

## 2021-04-01 NOTE — ED Notes (Signed)
Called lab to check status on repeat BMP. Lab reports that they never received the blood. Lab to send someone for recollect.

## 2021-04-01 NOTE — ED Notes (Signed)
BMP redrew and sent to lab by this RN

## 2021-04-01 NOTE — ED Triage Notes (Signed)
Pt reports dizziness and blurred vision for the past week and a gout flare up in his feet for the last month.

## 2021-04-01 NOTE — ED Provider Notes (Signed)
[facility] Emergency Department Provider Note  ____________________________________________  Time seen: Approximately 1:02 PM  I have reviewed the triage vital signs and the nursing notes.   HISTORY  Chief Complaint Dizziness, Blurred Vision, and Gout    HPI Roger [name] is a 65 y.o. male who presents emergency department multiple complaints patient states that he has had generalized weakness some intermittent shortness of breath, couple days of headache, intermittent blurred vision times a week.  Over the past couple of days he has had problems with his left foot, specifically the middle toe of the left foot.  States that he has a history of gout and the pain feels similar.  Patient currently denies any headache, no chest pain, no URI symptoms with nasal congestion or sore throat.  Patient states that the blurred vision is intermittent and not current.  It was bilateral.  There is no associated unilateral weakness.  No difficulty formulating thoughts or words.  He is also complaining of some shortness of breath with no associated chest pain.  This is also intermittent in nature.  No abdominal complaints to include nausea, vomiting, diarrhea.  No urinary complaints.  Patient has a history of anemia, vertigo, gout, diabetes, hypertension.         Past Medical History:  Diagnosis Date  . Chest pain    a. 04/2016 MV: small, mild apical defect c/w apical ischemia, EF 55-65%-->Low risk study.  . Diabetes mellitus without complication ([place])   . Gout   . Hematochezia   . History of echocardiogram    a. 04/2016 Echo: EF 55-60%, mild LVH, no rwma, midly dil LA.  Marland Kitchen History of heart attack   . Hypertension   . Morbid obesity [facility])     Patient Active Problem List   Diagnosis Date Noted  . Anemia 07/21/2018  . Fatigue due to excessive exertion 04/19/2018  . Vertigo 04/19/2018  . Varicose veins of left leg with edema 04/18/2018  . Hypersomnia 01/14/2017  .  Obesity 10/12/2016  . Paresthesia of left lower extremity 09/11/2016  . Rectal fissure 09/11/2016  . Gout 05/18/2016  . Erectile dysfunction 05/18/2016  . Stomach discomfort 05/18/2016  . Diabetes ([place]) 05/18/2016  . History of MI (myocardial infarction) 04/16/2016  . Neuropathy 04/16/2016  . Knee pain 04/16/2016  . Essential hypertension 04/16/2016    Past Surgical History:  Procedure Laterality Date  . COLONOSCOPY WITH PROPOFOL N/A 08/05/2018   Procedure: COLONOSCOPY WITH PROPOFOL;  Surgeon: Lin Landsman, MD;  Location: [facility] ENDOSCOPY;  Service: Gastroenterology;  Laterality: N/A;  . thumb surgery      Prior to Admission medications   Medication Sig Start Date End Date Taking? Authorizing Provider  allopurinol (ZYLOPRIM) 100 MG tablet Take 2 tablets (200 mg total) by mouth 2 (two) times daily. 07/03/20 08/02/20  Coral Spikes, DO  aspirin EC 81 MG tablet Take 1 tablet (81 mg total) by mouth daily. 07/03/20   Coral Spikes, DO  colchicine 0.6 MG tablet Take 1 tablet (0.6 mg total) by mouth 2 (two) times daily. 07/03/20   Coral Spikes, DO  isosorbide mononitrate (IMDUR) 30 MG 24 hr tablet TAKE 1 TABLET(30 MG) BY MOUTH DAILY 07/03/20   Thersa Salt G, DO  metFORMIN (GLUCOPHAGE) 500 MG tablet Take 1 tablet (500 mg total) by mouth 2 (two) times daily with a meal. 07/03/20   Thersa Salt G, DO  metoprolol tartrate (LOPRESSOR) 25 MG tablet Take 1 tablet (25 mg total) by mouth  2 (two) times daily. 07/03/20   Coral Spikes, DO  rosuvastatin (CRESTOR) 10 MG tablet Take 1 tablet (10 mg total) by mouth daily. 07/03/20   Coral Spikes, DO  triamterene-hydrochlorothiazide (MAXZIDE-25) 37.5-25 MG tablet Take 1 tablet by mouth daily. 07/03/20   Coral Spikes, DO  lisinopril (ZESTRIL) 20 MG tablet TAKE 1 TABLET(20 MG) BY MOUTH DAILY 05/13/19 06/25/19  Leone Haven, MD    Allergies Albuterol and Mucinex [guaifenesin er]  Family History  Problem Relation Age of Onset  . Heart Problems Mother   . Breast  cancer Mother   . Mental illness Father   . Heart disease Father   . Stroke Father   . Diabetes Father   . Cancer Father   . Alcohol abuse Father   . Bipolar disorder Father   . Heart disease Other   . Hypertension Other   . Diabetes Other     Social History Social History   Tobacco Use  . Smoking status: Former Smoker    Packs/day: 0.25    Years: 25.00    Pack years: 6.25    Types: Cigarettes    Quit date: 1994    Years since quitting: 28.3  . Smokeless tobacco: Never Used  Vaping Use  . Vaping Use: Never used  Substance Use Topics  . Alcohol use: Yes    Alcohol/week: 42.0 standard drinks    Types: 10 Cans of beer, 32 Shots of liquor per week    Comment: everyday. 3-5 servings whiskey nightly & beer  . Drug use: No     Review of Systems  Constitutional: No fever/chills.  Positive for generalized weakness and malaise Eyes: Intermittent bilateral blurred vision.. No discharge ENT: No upper respiratory complaints. Cardiovascular: no chest pain. Respiratory: no cough. SOB. Gastrointestinal: No abdominal pain.  No nausea, no vomiting.  No diarrhea.  No constipation. Genitourinary: Negative for dysuria. No hematuria Musculoskeletal: Left foot pain Skin: Negative for rash, abrasions, lacerations, ecchymosis. Neurological: Intermittent nonspecific headache.  Denies focal weakness or numbness.  10 System ROS otherwise negative.  ____________________________________________   PHYSICAL EXAM:  VITAL SIGNS: ED Triage Vitals  Enc Vitals Group     BP 04/01/21 1030 100/75     Pulse Rate 04/01/21 1028 (!) 107     Resp 04/01/21 1028 18     Temp 04/01/21 1028 97.7 F (36.5 C)     Temp Source 04/01/21 1028 Oral     SpO2 04/01/21 1028 99 %     Weight 04/01/21 1028 240 lb (108.9 kg)     Height 04/01/21 1028 5\' 10"  (1.778 m)     Head Circumference --      Peak Flow --      Pain Score 04/01/21 1028 10     Pain Loc --      Pain Edu? --      Excl. in [place]? --       Constitutional: Alert and oriented. Well appearing and in no acute distress. Eyes: Conjunctivae are normal. PERRL. EOMI. Head: Atraumatic. ENT:      Ears:       Nose: No congestion/rhinnorhea.      Mouth/Throat: Mucous membranes are moist.  Neck: No stridor.  Neck is supple full range of motion Hematological/Lymphatic/Immunilogical: No cervical lymphadenopathy. Cardiovascular: Normal rate, regular rhythm. Normal S1 and S2.  Good peripheral circulation. Respiratory: Normal respiratory effort without tachypnea or retractions. Lungs CTAB. Good air entry to the bases with no decreased or absent breath  sounds. Gastrointestinal: Bowel sounds 4 quadrants. Soft and nontender to palpation. No guarding or rigidity. No palpable masses. No distention.  Musculoskeletal: Full range of motion to all extremities. No gross deformities appreciated.  Visualization of the left foot reveals superficial skin erosion over the dorsal aspect of the third digit.  This measures approximately 0.5 cm in diameter.  No surrounding erythema.  Area is tender to palpation.  Digit appears slightly edematous.  No visible abnormality about the foot itself.  Dorsalis pedis pulse appreciated.  Sensation intact all digits. Neurologic:  Normal speech and language. No gross focal neurologic deficits are appreciated.  Cranial nerves II through XII grossly intact.  Equal grip strength bilateral upper extremities.  Negative Romberg's and pronator drift. Skin:  Skin is warm, dry and intact. No rash noted. Psychiatric: Mood and affect are normal. Speech and behavior are normal. Patient exhibits appropriate insight and judgement.   ____________________________________________   LABS (all labs ordered are listed, but only abnormal results are displayed)  Labs Reviewed  BASIC METABOLIC PANEL - Abnormal; Notable for the following components:      Result Value   Sodium 132 (*)    CO2 17 (*)    Glucose, Bld 121 (*)    BUN 27 (*)     Creatinine, Ser 1.79 (*)    GFR, Estimated 42 (*)    Anion gap 17 (*)    All other components within normal limits  CBC - Abnormal; Notable for the following components:   RBC 3.74 (*)    Hemoglobin 12.6 (*)    HCT 37.2 (*)    All other components within normal limits  URINALYSIS, COMPLETE (UACMP) WITH MICROSCOPIC - Abnormal; Notable for the following components:   Color, Urine YELLOW (*)    APPearance CLOUDY (*)    Ketones, ur 5 (*)    Protein, ur 100 (*)    All other components within normal limits  BETA-HYDROXYBUTYRIC ACID - Abnormal; Notable for the following components:   Beta-Hydroxybutyric Acid 1.43 (*)    All other components within normal limits  HEPATIC FUNCTION PANEL - Abnormal; Notable for the following components:   Total Protein 8.2 (*)    Total Bilirubin 1.3 (*)    Indirect Bilirubin 1.1 (*)    All other components within normal limits  URIC ACID - Abnormal; Notable for the following components:   Uric Acid, Serum 10.8 (*)    All other components within normal limits  BASIC METABOLIC PANEL - Abnormal; Notable for the following components:   BUN 30 (*)    Creatinine, Ser 2.22 (*)    GFR, Estimated 33 (*)    All other components within normal limits  LACTIC ACID, PLASMA  LACTIC ACID, PLASMA  CBG MONITORING, ED   ____________________________________________  EKG   ____________________________________________  RADIOLOGY I personally viewed and evaluated these images as part of my medical decision making, as well as reviewing the written report by the radiologist.  ED Provider Interpretation: No evidence of cardiopulmonary disease on chest x-ray.  Findings of the foot reveal no evidence of osteomyelitis.  DG Chest 2 View  Result Date: 04/01/2021 CLINICAL DATA:  Weakness, shortness of breath EXAM: CHEST - 2 VIEW COMPARISON:  Chest radiograph dated 08/07/2017. FINDINGS: The heart size is enlarged. Both lungs are clear. The visualized skeletal structures are  unremarkable. IMPRESSION: No active cardiopulmonary disease.  Cardiomegaly. Electronically Signed   By: Zerita Boers M.D.   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 Hallux valgus angulation of the great toe with bunion formation. No conventional radiographic evidence of osteomyelitis. No retained radiopaque foreign body. No acute fracture or malalignment. IMPRESSION: 1. No acute abnormality. 2. 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 Electronically Signed   By: Jacqulynn Cadet M.D.   On: 04/01/2021 13:49    ____________________________________________    PROCEDURES  Procedure(s) performed:    Procedures    Medications  sodium chloride 0.9 % bolus 1,000 mL (0 mLs Intravenous Stopped 04/01/21 1515)  sodium chloride 0.9 % bolus 1,000 mL (0 mLs Intravenous Stopped 04/01/21 1842)     ____________________________________________   INITIAL IMPRESSION / ASSESSMENT AND PLAN / ED COURSE  Pertinent labs & imaging results that were available during my care of the patient were reviewed by me and considered in my medical decision making (see chart for details).  Review of the [place] CSRS was performed in accordance of the [place] prior to dispensing any controlled drugs.  Clinical Course as of 04/01/21 2015  Sat Apr 01, 2021  1548 Patient presented to the ED with vague symptoms of intermittent headache, intermittent blurred vision, intermittent dizziness, shortness of breath, malaise and weakness.  Given the patient's history there was concern for complication secondary to his diabetes.  He is also complaining of some foot pain which he believes was gout as he has had multiple gout flares and this was consistent with same.  Patient's labs returned with  glucose of 121 but elevated anion gap and elevated beta hydroxybutyrate acid.  At this time I will give the patient IV fluids, recheck a BMP and check a lactic.  I have a lower suspicion given normal glucose, as well as patient's presentation that he is truly in DKA based off of labs.  However we will redraw labs after patient has been hydrated with IV fluids.  Patient has a creatinine of 1.79 it appears that patient normal creatinine ranges between 1.5 and 2. [JC]    Clinical Course User Index [JC] Indica Marcott, Charline Bills, PA-C          Patient's diagnosis is consistent with gout, dizziness.  Patient presented to the emergency department with multiple vague complaints as well as left foot pain.  Patient states that he believes he has gout, has had multiple instances of gout and symptoms are consistent with same.  He did have a superficial foot ulcer without signs of infection on physical exam as well.  Patient had already been started on colchicine by [facility] clinic but he started the prescription yesterday.  Uric acid level was up consistent with gout.  No evidence of infection on physical exam or evidence of osteomyelitis on x-ray.  Given the patient's vague symptoms labs have been obtained.  Patient had an elevated anion gap as well as an elevated beta hydroxybutyrate acid.  Patient's glucose was 121 and previous records revealed that this was about typical for the patient.  Given this patient was hydrated, repeat labs which revealed improved gap patient continues to appear well.  Blood glucose is within the normal limits at this time.  Patient did have slightly elevated creatinine at 1.8 and 2.2 on labs.  I have advised the patient to have repeat labs with either primary care or urgent care or return to the  emergency department to ensure patient's kidney function stays within his typical limits.  Typically patient will have a creatinine tween 1.5 and 2.3.  Patient is agreeable with this plan.  He  already has a prescription for colchicine and will continue same.  Avoid NSAIDs.  Drink fluids.  Take Tylenol in addition to colchicine if needed for additional pain relief.  Can follow-up for labs in 2 to 3 days.  Return precautions are discussed with the patient at length.  Patient is given ED precautions to return to the ED for any worsening or new symptoms.     ____________________________________________  FINAL CLINICAL IMPRESSION(S) / ED DIAGNOSES  Final diagnoses:  Acute gout of left foot, unspecified cause  Dizziness      NEW MEDICATIONS STARTED DURING THIS VISIT:  ED Discharge Orders    None          This chart was dictated using voice recognition software/Dragon. Despite best efforts to proofread, errors can occur which can change the meaning. Any change was purely unintentional.    Darletta Moll, PA-C 04/01/21 2015    Duffy Bruce, MD 04/02/21 1550

## 2021-04-04 ENCOUNTER — Emergency Department
Admission: EM | Admit: 2021-04-04 | Discharge: 2021-04-04 | Disposition: A | Discharge status: Home / Self Care | Payer: BLUE CROSS/BLUE SHIELD | Attending: Emergency Medicine | Admitting: Emergency Medicine

## 2021-04-04 ENCOUNTER — Other Ambulatory Visit: Payer: Self-pay

## 2021-04-04 ENCOUNTER — Ambulatory Visit: Payer: Self-pay | Admitting: Podiatry

## 2021-04-04 DIAGNOSIS — Z7982 Long term (current) use of aspirin: Secondary | ICD-10-CM | POA: Diagnosis not present

## 2021-04-04 DIAGNOSIS — I129 Hypertensive chronic kidney disease with stage 1 through stage 4 chronic kidney disease, or unspecified chronic kidney disease: Secondary | ICD-10-CM | POA: Insufficient documentation

## 2021-04-04 DIAGNOSIS — N189 Chronic kidney disease, unspecified: Secondary | ICD-10-CM | POA: Diagnosis not present

## 2021-04-04 DIAGNOSIS — E1122 Type 2 diabetes mellitus with diabetic chronic kidney disease: Secondary | ICD-10-CM | POA: Diagnosis not present

## 2021-04-04 DIAGNOSIS — Z7984 Long term (current) use of oral hypoglycemic drugs: Secondary | ICD-10-CM | POA: Diagnosis not present

## 2021-04-04 DIAGNOSIS — Z87891 Personal history of nicotine dependence: Secondary | ICD-10-CM | POA: Insufficient documentation

## 2021-04-04 DIAGNOSIS — Z79899 Other long term (current) drug therapy: Secondary | ICD-10-CM | POA: Diagnosis not present

## 2021-04-04 DIAGNOSIS — R7989 Other specified abnormal findings of blood chemistry: Secondary | ICD-10-CM | POA: Diagnosis present

## 2021-04-04 LAB — BASIC METABOLIC PANEL
Anion gap: 9 (ref 5–15)
BUN: 48 mg/dL — ABNORMAL HIGH (ref 8–23)
CO2: 24 mmol/L (ref 22–32)
Calcium: 10.2 mg/dL (ref 8.9–10.3)
Chloride: 99 mmol/L (ref 98–111)
Creatinine, Ser: 1.98 mg/dL — ABNORMAL HIGH (ref 0.61–1.24)
GFR, Estimated: 37 mL/min — ABNORMAL LOW (ref >60)
Glucose, Bld: 94 mg/dL (ref 70–99)
Potassium: 4.8 mmol/L (ref 3.5–5.1)
Sodium: 132 mmol/L — ABNORMAL LOW (ref 135–145)

## 2021-04-04 NOTE — Discharge Instructions (Signed)
Your kidney levels are within the typical ranges for you. Please follow up with your PCP and be sure to stay hydrated with water

## 2021-04-04 NOTE — ED Provider Notes (Signed)
[facility] Emergency Department Provider Note   ____________________________________________    I have reviewed the triage vital signs and the nursing notes.   HISTORY  Chief Complaint Labs     HPI Roger [name] is a 65 y.o. male with history as noted below who was here 3 days ago for multiple complaints, was advised to have his creatinine rechecked as an outpatient so he is represented to the emergency department for this.  He states he feels well, has no physical complaint  Past Medical History:  Diagnosis Date  . Chest pain    a. 04/2016 MV: small, mild apical defect c/w apical ischemia, EF 55-65%-->Low risk study.  . Diabetes mellitus without complication ([place])   . Gout   . Hematochezia   . History of echocardiogram    a. 04/2016 Echo: EF 55-60%, mild LVH, no rwma, midly dil LA.  Marland Kitchen History of heart attack   . Hypertension   . Morbid obesity [facility])     Patient Active Problem List   Diagnosis Date Noted  . Anemia 07/21/2018  . Fatigue due to excessive exertion 04/19/2018  . Vertigo 04/19/2018  . Varicose veins of left leg with edema 04/18/2018  . Hypersomnia 01/14/2017  . Obesity 10/12/2016  . Paresthesia of left lower extremity 09/11/2016  . Rectal fissure 09/11/2016  . Gout 05/18/2016  . Erectile dysfunction 05/18/2016  . Stomach discomfort 05/18/2016  . Diabetes ([place]) 05/18/2016  . History of MI (myocardial infarction) 04/16/2016  . Neuropathy 04/16/2016  . Knee pain 04/16/2016  . Essential hypertension 04/16/2016    Past Surgical History:  Procedure Laterality Date  . COLONOSCOPY WITH PROPOFOL N/A 08/05/2018   Procedure: COLONOSCOPY WITH PROPOFOL;  Surgeon: Lin Landsman, MD;  Location: [facility] ENDOSCOPY;  Service: Gastroenterology;  Laterality: N/A;  . thumb surgery      Prior to Admission medications   Medication Sig Start Date End Date Taking? Authorizing Provider  allopurinol (ZYLOPRIM) 100 MG tablet Take 2  tablets (200 mg total) by mouth 2 (two) times daily. 07/03/20 08/02/20  Coral Spikes, DO  aspirin EC 81 MG tablet Take 1 tablet (81 mg total) by mouth daily. 07/03/20   Coral Spikes, DO  colchicine 0.6 MG tablet Take 1 tablet (0.6 mg total) by mouth 2 (two) times daily. 07/03/20   Coral Spikes, DO  isosorbide mononitrate (IMDUR) 30 MG 24 hr tablet TAKE 1 TABLET(30 MG) BY MOUTH DAILY 07/03/20   Thersa Salt G, DO  metFORMIN (GLUCOPHAGE) 500 MG tablet Take 1 tablet (500 mg total) by mouth 2 (two) times daily with a meal. 07/03/20   Thersa Salt G, DO  metoprolol tartrate (LOPRESSOR) 25 MG tablet Take 1 tablet (25 mg total) by mouth 2 (two) times daily. 07/03/20   Coral Spikes, DO  rosuvastatin (CRESTOR) 10 MG tablet Take 1 tablet (10 mg total) by mouth daily. 07/03/20   Coral Spikes, DO  triamterene-hydrochlorothiazide (MAXZIDE-25) 37.5-25 MG tablet Take 1 tablet by mouth daily. 07/03/20   Coral Spikes, DO  lisinopril (ZESTRIL) 20 MG tablet TAKE 1 TABLET(20 MG) BY MOUTH DAILY 05/13/19 06/25/19  Leone Haven, MD     Allergies Albuterol and Mucinex [guaifenesin er]  Family History  Problem Relation Age of Onset  . Heart Problems Mother   . Breast cancer Mother   . Mental illness Father   . Heart disease Father   . Stroke Father   . Diabetes Father   .  Cancer Father   . Alcohol abuse Father   . Bipolar disorder Father   . Heart disease Other   . Hypertension Other   . Diabetes Other     Social History Social History   Tobacco Use  . Smoking status: Former Smoker    Packs/day: 0.25    Years: 25.00    Pack years: 6.25    Types: Cigarettes    Quit date: 1994    Years since quitting: 28.3  . Smokeless tobacco: Never Used  Vaping Use  . Vaping Use: Never used  Substance Use Topics  . Alcohol use: Yes    Alcohol/week: 42.0 standard drinks    Types: 10 Cans of beer, 32 Shots of liquor per week    Comment: everyday. 3-5 servings whiskey nightly & beer  . Drug use: No    Review of  Systems  Constitutional: No fever/chills  ENT: No sore throat.   Gastrointestinal: No abdominal pain.   Genitourinary: Negative for dysuria. Musculoskeletal: Negative for toe pain Skin: Negative for rash. Neurological: Negative for headaches     ____________________________________________   PHYSICAL EXAM:  VITAL SIGNS: ED Triage Vitals  Enc Vitals Group     BP 04/04/21 1427 (!) 143/93     Pulse Rate 04/04/21 1427 72     Resp 04/04/21 1427 14     Temp 04/04/21 1427 98 F (36.7 C)     Temp Source 04/04/21 1427 Oral     SpO2 04/04/21 1427 97 %     Weight 04/04/21 1429 108 kg (238 lb 1.6 oz)     Height 04/04/21 1429 1.778 m (5\' 10" )     Head Circumference --      Peak Flow --      Pain Score 04/04/21 1427 3     Pain Loc --      Pain Edu? --      Excl. in [place]? --     Constitutional: Alert and oriented. No acute distress. Pleasant and interactive  Nose: No congestion/rhinnorhea. Mouth/Throat: Mucous membranes are moist.   Cardiovascular: Normal rate, regular rhythm.  Respiratory: Normal respiratory effort.  No retractions.  Musculoskeletal: No lower extremity tenderness nor edema.   Neurologic:  Normal speech and language. No gross focal neurologic deficits are appreciated.   Skin:  Skin is warm, dry and intact. No rash noted.   ____________________________________________   LABS (all labs ordered are listed, but only abnormal results are displayed)  Labs Reviewed  BASIC METABOLIC PANEL - Abnormal; Notable for the following components:      Result Value   Sodium 132 (*)    BUN 48 (*)    Creatinine, Ser 1.98 (*)    GFR, Estimated 37 (*)    All other components within normal limits   ____________________________________________  EKG   ____________________________________________  RADIOLOGY None ____________________________________________   PROCEDURES  Procedure(s) performed: No  Procedures   Critical Care performed:  No ____________________________________________   INITIAL IMPRESSION / ASSESSMENT AND PLAN / ED COURSE  Pertinent labs & imaging results that were available during my care of the patient were reviewed by me and considered in my medical decision making (see chart for details).  Will check BMP  BMP is in line with prior levels, recommend hydration outpatient follow-up for continued monitoring   ____________________________________________   FINAL CLINICAL IMPRESSION(S) / ED DIAGNOSES  Final diagnoses:  Chronic kidney disease, unspecified CKD stage      NEW MEDICATIONS STARTED DURING THIS VISIT:  Discharge  Medication List as of 04/04/2021  3:39 PM       Note:  This document was prepared using Dragon voice recognition software and may include unintentional dictation errors.   Lavonia Drafts, MD 04/04/21 [phone]

## 2021-04-04 NOTE — ED Triage Notes (Signed)
See in ED X 3 days ago for gout, told to followup in ED for repeat labwork. Pt unsure which kind of labs. Pt alert and oriented X4, cooperative, RR even and unlabored, color WNL. Pt in NAD.

## 2021-04-10 ENCOUNTER — Ambulatory Visit: Payer: Self-pay | Admitting: Internal Medicine

## 2021-04-10 ENCOUNTER — Other Ambulatory Visit: Payer: Self-pay

## 2021-04-10 DIAGNOSIS — E785 Hyperlipidemia, unspecified: Secondary | ICD-10-CM | POA: Insufficient documentation

## 2021-04-10 NOTE — Progress Notes (Deleted)
Subjective:    Patient ID: Roger [name], male    DOB: November 10, 1956, 65 y.o.   MRN: [id number]  HPI  Patient presents the clinic today for follow-up of chronic conditions.  He is establishing care with me today, transferring care from [name], NP.  HTN: His BP today is.  He is taking Triamterene-HCT and Metoprolol as prescribed.  ECG from 03/2021 reviewed.  HLD s/p MI: His last triglycerides were 560, LDL unable to be calculated, 06/2019.  He denies myalgias on Rosuvastatin.  He is taking Isosorbide and Metoprolol as prescribed he does not consume a low-fat diet.  DM2: His last A1C was 7.3%, 06/2019.  He is taking metformin as prescribed.  His sugars range.  He does not check his feet routinely.  His last eye exam was.  Flu.  Pneumovax.  COVID.  Gout: He denies recent gout attack on Allopurinol and Colchicine.  ED: He has difficulty.  He has not taken any Rx medications for this at this time.  Review of Systems      Past Medical History:  Diagnosis Date  . Chest pain    a. 04/2016 MV: small, mild apical defect c/w apical ischemia, EF 55-65%-->Low risk study.  . Diabetes mellitus without complication ([place])   . Gout   . Hematochezia   . History of echocardiogram    a. 04/2016 Echo: EF 55-60%, mild LVH, no rwma, midly dil LA.  Marland Kitchen History of heart attack   . Hypertension   . Morbid obesity ([place])     Current Outpatient Medications  Medication Sig Dispense Refill  . allopurinol (ZYLOPRIM) 100 MG tablet Take 2 tablets (200 mg total) by mouth 2 (two) times daily. 120 tablet 0  . aspirin EC 81 MG tablet Take 1 tablet (81 mg total) by mouth daily. 30 tablet 0  . colchicine 0.6 MG tablet Take 1 tablet (0.6 mg total) by mouth 2 (two) times daily. 60 tablet 0  . isosorbide mononitrate (IMDUR) 30 MG 24 hr tablet TAKE 1 TABLET(30 MG) BY MOUTH DAILY 30 tablet 0  . metFORMIN (GLUCOPHAGE) 500 MG tablet Take 1 tablet (500 mg total) by mouth 2 (two) times daily with a meal. 60 tablet 0  .  metoprolol tartrate (LOPRESSOR) 25 MG tablet Take 1 tablet (25 mg total) by mouth 2 (two) times daily. 30 tablet 0  . rosuvastatin (CRESTOR) 10 MG tablet Take 1 tablet (10 mg total) by mouth daily. 30 tablet 0  . triamterene-hydrochlorothiazide (MAXZIDE-25) 37.5-25 MG tablet Take 1 tablet by mouth daily. 30 tablet 0   No current facility-administered medications for this visit.    Allergies  Allergen Reactions  . Albuterol Nausea And Vomiting  . Mucinex [Guaifenesin Er] Rash    Family History  Problem Relation Age of Onset  . Heart Problems Mother   . Breast cancer Mother   . Mental illness Father   . Heart disease Father   . Stroke Father   . Diabetes Father   . Cancer Father   . Alcohol abuse Father   . Bipolar disorder Father   . Heart disease Other   . Hypertension Other   . Diabetes Other     Social History   Socioeconomic History  . Marital status: Divorced    Spouse name: Not on file  . Number of children: 5  . Years of education: Not on file  . Highest education level: High school graduate  Occupational History  . Not on file  Tobacco  Use  . Smoking status: Former Smoker    Packs/day: 0.25    Years: 25.00    Pack years: 6.25    Types: Cigarettes    Quit date: 1994    Years since quitting: 28.3  . Smokeless tobacco: Never Used  Vaping Use  . Vaping Use: Never used  Substance and Sexual Activity  . Alcohol use: Yes    Alcohol/week: 42.0 standard drinks    Types: 10 Cans of beer, 32 Shots of liquor per week    Comment: everyday. 3-5 servings whiskey nightly & beer  . Drug use: No  . Sexual activity: Not on file  Other Topics Concern  . Not on file  Social History Narrative  . Not on file   Social Determinants of Health   Financial Resource Strain: Not on file  Food Insecurity: Not on file  Transportation Needs: Not on file  Physical Activity: Not on file  Stress: Not on file  Social Connections: Not on file  Intimate Partner Violence: Not on  file     Constitutional: Denies fever, malaise, fatigue, headache or abrupt weight changes.  HEENT: Denies eye pain, eye redness, ear pain, ringing in the ears, wax buildup, runny nose, nasal congestion, bloody nose, or sore throat. Respiratory: Denies difficulty breathing, shortness of breath, cough or sputum production.   Cardiovascular: Denies chest pain, chest tightness, palpitations or swelling in the hands or feet.  Gastrointestinal: Denies abdominal pain, bloating, constipation, diarrhea or blood in the stool.  GU: Denies urgency, frequency, pain with urination, burning sensation, blood in urine, odor or discharge. Musculoskeletal: Denies decrease in range of motion, difficulty with gait, muscle pain or joint pain and swelling.  Skin: Denies redness, rashes, lesions or ulcercations.  Neurological: Denies dizziness, difficulty with memory, difficulty with speech or problems with balance and coordination.  Psych: Denies anxiety, depression, SI/HI.  No other specific complaints in a complete review of systems (except as listed in HPI above).  Objective:   Physical Exam   There were no vitals taken for this visit. Wt Readings from Last 3 Encounters:  04/04/21 238 lb 1.6 oz (108 kg)  04/01/21 240 lb (108.9 kg)  11/24/20 240 lb 1.3 oz (108.9 kg)    General: Appears their stated age, well developed, well nourished in NAD. Skin: Warm, dry and intact. No rashes, lesions or ulcerations noted. HEENT: Head: normal shape and size; Eyes: sclera white, no icterus, conjunctiva pink, PERRLA and EOMs intact; Ears: Tm's gray and intact, normal light reflex; Nose: mucosa pink and moist, septum midline; Throat/Mouth: Teeth present, mucosa pink and moist, no exudate, lesions or ulcerations noted.  Neck:  Neck supple, trachea midline. No masses, lumps or thyromegaly present.  Cardiovascular: Normal rate and rhythm. S1,S2 noted.  No murmur, rubs or gallops noted. No JVD or BLE edema. No carotid bruits  noted. Pulmonary/Chest: Normal effort and positive vesicular breath sounds. No respiratory distress. No wheezes, rales or ronchi noted.  Abdomen: Soft and nontender. Normal bowel sounds. No distention or masses noted. Liver, spleen and kidneys non palpable. Musculoskeletal: Normal range of motion. No signs of joint swelling. No difficulty with gait.  Neurological: Alert and oriented. Cranial nerves II-XII grossly intact. Coordination normal.  Psychiatric: Mood and affect normal. Behavior is normal. Judgment and thought content normal.    BMET    Component Value Date/Time   NA 132 (L) 04/04/2021 1457   NA 137 09/23/2013 1256   K 4.8 04/04/2021 1457   K 3.9 09/23/2013 1256  CL 99 04/04/2021 1457   CL 106 09/23/2013 1256   CO2 24 04/04/2021 1457   CO2 28 09/23/2013 1256   GLUCOSE 94 04/04/2021 1457   GLUCOSE 116 (H) 09/23/2013 1256   BUN 48 (H) 04/04/2021 1457   BUN 27 (H) 09/23/2013 1256   CREATININE 1.98 (H) 04/04/2021 1457   CREATININE 2.34 (H) 07/17/2019 1133   CALCIUM 10.2 04/04/2021 1457   CALCIUM 9.5 09/23/2013 1256   GFRNONAA 37 (L) 04/04/2021 1457   GFRNONAA 29 (L) 07/17/2019 1133   GFRAA >60 07/03/2020 1111   GFRAA 34 (L) 07/17/2019 1133    Lipid Panel     Component Value Date/Time   CHOL 246 (H) 07/17/2019 1133   TRIG 560 (H) 07/17/2019 1133   HDL 29 (L) 07/17/2019 1133   CHOLHDL 8.5 (H) 07/17/2019 1133   LDLCALC  07/17/2019 1133     Comment:     . LDL cholesterol not calculated. Triglyceride levels greater than 400 mg/dL invalidate calculated LDL results. . Reference range: <100 . Desirable range <100 mg/dL for primary prevention;   <70 mg/dL for patients with CHD or diabetic patients  with > or = 2 CHD risk factors. Marland Kitchen LDL-C is now calculated using the Martin-Hopkins  calculation, which is a validated novel method providing  better accuracy than the Friedewald equation in the  estimation of LDL-C.  Cresenciano Genre et al. Annamaria Helling. 6812;751(70): 2061-2068   (http://education.[employer].com/faq/FAQ164)     CBC    Component Value Date/Time   WBC 7.4 04/01/2021 1033   RBC 3.74 (L) 04/01/2021 1033   HGB 12.6 (L) 04/01/2021 1033   HGB 11.5 (L) 09/23/2013 1256   HCT 37.2 (L) 04/01/2021 1033   HCT 33.6 (L) 09/23/2013 1256   PLT 247 04/01/2021 1033   PLT 354 09/23/2013 1256   MCV 99.5 04/01/2021 1033   MCV 91 09/23/2013 1256   MCH 33.7 04/01/2021 1033   MCHC 33.9 04/01/2021 1033   RDW 12.7 04/01/2021 1033   RDW 12.9 09/23/2013 1256   LYMPHSABS 2.0 08/01/2018 1102   LYMPHSABS 2.1 09/23/2013 1256   MONOABS 0.5 08/01/2018 1102   MONOABS 0.5 09/23/2013 1256   EOSABS 0.2 08/01/2018 1102   EOSABS 0.3 09/23/2013 1256   BASOSABS 0.1 08/01/2018 1102   BASOSABS 0.1 09/23/2013 1256    Hgb A1C Lab Results  Component Value Date   HGBA1C 7.3 (H) 07/17/2019           Assessment & Plan:    Webb Silversmith, NP This visit occurred during the SARS-CoV-2 public health emergency.  Safety protocols were in place, including screening questions prior to the visit, additional usage of staff PPE, and extensive cleaning of exam room while observing appropriate contact time as indicated for disinfecting solutions.

## 2021-06-18 ENCOUNTER — Ambulatory Visit
Admission: EM | Admit: 2021-06-18 | Discharge: 2021-06-18 | Disposition: A | Discharge status: Home / Self Care | Payer: BLUE CROSS/BLUE SHIELD | Attending: Physician Assistant | Admitting: Physician Assistant

## 2021-06-18 ENCOUNTER — Other Ambulatory Visit: Payer: Self-pay

## 2021-06-18 ENCOUNTER — Encounter: Payer: Self-pay | Admitting: Emergency Medicine

## 2021-06-18 DIAGNOSIS — M109 Gout, unspecified: Secondary | ICD-10-CM

## 2021-06-18 MED ORDER — PREDNISONE 20 MG PO TABS: 40.0000 mg | ORAL_TABLET | Freq: Every day | ORAL | 0 refills | Status: AC

## 2021-06-18 MED ORDER — COLCHICINE 0.6 MG PO TABS: 0.6000 mg | ORAL_TABLET | Freq: Two times a day (BID) | ORAL | 0 refills | Status: DC

## 2021-06-18 NOTE — ED Provider Notes (Signed)
[facility]    CSN: [id number] Arrival date & time: 06/18/21  1009      History   Chief Complaint Chief Complaint  Patient presents with   Hand Pain    Left     HPI [name] is a 65 y.o. male presenting for approximately 2-week history of left hand pain, swelling and redness.  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  He says he has had Roger [name] flareup in his hand before.  He has been taking ibuprofen and Tylenol and icing the hand without any improvement in symptoms.  He says pain is 10 out of 10.  He says he cannot grip anything or open a door without significant pain in the hand.  Patient says he normally takes allopurinol but has run out.  He says he also takes colchicine but does not have any more of that.  Additionally, patient says that whenever he gets flareups he often needs corticosteroids.  His last flareup was about 2 months ago.  Patient admits that he does not see a regular PCP but does have multiple medical problems including Roger [name], Roger, Roger [name], Roger [name] and Roger [name].  Patient says he did contact [facility] and they take his insurance so he plans to set up an appointment with a PCP there.  He is hoping to get some relief of his suspected Roger [name] flareup of the left hand currently.  No other complaints.  HPI  Past Medical History:  Diagnosis Date   Chest pain    a. 04/2016 MV: small, mild apical defect c/w apical ischemia, EF 55-65%-->Low risk study.   Roger [name] without complication ([place])    Roger [name]    Hematochezia    History of echocardiogram    a. 04/2016 Echo: EF 55-60%, mild LVH, no rwma, midly dil LA.   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 Authorizing Provider  aspirin EC 81 MG tablet Take 1 tablet (81 mg total) by mouth daily. 07/03/20  Yes Cook, Jayce G, DO  isosorbide mononitrate (IMDUR) 30 MG 24 hr tablet TAKE 1 TABLET(30 MG) BY MOUTH DAILY 07/03/20  Yes Lacinda Axon, Jayce G, DO  metFORMIN (GLUCOPHAGE) 500 MG tablet Take 1 tablet (500 mg total) by mouth 2 (two) times daily with a meal. 07/03/20  Yes Cook, Jayce G, DO  metoprolol tartrate (LOPRESSOR) 25 MG tablet Take 1 tablet (25 mg total) by mouth 2 (two) times daily. 07/03/20  Yes Cook, Jayce G, DO  predniSONE (DELTASONE) 20 MG tablet Take 2 tablets (40 mg total) by mouth daily for 5 days. 06/18/21 06/23/21 Yes Laurene Footman B, PA-C  rosuvastatin (CRESTOR) 10 MG tablet Take 1 tablet (10 mg total) by mouth daily. 07/03/20  Yes Cook, Jayce G, DO  triamterene-hydrochlorothiazide (MAXZIDE-25) 37.5-25 MG tablet Take 1  tablet by mouth daily. 07/03/20  Yes Cook, Jayce G, DO  allopurinol (ZYLOPRIM) 100 MG tablet Take 2 tablets (200 mg total) by mouth 2 (two) times daily. 07/03/20 08/02/20  Coral Spikes, DO  colchicine 0.6 MG tablet Take 1 tablet (0.6 mg total) by mouth 2 (two) times daily. 06/18/21 07/18/21  Laurene Footman B, PA-C  lisinopril (ZESTRIL) 20 MG tablet TAKE 1 TABLET(20 MG) BY MOUTH DAILY 05/13/19 06/25/19  Leone Haven, MD    Family History Family History  Problem Relation Age of Onset   Heart Problems Mother    Breast cancer Mother    Mental illness Father    Heart [name] Father    Stroke Father    Roger Father    Cancer Father    Alcohol abuse Father    Bipolar disorder Father    Heart [name] Other     Roger [name] Other    Roger Other     Social History Social History   Tobacco Use   Smoking status: Former    Packs/day: 0.25    Years: 25.00    Pack years: 6.25    Types: Cigarettes    Quit date: 1994    Years since quitting: 28.5   Smokeless tobacco: Never  Vaping Use   Vaping Use: Never used  Substance Use Topics   Alcohol use: Yes    Alcohol/week: 42.0 standard drinks    Types: 10 Cans of beer, 32 Shots of liquor per week    Comment: everyday. 3-5 servings whiskey nightly & beer   Drug use: No     Allergies   Albuterol and Mucinex [guaifenesin er]   Review of Systems Review of Systems  Constitutional:  Negative for fatigue and fever.  Musculoskeletal:  Positive for arthralgias and joint swelling.  Skin:  Positive for color change. Negative for rash and wound.  Neurological:  Negative for weakness and numbness.    Physical Exam Triage Vital Signs ED Triage Vitals  Enc Vitals Group     BP 06/18/21 1156 (!) 163/102     Pulse Rate 06/18/21 1156 64     Resp 06/18/21 1156 16     Temp 06/18/21 1156 98.5 F (36.9 C)     Temp Source 06/18/21 1156 Oral     SpO2 06/18/21 1156 99 %     Weight 06/18/21 1154 240 lb (108.9 kg)     Height 06/18/21 1154 '5\' 10"'$  (1.778 m)     Head Circumference --      Peak Flow --      Pain Score 06/18/21 1154 10     Pain Loc --      Pain Edu? --      Excl. in [place]? --    No data found.  Updated Vital Signs BP (!) 163/102 (BP Location: Left Arm)   Pulse 64   Temp 98.5 F (36.9 C) (Oral)   Resp 16   Ht '5\' 10"'$  (1.778 m)   Wt 240 lb (108.9 kg)   SpO2 99%   BMI 34.44 kg/m      Physical Exam Vitals and nursing note reviewed.  Constitutional:      General: He is not in acute distress.    Appearance: Normal appearance. He is well-developed. He is not ill-appearing.  HENT:     Head: Normocephalic and atraumatic.  Eyes:     General: No scleral icterus.    Conjunctiva/sclera: Conjunctivae normal.  Cardiovascular:     Rate  and Rhythm:  Normal rate and regular rhythm.     Pulses: Normal pulses.  Pulmonary:     Effort: Pulmonary effort is normal. No respiratory distress.     Breath sounds: Normal breath sounds.  Musculoskeletal:     Left hand: Swelling (moderate swelling dorsal hand over 2nd and 3rd MCPs with mild erythema, warmth) and bony tenderness (MCPs 2nd and 3rd) present. Decreased range of motion. Normal capillary refill. Normal pulse.     Cervical back: Neck supple.  Skin:    General: Skin is warm and dry.  Neurological:     General: No focal deficit present.     Mental Status: He is alert. Mental status is at baseline.     Motor: No weakness.     Gait: Gait normal.  Psychiatric:        Mood and Affect: Mood normal.        Behavior: Behavior normal.        Thought Content: Thought content normal.     UC Treatments / Results  Labs (all labs ordered are listed, but only abnormal results are displayed) Labs Reviewed - No data to display  EKG   Radiology No results found.  Procedures Procedures (including critical care time)  Medications Ordered in UC Medications - No data to display  Initial Impression / Assessment and Plan / UC Course  I have reviewed the triage vital signs and the nursing notes.  Pertinent labs & imaging results that were available during my care of the patient were reviewed by me and considered in my medical decision making (see chart for details).  65 year old male presenting for left hand pain and swelling.  Has positive history of Roger [name].  Exam today reveals mild erythema, moderate swelling and significant tenderness to palpation of the second and third MCP joints of the left hand.  Since he has had Roger [name] in his hand before as well as multiple other joints, treating him at this time with prednisone since it seems as though he failed NSAIDs and he should not be taking those anyway due to his Roger [name].  Also refill the colchicine.  Advised him to get set up  with a primary care provider as he may need a dose change of the allopurinol or a different medication altogether.  Patient agrees.   Final Clinical Impressions(s) / UC Diagnoses   Final diagnoses:  Acute Roger [name] of left hand, unspecified cause     Discharge Instructions      I have refilled the colchicine.  Also sent prednisone for a Roger [name] flare.  Ice the hand.  He can also take Tylenol as needed for pain.  It is imperative that you follow-up with a primary care provider.  Since you found a provider in her network, please call to set up an appointment.  There are other medications that you can take to try to prevent flareups.  He should not be having multiple flareups a year.     ED Prescriptions     Medication Sig Dispense Auth. Provider   colchicine 0.6 MG tablet Take 1 tablet (0.6 mg total) by mouth 2 (two) times daily. 60 tablet Laurene Footman B, PA-C   predniSONE (DELTASONE) 20 MG tablet Take 2 tablets (40 mg total) by mouth daily for 5 days. 10 tablet Gretta Cool      PDMP not reviewed this encounter.   Danton Clap, PA-C 06/18/21 [phone]

## 2021-06-18 NOTE — Discharge Instructions (Addendum)
I have refilled the colchicine.  Also sent prednisone for a gout flare.  Ice the hand.  He can also take Tylenol as needed for pain.  It is imperative that you follow-up with a primary care provider.  Since you found a provider in her network, please call to set up an appointment.  There are other medications that you can take to try to prevent flareups.  He should not be having multiple flareups a year.

## 2021-06-18 NOTE — ED Triage Notes (Signed)
Patient c/o left hand pain and swelling that started 2 weeks ago.  Patient denies injury or fall.  Patient has history of gout.

## 2021-08-07 ENCOUNTER — Emergency Department
Admission: EM | Admit: 2021-08-07 | Discharge: 2021-08-07 | Disposition: A | Discharge status: Home / Self Care | Payer: 59 | Attending: Emergency Medicine | Admitting: Emergency Medicine

## 2021-08-07 ENCOUNTER — Telehealth: Payer: Self-pay | Admitting: Family Medicine

## 2021-08-07 ENCOUNTER — Other Ambulatory Visit: Payer: Self-pay

## 2021-08-07 ENCOUNTER — Emergency Department: Payer: 59

## 2021-08-07 ENCOUNTER — Encounter: Payer: Self-pay | Admitting: Emergency Medicine

## 2021-08-07 DIAGNOSIS — I10 Essential (primary) hypertension: Secondary | ICD-10-CM | POA: Diagnosis not present

## 2021-08-07 DIAGNOSIS — Z7982 Long term (current) use of aspirin: Secondary | ICD-10-CM | POA: Diagnosis not present

## 2021-08-07 DIAGNOSIS — Z79899 Other long term (current) drug therapy: Secondary | ICD-10-CM | POA: Insufficient documentation

## 2021-08-07 DIAGNOSIS — E119 Type 2 diabetes mellitus without complications: Secondary | ICD-10-CM | POA: Insufficient documentation

## 2021-08-07 DIAGNOSIS — Z7984 Long term (current) use of oral hypoglycemic drugs: Secondary | ICD-10-CM | POA: Insufficient documentation

## 2021-08-07 DIAGNOSIS — M25561 Pain in right knee: Secondary | ICD-10-CM

## 2021-08-07 DIAGNOSIS — M10061 Idiopathic gout, right knee: Secondary | ICD-10-CM | POA: Diagnosis not present

## 2021-08-07 DIAGNOSIS — Z87891 Personal history of nicotine dependence: Secondary | ICD-10-CM | POA: Diagnosis not present

## 2021-08-07 DIAGNOSIS — M109 Gout, unspecified: Secondary | ICD-10-CM

## 2021-08-07 MED ORDER — PREDNISONE 10 MG PO TABS: 10.0000 mg | ORAL_TABLET | Freq: Every day | ORAL | 0 refills | Status: DC

## 2021-08-07 MED ORDER — PREDNISONE 20 MG PO TABS
60.0000 mg | ORAL_TABLET | Freq: Once | ORAL | Status: AC
  Administered 2021-08-07: 60 mg via ORAL
  Filled 2021-08-07: qty 3

## 2021-08-07 NOTE — Telephone Encounter (Signed)
At the moment I do not have good enough availability to take on additional patients.  There is almost a 80-monthwait to get an appointment at this time.

## 2021-08-07 NOTE — ED Provider Notes (Signed)
[facility] Emergency Department Provider Note  Time seen: 2:55 PM  I have reviewed the triage vital signs and the nursing notes.   HISTORY  Chief Complaint Knee Pain   HPI BRONX MCINDOE is a 65 y.o. male with a past medical history of diabetes, gout, hypertension, presents to the emergency department for right knee swelling and pain.  Patient states he has been using his crutches intermittently over the past week or so for pain and swelling to the right knee.  Patient states at times it will swell up and then seem to come back down but then swell up again.  Patient has a history of gout states he has had gout multiple times in both knees.  Patient states chills last week but denies any chills or fever this week.   Past Medical History:  Diagnosis Date   Chest pain    a. 04/2016 MV: small, mild apical defect c/w apical ischemia, EF 55-65%-->Low risk study.   Diabetes mellitus without complication ([place])    Gout    Hematochezia    History of echocardiogram    a. 04/2016 Echo: EF 55-60%, mild LVH, no rwma, midly dil LA.   History of heart attack    Hypertension    Morbid obesity [facility])     Patient Active Problem List   Diagnosis Date Noted   HLD (hyperlipidemia) 04/10/2021   Gout 05/18/2016   Erectile dysfunction 05/18/2016   Diabetes ([place]) 05/18/2016   Essential hypertension 04/16/2016    Past Surgical History:  Procedure Laterality Date   COLONOSCOPY WITH PROPOFOL N/A 08/05/2018   Procedure: COLONOSCOPY WITH PROPOFOL;  Surgeon: Lin Landsman, MD;  Location: [facility] ENDOSCOPY;  Service: Gastroenterology;  Laterality: N/A;   thumb surgery      Prior to Admission medications   Medication Sig Start Date End Date Taking? Authorizing Provider  allopurinol (ZYLOPRIM) 100 MG tablet Take 2 tablets (200 mg total) by mouth 2 (two) times daily. 07/03/20 08/02/20  Coral Spikes, DO  aspirin EC 81 MG tablet Take 1 tablet (81 mg total) by mouth daily. 07/03/20    Coral Spikes, DO  colchicine 0.6 MG tablet Take 1 tablet (0.6 mg total) by mouth 2 (two) times daily. 06/18/21 07/18/21  Laurene Footman B, PA-C  isosorbide mononitrate (IMDUR) 30 MG 24 hr tablet TAKE 1 TABLET(30 MG) BY MOUTH DAILY 07/03/20   Thersa Salt G, DO  metFORMIN (GLUCOPHAGE) 500 MG tablet Take 1 tablet (500 mg total) by mouth 2 (two) times daily with a meal. 07/03/20   Thersa Salt G, DO  metoprolol tartrate (LOPRESSOR) 25 MG tablet Take 1 tablet (25 mg total) by mouth 2 (two) times daily. 07/03/20   Coral Spikes, DO  rosuvastatin (CRESTOR) 10 MG tablet Take 1 tablet (10 mg total) by mouth daily. 07/03/20   Coral Spikes, DO  triamterene-hydrochlorothiazide (MAXZIDE-25) 37.5-25 MG tablet Take 1 tablet by mouth daily. 07/03/20   Coral Spikes, DO  lisinopril (ZESTRIL) 20 MG tablet TAKE 1 TABLET(20 MG) BY MOUTH DAILY 05/13/19 06/25/19  Leone Haven, MD    Allergies  Allergen Reactions   Albuterol Nausea And Vomiting   Mucinex [Guaifenesin Er] Rash    Family History  Problem Relation Age of Onset   Heart Problems Mother    Breast cancer Mother    Mental illness Father    Heart disease Father    Stroke Father    Diabetes Father    Cancer Father  Alcohol abuse Father    Bipolar disorder Father    Heart disease Other    Hypertension Other    Diabetes Other     Social History Social History   Tobacco Use   Smoking status: Former    Packs/day: 0.25    Years: 25.00    Pack years: 6.25    Types: Cigarettes    Quit date: 1994    Years since quitting: 28.7   Smokeless tobacco: Never  Vaping Use   Vaping Use: Never used  Substance Use Topics   Alcohol use: Yes    Alcohol/week: 42.0 standard drinks    Types: 10 Cans of beer, 32 Shots of liquor per week    Comment: everyday. 3-5 servings whiskey nightly & beer   Drug use: No    Review of Systems Constitutional: Negative for fever. Cardiovascular: Negative for chest pain. Respiratory: Negative for shortness of  breath. Gastrointestinal: Negative for abdominal pain Musculoskeletal: Moderate dull right knee pain and swelling Skin: Negative for skin complaints  Neurological: Negative for headache All other ROS negative  ____________________________________________   PHYSICAL EXAM:  VITAL SIGNS: ED Triage Vitals  Enc Vitals Group     BP 08/07/21 1318 (!) 176/103     Pulse Rate 08/07/21 1318 84     Resp 08/07/21 1318 20     Temp 08/07/21 1318 98.4 F (36.9 C)     Temp Source 08/07/21 1318 Oral     SpO2 08/07/21 1318 97 %     Weight 08/07/21 1312 240 lb 1.3 oz (108.9 kg)     Height 08/07/21 1312 '5\' 10"'$  (1.778 m)     Head Circumference --      Peak Flow --      Pain Score 08/07/21 1324 9     Pain Loc --      Pain Edu? --      Excl. in [place]? --    Constitutional: Alert and oriented. Well appearing and in no distress. Eyes: Normal exam ENT      Head: Normocephalic and atraumatic.      Mouth/Throat: Mucous membranes are moist. Cardiovascular: Normal rate, regular rhythm.  Respiratory: Normal respiratory effort without tachypnea nor retractions. Breath sounds are clear  Gastrointestinal: Soft and nontender. No distention. Musculoskeletal: Patient has moderate effusion in the right knee with moderate tenderness to palpation of the area.  Neuro vastly intact distally.  Mild pain with range of motion. Neurologic:  Normal speech and language. No gross focal neurologic deficits  Skin:  Skin is warm, dry and intact.  Psychiatric: Mood and affect are normal.   ____________________________________________   RADIOLOGY  X-ray shows significant arthritis with a small joint effusion  ____________________________________________   INITIAL IMPRESSION / ASSESSMENT AND PLAN / ED COURSE  Pertinent labs & imaging results that were available during my care of the patient were reviewed by me and considered in my medical decision making (see chart for details).   Patient presents emergency  department for right knee pain and swelling.  Examination is consistent with a moderate knee effusion with tenderness to the area highly suspect gout given the patient's history and clinical presentation.  Patient states he has had gout multiple times in each knee in the past.  Record review shows baseline CKD we will start the patient on prednisone.  I did offer to perform an arthrocentesis for the patient not only to test for gout but also to remove some of the fluid which could help with his  discomfort.  Patient states he would prefer to try the prednisone first as that has worked in the past.  If the pain does not improve with prednisone patient is to return to the emergency department for further evaluation and possible arthrocentesis.  No acute findings on x-ray besides arthritis and small joint effusion.  We will discharge patient on a prednisone taper have the patient follow-up with his doctor.  Discussed return precautions.  [name] was evaluated in Emergency Department on 08/07/2021 for the symptoms described in the history of present illness. He was evaluated in the context of the global COVID-19 pandemic, which necessitated consideration that the patient might be at risk for infection with the SARS-CoV-2 virus that causes COVID-19. Institutional protocols and algorithms that pertain to the evaluation of patients at risk for COVID-19 are in a state of rapid change based on information released by regulatory bodies including the CDC and federal and state organizations. These policies and algorithms were followed during the patient's care in the ED.  ____________________________________________   FINAL CLINICAL IMPRESSION(S) / ED DIAGNOSES  Right knee gout   Harvest Dark, MD 08/07/21 1530

## 2021-08-07 NOTE — Telephone Encounter (Signed)
Patient was last seen by Dr Caryl Bis on 12/08/2018. It looks like he may have seen other providers else where. Patient would like to re-establish with Dr Caryl Bis. Patient was advised that a note would be sent back to Dr Caryl Bis to see if patient can re-establish with him. Will Dr Caryl Bis take him back.

## 2021-08-07 NOTE — ED Triage Notes (Signed)
Presents with swelling and  pain to right knee  unsure of injury  thinks it may be gout  hx of same

## 2021-08-07 NOTE — Telephone Encounter (Signed)
Spoke with pt. Pt is going to establish care with [facility] if we are within network for his insurance. Pt will call insurance and call office back and schedule if so.

## 2021-12-11 ENCOUNTER — Ambulatory Visit: Payer: Self-pay | Admitting: *Deleted

## 2021-12-11 NOTE — Telephone Encounter (Signed)
°  Chief Complaint: Gout flare up Symptoms: Pain 10/10 in both feet, both knees, knot on left knee Frequency: 2-3 weeks Pertinent Negatives: Patient denies fever Disposition: [] ED /[x] Urgent Care (no appt availability in office) / [] Appointment(In office/virtual)/ []  [place] [facility]/ [] Home Care/ [] Refused Recommended Disposition /[] [place] [facility]/ []  Follow-up with PCP Additional Notes: [facility], declines Reason for Disposition  [1] SEVERE pain (e.g., excruciating, unable to do any normal activities) AND [2] not improved after 2 hours of pain medicine  Answer Assessment - Initial Assessment Questions 1. ONSET: "When did the pain start?"      2-3 weeks 2. LOCATION: "Where is the pain located?"      Both feet and knees 3. PAIN: "How bad is the pain?"    (Scale 1-10; or mild, moderate, severe)  - MILD (1-3): doesn't interfere with normal activities.   - MODERATE (4-7): interferes with normal activities (e.g., work or school) or awakens from sleep, limping.   - SEVERE (8-10): excruciating pain, unable to do any normal activities, unable to walk.      10/10 4. WORK OR EXERCISE: "Has there been any recent work or exercise that involved this part of the body?"      No H/O gout 5. CAUSE: "What do you think is causing the foot pain?"     Gout 6. OTHER SYMPTOMS: "Do you have any other symptoms?" (e.g., leg pain, rash, fever, numbness)     no  Protocols used: Foot Pain-A-AH

## 2021-12-17 ENCOUNTER — Encounter: Payer: Self-pay | Admitting: Emergency Medicine

## 2021-12-17 ENCOUNTER — Emergency Department: Payer: 59

## 2021-12-17 ENCOUNTER — Other Ambulatory Visit: Payer: Self-pay

## 2021-12-17 ENCOUNTER — Emergency Department
Admission: EM | Admit: 2021-12-17 | Discharge: 2021-12-17 | Disposition: A | Discharge status: Home / Self Care | Payer: 59 | Attending: Emergency Medicine | Admitting: Emergency Medicine

## 2021-12-17 DIAGNOSIS — R509 Fever, unspecified: Secondary | ICD-10-CM | POA: Insufficient documentation

## 2021-12-17 DIAGNOSIS — M10072 Idiopathic gout, left ankle and foot: Secondary | ICD-10-CM | POA: Insufficient documentation

## 2021-12-17 DIAGNOSIS — Z20822 Contact with and (suspected) exposure to covid-19: Secondary | ICD-10-CM | POA: Insufficient documentation

## 2021-12-17 DIAGNOSIS — M109 Gout, unspecified: Secondary | ICD-10-CM

## 2021-12-17 DIAGNOSIS — Z760 Encounter for issue of repeat prescription: Secondary | ICD-10-CM | POA: Diagnosis not present

## 2021-12-17 DIAGNOSIS — R52 Pain, unspecified: Secondary | ICD-10-CM

## 2021-12-17 DIAGNOSIS — R0602 Shortness of breath: Secondary | ICD-10-CM | POA: Diagnosis not present

## 2021-12-17 DIAGNOSIS — I10 Essential (primary) hypertension: Secondary | ICD-10-CM | POA: Diagnosis not present

## 2021-12-17 DIAGNOSIS — M79604 Pain in right leg: Secondary | ICD-10-CM | POA: Diagnosis present

## 2021-12-17 DIAGNOSIS — E1169 Type 2 diabetes mellitus with other specified complication: Secondary | ICD-10-CM

## 2021-12-17 DIAGNOSIS — E119 Type 2 diabetes mellitus without complications: Secondary | ICD-10-CM | POA: Diagnosis not present

## 2021-12-17 DIAGNOSIS — M1000 Idiopathic gout, unspecified site: Secondary | ICD-10-CM | POA: Insufficient documentation

## 2021-12-17 DIAGNOSIS — E1165 Type 2 diabetes mellitus with hyperglycemia: Secondary | ICD-10-CM

## 2021-12-17 LAB — CBC WITH DIFFERENTIAL/PLATELET
Abs Immature Granulocytes: 0.07 10*3/uL (ref 0.00–0.07)
Basophils Absolute: 0.1 10*3/uL (ref 0.0–0.1)
Basophils Relative: 1 %
Eosinophils Absolute: 0.1 10*3/uL (ref 0.0–0.5)
Eosinophils Relative: 2 %
HCT: 36.3 % — ABNORMAL LOW (ref 39.0–52.0)
Hemoglobin: 12.2 g/dL — ABNORMAL LOW (ref 13.0–17.0)
Immature Granulocytes: 1 %
Lymphocytes Relative: 22 %
Lymphs Abs: 1.7 10*3/uL (ref 0.7–4.0)
MCH: 34 pg (ref 26.0–34.0)
MCHC: 33.6 g/dL (ref 30.0–36.0)
MCV: 101.1 fL — ABNORMAL HIGH (ref 80.0–100.0)
Monocytes Absolute: 0.6 10*3/uL (ref 0.1–1.0)
Monocytes Relative: 8 %
Neutro Abs: 5.4 10*3/uL (ref 1.7–7.7)
Neutrophils Relative %: 66 %
Platelets: 290 10*3/uL (ref 150–400)
RBC: 3.59 MIL/uL — ABNORMAL LOW (ref 4.22–5.81)
RDW: 12.1 % (ref 11.5–15.5)
WBC: 8 10*3/uL (ref 4.0–10.5)
nRBC: 0 % (ref 0.0–0.2)

## 2021-12-17 LAB — COMPREHENSIVE METABOLIC PANEL
ALT: 9 U/L (ref 0–44)
AST: 19 U/L (ref 15–41)
Albumin: 2.9 g/dL — ABNORMAL LOW (ref 3.5–5.0)
Alkaline Phosphatase: 109 U/L (ref 38–126)
Anion gap: 7 (ref 5–15)
BUN: 19 mg/dL (ref 8–23)
CO2: 27 mmol/L (ref 22–32)
Calcium: 9.1 mg/dL (ref 8.9–10.3)
Chloride: 103 mmol/L (ref 98–111)
Creatinine, Ser: 1.48 mg/dL — ABNORMAL HIGH (ref 0.61–1.24)
GFR, Estimated: 53 mL/min — ABNORMAL LOW (ref >60)
Glucose, Bld: 109 mg/dL — ABNORMAL HIGH (ref 70–99)
Potassium: 4.6 mmol/L (ref 3.5–5.1)
Sodium: 137 mmol/L (ref 135–145)
Total Bilirubin: 0.3 mg/dL (ref 0.3–1.2)
Total Protein: 6.6 g/dL (ref 6.5–8.1)

## 2021-12-17 LAB — BRAIN NATRIURETIC PEPTIDE: B Natriuretic Peptide: 57.3 pg/mL (ref 0.0–100.0)

## 2021-12-17 LAB — URIC ACID: Uric Acid, Serum: 9.2 mg/dL — ABNORMAL HIGH (ref 3.7–8.6)

## 2021-12-17 LAB — TROPONIN I (HIGH SENSITIVITY)
Troponin I (High Sensitivity): 8 ng/L (ref <18)
Troponin I (High Sensitivity): 7 ng/L (ref <18)

## 2021-12-17 LAB — LACTIC ACID, PLASMA: Lactic Acid, Venous: 1.8 mmol/L (ref 0.5–1.9)

## 2021-12-17 LAB — RESP PANEL BY RT-PCR (FLU A&B, COVID) ARPGX2
Influenza A by PCR: NEGATIVE
Influenza B by PCR: NEGATIVE
SARS Coronavirus 2 by RT PCR: NEGATIVE

## 2021-12-17 MED ORDER — ISOSORBIDE MONONITRATE ER 30 MG PO TB24: ORAL_TABLET | ORAL | 0 refills | Status: DC

## 2021-12-17 MED ORDER — ROSUVASTATIN CALCIUM 10 MG PO TABS: 10.0000 mg | ORAL_TABLET | Freq: Every day | ORAL | 0 refills | Status: DC

## 2021-12-17 MED ORDER — SODIUM CHLORIDE 0.9 % IV BOLUS
500.0000 mL | Freq: Once | INTRAVENOUS | Status: AC
  Administered 2021-12-17: 500 mL via INTRAVENOUS

## 2021-12-17 MED ORDER — ASPIRIN EC 81 MG PO TBEC: 81.0000 mg | DELAYED_RELEASE_TABLET | Freq: Every day | ORAL | 0 refills | Status: DC

## 2021-12-17 MED ORDER — METOPROLOL TARTRATE 25 MG PO TABS: 25.0000 mg | ORAL_TABLET | Freq: Two times a day (BID) | ORAL | 0 refills | Status: DC

## 2021-12-17 MED ORDER — DEXAMETHASONE SODIUM PHOSPHATE 10 MG/ML IJ SOLN
10.0000 mg | Freq: Once | INTRAMUSCULAR | Status: AC
  Administered 2021-12-17: 10 mg via INTRAVENOUS
  Filled 2021-12-17: qty 1

## 2021-12-17 MED ORDER — ALLOPURINOL 100 MG PO TABS: 200.0000 mg | ORAL_TABLET | Freq: Two times a day (BID) | ORAL | 0 refills | Status: DC

## 2021-12-17 MED ORDER — COLCHICINE 0.6 MG PO TABS: 0.6000 mg | ORAL_TABLET | Freq: Two times a day (BID) | ORAL | 0 refills | Status: DC

## 2021-12-17 MED ORDER — MORPHINE SULFATE (PF) 4 MG/ML IV SOLN
4.0000 mg | Freq: Once | INTRAVENOUS | Status: AC
  Administered 2021-12-17: 4 mg via INTRAVENOUS
  Filled 2021-12-17: qty 1

## 2021-12-17 MED ORDER — TRAMADOL HCL 50 MG PO TABS
50.0000 mg | ORAL_TABLET | Freq: Four times a day (QID) | ORAL | 0 refills | Status: DC | PRN
Start: 2021-12-17 — End: 2022-10-06

## 2021-12-17 MED ORDER — PREDNISONE 10 MG (21) PO TBPK: ORAL_TABLET | ORAL | 0 refills | Status: DC

## 2021-12-17 MED ORDER — ONDANSETRON HCL 4 MG/2ML IJ SOLN
4.0000 mg | Freq: Once | INTRAMUSCULAR | Status: AC
  Administered 2021-12-17: 4 mg via INTRAVENOUS
  Filled 2021-12-17: qty 2

## 2021-12-17 MED ORDER — METFORMIN HCL 500 MG PO TABS: 500.0000 mg | ORAL_TABLET | Freq: Two times a day (BID) | ORAL | 0 refills | Status: DC

## 2021-12-17 NOTE — ED Provider Notes (Signed)
[facility] Provider Note    Event Date/Time   First MD Initiated Contact with Patient 12/17/21 0825     (approximate)   History   Joint Pain   HPI  Roger [name] is a 66 y.o. male complaining of bilateral leg pain.  Thinks it might be a gout flare but has been ongoing for about 2 months.  Patient has not been taking his regular medications for about 2 months.  Has history of an MI and diabetes.  History of gout.  He denies chest pain or shortness of breath.  States he does have some difficulty breathing when he lies down.  Some fever and chills recently.  No vomiting or diarrhea.      Physical Exam   Triage Vital Signs: ED Triage Vitals  Enc Vitals Group     BP 12/17/21 0818 (!) 164/99     Pulse Rate 12/17/21 0818 80     Resp 12/17/21 0818 20     Temp 12/17/21 0818 98.2 F (36.8 C)     Temp Source 12/17/21 0818 Oral     SpO2 12/17/21 0818 98 %     Weight 12/17/21 0807 230 lb (104.3 kg)     Height 12/17/21 0807 5\' 10"  (1.778 m)     Head Circumference --      Peak Flow --      Pain Score 12/17/21 0807 10     Pain Loc --      Pain Edu? --      Excl. in [place]? --     Most recent vital signs: Vitals:   12/17/21 0818  BP: (!) 164/99  Pulse: 80  Resp: 20  Temp: 98.2 F (36.8 C)  SpO2: 98%     General: Awake, no distress.   CV:  Good peripheral perfusion. regular rate and  rhythm Resp:  Normal effort. Lungs CTA Abd:  No distention.   Other:  Extremities are warm and slightly red, right knee is tender, left foot is tender, 2+ edema noted in the lower extremities, neurovascular is intact   ED Results / Procedures / Treatments   Labs (all labs ordered are listed, but only abnormal results are displayed) Labs Reviewed  CBC WITH DIFFERENTIAL/PLATELET - Abnormal; Notable for the following components:      Result Value   RBC 3.59 (*)    Hemoglobin 12.2 (*)    HCT 36.3 (*)    MCV 101.1 (*)    All other components within normal limits   COMPREHENSIVE METABOLIC PANEL - Abnormal; Notable for the following components:   Glucose, Bld 109 (*)    Creatinine, Ser 1.48 (*)    Albumin 2.9 (*)    GFR, Estimated 53 (*)    All other components within normal limits  URIC ACID - Abnormal; Notable for the following components:   Uric Acid, Serum 9.2 (*)    All other components within normal limits  RESP PANEL BY RT-PCR (FLU A&B, COVID) ARPGX2  LACTIC ACID, PLASMA  BRAIN NATRIURETIC PEPTIDE  TROPONIN I (HIGH SENSITIVITY)  TROPONIN I (HIGH SENSITIVITY)     EKG     RADIOLOGY  X-ray of the right knee, left foot and left tib-fib   PROCEDURES:   Procedures   MEDICATIONS ORDERED IN ED: Medications  morphine 4 MG/ML injection 4 mg (4 mg Intravenous Given 12/17/21 1029)  ondansetron (ZOFRAN) injection 4 mg (4 mg Intravenous Given 12/17/21 1029)  sodium chloride 0.9 % bolus 500 mL (  500 mLs Intravenous New Bag/Given 12/17/21 1246)  dexamethasone (DECADRON) injection 10 mg (10 mg Intravenous Given 12/17/21 1248)     IMPRESSION / MDM / ASSESSMENT AND PLAN / ED COURSE  I reviewed the triage vital signs and the nursing notes.                              Differential diagnosis includes, but is not limited to, hypokalemia, hyponatremia, gout, influenza/COVID, CHF  Labs and imaging ordered.  X-ray of the left foot does show a bony erosion.  This was reviewed by me.  The radiologist states that this is a result of gout. The x-ray of the left tib-fib was reviewed by me and is normal.  Also confirmed by radiology as normal X-ray of the right knee shows a joint effusion.  Read by radiology  Labs are reassuring, see BC is normal, comprehensive metabolic panel does show a decrease in his kidney function but it is mild, most likely due to him not being compliant with his medications.  Troponin is normal, respiratory panel is negative, BNP is negative for CHF, his lactic acid is normal and does not show any signs of sepsis, uric acid  is elevated at 9.2 showing gout.  I did explain all this to the patient.  He does not have hypokalemia which could be causing muscle cramps and pain.  He does not have hyponatremia.  He does have gout.  He does not have influenza covid or CHF.  Did give him reassurance that his heart enzymes are all normal.  He does need to reestablish himself with a primary care provider.  He was given refills on his regular medications.  Encouraged to take them regularly as previously instructed.  He was also given a prescription for Sterapred, colchicine and pain medication.  This is for gout flare.  Patient is in agreement with the treatment plan.  He was discharged in stable condition.      FINAL CLINICAL IMPRESSION(S) / ED DIAGNOSES   Final diagnoses:  Idiopathic gout, unspecified chronicity, unspecified site  Primary hypertension  Medication refill     Rx / DC Orders   ED Discharge Orders          Ordered    allopurinol (ZYLOPRIM) 100 MG tablet  2 times daily        12/17/21 1301    aspirin EC 81 MG tablet  Daily        12/17/21 1301    colchicine 0.6 MG tablet  2 times daily        12/17/21 1301    isosorbide mononitrate (IMDUR) 30 MG 24 hr tablet        12/17/21 1301    metFORMIN (GLUCOPHAGE) 500 MG tablet  2 times daily with meals        12/17/21 1301    metoprolol tartrate (LOPRESSOR) 25 MG tablet  2 times daily        12/17/21 1301    rosuvastatin (CRESTOR) 10 MG tablet  Daily        12/17/21 1301    predniSONE (STERAPRED UNI-PAK 21 TAB) 10 MG (21) TBPK tablet        12/17/21 1301    traMADol (ULTRAM) 50 MG tablet  Every 6 hours PRN        12/17/21 1301             Note:  This document was prepared using Dragon  voice recognition software and may include unintentional dictation errors.    Versie Starks, PA-C 12/17/21 1306    Delman Kitten, MD 12/20/21 [phone]

## 2021-12-17 NOTE — ED Triage Notes (Signed)
Pt reports has gout and takes meds for it but is having a gout flare up and can't get it under control. Pt reports soreness in both legs and knees.

## 2021-12-17 NOTE — Discharge Instructions (Addendum)
Follow-up with the PCP.  Please call for appointment. Take the medications as prescribed I only gave you 1 blood pressure medication as your blood pressure seems to be normal. You do have a decrease in your kidney function.  This is very mild at this time but you do need to see a PCP to have this monitored. Return emergency department worsening

## 2022-02-06 ENCOUNTER — Ambulatory Visit: Payer: 59 | Admitting: Internal Medicine

## 2022-02-15 ENCOUNTER — Ambulatory Visit: Payer: 59 | Admitting: Adult Health

## 2022-02-23 ENCOUNTER — Other Ambulatory Visit: Payer: Self-pay

## 2022-02-23 ENCOUNTER — Emergency Department: Payer: 59

## 2022-02-23 ENCOUNTER — Emergency Department
Admission: EM | Admit: 2022-02-23 | Discharge: 2022-02-23 | Disposition: A | Discharge status: Home / Self Care | Payer: 59 | Attending: Emergency Medicine | Admitting: Emergency Medicine

## 2022-02-23 DIAGNOSIS — E119 Type 2 diabetes mellitus without complications: Secondary | ICD-10-CM | POA: Insufficient documentation

## 2022-02-23 DIAGNOSIS — R7401 Elevation of levels of liver transaminase levels: Secondary | ICD-10-CM | POA: Insufficient documentation

## 2022-02-23 DIAGNOSIS — Z79899 Other long term (current) drug therapy: Secondary | ICD-10-CM | POA: Insufficient documentation

## 2022-02-23 DIAGNOSIS — R748 Abnormal levels of other serum enzymes: Secondary | ICD-10-CM | POA: Diagnosis present

## 2022-02-23 DIAGNOSIS — I10 Essential (primary) hypertension: Secondary | ICD-10-CM | POA: Insufficient documentation

## 2022-02-23 DIAGNOSIS — K8020 Calculus of gallbladder without cholecystitis without obstruction: Secondary | ICD-10-CM | POA: Diagnosis not present

## 2022-02-23 DIAGNOSIS — Z7982 Long term (current) use of aspirin: Secondary | ICD-10-CM | POA: Diagnosis not present

## 2022-02-23 DIAGNOSIS — Z7984 Long term (current) use of oral hypoglycemic drugs: Secondary | ICD-10-CM | POA: Diagnosis not present

## 2022-02-23 DIAGNOSIS — K760 Fatty (change of) liver, not elsewhere classified: Secondary | ICD-10-CM | POA: Insufficient documentation

## 2022-02-23 LAB — LIPASE, BLOOD: Lipase: 24 U/L (ref 11–51)

## 2022-02-23 LAB — COMPREHENSIVE METABOLIC PANEL
ALT: 256 U/L — ABNORMAL HIGH (ref 0–44)
AST: 498 U/L — ABNORMAL HIGH (ref 15–41)
Albumin: 3.9 g/dL (ref 3.5–5.0)
Alkaline Phosphatase: 144 U/L — ABNORMAL HIGH (ref 38–126)
Anion gap: 16 — ABNORMAL HIGH (ref 5–15)
BUN: 30 mg/dL — ABNORMAL HIGH (ref 8–23)
CO2: 22 mmol/L (ref 22–32)
Calcium: 9.4 mg/dL (ref 8.9–10.3)
Chloride: 94 mmol/L — ABNORMAL LOW (ref 98–111)
Creatinine, Ser: 1.72 mg/dL — ABNORMAL HIGH (ref 0.61–1.24)
GFR, Estimated: 44 mL/min — ABNORMAL LOW (ref >60)
Glucose, Bld: 174 mg/dL — ABNORMAL HIGH (ref 70–99)
Potassium: 3.7 mmol/L (ref 3.5–5.1)
Sodium: 132 mmol/L — ABNORMAL LOW (ref 135–145)
Total Bilirubin: 1.1 mg/dL (ref 0.3–1.2)
Total Protein: 8.5 g/dL — ABNORMAL HIGH (ref 6.5–8.1)

## 2022-02-23 LAB — CBC
HCT: 34.8 % — ABNORMAL LOW (ref 39.0–52.0)
Hemoglobin: 11.5 g/dL — ABNORMAL LOW (ref 13.0–17.0)
MCH: 32.7 pg (ref 26.0–34.0)
MCHC: 33 g/dL (ref 30.0–36.0)
MCV: 98.9 fL (ref 80.0–100.0)
Platelets: 169 10*3/uL (ref 150–400)
RBC: 3.52 MIL/uL — ABNORMAL LOW (ref 4.22–5.81)
RDW: 13.1 % (ref 11.5–15.5)
WBC: 7.4 10*3/uL (ref 4.0–10.5)
nRBC: 0 % (ref 0.0–0.2)

## 2022-02-23 LAB — ACETAMINOPHEN LEVEL: Acetaminophen (Tylenol), Serum: <10 ug/mL — ABNORMAL LOW (ref 10–30)

## 2022-02-23 NOTE — Discharge Instructions (Signed)
Please call general surgeon to schedule follow-up appointment for cholelithiasis.  Please try to refrain from excessive alcohol use as well as use of Tylenol.  Both Tylenol and alcohol places stress on the liver and can cause elevated liver enzymes.  Return to the ER for any fevers increasing pain, vomiting, worsening symptoms or urgent changes in your health ?

## 2022-02-23 NOTE — ED Provider Notes (Signed)
?[place] EMERGENCY DEPARTMENT ?Provider Note ? ? ?CSN: [id number] ?Arrival date & time: 02/23/22  1527 ? ?  ? ?History ? ?Chief Complaint  ?Patient presents with  ? abnormal labs  ? ? ?Roger [name] is a 66 y.o. male presents to the emergency department for evaluation of elevated liver enzymes.  Patient states he is asymptomatic.  He has a history of gout, hypertension, diabetes and also drinks alcohol routinely.  He states he drinks at least 1/5 of liquor a day but over the last week he has had severe gout flareup and has been taking a lot of Tylenol, states at least 20 to 30 unknown sized pills as well as drinking extra alcohol to help with the pain.  Yesterday he saw his rheumatologist, lab work was obtained and patient was found to have elevated liver enzymes of AST greater than 1000 and ALT 275.  Today, patient states he is feeling well with no abdominal pain nausea or vomiting.  No fevers/confusion or mental status changes.  No headaches chest pain or shortness of breath.  He denies any pain after eating. ? ?HPI ? ?  ? ?Home Medications ?Prior to Admission medications   ?Medication Sig Start Date End Date Taking? Authorizing Provider  ?allopurinol (ZYLOPRIM) 100 MG tablet Take 2 tablets (200 mg total) by mouth 2 (two) times daily. 12/17/21 01/16/22  Versie Starks, PA-C  ?aspirin EC 81 MG tablet Take 1 tablet (81 mg total) by mouth daily. 12/17/21   Caryn Section Linden Dolin, PA-C  ?colchicine 0.6 MG tablet Take 1 tablet (0.6 mg total) by mouth 2 (two) times daily. 12/17/21 01/16/22  Caryn Section Linden Dolin, PA-C  ?isosorbide mononitrate (IMDUR) 30 MG 24 hr tablet TAKE 1 TABLET(30 MG) BY MOUTH DAILY 12/17/21   Caryn Section Linden Dolin, PA-C  ?metFORMIN (GLUCOPHAGE) 500 MG tablet Take 1 tablet (500 mg total) by mouth 2 (two) times daily with a meal. 12/17/21   Fisher, Linden Dolin, PA-C  ?metoprolol tartrate (LOPRESSOR) 25 MG tablet Take 1 tablet (25 mg total) by mouth 2 (two) times daily. 12/17/21   Fisher, Linden Dolin, PA-C   ?predniSONE (STERAPRED UNI-PAK 21 TAB) 10 MG (21) TBPK tablet Take 6 pills on day one then decrease by 1 pill each day 12/17/21   Versie Starks, PA-C  ?rosuvastatin (CRESTOR) 10 MG tablet Take 1 tablet (10 mg total) by mouth daily. 12/17/21   Caryn Section Linden Dolin, PA-C  ?traMADol (ULTRAM) 50 MG tablet Take 1 tablet (50 mg total) by mouth every 6 (six) hours as needed. 12/17/21   Caryn Section Linden Dolin, PA-C  ?triamterene-hydrochlorothiazide (MAXZIDE-25) 37.5-25 MG tablet Take 1 tablet by mouth daily. 07/03/20   Coral Spikes, DO  ?lisinopril (ZESTRIL) 20 MG tablet TAKE 1 TABLET(20 MG) BY MOUTH DAILY 05/13/19 06/25/19  Leone Haven, MD  ?   ? ?Allergies    ?Albuterol and Mucinex [guaifenesin er]   ? ?Review of Systems   ?Review of Systems ? ?Physical Exam ?Updated Vital Signs ?BP 133/85 (BP Location: Left Arm)   Pulse 83   Temp 98 ?F (36.7 ?C) (Oral)   Resp 18   SpO2 95%  ?Physical Exam ?Constitutional:   ?   Appearance: He is well-developed.  ?HENT:  ?   Head: Normocephalic and atraumatic.  ?Eyes:  ?   Conjunctiva/sclera: Conjunctivae normal.  ?   Pupils: Pupils are equal, round, and reactive to light.  ?Cardiovascular:  ?   Rate and Rhythm: Normal rate and regular rhythm.  ?  Pulses: Normal pulses.  ?   Heart sounds: Normal heart sounds.  ?Pulmonary:  ?   Effort: Pulmonary effort is normal. No respiratory distress.  ?   Breath sounds: Normal breath sounds. No wheezing or rales.  ?Chest:  ?   Chest wall: No tenderness.  ?Abdominal:  ?   General: Bowel sounds are normal. There is no distension.  ?   Palpations: Abdomen is soft.  ?   Tenderness: There is no abdominal tenderness. There is no right CVA tenderness, left CVA tenderness, guarding or rebound.  ?Musculoskeletal:     ?   General: No tenderness. Normal range of motion.  ?   Cervical back: Normal range of motion and neck supple.  ?Skin: ?   General: Skin is warm and dry.  ?Neurological:  ?   General: No focal deficit present.  ?   Mental Status: He is alert and  oriented to person, place, and time. Mental status is at baseline.  ?   Cranial Nerves: No cranial nerve deficit.  ?   Motor: No weakness.  ?   Gait: Gait normal.  ?Psychiatric:     ?   Behavior: Behavior normal.     ?   Thought Content: Thought content normal.     ?   Judgment: Judgment normal.  ? ? ?ED Results / Procedures / Treatments   ?Labs ?(all labs ordered are listed, but only abnormal results are displayed) ?Labs Reviewed  ?COMPREHENSIVE METABOLIC PANEL - Abnormal; Notable for the following components:  ?    Result Value  ? Sodium 132 (*)   ? Chloride 94 (*)   ? Glucose, Bld 174 (*)   ? BUN 30 (*)   ? Creatinine, Ser 1.72 (*)   ? Total Protein 8.5 (*)   ? AST 498 (*)   ? ALT 256 (*)   ? Alkaline Phosphatase 144 (*)   ? GFR, Estimated 44 (*)   ? Anion gap 16 (*)   ? All other components within normal limits  ?CBC - Abnormal; Notable for the following components:  ? RBC 3.52 (*)   ? Hemoglobin 11.5 (*)   ? HCT 34.8 (*)   ? All other components within normal limits  ?ACETAMINOPHEN LEVEL - Abnormal; Notable for the following components:  ? Acetaminophen (Tylenol), Serum <10 (*)   ? All other components within normal limits  ?LIPASE, BLOOD  ? ? ?EKG ?None ? ?Radiology ?US ABDOMEN LIMITED RUQ (LIVER/GB) ? ?Result Date: 02/23/2022 ?CLINICAL DATA:  Transaminitis. EXAM: ULTRASOUND ABDOMEN LIMITED RIGHT UPPER QUADRANT COMPARISON:  None. FINDINGS: Gallbladder: Gallbladder sludge and gallstones within the gallbladder lumen. No gallbladder wall thickening or pericholecystic fluid. No sonographic Murphy sign noted by sonographer. Common bile duct: Diameter: 5 mm. Liver: No focal lesion identified. Increased parenchymal echogenicity. Portal vein is patent on color Doppler imaging with normal direction of blood flow towards the liver. Other: None. IMPRESSION: 1. Gallbladder sludge and cholelithiasis. 2. Hepatic steatosis. Please note limited evaluation for focal hepatic masses in a patient with hepatic steatosis due to  decreased penetration of the acoustic ultrasound waves. Electronically Signed   By: Iven Finn M.D.   On: 02/23/2022 18:01   ? ?Procedures ?Procedures  ? ? ?Medications Ordered in ED ?Medications - No data to display ? ?ED Course/ Medical Decision Making/ A&P ?  ?                        ?Medical Decision Making ?  Amount and/or Complexity of Data Reviewed ?Labs: ordered. ? ?66 year old male with elevated liver enzymes 1 day ago, PCP told him to come to the ER today for further evaluation.  Today significant reduction in his AST but still quite elevated along with a reduction in his ALT.  His bilirubin is within normal limits with no leukocytosis.  Ultrasound obtained of the gallbladder showing sludge with cholelithiasis.  Also noted hepatic steatosis.  Patient currently asymptomatic with no pain and normal vital signs.  No leukocytosis.  Will discharge patient home and have patient call general surgery to schedule follow-up to discuss cholelithiasis.  He understands signs and symptoms return to the ER for.  Educated him on excessive alcohol drinking and recommend he cut back as well as avoid taking Tylenol.  Patient understands signs and symptoms return to the ER for. ?Final Clinical Impression(s) / ED Diagnoses ?Final diagnoses:  ?Elevated liver enzymes  ?Calculus of gallbladder without cholecystitis without obstruction  ? ? ?Rx / DC Orders ?ED Discharge Orders   ? ? None  ? ?  ? ? ?  ?Duanne Guess, PA-C ?02/23/22 2040 ? ?  ?Rudene Re, MD ?02/28/22 2342 ? ?

## 2022-02-23 NOTE — ED Triage Notes (Signed)
Pt comes with c/o abnormal labs. Pt states his PCP sent him over for his liver enzymes are elevated. Pt denies any pain. ? ?Pt states heavy drinker for several years. Pt drinks beer and liquor. Pt states he has drank today. ?

## 2022-03-12 ENCOUNTER — Other Ambulatory Visit
Admission: RE | Admit: 2022-03-12 | Discharge: 2022-03-12 | Disposition: A | Discharge status: Home / Self Care | Payer: 59 | Attending: Surgery | Admitting: Surgery

## 2022-03-12 ENCOUNTER — Ambulatory Visit: Payer: 59 | Admitting: Surgery

## 2022-03-12 ENCOUNTER — Encounter: Payer: Self-pay | Admitting: Surgery

## 2022-03-12 VITALS — BP 152/101 | HR 97 | Temp 97.9°F | Ht 70.0 in | Wt 218.0 lb

## 2022-03-12 DIAGNOSIS — R748 Abnormal levels of other serum enzymes: Secondary | ICD-10-CM

## 2022-03-12 DIAGNOSIS — K8020 Calculus of gallbladder without cholecystitis without obstruction: Secondary | ICD-10-CM

## 2022-03-12 LAB — CBC
HCT: 35.2 % — ABNORMAL LOW (ref 39.0–52.0)
Hemoglobin: 11.7 g/dL — ABNORMAL LOW (ref 13.0–17.0)
MCH: 32.4 pg (ref 26.0–34.0)
MCHC: 33.2 g/dL (ref 30.0–36.0)
MCV: 97.5 fL (ref 80.0–100.0)
Platelets: 170 10*3/uL (ref 150–400)
RBC: 3.61 MIL/uL — ABNORMAL LOW (ref 4.22–5.81)
RDW: 13 % (ref 11.5–15.5)
WBC: 13.9 10*3/uL — ABNORMAL HIGH (ref 4.0–10.5)
nRBC: 0 % (ref 0.0–0.2)

## 2022-03-12 LAB — COMPREHENSIVE METABOLIC PANEL
ALT: 27 U/L (ref 0–44)
AST: 27 U/L (ref 15–41)
Albumin: 3.6 g/dL (ref 3.5–5.0)
Alkaline Phosphatase: 218 U/L — ABNORMAL HIGH (ref 38–126)
Anion gap: 11 (ref 5–15)
BUN: 16 mg/dL (ref 8–23)
CO2: 25 mmol/L (ref 22–32)
Calcium: 9.2 mg/dL (ref 8.9–10.3)
Chloride: 102 mmol/L (ref 98–111)
Creatinine, Ser: 1.13 mg/dL (ref 0.61–1.24)
GFR, Estimated: >60 mL/min (ref >60)
Glucose, Bld: 119 mg/dL — ABNORMAL HIGH (ref 70–99)
Potassium: 3.8 mmol/L (ref 3.5–5.1)
Sodium: 138 mmol/L (ref 135–145)
Total Bilirubin: 0.7 mg/dL (ref 0.3–1.2)
Total Protein: 7.3 g/dL (ref 6.5–8.1)

## 2022-03-12 NOTE — Patient Instructions (Addendum)
Please go to [facility] to have your labs drawn at your earliest convenience. ? ? ?If you have any concerns or questions, please feel free to call our office. See follow up appointment below.  ? ? ?Cholelithiasis ? ?Cholelithiasis happens when gallstones form in the gallbladder. The gallbladder stores bile. Bile is a fluid that helps digest fats. Bile can harden and form into gallstones. If they cause a blockage, they can cause pain (gallbladder attack). ?What are the causes? ?This condition may be caused by: ?Some blood diseases, such as sickle cell anemia. ?Too much of a fat-like substance (cholesterol) in your bile. ?Not enough bile salts in your bile. These salts help the body absorb and digest fats. ?The gallbladder not emptying fully or often enough. This is common in pregnant women. ?What increases the risk? ?The following factors may make you more likely to develop this condition: ?Being male. ?Being pregnant many times. ?Eating a lot of fried foods, fat, and refined carbs (refined carbohydrates). ?Being very overweight (obese). ?Being older than age 46. ?Using medicines with male hormones in them for a long time. ?Losing weight fast. ?Having gallstones in your family. ?Having some health problems, such as diabetes, Crohn's disease, or liver disease. ?What are the signs or symptoms? ?Often, there may be gallstones but no symptoms. These gallstones are called silent gallstones. If a gallstone causes a blockage, you may get sudden pain. The pain: ?Can be in the upper right part of your belly (abdomen). ?Normally comes at night or after you eat. ?Can last an hour or more. ?Can spread to your right shoulder, back, or chest. ?Can feel like discomfort, burning, or fullness in the upper part of your belly (indigestion). ?If the blockage lasts more than a few hours, you can get an infection or swelling. You may: ?Feel like you may vomit. ?Vomit. ?Feel bloated. ?Have belly pain for 5 hours or more. ?Feel  tender in your belly, often in the upper right part and under your ribs. ?Have fever or chills. ?Have skin or the white parts of your eyes turn yellow (jaundice). ?Have dark pee (urine) or pale poop (stool). ?How is this treated? ?Treatment for this condition depends on how bad you feel. If you have symptoms, you may need: ?Home care, if symptoms are not very bad. ?Do not eat for 12-24 hours. Drink only water and clear liquids. ?Start to eat simple or clear foods after 1 or 2 days. Try broths and crackers. ?You may need medicines for pain or stomach upset or both. ?If you have an infection, you will need antibiotics. ?A hospital stay, if you have very bad pain or a very bad infection. ?Surgery to remove your gallbladder. You may need this if: ?Gallstones keep coming back. ?You have very bad symptoms. ?Medicines to break up gallstones. Medicines: ?Are best for small gallstones. ?May be used for up to 6-12 months. ?A procedure to find and take out gallstones or to break up gallstones. ?Follow these instructions at home: ?Medicines ?Take over-the-counter and prescription medicines only as told by your doctor. ?If you were prescribed an antibiotic medicine, take it as told by your doctor. Do not stop taking the antibiotic even if you start to feel better. ?Ask your doctor if the medicine prescribed to you requires you to avoid driving or using machinery. ?Eating and drinking ?Drink enough fluid to keep your urine pale yellow. Drink water or clear fluids. This is important when you have pain. ?Eat healthy foods. Choose: ?Fewer fatty  foods, such as fried foods. ?Fewer refined carbs. Avoid breads and grains that are highly processed, such as white bread and white rice. Choose whole grains, such as whole-wheat bread and Coffie rice. ?More fiber. Almonds, fresh fruit, and beans are healthy sources. ?General instructions ?Keep a healthy weight. ?Keep all follow-up visits as told by your doctor. This is important. ?Where to  find more information ?[employer] of Diabetes and Digestive and Kidney Diseases: [email or web address] ?Contact a doctor if: ?You have sudden pain in the upper right part of your belly. Pain might spread to your right shoulder, back, or chest. ?You have been diagnosed with gallstones that have no symptoms and you get: ?Belly pain. ?Discomfort, burning, or fullness in the upper part of your abdomen. ?You have dark urine or pale stools. ?Get help right away if: ?You have sudden pain in the upper right part of your abdomen, and the pain lasts more than 2 hours. ?You have pain in your abdomen, and: ?It lasts more than 5 hours. ?It keeps getting worse. ?You have a fever or chills. ?You keep feeling like you may vomit. ?You keep vomiting. ?Your skin or the white parts of your eyes turn yellow. ?Summary ?Cholelithiasis happens when gallstones form in the gallbladder. ?This condition may be caused by a blood disease, too much of a fat-like substance in the bile, or not enough bile salts in bile. ?Treatment for this condition depends on how bad you feel. ?If you have symptoms, do not eat or drink. You may need medicines. You may need a hospital stay for very bad pain or a very bad infection. ?You may need surgery if gallstones keep coming back or if you have very bad symptoms. ?This information is not intended to replace advice given to you by your health care provider. Make sure you discuss any questions you have with your health care provider. ?

## 2022-03-13 NOTE — Progress Notes (Signed)
?Surgical Consultation ? ?03/13/2022 ? ?Roger [name] is an 66 y.o. male.  ? ?Chief Complaint  ?Patient presents with  ? New Patient (Initial Visit)  ?  Gallstones  ? ? ? ?HPI: 66 year old male seen in consultation at the request of the Mr. Baiz Human.  He recently was evaluated in the emergency room to have weeks ago for abnormal liver enzymes.  Patient does have a history of hypertension diabetes as well as gout.  He does admit of alcohol drinking liquor and beer.  He also reports that over the last couple weeks he used to Tylenol and significant amounts of allopurinol due to gout flare of his gout.patient was found to have elevated liver enzymes of AST greater than 1000 and ALT 275.  His creatinine is 1.72 and his alk phosphatase was 144.  This LFTs have significantly worsened as compared to labs from a couple months ago.  He did have an ultrasound that I have personally reviewed showing evidence of hepatic steatosis and cholelithiasis.  The common bile duct was normal.  No evidence of cholecystitis. ?The patient denies ? today, patient states he is feeling well with no abdominal pain nausea or vomiting.  No fevers/confusion or mental status changes.  No headaches chest pain or shortness of breath.  He denies any pain after eating. ? ? ?Past Medical History:  ?Diagnosis Date  ? Chest pain   ? a. 04/2016 MV: small, mild apical defect c/w apical ischemia, EF 55-65%-->Low risk study.  ? Diabetes mellitus without complication ([place])   ? Gout   ? Hematochezia   ? History of echocardiogram   ? a. 04/2016 Echo: EF 55-60%, mild LVH, no rwma, midly dil LA.  ? History of heart attack   ? Hypertension   ? Morbid obesity ([place])   ? ? ?Past Surgical History:  ?Procedure Laterality Date  ? COLONOSCOPY WITH PROPOFOL N/A 08/05/2018  ? Procedure: COLONOSCOPY WITH PROPOFOL;  Surgeon: Lin Landsman, MD;  [facility] END[facility]COPY;  Service: Gastroenterology;  Laterality: N/A;  ? thumb surgery    ? ? ?Family History  ?Problem  Relation Age of Onset  ? Heart Problems Mother   ? Breast cancer Mother   ? Mental illness Father   ? Heart disease Father   ? Stroke Father   ? Diabetes Father   ? Cancer Father   ? Alcohol abuse Father   ? Bipolar disorder Father   ? Heart disease Other   ? Hypertension Other   ? Diabetes Other   ? ? ?Social History:  reports that he quit smoking about 29 years ago. His smoking use included cigarettes. He has a 6.25 pack-year smoking history. He has never used smokeless tobacco. He reports current alcohol use of about 42.0 standard drinks per week. He reports that he does not use drugs. ? ?Allergies:  ?Allergies  ?Allergen Reactions  ? Albuterol Nausea And Vomiting  ? Mucinex [Guaifenesin Er] Rash  ? ? ?Medications reviewed. ? ? ? ? ?ROS ?Full ROS performed and is otherwise negative other than what is stated in the HPI ? ? ? ?BP (!) 152/101   Pulse 97   Temp 97.9 ?F (36.6 ?C) (Oral)   Ht $R'5\' 10"'uS$  (1.778 m)   Wt 218 lb (98.9 kg)   SpO2 97%   BMI 31.28 kg/m?  ? ?Physical Exam ?Vitals and nursing note reviewed. Exam conducted with a chaperone present.  ?Constitutional:   ?   General: He is not in acute distress. ?  Appearance: Normal appearance. He is not toxic-appearing.  ?Cardiovascular:  ?   Rate and Rhythm: Normal rate.  ?   Heart sounds: No murmur heard. ?  No friction rub.  ?Pulmonary:  ?   Effort: Pulmonary effort is normal. No respiratory distress.  ?   Breath sounds: Normal breath sounds. No stridor. No wheezing.  ?Abdominal:  ?   General: Abdomen is flat. There is no distension.  ?   Palpations: Abdomen is soft. There is no mass.  ?   Tenderness: There is no abdominal tenderness. There is no guarding or rebound.  ?   Hernia: No hernia is present.  ?Musculoskeletal:     ?   General: No swelling or tenderness. Normal range of motion.  ?   Cervical back: Normal range of motion and neck supple. No rigidity or tenderness.  ?Lymphadenopathy:  ?   Cervical: No cervical adenopathy.  ?Skin: ?   General: Skin is  warm and dry.  ?   Capillary Refill: Capillary refill takes less than 2 seconds.  ?Neurological:  ?   General: No focal deficit present.  ?   Mental Status: He is alert and oriented to person, place, and time.  ?Psychiatric:     ?   Mood and Affect: Mood normal.     ?   Behavior: Behavior normal.     ?   Thought Content: Thought content normal.     ?   Judgment: Judgment normal.  ? ? ? ?Assessment/Plan: ?66 year old male with history of gallstones and elevated liver enzymes.  He does have active EtOH consumption and was taking several medications to include Tylenol and allopurinol.  I will like to recheck liver enzymes and see what were going from days.  At this point in time I am not certain whether or not he has symptoms related to his gallstones.  I would like to make sure that he is going in the right direction from a liver perspective that he is not developing worsening cirrhosis or complications related to liver injury.  I would like to hold off any surgical intervention at this time and I will like to follow him closely.  He may need referral for hepatology regarding further work-up.  I encouraged him to stop drinking and counsel him about the dangers of this behavior.  He understands. ?Please note that I spent greater than 45 minutes's encounter including coordination of his care, placing orders, counseling the patient and performing appropriate documentation ?A copy of this report was sent to the referring provider ? ? ?Caroleen Hamman, MD FACS ?General Surgeon ? ? ?

## 2022-04-02 ENCOUNTER — Ambulatory Visit: Payer: 59 | Admitting: Surgery

## 2022-04-18 ENCOUNTER — Other Ambulatory Visit
Admission: RE | Admit: 2022-04-18 | Discharge: 2022-04-18 | Disposition: A | Discharge status: Home / Self Care | Payer: 59 | Attending: Internal Medicine | Admitting: Internal Medicine

## 2022-04-18 DIAGNOSIS — R748 Abnormal levels of other serum enzymes: Secondary | ICD-10-CM | POA: Insufficient documentation

## 2022-04-18 LAB — COMPREHENSIVE METABOLIC PANEL
ALT: 18 U/L (ref 0–44)
AST: 32 U/L (ref 15–41)
Albumin: 3.5 g/dL (ref 3.5–5.0)
Alkaline Phosphatase: 193 U/L — ABNORMAL HIGH (ref 38–126)
Anion gap: 10 (ref 5–15)
BUN: 24 mg/dL — ABNORMAL HIGH (ref 8–23)
CO2: 26 mmol/L (ref 22–32)
Calcium: 9.2 mg/dL (ref 8.9–10.3)
Chloride: 102 mmol/L (ref 98–111)
Creatinine, Ser: 1.32 mg/dL — ABNORMAL HIGH (ref 0.61–1.24)
GFR, Estimated: >60 mL/min (ref >60)
Glucose, Bld: 181 mg/dL — ABNORMAL HIGH (ref 70–99)
Potassium: 3.8 mmol/L (ref 3.5–5.1)
Sodium: 138 mmol/L (ref 135–145)
Total Bilirubin: 1 mg/dL (ref 0.3–1.2)
Total Protein: 7.5 g/dL (ref 6.5–8.1)

## 2022-08-27 ENCOUNTER — Other Ambulatory Visit (HOSPITAL_COMMUNITY): Payer: Self-pay | Admitting: Rheumatology

## 2022-08-27 ENCOUNTER — Other Ambulatory Visit: Payer: Self-pay | Admitting: Rheumatology

## 2022-08-27 DIAGNOSIS — R748 Abnormal levels of other serum enzymes: Secondary | ICD-10-CM

## 2022-09-08 ENCOUNTER — Other Ambulatory Visit: Payer: Self-pay

## 2022-09-08 ENCOUNTER — Emergency Department
Admission: EM | Admit: 2022-09-08 | Discharge: 2022-09-08 | Disposition: A | Discharge status: Home / Self Care | Payer: 59 | Attending: Emergency Medicine | Admitting: Emergency Medicine

## 2022-09-08 ENCOUNTER — Emergency Department: Payer: 59

## 2022-09-08 DIAGNOSIS — D72829 Elevated white blood cell count, unspecified: Secondary | ICD-10-CM | POA: Diagnosis not present

## 2022-09-08 DIAGNOSIS — I10 Essential (primary) hypertension: Secondary | ICD-10-CM | POA: Diagnosis not present

## 2022-09-08 DIAGNOSIS — R339 Retention of urine, unspecified: Secondary | ICD-10-CM | POA: Diagnosis not present

## 2022-09-08 DIAGNOSIS — M79604 Pain in right leg: Secondary | ICD-10-CM | POA: Diagnosis not present

## 2022-09-08 DIAGNOSIS — R109 Unspecified abdominal pain: Secondary | ICD-10-CM | POA: Diagnosis not present

## 2022-09-08 DIAGNOSIS — N429 Disorder of prostate, unspecified: Secondary | ICD-10-CM

## 2022-09-08 DIAGNOSIS — E119 Type 2 diabetes mellitus without complications: Secondary | ICD-10-CM | POA: Diagnosis not present

## 2022-09-08 DIAGNOSIS — M109 Gout, unspecified: Secondary | ICD-10-CM | POA: Diagnosis not present

## 2022-09-08 LAB — URINALYSIS, ROUTINE W REFLEX MICROSCOPIC
Bilirubin Urine: NEGATIVE
Glucose, UA: NEGATIVE mg/dL
Ketones, ur: 15 mg/dL — AB
Leukocytes,Ua: NEGATIVE
Nitrite: NEGATIVE
Specific Gravity, Urine: 1.015 (ref 1.005–1.030)
pH: 6.5 (ref 5.0–8.0)

## 2022-09-08 LAB — COMPREHENSIVE METABOLIC PANEL
ALT: 12 U/L (ref 0–44)
AST: 26 U/L (ref 15–41)
Albumin: 3.2 g/dL — ABNORMAL LOW (ref 3.5–5.0)
Alkaline Phosphatase: 411 U/L — ABNORMAL HIGH (ref 38–126)
Anion gap: 13 (ref 5–15)
BUN: 15 mg/dL (ref 8–23)
CO2: 24 mmol/L (ref 22–32)
Calcium: 8.3 mg/dL — ABNORMAL LOW (ref 8.9–10.3)
Chloride: 102 mmol/L (ref 98–111)
Creatinine, Ser: 1 mg/dL (ref 0.61–1.24)
GFR, Estimated: >60 mL/min (ref >60)
Glucose, Bld: 117 mg/dL — ABNORMAL HIGH (ref 70–99)
Potassium: 3.6 mmol/L (ref 3.5–5.1)
Sodium: 139 mmol/L (ref 135–145)
Total Bilirubin: 1 mg/dL (ref 0.3–1.2)
Total Protein: 6.6 g/dL (ref 6.5–8.1)

## 2022-09-08 LAB — CBC WITH DIFFERENTIAL/PLATELET
Abs Immature Granulocytes: 0.88 10*3/uL — ABNORMAL HIGH (ref 0.00–0.07)
Basophils Absolute: 0.1 10*3/uL (ref 0.0–0.1)
Basophils Relative: 1 %
Eosinophils Absolute: 0 10*3/uL (ref 0.0–0.5)
Eosinophils Relative: 0 %
HCT: 35.4 % — ABNORMAL LOW (ref 39.0–52.0)
Hemoglobin: 11.6 g/dL — ABNORMAL LOW (ref 13.0–17.0)
Immature Granulocytes: 5 %
Lymphocytes Relative: 19 %
Lymphs Abs: 3.4 10*3/uL (ref 0.7–4.0)
MCH: 33 pg (ref 26.0–34.0)
MCHC: 32.8 g/dL (ref 30.0–36.0)
MCV: 100.9 fL — ABNORMAL HIGH (ref 80.0–100.0)
Monocytes Absolute: 0.7 10*3/uL (ref 0.1–1.0)
Monocytes Relative: 4 %
Neutro Abs: 12.5 10*3/uL — ABNORMAL HIGH (ref 1.7–7.7)
Neutrophils Relative %: 71 %
Platelets: 128 10*3/uL — ABNORMAL LOW (ref 150–400)
RBC: 3.51 MIL/uL — ABNORMAL LOW (ref 4.22–5.81)
RDW: 13.2 % (ref 11.5–15.5)
WBC: 17.6 10*3/uL — ABNORMAL HIGH (ref 4.0–10.5)
nRBC: 0 % (ref 0.0–0.2)

## 2022-09-08 LAB — PSA: Prostatic Specific Antigen: >1500 ng/mL — ABNORMAL HIGH (ref 0.00–4.00)

## 2022-09-08 MED ORDER — COLCHICINE 0.6 MG PO TABS
1.2000 mg | ORAL_TABLET | Freq: Once | ORAL | Status: AC
  Administered 2022-09-08: 1.2 mg via ORAL
  Filled 2022-09-08: qty 2

## 2022-09-08 MED ORDER — IOHEXOL 300 MG/ML  SOLN
100.0000 mL | Freq: Once | INTRAMUSCULAR | Status: AC | PRN
  Administered 2022-09-08: 100 mL via INTRAVENOUS

## 2022-09-08 MED ORDER — IBUPROFEN 400 MG PO TABS: 400.0000 mg | ORAL_TABLET | Freq: Three times a day (TID) | ORAL | 0 refills | Status: AC | PRN

## 2022-09-08 MED ORDER — COLCHICINE 0.6 MG PO TABS
0.6000 mg | ORAL_TABLET | Freq: Once | ORAL | Status: AC
  Administered 2022-09-08: 0.6 mg via ORAL
  Filled 2022-09-08: qty 1

## 2022-09-08 MED ORDER — OXYCODONE HCL 5 MG PO TABS: 2.5000 mg | ORAL_TABLET | Freq: Four times a day (QID) | ORAL | 0 refills | Status: AC | PRN

## 2022-09-08 NOTE — ED Provider Notes (Signed)
-----------------------------------------   3:07 PM on 09/08/2022 -----------------------------------------  Blood pressure (!) 161/84, pulse 91, temperature 97.8 F (36.6 C), resp. rate 18, height '5\' 10"'$  (1.778 m), weight 99.8 kg, SpO2 93 %.  Assuming care from Dr. Jari Pigg.  In short, Roger [name] is a 65 y.o. male with a chief complaint of Urinary Retention .  Refer to the original H&P for additional details.  The current plan of care is to follow-up US results for DVT, if negative then patient appropriate for discharge home with Urology follow-up.  ----------------------------------------- 3:59 PM on 09/08/2022 ----------------------------------------- Lower extremity ultrasound is negative for DVT, patient reports pain improved on reassessment and is requesting to be discharged home.  He is appropriate for discharge with outpatient urology and PCP follow-up, was counseled to return to the ED for new or worsening symptoms.  Patient and family agree with plan.    Blake Divine, MD 09/08/22 [phone]

## 2022-09-08 NOTE — ED Triage Notes (Signed)
Pt arrives via EMS from home- c/o constipation and urinary retention- pt states that he can get some pee out, but just feels like he cannot empty his bladder- pt states he has not had a normal BM in a couple months, but did have a small BM this morning- pt also c/o pain in his R foot- pt has a history of gout

## 2022-09-08 NOTE — ED Provider Notes (Signed)
[facility] Provider Note    Event Date/Time   First MD Initiated Contact with Patient 09/08/22 [phone]     (approximate)   History   Urinary Retention   HPI  Roger [name] is a 66 y.o. male with gout, hypertension, diabetes with routine alcohol use who comes in with concerns for urinary retention.  Patient reports that he has a gout flare as well.  For his gout flare he reports pain on the top of his right foot as well as his left elbow.  He reports having gout here previously.  He reports being on prednisone but not being able to currently afford the colchicine so not taking that.  He denies any known fevers.  Denies any chest pain, shortness of breath  For the urinary retention he reports feeling like he is not getting all of his urine out and he reports some abdominal distention and feeling like he cannot have a bowel movement.  He is unclear when his last bowel movement was.  Physical Exam   Triage Vital Signs: ED Triage Vitals  Enc Vitals Group     BP 09/08/22 0825 (!) 161/84     Pulse Rate 09/08/22 0825 91     Resp 09/08/22 0825 18     Temp 09/08/22 0825 97.8 F (36.6 C)     Temp src --      SpO2 09/08/22 0825 93 %     Weight 09/08/22 0827 220 lb (99.8 kg)     Height 09/08/22 0827 '5\' 10"'$  (1.778 m)     Head Circumference --      Peak Flow --      Pain Score 09/08/22 0826 10     Pain Loc --      Pain Edu? --      Excl. in [place]? --     Most recent vital signs: Vitals:   09/08/22 0825  BP: (!) 161/84  Pulse: 91  Resp: 18  Temp: 97.8 F (36.6 C)  SpO2: 93%     General: Awake, no distress.  CV:  Good peripheral perfusion.  Resp:  Normal effort.  Patient up to the pulse ox and 98% Abd:  No distention.  Tender throughout. Other:  Good distal pulses in his feet.  No calf tenderness.  He is a little bit of soft tissue swelling on the right foot.  His ankle is nontender with range of motion of the ankle intact but he reports pain on the  midfoot.  No significant redness or warmth noted.  He does report a little bit of pain on his left elbow but range of motion is intact.   ED Results / Procedures / Treatments   Labs (all labs ordered are listed, but only abnormal results are displayed) Labs Reviewed  CBC WITH DIFFERENTIAL/PLATELET - Abnormal; Notable for the following components:      Result Value   WBC 17.6 (*)    RBC 3.51 (*)    Hemoglobin 11.6 (*)    HCT 35.4 (*)    MCV 100.9 (*)    Platelets 128 (*)    Neutro Abs 12.5 (*)    Abs Immature Granulocytes 0.88 (*)    All other components within normal limits  COMPREHENSIVE METABOLIC PANEL - Abnormal; Notable for the following components:   Glucose, Bld 117 (*)    Calcium 8.3 (*)    Albumin 3.2 (*)    Alkaline Phosphatase 411 (*)    All other components  within normal limits  URINE CULTURE  URINALYSIS, ROUTINE W REFLEX MICROSCOPIC      RADIOLOGY I have reviewed the CT personally and interpreted and patient has distended bladder with hydronephrosis.    PROCEDURES:  Critical Care performed: No  Procedures   MEDICATIONS ORDERED IN ED: Medications  colchicine tablet 1.2 mg (1.2 mg Oral Given 09/08/22 1127)  iohexol (OMNIPAQUE) 300 MG/ML solution 100 mL (100 mLs Intravenous Contrast Given 09/08/22 1137)     IMPRESSION / MDM / ASSESSMENT AND PLAN / ED COURSE  I reviewed the triage vital signs and the nursing notes.   Patient's presentation is most consistent with acute presentation with potential threat to life or bodily function.   Patient comes in with 2 complaints.  His main complaint is his urinary issues feel like he can get all of his urine out.  Patient did have a urine sample but postvoid was only 200 cc.  Given his complaint of abdominal pain and distention will get CT imaging to make sure there is no further evidence of obstruction, perforation or other acute pathology.  Initial saturation was noted as low in triage but on recheck here was 98%  he denies any chest pain or shortness of breath.  Denies any falls or hitting his head.  For patient's concern for gout.  On examination he is got no swelling or redness of the ankle.  His pain is more in the midfoot.  There is no redness or warmth.  I suspect that this is related to his gout.  Also on his left elbow I do not see any evidence of a septic joint.  Given he has been unable to afford his colchicine we will give a dose here given his creatinine is normal.  UA is reassuring without any evidence of infection.  Patient's white count is elevated concerning for possible infection although patient is on steroids so could be related to that.Marland Kitchen  His CMP showed normal liver function test.    CT imaging does show distended bladder with hydronephrosis IMPRESSION: 1. Mild bilateral hydronephrosis and distended urinary bladder, consistent with bladder outlet obstruction and hydronephrosis secondary to back pressure. No urinary tract calculi. 2. Prostatomegaly with indistinct margins, particularly at the prostatic base. 3. Numerous enlarged bilateral pelvic sidewall, iliac, and retroperitoneal lymph nodes. 4. 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 5. Constellation of findings is consistent prostate malignancy and associated metastatic disease. 6. Hepatic steatosis. 7. Cholelithiasis without evidence of acute cholecystitis. 8. Pancolonic diverticulosis without evidence of acute diverticulitis.  Discussed findings with Dr. Bernardo Heater who recommended getting a PSA prior to placing the Foley for urinary retention and they will follow-up with him in the clinic and will decide if he needs to see oncology versus if they can manage it.  Given this new diagnosis of cancer will also get a right leg DVT ultrasound just to ensure there is not a DVT causing the edema in his foot although he is adamant that this is typically from his gout.  Foley was  placed with good amount of urine output.  Patient reports feeling better.  For patient's gout he states symptoms are somewhat improved with the colchicine.  We will prescribe some ibuprofen and a few oxycodone to help with pain at home.  Patient already has crutches that he can use.  Patient was taught how to use the Foley and feels comfortable with discharge home and return to the ER if he develops worsening pain or any  other concerns  Patient handed off to oncoming team pending the DVT ultrasound but if negative anticipate discharge home  FINAL CLINICAL IMPRESSION(S) / ED DIAGNOSES   Final diagnoses:  Acute gout, unspecified cause, unspecified site  Urinary retention     Rx / DC Orders   ED Discharge Orders     None        Note:  This document was prepared using Dragon voice recognition software and may include unintentional dictation errors.   Vanessa Toyah, MD 09/08/22 [phone]

## 2022-09-08 NOTE — Discharge Instructions (Addendum)
  You need to leave your Foley in and follow-up with urology outpatient for abnormal CT-they will discuss treatment options and discussed if you need to be referred to oncology or if this is something that they can manage.  Return to the ER if you develop worsening symptoms or any other concerns.  We have given you some colchicine to try to help with your gout but this is not improving you can try the ibuprofen starting on Monday given your kidney function was normal today.  You can also use some intermittent oxycodone for the severe pain although this can increase risk for falls and dizziness to start off with a half a pill and see if you can tolerate it.  Take oxycodone as prescribed. Do not drink alcohol, drive or participate in any other potentially dangerous activities while taking this medication as it may make you sleepy. Do not take this medication with any other sedating medications, either prescription or over-the-counter. If you were prescribed Percocet or Vicodin, do not take these with acetaminophen (Tylenol) as it is already contained within these medications.  This medication is an opiate (or narcotic) pain medication and can be habit forming. Use it as little as possible to achieve adequate pain control. Do not use or use it with extreme caution if you have a history of opiate abuse or dependence. If you are on a pain contract with your primary care doctor or a pain specialist, be sure to let them know you were prescribed this medication today from the Emergency Department. This medication is intended for your use only - do not give any to anyone else and keep it in a secure place where nobody else, especially children, have access to it.      1. Mild bilateral hydronephrosis and distended urinary bladder, consistent with bladder outlet obstruction and hydronephrosis secondary to back pressure. No urinary tract calculi. 2. Prostatomegaly with indistinct margins, particularly at  the prostatic base. 3. Numerous enlarged bilateral pelvic sidewall, iliac, and retroperitoneal lymph nodes. 4. 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 5. Constellation of findings is consistent prostate malignancy and associated metastatic disease. 6. Hepatic steatosis. 7. Cholelithiasis without evidence of acute cholecystitis. 8. Pancolonic diverticulosis without evidence of acute diverticulitis.

## 2022-09-08 NOTE — ED Notes (Signed)
Pt states "I'll pass, on the foley."

## 2022-09-09 LAB — URINE CULTURE: Culture: <10000 — AB

## 2022-09-20 ENCOUNTER — Ambulatory Visit: Payer: Self-pay | Admitting: Urology

## 2022-09-20 ENCOUNTER — Ambulatory Visit (INDEPENDENT_AMBULATORY_CARE_PROVIDER_SITE_OTHER): Payer: 59 | Admitting: Urology

## 2022-09-20 ENCOUNTER — Encounter: Payer: Self-pay | Admitting: Urology

## 2022-09-20 VITALS — BP 95/65 | HR 93 | Ht 70.0 in

## 2022-09-20 DIAGNOSIS — N4289 Other specified disorders of prostate: Secondary | ICD-10-CM | POA: Diagnosis not present

## 2022-09-20 DIAGNOSIS — R339 Retention of urine, unspecified: Secondary | ICD-10-CM | POA: Diagnosis not present

## 2022-09-20 DIAGNOSIS — R9389 Abnormal findings on diagnostic imaging of other specified body structures: Secondary | ICD-10-CM

## 2022-09-20 DIAGNOSIS — C61 Malignant neoplasm of prostate: Secondary | ICD-10-CM

## 2022-09-20 NOTE — Progress Notes (Signed)
09/20/2022 11:27 AM   Roger [name] 04-24-1956 [id number]  Referring provider: Lahoma Rocker, MD [address] [place],  [place] [address]  Chief Complaint  Patient presents with   Urinary Retention    HPI: 66 year old male who presents today for further evaluation of probable metastatic prostate cancer.  He initially presented to the emergency room with gout and difficulty urinating.  He underwent a noncontrast CT scan indicating a distended bladder with bilateral hydronephrosis as well as evidence of metastatic disease including lymphadenopathy along the bilateral pelvic sidewalls, retroperitoneum and iliac nodes.  Additionally, the prostate was noted to have significant prostamegaly with indistinct margins particularly at the base.  Also widespread sclerotic osseous metastatic disease identified throughout the skeleton.  PSA greater than 1500.  A Foley catheter was placed and he was arranged to have outpatient urologic follow-up.  He denies any issues urinating prior to this.  He has been losing weight unintentionally.  He is also having severe pain in his right foot and knee which he attributes to gout.  He denies a family history of prostate but does not know much about his father side.  His mother did have some sort of malignancy but is not sure what.   PMH: Past Medical History:  Diagnosis Date   Chest pain    a. 04/2016 MV: small, mild apical defect c/w apical ischemia, EF 55-65%-->Low risk study.   Diabetes mellitus without complication ([place])    Gout    Hematochezia    History of echocardiogram    a. 04/2016 Echo: EF 55-60%, mild LVH, no rwma, midly dil LA.   History of heart attack    Hypertension    Morbid obesity ([place])     Surgical History: Past Surgical History:  Procedure Laterality Date   COLONOSCOPY WITH PROPOFOL N/A 08/05/2018   Procedure: COLONOSCOPY WITH PROPOFOL;  Surgeon: Lin Landsman, MD;  Location: [facility] ENDOSCOPY;  Service:  Gastroenterology;  Laterality: N/A;   thumb surgery      Home Medications:  Allergies as of 09/20/2022       Reactions   Albuterol Nausea And Vomiting   Mucinex [guaifenesin Er] Rash        Medication List        Accurate as of September 20, 2022 11:59 PM. If you have any questions, ask your nurse or doctor.          allopurinol 100 MG tablet Commonly known as: ZYLOPRIM Take 2 tablets (200 mg total) by mouth 2 (two) times daily.   aspirin EC 81 MG tablet Take 1 tablet (81 mg total) by mouth daily.   colchicine 0.6 MG tablet Take 1 tablet (0.6 mg total) by mouth 2 (two) times daily.   isosorbide mononitrate 30 MG 24 hr tablet Commonly known as: IMDUR TAKE 1 TABLET(30 MG) BY MOUTH DAILY   metFORMIN 500 MG tablet Commonly known as: GLUCOPHAGE Take 1 tablet (500 mg total) by mouth 2 (two) times daily with a meal.   metoprolol tartrate 25 MG tablet Commonly known as: LOPRESSOR Take 1 tablet (25 mg total) by mouth 2 (two) times daily.   predniSONE 10 MG (21) Tbpk tablet Commonly known as: STERAPRED UNI-PAK 21 TAB Take 6 pills on day one then decrease by 1 pill each day   rosuvastatin 10 MG tablet Commonly known as: Crestor Take 1 tablet (10 mg total) by mouth daily.   traMADol 50 MG tablet Commonly known as: ULTRAM Take 1 tablet (50 mg total)  by mouth every 6 (six) hours as needed.   triamterene-hydrochlorothiazide 37.5-25 MG tablet Commonly known as: MAXZIDE-25 Take 1 tablet by mouth daily.        Allergies:  Allergies  Allergen Reactions   Albuterol Nausea And Vomiting   Mucinex [Guaifenesin Er] Rash    Family History: Family History  Problem Relation Age of Onset   Heart Problems Mother    Breast cancer Mother    Mental illness Father    Heart disease Father    Stroke Father    Diabetes Father    Cancer Father    Alcohol abuse Father    Bipolar disorder Father    Heart disease Other    Hypertension Other    Diabetes Other     Social  History:  reports that he quit smoking about 29 years ago. His smoking use included cigarettes. He has a 6.25 pack-year smoking history. He has never used smokeless tobacco. He reports current alcohol use of about 42.0 standard drinks of alcohol per week. He reports that he does not use drugs.   Physical Exam: BP 95/65   Pulse 93   Ht '5\' 10"'$  (1.778 m)   BMI 31.57 kg/m   Constitutional:  Alert and oriented, No acute distress. HEENT: [place] AT, moist mucus membranes.  Trachea midline, no masses. Cardiovascular: No clubbing, cyanosis, or edema. Respiratory: Normal respiratory effort, no increased work of breathing. GI: Abdomen is soft, nontender, nondistended, no abdominal masses GU: Foley in place draining clear yellow urine Skin: No rashes, bruises or suspicious lesions. Neurologic: Grossly intact, no focal deficits, moving all 4 extremities. Psychiatric: Normal mood and affect.  Laboratory Data: Lab Results  Component Value Date   WBC 17.6 (H) 09/08/2022   HGB 11.6 (L) 09/08/2022   HCT 35.4 (L) 09/08/2022   MCV 100.9 (H) 09/08/2022   PLT 128 (L) 09/08/2022    Lab Results  Component Value Date   CREATININE 1.00 09/08/2022    Lab Results  Component Value Date   HGBA1C 7.3 (H) 07/17/2019    Urinalysis    Component Value Date/Time   COLORURINE YELLOW 09/08/2022 0954   APPEARANCEUR CLEAR 09/08/2022 0954   LABSPEC 1.015 09/08/2022 0954   PHURINE 6.5 09/08/2022 0954   GLUCOSEU NEGATIVE 09/08/2022 0954   HGBUR TRACE (A) 09/08/2022 0954   BILIRUBINUR NEGATIVE 09/08/2022 0954   KETONESUR 15 (A) 09/08/2022 0954   PROTEINUR TRACE (A) 09/08/2022 0954   NITRITE NEGATIVE 09/08/2022 0954   LEUKOCYTESUR NEGATIVE 09/08/2022 0954    Lab Results  Component Value Date   BACTERIA RARE (A) 09/08/2022    Pertinent Imaging: IMPRESSION: 1. Mild bilateral hydronephrosis and distended urinary bladder, consistent with bladder outlet obstruction and hydronephrosis secondary to back  pressure. No urinary tract calculi. 2. Prostatomegaly with indistinct margins, particularly at the prostatic base. 3. Numerous enlarged bilateral pelvic sidewall, iliac, and retroperitoneal lymph nodes. 4. 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 5. Constellation of findings is consistent prostate malignancy and associated metastatic disease. 6. Hepatic steatosis. 7. Cholelithiasis without evidence of acute cholecystitis. 8. Pancolonic diverticulosis without evidence of acute diverticulitis.   Aortic Atherosclerosis (ICD10-I70.0).     Electronically Signed   By: Delanna Ahmadi M.D.   On: 09/08/2022 12:15  CT of the abdomen was personally reviewed, agree with radiologic interpretation  Assessment & Plan:    1. Prostate cancer [facility]) Clinical presentation consistent with advanced metastatic prostate cancer, PSA greater than 1500 with significant widespread lymphadenopathy and sclerotic  bone disease  Plan for completion of staging with PSMA PET   We discussed initiation of ADT with Firmagon to avoid the potential for flare and then transition to Eligard.  We discussed the risks including hot flashes, loss of muscle mass, weight gain side effects as well as sexual side effects amongst others.  He was provided literature today.  We will go ahead and get this approved and tried to start therapy ASAP next week.  In addition to the above, he would benefit from medical oncology evaluation as he may be a candidate for chemotherapy given.  At minimum, he will also need an oral agent.  Will defer this this medical oncology.    Lastly, we discussed.  In the setting of urinary retention, he is high risk for prostate and infection patient I did discuss the case with interventional radiology today and they have agreed to try to biopsy pelvic sidewall lymph node under CT guidance.  Order was placed today.  I discussed this plan with the patient  in detail.  I also allowed him to record our conversation today as he like to share it with his girlfriend.   - NM PET (PSMA) SKULL TO MID THIGH; Future - Ambulatory referral to Hematology / Oncology - CT BIOPSY; Future  2. 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  Discussed the alternative to CIC which he declines.  - Bladder Scan (Post Void Residual) in office - Urinalysis, Complete    Return in about 1 week (around 09/27/2022) for firmagon.  Hollice Espy, MD  [facility] [address] [place], [place] [address] [phone]   I spent 65 total minutes on the day of the encounter including pre-visit review of the medical record, face-to-face time with the patient, and post visit ordering of labs/imaging/tests.

## 2022-09-24 ENCOUNTER — Encounter: Payer: Self-pay | Admitting: Oncology

## 2022-09-24 ENCOUNTER — Telehealth: Payer: Self-pay

## 2022-09-24 ENCOUNTER — Inpatient Hospital Stay: Payer: 59

## 2022-09-24 ENCOUNTER — Inpatient Hospital Stay: Payer: 59 | Attending: Oncology | Admitting: Oncology

## 2022-09-24 VITALS — BP 141/96 | HR 114 | Temp 97.2°F

## 2022-09-24 DIAGNOSIS — Z7984 Long term (current) use of oral hypoglycemic drugs: Secondary | ICD-10-CM | POA: Insufficient documentation

## 2022-09-24 DIAGNOSIS — M25561 Pain in right knee: Secondary | ICD-10-CM | POA: Insufficient documentation

## 2022-09-24 DIAGNOSIS — I10 Essential (primary) hypertension: Secondary | ICD-10-CM | POA: Diagnosis not present

## 2022-09-24 DIAGNOSIS — R590 Localized enlarged lymph nodes: Secondary | ICD-10-CM | POA: Diagnosis not present

## 2022-09-24 DIAGNOSIS — N400 Enlarged prostate without lower urinary tract symptoms: Secondary | ICD-10-CM | POA: Insufficient documentation

## 2022-09-24 DIAGNOSIS — Z789 Other specified health status: Secondary | ICD-10-CM

## 2022-09-24 DIAGNOSIS — M109 Gout, unspecified: Secondary | ICD-10-CM | POA: Insufficient documentation

## 2022-09-24 DIAGNOSIS — R972 Elevated prostate specific antigen [PSA]: Secondary | ICD-10-CM | POA: Insufficient documentation

## 2022-09-24 DIAGNOSIS — E119 Type 2 diabetes mellitus without complications: Secondary | ICD-10-CM | POA: Diagnosis not present

## 2022-09-24 DIAGNOSIS — Z87891 Personal history of nicotine dependence: Secondary | ICD-10-CM | POA: Diagnosis not present

## 2022-09-24 DIAGNOSIS — E538 Deficiency of other specified B group vitamins: Secondary | ICD-10-CM | POA: Insufficient documentation

## 2022-09-24 DIAGNOSIS — Z79899 Other long term (current) drug therapy: Secondary | ICD-10-CM | POA: Insufficient documentation

## 2022-09-24 DIAGNOSIS — F1090 Alcohol use, unspecified, uncomplicated: Secondary | ICD-10-CM | POA: Diagnosis not present

## 2022-09-24 DIAGNOSIS — Z7982 Long term (current) use of aspirin: Secondary | ICD-10-CM | POA: Diagnosis not present

## 2022-09-24 DIAGNOSIS — R591 Generalized enlarged lymph nodes: Secondary | ICD-10-CM | POA: Insufficient documentation

## 2022-09-24 DIAGNOSIS — M79671 Pain in right foot: Secondary | ICD-10-CM | POA: Diagnosis not present

## 2022-09-24 DIAGNOSIS — K760 Fatty (change of) liver, not elsewhere classified: Secondary | ICD-10-CM | POA: Diagnosis not present

## 2022-09-24 DIAGNOSIS — Z803 Family history of malignant neoplasm of breast: Secondary | ICD-10-CM | POA: Insufficient documentation

## 2022-09-24 DIAGNOSIS — C801 Malignant (primary) neoplasm, unspecified: Secondary | ICD-10-CM | POA: Insufficient documentation

## 2022-09-24 DIAGNOSIS — C7951 Secondary malignant neoplasm of bone: Secondary | ICD-10-CM | POA: Diagnosis not present

## 2022-09-24 LAB — FOLATE: Folate: 4.3 ng/mL — ABNORMAL LOW (ref >5.9)

## 2022-09-24 LAB — URIC ACID: Uric Acid, Serum: 10.1 mg/dL — ABNORMAL HIGH (ref 3.7–8.6)

## 2022-09-24 LAB — VITAMIN B12: Vitamin B-12: 475 pg/mL (ref 180–914)

## 2022-09-24 NOTE — Assessment & Plan Note (Addendum)
Check uric acid level. - elevated.  He is not complaint with allopurinol and Cochicine. Recommend him to resume.  Recommend him to follow up with PCP for further evaluation. Message sent to his PCP

## 2022-09-24 NOTE — Progress Notes (Signed)
Patient here to establish care for

## 2022-09-24 NOTE — Telephone Encounter (Signed)
Firmagon/Eligard-form and notes faxed to [phone]

## 2022-09-24 NOTE — Progress Notes (Signed)
Hematology/Oncology Consult Note [phone] [phone]     REFERRING PROVIDER: Lahoma Rocker, MD   Patient Care Team: Lahoma Rocker, MD as PCP - General (Rheumatology)   ASSESSMENT & PLAN:   Elevated PSA Suspect Stage IV prostate cancer with lymph node involvement.  Pending PSMA PET scan and CT guided biopsy of pelvic lymphadenopathy.  ADT therapy per Urology.  After tissue diagnosis is confirmed, I will further discuss with patient about additional therapy.   Alcohol use Recommend alcohol cessation.   Acute gout Check uric acid level. - elevated.  He is not complaint with allopurinol and Cochicine.  Recommend him to follow up with PCP for further evaluation. Message sent to his PCP  Folate deficiency Recommend patient to take folic acid supplementation.    Orders Placed This Encounter  Procedures   Vitamin B12    Standing Status:   Future    Number of Occurrences:   1    Standing Expiration Date:   09/25/2023   Folate    Standing Status:   Future    Number of Occurrences:   1    Standing Expiration Date:   09/25/2023   Uric acid    Standing Status:   Future    Number of Occurrences:   1    Standing Expiration Date:   09/25/2023   Ambulatory Referral to Palliative Care    Referral Priority:   Routine    Referral Type:   Consultation    Number of Visits Requested:   1   Follow up after PET and biopsy.   All questions were answered. The patient knows to call the clinic with any problems, questions or concerns.  Earlie Server, MD, PhD [facility] Hematology Oncology 09/24/2022   CHIEF COMPLAINTS/PURPOSE OF CONSULTATION:  Elevated PSA  HISTORY OF PRESENTING ILLNESS:  [name] 66 y.o. male presents to establish care for elevated PSA I have reviewed his chart and materials related to his cancer extensively and collaborated history with the patient.  Patient recently presented to ER for evaluation of urinary difficulty, gout  flare/foot swelling.  Lower extremity US in ER negative for blood clot.  09/08/22 CT abdomen pelvis w contrast showed  1. Mild bilateral hydronephrosis and distended urinary bladder, consistent with bladder outlet obstruction and hydronephrosis secondary to back pressure. No urinary tract calculi. 2. Prostatomegaly with indistinct margins, particularly at the prostatic base. 3. Numerous enlarged bilateral pelvic sidewall, iliac, and retroperitoneal lymph nodes. 4. 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 5. Constellation of findings is consistent prostate malignancy and associated metastatic disease. 6. Hepatic steatosis. 7. Cholelithiasis without evidence of acute cholecystitis. 8. Pancolonic diverticulosis without evidence of acute diverticulitis.Aortic Atherosclerosis   He established with Dr.Brandon. PSA was elevated at 1500.  PSMA PET was obtained and also CT guided inguinal lymph node biopsy was ordered, pending schedule   He is planned to start Androgen deprivation therapy with [facility]ler this week.   He was referred with established care for further evaluation  He was accompanied by two daughters.  His main concern is right foot pain, swelling, and right knee pain.  He has history of gout and supposed to take allopurinol which he is not taking currently. He has been off allopurinol for 6-7 months.     MEDICAL HISTORY:  Past Medical History:  Diagnosis Date   Chest pain    a. 04/2016 MV: small, mild apical defect c/w apical ischemia, EF 55-65%-->Low risk study.   Diabetes  mellitus without complication ([place])    Gout    Hematochezia    History of echocardiogram    a. 04/2016 Echo: EF 55-60%, mild LVH, no rwma, midly dil LA.   History of heart attack    Hypertension    Morbid obesity ([place])     SURGICAL HISTORY: Past Surgical History:  Procedure Laterality Date   COLONOSCOPY WITH PROPOFOL N/A 08/05/2018   Procedure:  COLONOSCOPY WITH PROPOFOL;  Surgeon: Lin Landsman, MD;  Location: [facility] ENDOSCOPY;  Service: Gastroenterology;  Laterality: N/A;   thumb surgery      SOCIAL HISTORY: Social History   Socioeconomic History   Marital status: Divorced    Spouse name: Not on file   Number of children: 5   Years of education: Not on file   Highest education level: High school graduate  Occupational History   Not on file  Tobacco Use   Smoking status: Former    Packs/day: 0.25    Years: 25.00    Total pack years: 6.25    Types: Cigarettes    Quit date: 67    Years since quitting: 29.8   Smokeless tobacco: Never  Vaping Use   Vaping Use: Never used  Substance and Sexual Activity   Alcohol use: Yes    Alcohol/week: 42.0 standard drinks of alcohol    Types: 10 Cans of beer, 32 Shots of liquor per week    Comment: everyday. 3-5 servings whiskey nightly & beer   Drug use: No   Sexual activity: Not Currently  Other Topics Concern   Not on file  Social History Narrative   Not on file   Social Determinants of Health   Financial Resource Strain: Not on file  Food Insecurity: Not on file  Transportation Needs: Not on file  Physical Activity: Not on file  Stress: Not on file  Social Connections: Not on file  Intimate Partner Violence: Not on file    FAMILY HISTORY: Family History  Problem Relation Age of Onset   Heart Problems Mother    Breast cancer Mother    Mental illness Father    Heart disease Father    Stroke Father    Diabetes Father    Alcohol abuse Father    Bipolar disorder Father    Heart disease Other    Hypertension Other    Diabetes Other     ALLERGIES:  is allergic to albuterol and mucinex [guaifenesin er].  MEDICATIONS:  Current Outpatient Medications  Medication Sig Dispense Refill   allopurinol (ZYLOPRIM) 100 MG tablet Take 2 tablets (200 mg total) by mouth 2 (two) times daily. 120 tablet 0   aspirin EC 81 MG tablet Take 1 tablet (81 mg total) by mouth  daily. 30 tablet 0   isosorbide mononitrate (IMDUR) 30 MG 24 hr tablet TAKE 1 TABLET(30 MG) BY MOUTH DAILY 30 tablet 0   metFORMIN (GLUCOPHAGE) 500 MG tablet Take 1 tablet (500 mg total) by mouth 2 (two) times daily with a meal. 60 tablet 0   metoprolol tartrate (LOPRESSOR) 25 MG tablet Take 1 tablet (25 mg total) by mouth 2 (two) times daily. 30 tablet 0   rosuvastatin (CRESTOR) 10 MG tablet Take 1 tablet (10 mg total) by mouth daily. 30 tablet 0   traMADol (ULTRAM) 50 MG tablet Take 1 tablet (50 mg total) by mouth every 6 (six) hours as needed. 15 tablet 0   triamterene-hydrochlorothiazide (MAXZIDE-25) 37.5-25 MG tablet Take 1 tablet by mouth daily. 30 tablet 0  colchicine 0.6 MG tablet Take 1 tablet (0.6 mg total) by mouth 2 (two) times daily. (Patient not taking: Reported on 09/24/2022) 60 tablet 0   No current facility-administered medications for this visit.    Review of Systems  Constitutional:  Negative for appetite change, chills, fatigue, fever and unexpected weight change.       Ambulation difficulty due to pain  HENT:   Negative for hearing loss and voice change.   Eyes:  Negative for eye problems and icterus.  Respiratory:  Negative for chest tightness, cough and shortness of breath.   Cardiovascular:  Negative for chest pain and leg swelling.  Gastrointestinal:  Negative for abdominal distention and abdominal pain.  Endocrine: Negative for hot flashes.  Genitourinary:  Positive for difficulty urinating. Negative for dysuria and frequency.   Musculoskeletal:  Negative for arthralgias.       Right foot pain, swelling  Skin:  Negative for itching and rash.  Neurological:  Negative for light-headedness and numbness.  Hematological:  Negative for adenopathy. Does not bruise/bleed easily.  Psychiatric/Behavioral:  Negative for confusion.      PHYSICAL EXAMINATION: ECOG PERFORMANCE STATUS: 2 - Symptomatic, <50% confined to bed  Vitals:   09/24/22 1320  BP: (!) 141/96   Pulse: (!) 114  Temp: (!) 97.2 F (36.2 C)   There were no vitals filed for this visit.  Physical Exam Constitutional:      General: He is not in acute distress.    Appearance: He is obese. He is not diaphoretic.  HENT:     Head: Normocephalic and atraumatic.     Nose: Nose normal.     Mouth/Throat:     Pharynx: No oropharyngeal exudate.  Eyes:     General: No scleral icterus.    Pupils: Pupils are equal, round, and reactive to light.  Cardiovascular:     Rate and Rhythm: Normal rate.     Heart sounds: No murmur heard. Pulmonary:     Effort: Pulmonary effort is normal. No respiratory distress.     Breath sounds: No rales.  Chest:     Chest wall: No tenderness.  Abdominal:     General: There is no distension.     Palpations: Abdomen is soft.     Tenderness: There is no abdominal tenderness.  Musculoskeletal:        General: Normal range of motion.     Cervical back: Normal range of motion.     Comments: Right foot swelling and tenderness.   Skin:    General: Skin is warm and dry.     Findings: No erythema.  Neurological:     Mental Status: He is alert and oriented to person, place, and time.     Cranial Nerves: No cranial nerve deficit.     Motor: No abnormal muscle tone.  Psychiatric:        Mood and Affect: Mood and affect normal.      LABORATORY DATA:  I have reviewed the data as listed    Latest Ref Rng & Units 09/08/2022    8:26 AM 03/12/2022   12:51 PM 02/23/2022    3:38 PM  CBC  WBC 4.0 - 10.5 K/uL 17.6  13.9  7.4   Hemoglobin 13.0 - 17.0 g/dL 11.6  11.7  11.5   Hematocrit 39.0 - 52.0 % 35.4  35.2  34.8   Platelets 150 - 400 K/uL 128  170  169       Latest Ref Rng & Units  09/08/2022    8:26 AM 04/18/2022    9:28 AM 03/12/2022   12:51 PM  CMP  Glucose 70 - 99 mg/dL 117  181  119   BUN 8 - 23 mg/dL '15  24  16   '$ Creatinine 0.61 - 1.24 mg/dL 1.00  1.32  1.13   Sodium 135 - 145 mmol/L 139  138  138   Potassium 3.5 - 5.1 mmol/L 3.6  3.8  3.8    Chloride 98 - 111 mmol/L 102  102  102   CO2 22 - 32 mmol/L '24  26  25   '$ Calcium 8.9 - 10.3 mg/dL 8.3  9.2  9.2   Total Protein 6.5 - 8.1 g/dL 6.6  7.5  7.3   Total Bilirubin 0.3 - 1.2 mg/dL 1.0  1.0  0.7   Alkaline Phos 38 - 126 U/L 411  193  218   AST 15 - 41 U/L 26  32  27   ALT 0 - 44 U/L '12  18  27     '$ RADIOGRAPHIC STUDIES: I have personally reviewed the radiological images as listed and agreed with the findings in the report. US Venous Img Lower Unilateral Right  Result Date: 09/08/2022 CLINICAL DATA:  Edema in right lower extremity EXAM: Right LOWER EXTREMITY VENOUS DOPPLER ULTRASOUND TECHNIQUE: Gray-scale sonography with compression, as well as color and duplex ultrasound, were performed to evaluate the deep venous system(s) from the level of the common femoral vein through the popliteal and proximal calf veins. COMPARISON:  None Available. FINDINGS: VENOUS Normal compressibility of the common femoral, superficial femoral, and popliteal veins, as well as the visualized calf veins. Visualized portions of profunda femoral vein and great saphenous vein unremarkable. No filling defects to suggest DVT on grayscale or color Doppler imaging. Doppler waveforms show normal direction of venous flow, normal respiratory plasticity and response to augmentation. Limited views of the contralateral common femoral vein are unremarkable. OTHER None. Limitations: none IMPRESSION: There is no evidence of deep venous thrombosis in right lower extremity. Electronically Signed   By: Elmer Picker M.D.   On: 09/08/2022 15:38   CT ABDOMEN PELVIS W CONTRAST  Result Date: 09/08/2022 CLINICAL DATA:  Abdominal pain * Tracking Code: BO * EXAM: CT ABDOMEN AND PELVIS WITH CONTRAST TECHNIQUE: Multidetector CT imaging of the abdomen and pelvis was performed using the standard protocol following bolus administration of intravenous contrast. RADIATION DOSE REDUCTION: This exam was performed according to the  departmental dose-optimization program which includes automated exposure control, adjustment of the mA and/or kV according to patient size and/or use of iterative reconstruction technique. CONTRAST:  170m OMNIPAQUE IOHEXOL 300 MG/ML  SOLN COMPARISON:  None Available. FINDINGS: Lower chest: No acute abnormality. Hepatobiliary: No solid liver abnormality is seen. Hepatic steatosis. Small gallstones at the gallbladder fundus. No gallbladder wall thickening, or biliary dilatation. Pancreas: Unremarkable. No pancreatic ductal dilatation or surrounding inflammatory changes. Spleen: Normal in size without significant abnormality. Adrenals/Urinary Tract: Adrenal glands are unremarkable. Mild bilateral hydronephrosis. No calculi or other obstructing etiology identified to the ureterovesicular junctions distended urinary bladder. Stomach/Bowel: Stomach is within normal limits. Appendix appears normal. No evidence of bowel wall thickening, distention, or inflammatory changes. Pancolonic diverticulosis Vascular/Lymphatic: Aortic atherosclerosis. Numerous enlarged bilateral pelvic sidewall, iliac, and retroperitoneal lymph nodes largest left retroperitoneal lymph nodes measuring up to 1.8 x 1.4 cm (series 2, image 44), left iliac nodes measuring up series 0.2 x 1.5 cm series 2, image 4) Reproductive: Prostatomegaly with indistinct margins, particularly at  the prostatic base (series 6, 81). Other: No abdominal wall hernia or abnormality. No ascites. Musculoskeletal: No acute osseous findings widespread sclerotic osseous metastatic throughout and proximal appendicular skeleton IMPRESSION: 1. Mild bilateral hydronephrosis and distended urinary bladder, consistent with bladder outlet obstruction and hydronephrosis secondary to back pressure. No urinary tract calculi. 2. Prostatomegaly with indistinct margins, particularly at the prostatic base. 3. Numerous enlarged bilateral pelvic sidewall, iliac, and retroperitoneal lymph nodes.  4. 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 5. Constellation of findings is consistent prostate malignancy and associated metastatic disease. 6. Hepatic steatosis. 7. Cholelithiasis without evidence of acute cholecystitis. 8. Pancolonic diverticulosis without evidence of acute diverticulitis. Aortic Atherosclerosis (ICD10-I70.0). Electronically Signed   By: Delanna Ahmadi M.D.   On: 09/08/2022 12:15

## 2022-09-24 NOTE — Assessment & Plan Note (Signed)
Recommend patient to take folic acid supplementation.

## 2022-09-24 NOTE — Progress Notes (Signed)
Firmagon submitted through portal.

## 2022-09-24 NOTE — Assessment & Plan Note (Signed)
Recommend alcohol cessation. 

## 2022-09-24 NOTE — Telephone Encounter (Signed)
Per LOS on 10/30:Follow up 1 week after biopsy  Will keep an eye out for biopsy and PET appts (ordered by Dr. Erlene Quan)

## 2022-09-24 NOTE — Assessment & Plan Note (Signed)
Suspect Stage IV prostate cancer with lymph node involvement.  Pending PSMA PET scan and CT guided biopsy of pelvic lymphadenopathy.  ADT therapy per Urology.  After tissue diagnosis is confirmed, I will further discuss with patient about additional therapy.

## 2022-09-25 ENCOUNTER — Telehealth: Payer: Self-pay | Admitting: Urology

## 2022-09-25 ENCOUNTER — Telehealth: Payer: Self-pay

## 2022-09-25 ENCOUNTER — Other Ambulatory Visit: Payer: Self-pay

## 2022-09-25 DIAGNOSIS — C61 Malignant neoplasm of prostate: Secondary | ICD-10-CM

## 2022-09-25 MED ORDER — FOLIC ACID 1 MG PO TABS: 1.0000 mg | ORAL_TABLET | Freq: Every day | ORAL | 2 refills | Status: DC

## 2022-09-25 NOTE — Telephone Encounter (Signed)
-----   Message from Earlie Server, MD sent at 09/24/2022  9:33 PM EDT ----- Uric acid level is high, he has acute gout flare. Recommend patient to resume allopurinol and colchicine.  Recommend follow up with his PCP for further management.  Folate level is low recommend folic acid supplementation. Rx sent.

## 2022-09-25 NOTE — Telephone Encounter (Signed)
Pt informed of results and recommendations. Pt verbalized understanding.

## 2022-09-25 NOTE — Telephone Encounter (Signed)
PET scan was denied, we will proceed with bone scan and lieu of this.  Order placed.    Hollice Espy, MD

## 2022-09-25 NOTE — Telephone Encounter (Signed)
Revc fax from [facility] stating PET SCAN was denied.

## 2022-09-27 ENCOUNTER — Other Ambulatory Visit: Payer: Self-pay

## 2022-09-27 ENCOUNTER — Telehealth: Payer: Self-pay | Admitting: Urology

## 2022-09-27 ENCOUNTER — Telehealth: Payer: Self-pay | Admitting: Oncology

## 2022-09-27 ENCOUNTER — Inpatient Hospital Stay
Admission: EM | Admit: 2022-09-27 | Discharge: 2022-10-06 | DRG: 698 | Disposition: A | Discharge status: Skilled Nursing Facility | Payer: 59 | Attending: Internal Medicine | Admitting: Internal Medicine

## 2022-09-27 ENCOUNTER — Emergency Department: Payer: 59

## 2022-09-27 DIAGNOSIS — E663 Overweight: Secondary | ICD-10-CM | POA: Diagnosis present

## 2022-09-27 DIAGNOSIS — G9341 Metabolic encephalopathy: Secondary | ICD-10-CM | POA: Diagnosis present

## 2022-09-27 DIAGNOSIS — R6520 Severe sepsis without septic shock: Secondary | ICD-10-CM | POA: Diagnosis present

## 2022-09-27 DIAGNOSIS — R338 Other retention of urine: Secondary | ICD-10-CM | POA: Diagnosis present

## 2022-09-27 DIAGNOSIS — Y846 Urinary catheterization as the cause of abnormal reaction of the patient, or of later complication, without mention of misadventure at the time of the procedure: Secondary | ICD-10-CM | POA: Diagnosis present

## 2022-09-27 DIAGNOSIS — E785 Hyperlipidemia, unspecified: Secondary | ICD-10-CM | POA: Diagnosis present

## 2022-09-27 DIAGNOSIS — Z8249 Family history of ischemic heart disease and other diseases of the circulatory system: Secondary | ICD-10-CM

## 2022-09-27 DIAGNOSIS — I10 Essential (primary) hypertension: Secondary | ICD-10-CM | POA: Diagnosis present

## 2022-09-27 DIAGNOSIS — M109 Gout, unspecified: Secondary | ICD-10-CM | POA: Diagnosis present

## 2022-09-27 DIAGNOSIS — F10931 Alcohol use, unspecified with withdrawal delirium: Secondary | ICD-10-CM | POA: Insufficient documentation

## 2022-09-27 DIAGNOSIS — Z823 Family history of stroke: Secondary | ICD-10-CM

## 2022-09-27 DIAGNOSIS — N136 Pyonephrosis: Secondary | ICD-10-CM | POA: Diagnosis present

## 2022-09-27 DIAGNOSIS — Z811 Family history of alcohol abuse and dependence: Secondary | ICD-10-CM

## 2022-09-27 DIAGNOSIS — C7951 Secondary malignant neoplasm of bone: Secondary | ICD-10-CM | POA: Diagnosis present

## 2022-09-27 DIAGNOSIS — N390 Urinary tract infection, site not specified: Secondary | ICD-10-CM | POA: Diagnosis not present

## 2022-09-27 DIAGNOSIS — N179 Acute kidney failure, unspecified: Secondary | ICD-10-CM | POA: Diagnosis present

## 2022-09-27 DIAGNOSIS — Z91148 Patient's other noncompliance with medication regimen for other reason: Secondary | ICD-10-CM

## 2022-09-27 DIAGNOSIS — E669 Obesity, unspecified: Secondary | ICD-10-CM | POA: Diagnosis present

## 2022-09-27 DIAGNOSIS — D649 Anemia, unspecified: Secondary | ICD-10-CM | POA: Diagnosis present

## 2022-09-27 DIAGNOSIS — E872 Acidosis, unspecified: Secondary | ICD-10-CM | POA: Diagnosis present

## 2022-09-27 DIAGNOSIS — A4151 Sepsis due to Escherichia coli [E. coli]: Secondary | ICD-10-CM | POA: Diagnosis present

## 2022-09-27 DIAGNOSIS — C61 Malignant neoplasm of prostate: Secondary | ICD-10-CM | POA: Diagnosis present

## 2022-09-27 DIAGNOSIS — Z978 Presence of other specified devices: Secondary | ICD-10-CM | POA: Diagnosis present

## 2022-09-27 DIAGNOSIS — T83511A Infection and inflammatory reaction due to indwelling urethral catheter, initial encounter: Principal | ICD-10-CM | POA: Diagnosis present

## 2022-09-27 DIAGNOSIS — I252 Old myocardial infarction: Secondary | ICD-10-CM | POA: Diagnosis not present

## 2022-09-27 DIAGNOSIS — Z7982 Long term (current) use of aspirin: Secondary | ICD-10-CM

## 2022-09-27 DIAGNOSIS — Z20822 Contact with and (suspected) exposure to covid-19: Secondary | ICD-10-CM | POA: Diagnosis present

## 2022-09-27 DIAGNOSIS — Z803 Family history of malignant neoplasm of breast: Secondary | ICD-10-CM

## 2022-09-27 DIAGNOSIS — F10231 Alcohol dependence with withdrawal delirium: Secondary | ICD-10-CM | POA: Diagnosis present

## 2022-09-27 DIAGNOSIS — D638 Anemia in other chronic diseases classified elsewhere: Secondary | ICD-10-CM | POA: Diagnosis present

## 2022-09-27 DIAGNOSIS — K760 Fatty (change of) liver, not elsewhere classified: Secondary | ICD-10-CM | POA: Diagnosis present

## 2022-09-27 DIAGNOSIS — E538 Deficiency of other specified B group vitamins: Secondary | ICD-10-CM | POA: Diagnosis present

## 2022-09-27 DIAGNOSIS — D696 Thrombocytopenia, unspecified: Secondary | ICD-10-CM | POA: Diagnosis not present

## 2022-09-27 DIAGNOSIS — Z79899 Other long term (current) drug therapy: Secondary | ICD-10-CM

## 2022-09-27 DIAGNOSIS — Z6831 Body mass index (BMI) 31.0-31.9, adult: Secondary | ICD-10-CM

## 2022-09-27 DIAGNOSIS — F1010 Alcohol abuse, uncomplicated: Secondary | ICD-10-CM | POA: Insufficient documentation

## 2022-09-27 DIAGNOSIS — E119 Type 2 diabetes mellitus without complications: Secondary | ICD-10-CM

## 2022-09-27 DIAGNOSIS — Z87891 Personal history of nicotine dependence: Secondary | ICD-10-CM

## 2022-09-27 DIAGNOSIS — E1165 Type 2 diabetes mellitus with hyperglycemia: Secondary | ICD-10-CM | POA: Diagnosis present

## 2022-09-27 DIAGNOSIS — A419 Sepsis, unspecified organism: Secondary | ICD-10-CM | POA: Diagnosis present

## 2022-09-27 DIAGNOSIS — D6959 Other secondary thrombocytopenia: Secondary | ICD-10-CM | POA: Diagnosis not present

## 2022-09-27 DIAGNOSIS — Z7984 Long term (current) use of oral hypoglycemic drugs: Secondary | ICD-10-CM

## 2022-09-27 DIAGNOSIS — Z818 Family history of other mental and behavioral disorders: Secondary | ICD-10-CM

## 2022-09-27 DIAGNOSIS — Z833 Family history of diabetes mellitus: Secondary | ICD-10-CM

## 2022-09-27 DIAGNOSIS — D528 Other folate deficiency anemias: Secondary | ICD-10-CM | POA: Diagnosis not present

## 2022-09-27 LAB — COMPREHENSIVE METABOLIC PANEL
ALT: 11 U/L (ref 0–44)
AST: 26 U/L (ref 15–41)
Albumin: 2.2 g/dL — ABNORMAL LOW (ref 3.5–5.0)
Alkaline Phosphatase: 295 U/L — ABNORMAL HIGH (ref 38–126)
Anion gap: 12 (ref 5–15)
BUN: 51 mg/dL — ABNORMAL HIGH (ref 8–23)
CO2: 23 mmol/L (ref 22–32)
Calcium: 7.7 mg/dL — ABNORMAL LOW (ref 8.9–10.3)
Chloride: 98 mmol/L (ref 98–111)
Creatinine, Ser: 1.85 mg/dL — ABNORMAL HIGH (ref 0.61–1.24)
GFR, Estimated: 40 mL/min — ABNORMAL LOW (ref >60)
Glucose, Bld: 160 mg/dL — ABNORMAL HIGH (ref 70–99)
Potassium: 4.3 mmol/L (ref 3.5–5.1)
Sodium: 133 mmol/L — ABNORMAL LOW (ref 135–145)
Total Bilirubin: 1.4 mg/dL — ABNORMAL HIGH (ref 0.3–1.2)
Total Protein: 6.6 g/dL (ref 6.5–8.1)

## 2022-09-27 LAB — CBC WITH DIFFERENTIAL/PLATELET
Abs Immature Granulocytes: 0.23 10*3/uL — ABNORMAL HIGH (ref 0.00–0.07)
Basophils Absolute: 0 10*3/uL (ref 0.0–0.1)
Basophils Relative: 0 %
Eosinophils Absolute: 0 10*3/uL (ref 0.0–0.5)
Eosinophils Relative: 0 %
HCT: 30 % — ABNORMAL LOW (ref 39.0–52.0)
Hemoglobin: 9.9 g/dL — ABNORMAL LOW (ref 13.0–17.0)
Immature Granulocytes: 3 %
Lymphocytes Relative: 4 %
Lymphs Abs: 0.4 10*3/uL — ABNORMAL LOW (ref 0.7–4.0)
MCH: 32.7 pg (ref 26.0–34.0)
MCHC: 33 g/dL (ref 30.0–36.0)
MCV: 99 fL (ref 80.0–100.0)
Monocytes Absolute: 0.3 10*3/uL (ref 0.1–1.0)
Monocytes Relative: 3 %
Neutro Abs: 8.4 10*3/uL — ABNORMAL HIGH (ref 1.7–7.7)
Neutrophils Relative %: 90 %
Platelets: 109 10*3/uL — ABNORMAL LOW (ref 150–400)
RBC: 3.03 MIL/uL — ABNORMAL LOW (ref 4.22–5.81)
RDW: 13.1 % (ref 11.5–15.5)
Smear Review: NORMAL
WBC: 9.4 10*3/uL (ref 4.0–10.5)
nRBC: 0 % (ref 0.0–0.2)

## 2022-09-27 LAB — URINALYSIS, COMPLETE (UACMP) WITH MICROSCOPIC
Bilirubin Urine: NEGATIVE
Glucose, UA: NEGATIVE mg/dL
Ketones, ur: NEGATIVE mg/dL
Nitrite: POSITIVE — AB
Protein, ur: 100 mg/dL — AB
Specific Gravity, Urine: 1.017 (ref 1.005–1.030)
WBC, UA: >50 WBC/hpf — ABNORMAL HIGH (ref 0–5)
pH: 5 (ref 5.0–8.0)

## 2022-09-27 LAB — LACTIC ACID, PLASMA: Lactic Acid, Venous: 1.4 mmol/L (ref 0.5–1.9)

## 2022-09-27 LAB — RESP PANEL BY RT-PCR (FLU A&B, COVID) ARPGX2
Influenza A by PCR: NEGATIVE
Influenza B by PCR: NEGATIVE
SARS Coronavirus 2 by RT PCR: NEGATIVE

## 2022-09-27 LAB — PROTIME-INR
INR: 1.6 — ABNORMAL HIGH (ref 0.8–1.2)
Prothrombin Time: 18.8 seconds — ABNORMAL HIGH (ref 11.4–15.2)

## 2022-09-27 LAB — GLUCOSE, CAPILLARY: Glucose-Capillary: 146 mg/dL — ABNORMAL HIGH (ref 70–99)

## 2022-09-27 LAB — APTT: aPTT: 39 seconds — ABNORMAL HIGH (ref 24–36)

## 2022-09-27 MED ORDER — ENOXAPARIN SODIUM 40 MG/0.4ML IJ SOSY
40.0000 mg | PREFILLED_SYRINGE | INTRAMUSCULAR | Status: DC
  Administered 2022-09-28 – 2022-10-04 (×7): 40 mg via SUBCUTANEOUS
  Filled 2022-09-27 (×7): qty 0.4

## 2022-09-27 MED ORDER — ACETAMINOPHEN 325 MG PO TABS
650.0000 mg | ORAL_TABLET | Freq: Four times a day (QID) | ORAL | Status: DC | PRN
  Administered 2022-09-28 – 2022-10-04 (×3): 650 mg via ORAL
  Filled 2022-09-27 (×3): qty 2

## 2022-09-27 MED ORDER — SODIUM CHLORIDE 0.9 % IV SOLN
2.0000 g | Freq: Once | INTRAVENOUS | Status: AC
  Administered 2022-09-27: 2 g via INTRAVENOUS
  Filled 2022-09-27: qty 12.5

## 2022-09-27 MED ORDER — SODIUM CHLORIDE 0.9 % IV SOLN
2.0000 g | Freq: Two times a day (BID) | INTRAVENOUS | Status: DC
  Filled 2022-09-27: qty 12.5

## 2022-09-27 MED ORDER — ACETAMINOPHEN 650 MG RE SUPP: 650.0000 mg | Freq: Four times a day (QID) | RECTAL | Status: DC | PRN

## 2022-09-27 MED ORDER — METRONIDAZOLE 500 MG/100ML IV SOLN
500.0000 mg | Freq: Once | INTRAVENOUS | Status: AC
  Administered 2022-09-27: 500 mg via INTRAVENOUS
  Filled 2022-09-27: qty 100

## 2022-09-27 MED ORDER — VANCOMYCIN HCL 1500 MG/300ML IV SOLN
1500.0000 mg | Freq: Once | INTRAVENOUS | Status: AC
  Administered 2022-09-27: 1500 mg via INTRAVENOUS
  Filled 2022-09-27: qty 300

## 2022-09-27 MED ORDER — INSULIN ASPART 100 UNIT/ML IJ SOLN
0.0000 [IU] | INTRAMUSCULAR | Status: DC
  Administered 2022-09-27 – 2022-09-29 (×2): 2 [IU] via SUBCUTANEOUS
  Administered 2022-09-30: 3 [IU] via SUBCUTANEOUS
  Administered 2022-09-30: 5 [IU] via SUBCUTANEOUS
  Administered 2022-09-30: 8 [IU] via SUBCUTANEOUS
  Administered 2022-09-30 – 2022-10-01 (×3): 3 [IU] via SUBCUTANEOUS
  Administered 2022-10-01: 2 [IU] via SUBCUTANEOUS
  Administered 2022-10-01: 3 [IU] via SUBCUTANEOUS
  Administered 2022-10-02: 2 [IU] via SUBCUTANEOUS
  Administered 2022-10-02: 3 [IU] via SUBCUTANEOUS
  Administered 2022-10-02: 8 [IU] via SUBCUTANEOUS
  Administered 2022-10-02 (×3): 3 [IU] via SUBCUTANEOUS
  Administered 2022-10-03: 2 [IU] via SUBCUTANEOUS
  Administered 2022-10-03: 5 [IU] via SUBCUTANEOUS
  Administered 2022-10-03 – 2022-10-04 (×4): 3 [IU] via SUBCUTANEOUS
  Administered 2022-10-04: 2 [IU] via SUBCUTANEOUS
  Administered 2022-10-04: 3 [IU] via SUBCUTANEOUS
  Filled 2022-09-27 (×28): qty 1

## 2022-09-27 MED ORDER — VANCOMYCIN HCL IN DEXTROSE 1-5 GM/200ML-% IV SOLN: 1000.0000 mg | Freq: Once | INTRAVENOUS | Status: DC

## 2022-09-27 MED ORDER — LACTATED RINGERS IV BOLUS (SEPSIS)
1000.0000 mL | Freq: Once | INTRAVENOUS | Status: AC
  Administered 2022-09-27: 1000 mL via INTRAVENOUS

## 2022-09-27 MED ORDER — ONDANSETRON HCL 4 MG PO TABS: 4.0000 mg | ORAL_TABLET | Freq: Four times a day (QID) | ORAL | Status: DC | PRN

## 2022-09-27 MED ORDER — SODIUM CHLORIDE 0.9 % IV BOLUS (SEPSIS)
1000.0000 mL | Freq: Once | INTRAVENOUS | Status: AC
  Administered 2022-09-27: 1000 mL via INTRAVENOUS

## 2022-09-27 MED ORDER — ONDANSETRON HCL 4 MG/2ML IJ SOLN: 4.0000 mg | Freq: Four times a day (QID) | INTRAMUSCULAR | Status: DC | PRN

## 2022-09-27 NOTE — Assessment & Plan Note (Addendum)
According to oncology, patient has been noncompliant with some of his medications including his gout ones.  For now, we will treat with pain medication as well as steroids.

## 2022-09-27 NOTE — Progress Notes (Signed)
Pharmacy Antibiotic Note  Roger [name] is a 66 y.o. male admitted on 09/27/2022 with sepsis secondary to UTI.  Pharmacy has been consulted for Cefepime dosing for 7 days.  Plan: Cefepime 2 gm q12hr per indication & renal fxn.  Pharmacy will continue to follow and will adjust abx dosing whenever warranted.  Temp (24hrs), Avg:99.7 F (37.6 C), Min:98.4 F (36.9 C), Max:102.1 F (38.9 C)   Recent Labs  Lab 09/27/22 1953 09/27/22 1954  WBC  --  9.4  CREATININE  --  1.85*  LATICACIDVEN 1.4  --     Estimated Creatinine Clearance: 46.2 mL/min (A) (by C-G formula based on SCr of 1.85 mg/dL (H)).    Allergies  Allergen Reactions   Albuterol Nausea And Vomiting   Mucinex [Guaifenesin Er] Rash    Antimicrobials this admission: 11/02 Cefepime >> x 7 days 11/02 Vancomycin >> x 1 dose 11/02 Flagyl >> x 1 dose  Microbiology results: 11/02 BCx: Pending 11/02 UCx: Pending   Thank you for allowing pharmacy to be a part of this patient's care.  Renda Rolls, PharmD, [facility] 09/27/2022 11:11 PM

## 2022-09-27 NOTE — Assessment & Plan Note (Addendum)
Likely secondary to sepsis plus gout arthropathy.  Creatinine actually worse today at 2.1, will continue IV fluids.  Try to avoid nephrotoxins.

## 2022-09-27 NOTE — ED Triage Notes (Signed)
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 Pt has had generalized weakness for a couple of days and laying on the couch. Pt is having pain in feet. Pt was running a fever of 101.5 per EMS.

## 2022-09-27 NOTE — Telephone Encounter (Signed)
Dr. Tasia Catchings checked uric acid level and it was elevated. She recommended him to restart colchicine and allopurinol and follow up with PCP for further management.

## 2022-09-27 NOTE — Telephone Encounter (Signed)
Per Dr. Tasia Catchings," Let him know that I will send a small supply. He needs to see his PCP for long term gout management. If he agrees I will send."  Called pt x3 and no answer. Detailed VM left. Will wait for call back before proceeding to send medication.

## 2022-09-27 NOTE — Progress Notes (Signed)
PHARMACY -  BRIEF ANTIBIOTIC NOTE   Pharmacy has received consult(s) for vancomycin and cefepime from an ED provider.  The patient's profile has been reviewed for ht/wt/allergies/indication/available labs.    One time order(s) placed for   1) cefepime 2 grams IV x 1  2) vancomycin 1500 mg IV x 1  Further antibiotics/pharmacy consults should be ordered by admitting physician if indicated.                       Thank you, Dallie Piles 09/27/2022  8:03 PM

## 2022-09-27 NOTE — Telephone Encounter (Signed)
09/27/22~Called patient to [place], per email rcvd from [facility] @ [place]. S/W patient whom seem to be incoherant and disoriented w confusion. Asked to s/w someone else in the home several times, due to concern and eventually patient's grandson got on the phone, whom seem to be a minor & unable to understand. I asked if there was someone else that was able to s/w me & the grandson said that he will go and get someone while I hold on. He left the home for a few mins to retrieve the patient's sister that lived across the street.  S/W patient's sister, Curlene Dolphin who stated that patient is extremely disoriented, having Chills, Unable to sit, stand or walk & in pain and is having issues with Foley Cath and painful urination. Secure Chat Msg sent to Dr Erlene Quan & Dr Tasia Catchings. Patient now going in and out of consciousness.  Sister stated that patient was told that he has Stage 4 Cancer, and it has more than likely spread to his Bones. Advised to call 911. Stayed on phone while sister called an Ambulance & operator instructed sister to turn patient on his side to avoid aspiration if he vomited. An ambulance is on the way.  Sister is crying & stated that she doesn't want his to stay at home and die.

## 2022-09-27 NOTE — Assessment & Plan Note (Signed)
Hold BP meds due to softening BP

## 2022-09-27 NOTE — H&P (Signed)
History and Physical    Patient: Roger [name] CBJ:[id number] DOB: February 01, 1956 DOA: 09/27/2022 DOS: the patient was seen and examined on 09/27/2022 PCP: Lahoma Rocker, MD  Patient coming from: Home  Chief Complaint:  Chief Complaint  Patient presents with   Weakness    HPI: Roger [name] is a 66 y.o. male with medical history significant for Diabetes, hypertension, gout, recently diagnosed with prostate cancer metastatic to bone on CT after presenting to the ED on 10/14 for urinary retention and who had a Foley catheter placed at that time, following up on 11/26 with urology and due to get nuclear bone scan scheduled, who was sent to the ED for evaluation of confusion and disorientation.  2 sons at the bedside and contributes to the history which was also taken from review of telephone records.  Apparently patient was called by nuclear to have the bone scan scheduled and he sounded incoherent and disoriented on the phone and they were able to speak to a relative about their concern advising them to call 911 and he was subsequently brought to the ED.  Sons at bedside state patient was in the care of his sister at the time.  They have little information on how he had been doing previously but knew that he had been complaining of a gout flareup in his right leg and it was very painful when they were trying to help him into the ambulance. ED course and data review: Tmax 102.1, BP 141/78 on arrival going as low as 106/68 within hours of arrival.  Pulse initially 132 improving to 103 with fluids.  O2 sat in the high 90s on room air.  Labs with normal WBC and lactic acid 1.4..  Hemoglobin 9.9 down from 11.6 a couple weeks prior.  Creatinine 1.85 up from 1.0 2 weeks ago.  Urinalysis consistent with UTI.  EKG, personally viewed and interpreted shows sinus tachycardia at 129 with nonspecific ST-T wave changes.  Chest x-ray showing widespread bony metastases but otherwise nonacute. Patient treated with a  fluid bolus, cefepime and metronidazole and vancomycin and hospitalist consulted for admission.     Past Medical History:  Diagnosis Date   Chest pain    a. 04/2016 MV: small, mild apical defect c/w apical ischemia, EF 55-65%-->Low risk study.   Diabetes mellitus without complication ([place])    Gout    Hematochezia    History of echocardiogram    a. 04/2016 Echo: EF 55-60%, mild LVH, no rwma, midly dil LA.   History of heart attack    Hypertension    Morbid obesity ([place])    Past Surgical History:  Procedure Laterality Date   COLONOSCOPY WITH PROPOFOL N/A 08/05/2018   Procedure: COLONOSCOPY WITH PROPOFOL;  Surgeon: Lin Landsman, MD;  Location[facility] ENDOSCOPY;  Service: Gastroenterology;  Laterality: N/A;   thumb surgery     Social History:  reports that he quit smoking about 29 years ago. His smoking use included cigarettes. He has a 6.25 pack-year smoking history. He has never used smokeless tobacco. He reports current alcohol use of about 42.0 standard drinks of alcohol per week. He reports that he does not use drugs.  Allergies  Allergen Reactions   Albuterol Nausea And Vomiting   Mucinex [Guaifenesin Er] Rash    Family History  Problem Relation Age of Onset   Heart Problems Mother    Breast cancer Mother    Mental illness Father    Heart disease Father    Stroke Father  Diabetes Father    Alcohol abuse Father    Bipolar disorder Father    Heart disease Other    Hypertension Other    Diabetes Other     Prior to Admission medications   Medication Sig Start Date End Date Taking? Authorizing Provider  aspirin EC 81 MG tablet Take 1 tablet (81 mg total) by mouth daily. 12/17/21  Yes Fisher, Linden Dolin, PA-C  folic acid (FOLVITE) 1 MG tablet Take 1 tablet (1 mg total) by mouth daily. 09/25/22  Yes Earlie Server, MD  allopurinol (ZYLOPRIM) 100 MG tablet Take 2 tablets (200 mg total) by mouth 2 (two) times daily. 12/17/21 09/24/22  Fisher, Linden Dolin, PA-C  colchicine 0.6 MG  tablet Take 1 tablet (0.6 mg total) by mouth 2 (two) times daily. Patient not taking: Reported on 09/24/2022 12/17/21 09/20/22  Versie Starks, PA-C  isosorbide mononitrate (IMDUR) 30 MG 24 hr tablet TAKE 1 TABLET(30 MG) BY MOUTH DAILY Patient not taking: Reported on 09/27/2022 12/17/21   Versie Starks, PA-C  metFORMIN (GLUCOPHAGE) 500 MG tablet Take 1 tablet (500 mg total) by mouth 2 (two) times daily with a meal. 12/17/21   Fisher, Linden Dolin, PA-C  metoprolol tartrate (LOPRESSOR) 25 MG tablet Take 1 tablet (25 mg total) by mouth 2 (two) times daily. Patient not taking: Reported on 09/27/2022 12/17/21   Versie Starks, PA-C  rosuvastatin (CRESTOR) 10 MG tablet Take 1 tablet (10 mg total) by mouth daily. Patient not taking: Reported on 09/27/2022 12/17/21   Versie Starks, PA-C  traMADol (ULTRAM) 50 MG tablet Take 1 tablet (50 mg total) by mouth every 6 (six) hours as needed. Patient not taking: Reported on 09/27/2022 12/17/21   Versie Starks, PA-C  triamterene-hydrochlorothiazide (MAXZIDE-25) 37.5-25 MG tablet Take 1 tablet by mouth daily. Patient not taking: Reported on 09/27/2022 07/03/20   Coral Spikes, DO  lisinopril (ZESTRIL) 20 MG tablet TAKE 1 TABLET(20 MG) BY MOUTH DAILY 05/13/19 06/25/19  Leone Haven, MD    Physical Exam: Vitals:   09/27/22 2130 09/27/22 2153 09/27/22 2200 09/27/22 2230  BP: 106/68  102/70 96/71  Pulse: (!) 111 (!) 103 (!) 102 99  Resp: '16 19 12 19  '$ Temp: 98.4 F (36.9 C)     TempSrc: Oral     SpO2: 94% 95% 97% 96%  Weight:       Physical Exam Vitals and nursing note reviewed.  Constitutional:      General: He is not in acute distress. HENT:     Head: Normocephalic and atraumatic.  Cardiovascular:     Rate and Rhythm: Regular rhythm. Tachycardia present.     Heart sounds: Normal heart sounds.  Pulmonary:     Effort: Tachypnea present.     Breath sounds: Normal breath sounds.  Abdominal:     Palpations: Abdomen is soft.     Tenderness: There is no  abdominal tenderness.  Neurological:     Mental Status: He is lethargic and disoriented.     Labs on Admission: I have personally reviewed following labs and imaging studies  CBC: Recent Labs  Lab 09/27/22 1954  WBC 9.4  NEUTROABS 8.4*  HGB 9.9*  HCT 30.0*  MCV 99.0  PLT 315*   Basic Metabolic Panel: Recent Labs  Lab 09/27/22 1954  NA 133*  K 4.3  CL 98  CO2 23  GLUCOSE 160*  BUN 51*  CREATININE 1.85*  CALCIUM 7.7*   GFR: Estimated Creatinine Clearance: 46.2 mL/min (A) (  by C-G formula based on SCr of 1.85 mg/dL (H)). Liver Function Tests: Recent Labs  Lab 09/27/22 1954  AST 26  ALT 11  ALKPHOS 295*  BILITOT 1.4*  PROT 6.6  ALBUMIN 2.2*   No results for input(s): "LIPASE", "AMYLASE" in the last 168 hours. No results for input(s): "AMMONIA" in the last 168 hours. Coagulation Profile: Recent Labs  Lab 09/27/22 1954  INR 1.6*   Cardiac Enzymes: No results for input(s): "CKTOTAL", "CKMB", "CKMBINDEX", "TROPONINI" in the last 168 hours. BNP (last 3 results) No results for input(s): "PROBNP" in the last 8760 hours. HbA1C: No results for input(s): "HGBA1C" in the last 72 hours. CBG: No results for input(s): "GLUCAP" in the last 168 hours. Lipid Profile: No results for input(s): "CHOL", "HDL", "LDLCALC", "TRIG", "CHOLHDL", "LDLDIRECT" in the last 72 hours. Thyroid Function Tests: No results for input(s): "TSH", "T4TOTAL", "FREET4", "T3FREE", "THYROIDAB" in the last 72 hours. Anemia Panel: No results for input(s): "VITAMINB12", "FOLATE", "FERRITIN", "TIBC", "IRON", "RETICCTPCT" in the last 72 hours. Urine analysis:    Component Value Date/Time   COLORURINE AMBER (A) 09/27/2022 2119   APPEARANCEUR TURBID (A) 09/27/2022 2119   LABSPEC 1.017 09/27/2022 2119   PHURINE 5.0 09/27/2022 2119   GLUCOSEU NEGATIVE 09/27/2022 2119   HGBUR MODERATE (A) 09/27/2022 2119   BILIRUBINUR NEGATIVE 09/27/2022 2119   [place] NEGATIVE 09/27/2022 2119   PROTEINUR 100 (A)  09/27/2022 2119   NITRITE POSITIVE (A) 09/27/2022 2119   LEUKOCYTESUR MODERATE (A) 09/27/2022 2119    Radiological Exams on Admission: DG Chest Port 1 View  Result Date: 09/27/2022 CLINICAL DATA:  Generalized weakness and low energy. Low-grade fever. Questionable sepsis. Check for infiltrate. EXAM: PORTABLE CHEST 1 VIEW COMPARISON:  PA Lat chest 04/01/2021 FINDINGS: There is a low inspiration exaggerating the mediastinum and heart. There is mild-to-moderate cardiomegaly. Aortic tortuosity and ectasia are noted with patchy calcification of the transverse segment. The hypoinflated lungs appear generally clear but there is limited view of bases. No pleural effusion is seen. Thoracic spondylosis. There is a history of prostate cancer with bone metastases. Recent abdomen and pelvis CT 09/08/2022 demonstrated widespread bony metastases in the ribcage, spine and pelvis. There are sclerotic lesions in both proximal humeri which are probably also metastatic. IMPRESSION: 1. Low inspiration exaggerating the mediastinum and heart. No evidence of acute chest process. Limited view of the bases. 2. Cardiomegaly. 3. Aortic atherosclerosis. 4. Widespread bony metastases. Electronically Signed   By: Telford Nab M.D.   On: 09/27/2022 20:27     Data Reviewed: Relevant notes from primary care and specialist visits, past discharge summaries as available in EHR, including Care Everywhere. Prior diagnostic testing as pertinent to current admission diagnoses Updated medications and problem lists for reconciliation ED course, including vitals, labs, imaging, treatment and response to treatment Triage notes, nursing and pharmacy notes and ED provider's notes Notable results as noted in HPI   Assessment and Plan: * Severe sepsis secondary to UTI ([place]) Indwelling Foley catheter present Prostate cancer metastatic to bone Severe sepsis criteria includes fever, tachycardia and hypotension as well as AKI and acute  encephalopathy Continue sepsis fluids Continue cefepime Follow cultures  AKI (acute kidney injury) ([place]) Creatinine 1.85 up from 1.0 2 weeks ago Secondary to sepsis Hydrate and monitor. Avoid nephrotoxins  Acute metabolic encephalopathy We will keep n.p.o. until more alert, Fall and aspiration precautions Neurologic checks Will get head CT  Anemia Hemoglobin 9.9, down from 11.6 on 10/16 Continue to trend  Gout Patient had an acute  flare recently and was having pain earlier in the day Prn pain meds  Diabetes mellitus without complication (HCC) Sliding scale coverage  Essential hypertension Hold BP meds due to softening BP        DVT prophylaxis: Lovenox  Consults: none  Advance Care Planning: full code  Family Communication: none  Disposition Plan: Back to previous home environment  Severity of Illness: The appropriate patient status for this patient is INPATIENT. Inpatient status is judged to be reasonable and necessary in order to provide the required intensity of service to ensure the patient's safety. The patient's presenting symptoms, physical exam findings, and initial radiographic and laboratory data in the context of their chronic comorbidities is felt to place them at high risk for further clinical deterioration. Furthermore, it is not anticipated that the patient will be medically stable for discharge from the hospital within 2 midnights of admission.   * I certify that at the point of admission it is my clinical judgment that the patient will require inpatient hospital care spanning beyond 2 midnights from the point of admission due to high intensity of service, high risk for further deterioration and high frequency of surveillance required.*  Author: Athena Masse, MD 09/27/2022 10:44 PM  For on call review [email or web address].

## 2022-09-27 NOTE — ED Provider Notes (Signed)
[facility] Provider Note    Event Date/Time   First MD Initiated Contact with Patient 09/27/22 1942     (approximate)  History   Chief Complaint: Weakness  HPI  Roger [name] is a 66 y.o. male with a past medical history of diabetes, hypertension, gout, presents to the emergency department with generalized weakness fatigue found to be febrile.  According to EMS report patient was recently diagnosed with stage IV prostate cancer with bony metastases.  They have noted that the patient has had worsening weakness so they called EMS.  Patient states he did have a Foley catheter placed approximately 1 week ago currently is wearing a leg bag.  Patient febrile to 102.1 and tachycardic to 132 and tachypneic to 26 breaths/min in the emergency department.  Patient denies any chest pain abdominal pain does state a slight cough denies any congestion.  Physical Exam   Triage Vital Signs: ED Triage Vitals  Enc Vitals Group     BP 09/27/22 1951 122/79     Pulse Rate 09/27/22 1948 (!) 132     Resp 09/27/22 1948 (!) 26     Temp 09/27/22 1948 (!) 102.1 F (38.9 C)     Temp Source 09/27/22 1948 Oral     SpO2 09/27/22 1948 95 %     Weight 09/27/22 1950 204 lb 5.9 oz (92.7 kg)     Height --      Head Circumference --      Peak Flow --      Pain Score 09/27/22 1950 0     Pain Loc --      Pain Edu? --      Excl. in [place]? --     Most recent vital signs: Vitals:   09/27/22 1948 09/27/22 1951  BP:  122/79  Pulse: (!) 132   Resp: (!) 26   Temp: (!) 102.1 F (38.9 C)   SpO2: 95%     General: Awake, no distress.  CV:  Good peripheral perfusion.  Regular rate and rhythm  Resp:  Normal effort.  Equal breath sounds bilaterally.  Abd:  No distention.  Soft, nontender.  No rebound or guarding. Abdomen. Other:  Foley catheter in place   ED Results / Procedures / Treatments   EKG  EKG viewed and interpreted by myself shows sinus tachycardia 129 bpm with a narrow QRS,  normal axis, normal intervals, nonspecific ST changes.  RADIOLOGY  I have reviewed and interpreted the chest x-ray images.  Patient appears to have significant cardiomegaly but no obvious consolidation on my evaluation. Radiology has read the x-ray as no acute chest process.   MEDICATIONS ORDERED IN ED: Medications  sodium chloride 0.9 % bolus 1,000 mL (has no administration in time range)  ceFEPIme (MAXIPIME) 2 g in sodium chloride 0.9 % 100 mL IVPB (has no administration in time range)  metroNIDAZOLE (FLAGYL) IVPB 500 mg (has no administration in time range)  vancomycin (VANCOCIN) IVPB 1000 mg/200 mL premix (has no administration in time range)     IMPRESSION / MDM / ASSESSMENT AND PLAN / ED COURSE  I reviewed the triage vital signs and the nursing notes.  Patient's presentation is most consistent with acute presentation with potential threat to life or bodily function.  Patient presents emergency department for generalized weakness found to be febrile tachypneic and tachycardic meeting sepsis criteria.  We will start the patient on broad-spectrum antibiotics.  We will check labs cultures COVID, urinalysis urine culture, chest  x-ray and IV hydrate while awaiting results.  I reviewed the patient's chart from his prior visit 09/08/2022 at which time the patient had a CT scan showing what appeared to be consistent with metastatic prostate cancer also had urinary retention had a Foley catheter placed.  I read Dr. Cherrie Gauze note from 09/20/2022 regarding the patient's plan for prostate cancer treatment.  Given the patient's fever generalized fatigue weakness suspect the patient is septic most likely from a urinary source given the indwelling Foley catheter recently placed 2 weeks ago.  Differential would also of course include other infectious etiology such as COVID, pneumonia, intra-abdominal infections, etc.  Patient's labs have resulted showing renal insufficiency with a creatinine 1.85.  CBC  overall reassuring with a white blood cell count 9.4.  Urinalysis shows significant urinary tract infection nitrite positive with many bacteria and white blood cell clumps.  COVID and flu is negative.  Lactate 1.4.  Patient will be admitted to the hospital service for continued work-up treatment with continued IV antibiotics for the patient's sepsis likely due to urinary tract infection.  CRITICAL CARE Performed by: Harvest Dark   Total critical care time: 30 minutes  Critical care time was exclusive of separately billable procedures and treating other patients.  Critical care was necessary to treat or prevent imminent or life-threatening deterioration.  Critical care was time spent personally by me on the following activities: development of treatment plan with patient and/or surrogate as well as nursing, discussions with consultants, evaluation of patient's response to treatment, examination of patient, obtaining history from patient or surrogate, ordering and performing treatments and interventions, ordering and review of laboratory studies, ordering and review of radiographic studies, pulse oximetry and re-evaluation of patient's condition.   FINAL CLINICAL IMPRESSION(S) / ED DIAGNOSES   Sepsis Urinary tract infection    Note:  This document was prepared using Dragon voice recognition software and may include unintentional dictation errors.   Harvest Dark, MD 09/27/22 2209

## 2022-09-27 NOTE — Assessment & Plan Note (Signed)
Sliding scale coverage 

## 2022-09-27 NOTE — Assessment & Plan Note (Addendum)
Head CT negative.  Much improved.  Secondary to sepsis.

## 2022-09-27 NOTE — Telephone Encounter (Signed)
Patient left VM stating that he is in "terrible pain" and would like a member from the clinical team to call him back.

## 2022-09-27 NOTE — Assessment & Plan Note (Addendum)
Patient met criteria for severe sepsis indwelling Foley catheter present Prostate cancer metastatic to bone Met criteria for severe sepsis on admission secondary to fever, tachycardia and hypotension as well as AKI and acute encephalopathy.  Sepsis stabilizing.  Blood cultures growing out E. coli.  Awaiting sensitivities. 

## 2022-09-27 NOTE — Assessment & Plan Note (Addendum)
Not seeing any evidence of bleeding.  Likely cause is anemia of chronic disease.

## 2022-09-28 ENCOUNTER — Ambulatory Visit: Payer: 59 | Admitting: Urology

## 2022-09-28 DIAGNOSIS — N390 Urinary tract infection, site not specified: Secondary | ICD-10-CM | POA: Diagnosis not present

## 2022-09-28 DIAGNOSIS — R6520 Severe sepsis without septic shock: Secondary | ICD-10-CM

## 2022-09-28 DIAGNOSIS — C61 Malignant neoplasm of prostate: Secondary | ICD-10-CM

## 2022-09-28 DIAGNOSIS — D696 Thrombocytopenia, unspecified: Secondary | ICD-10-CM

## 2022-09-28 DIAGNOSIS — C7951 Secondary malignant neoplasm of bone: Secondary | ICD-10-CM

## 2022-09-28 DIAGNOSIS — D528 Other folate deficiency anemias: Secondary | ICD-10-CM

## 2022-09-28 DIAGNOSIS — E663 Overweight: Secondary | ICD-10-CM | POA: Diagnosis present

## 2022-09-28 DIAGNOSIS — M109 Gout, unspecified: Secondary | ICD-10-CM | POA: Diagnosis not present

## 2022-09-28 DIAGNOSIS — N179 Acute kidney failure, unspecified: Secondary | ICD-10-CM | POA: Diagnosis not present

## 2022-09-28 DIAGNOSIS — G9341 Metabolic encephalopathy: Secondary | ICD-10-CM | POA: Diagnosis not present

## 2022-09-28 DIAGNOSIS — A419 Sepsis, unspecified organism: Secondary | ICD-10-CM | POA: Diagnosis not present

## 2022-09-28 HISTORY — DX: Thrombocytopenia, unspecified: D69.6

## 2022-09-28 LAB — COMPREHENSIVE METABOLIC PANEL
ALT: 13 U/L (ref 0–44)
AST: 29 U/L (ref 15–41)
Albumin: 2 g/dL — ABNORMAL LOW (ref 3.5–5.0)
Alkaline Phosphatase: 242 U/L — ABNORMAL HIGH (ref 38–126)
Anion gap: 8 (ref 5–15)
BUN: 54 mg/dL — ABNORMAL HIGH (ref 8–23)
CO2: 24 mmol/L (ref 22–32)
Calcium: 7.9 mg/dL — ABNORMAL LOW (ref 8.9–10.3)
Chloride: 104 mmol/L (ref 98–111)
Creatinine, Ser: 2.1 mg/dL — ABNORMAL HIGH (ref 0.61–1.24)
GFR, Estimated: 35 mL/min — ABNORMAL LOW (ref >60)
Glucose, Bld: 119 mg/dL — ABNORMAL HIGH (ref 70–99)
Potassium: 3.8 mmol/L (ref 3.5–5.1)
Sodium: 136 mmol/L (ref 135–145)
Total Bilirubin: 1.1 mg/dL (ref 0.3–1.2)
Total Protein: 5.7 g/dL — ABNORMAL LOW (ref 6.5–8.1)

## 2022-09-28 LAB — MAGNESIUM: Magnesium: 1.9 mg/dL (ref 1.7–2.4)

## 2022-09-28 LAB — BLOOD CULTURE ID PANEL (REFLEXED) - BCID2

## 2022-09-28 LAB — DIFFERENTIAL
Abs Immature Granulocytes: 0.43 10*3/uL — ABNORMAL HIGH (ref 0.00–0.07)
Basophils Absolute: 0 10*3/uL (ref 0.0–0.1)
Basophils Relative: 1 %
Eosinophils Absolute: 0.1 10*3/uL (ref 0.0–0.5)
Eosinophils Relative: 1 %
Immature Granulocytes: 7 %
Lymphocytes Relative: 6 %
Lymphs Abs: 0.4 10*3/uL — ABNORMAL LOW (ref 0.7–4.0)
Monocytes Absolute: 0.2 10*3/uL (ref 0.1–1.0)
Monocytes Relative: 4 %
Neutro Abs: 4.9 10*3/uL (ref 1.7–7.7)
Neutrophils Relative %: 81 %
Smear Review: NORMAL

## 2022-09-28 LAB — CBC
HCT: 24.5 % — ABNORMAL LOW (ref 39.0–52.0)
Hemoglobin: 8.2 g/dL — ABNORMAL LOW (ref 13.0–17.0)
MCH: 33.5 pg (ref 26.0–34.0)
MCHC: 33.5 g/dL (ref 30.0–36.0)
MCV: 100 fL (ref 80.0–100.0)
Platelets: 64 10*3/uL — ABNORMAL LOW (ref 150–400)
RBC: 2.45 MIL/uL — ABNORMAL LOW (ref 4.22–5.81)
RDW: 13 % (ref 11.5–15.5)
WBC: 6.1 10*3/uL (ref 4.0–10.5)
nRBC: 0 % (ref 0.0–0.2)

## 2022-09-28 LAB — HEMOGLOBIN A1C
Hgb A1c MFr Bld: 6.5 % — ABNORMAL HIGH (ref 4.8–5.6)
Mean Plasma Glucose: 139.85 mg/dL

## 2022-09-28 LAB — LACTIC ACID, PLASMA: Lactic Acid, Venous: 1.3 mmol/L (ref 0.5–1.9)

## 2022-09-28 LAB — GLUCOSE, CAPILLARY
Glucose-Capillary: 113 mg/dL — ABNORMAL HIGH (ref 70–99)
Glucose-Capillary: 109 mg/dL — ABNORMAL HIGH (ref 70–99)
Glucose-Capillary: 98 mg/dL (ref 70–99)
Glucose-Capillary: 96 mg/dL (ref 70–99)

## 2022-09-28 LAB — MRSA NEXT GEN BY PCR, NASAL: MRSA by PCR Next Gen: NOT DETECTED

## 2022-09-28 LAB — PHOSPHORUS: Phosphorus: 4.1 mg/dL (ref 2.5–4.6)

## 2022-09-28 LAB — HIV ANTIBODY (ROUTINE TESTING W REFLEX): HIV Screen 4th Generation wRfx: NONREACTIVE

## 2022-09-28 MED ORDER — SODIUM CHLORIDE 0.9 % IV SOLN: INTRAVENOUS | Status: DC

## 2022-09-28 MED ORDER — SODIUM CHLORIDE 0.9 % IV SOLN
2.0000 g | INTRAVENOUS | Status: DC
  Administered 2022-09-28 – 2022-10-01 (×4): 2 g via INTRAVENOUS
  Filled 2022-09-28: qty 20
  Filled 2022-09-28 (×3): qty 2

## 2022-09-28 MED ORDER — CHLORHEXIDINE GLUCONATE CLOTH 2 % EX PADS
6.0000 | MEDICATED_PAD | Freq: Every day | CUTANEOUS | Status: DC
  Administered 2022-09-29 – 2022-10-06 (×7): 6 via TOPICAL

## 2022-09-28 MED ORDER — METHYLPREDNISOLONE SODIUM SUCC 125 MG IJ SOLR
80.0000 mg | INTRAMUSCULAR | Status: DC
  Administered 2022-09-28 – 2022-09-29 (×2): 80 mg via INTRAVENOUS
  Filled 2022-09-28 (×3): qty 2

## 2022-09-28 MED ORDER — FOLIC ACID 1 MG PO TABS
1.0000 mg | ORAL_TABLET | Freq: Every day | ORAL | Status: DC
  Administered 2022-09-29 – 2022-09-30 (×2): 1 mg via ORAL
  Filled 2022-09-28 (×2): qty 1

## 2022-09-28 MED ORDER — TRAMADOL HCL 50 MG PO TABS
50.0000 mg | ORAL_TABLET | Freq: Four times a day (QID) | ORAL | Status: DC | PRN
  Administered 2022-09-28 – 2022-09-29 (×3): 50 mg via ORAL
  Filled 2022-09-28 (×3): qty 1

## 2022-09-28 MED ORDER — LACTATED RINGERS IV BOLUS (SEPSIS)
1000.0000 mL | Freq: Once | INTRAVENOUS | Status: AC
  Administered 2022-09-28: 1000 mL via INTRAVENOUS

## 2022-09-28 NOTE — Plan of Care (Signed)
Continuing with plan of care. 

## 2022-09-28 NOTE — Hospital Course (Addendum)
66-yom w/ recent diagnosis of prostate cancer with bone metastases, hypertension, diabetes mellitus and gout who has been noncompliant with medications and presented to the emergency room on 11/2 for confusion and disorientation.   In ED: found to have severe sepsis felt to be secondary to UTI from indwelling Foley catheter.  Patient started on IV fluids and antibiotics and placed in stepdown unit.  11/3, patient more alert and appropriate and transferred to medical floor. Blood cultures growing out E. coli.  Urine culture also positive for E. Coli, Klebsiella oxytoca, Enterococcus faecalis.  Patient initially treated with Rocephin>changed to Unasyn. Patient had a urinary retention secondary to blocked urinary catheter 11/4, flushed, resolved.  Patient also developed significant agitation on 11/4 and with his heavy alcohol use history- started CIWA protocol.  Psychiatry was consulted He had drop in hemoglobin 1 unit PRBC transfused 11/9 overall leg pain knee pain improving and mental status much better alert awake on 11/9

## 2022-09-28 NOTE — Progress Notes (Signed)
PHARMACY - PHYSICIAN COMMUNICATION CRITICAL VALUE ALERT - BLOOD CULTURE IDENTIFICATION (BCID)  Roger [name] is an 66 y.o. male who presented to [facility] on 09/27/2022 with a chief complaint of weakness  Assessment:  4/4 bottles, GNRs. BCID shows E. Coli, no resistance detected. Source likely to be indwelling catheter placed on 10/26 by urology for urinary retention and metastatic prostate cancer.  Name of physician (or Provider) Contacted: Gevena Barre  Current antibiotics: Cefepime, Vancomycin, Metronidazole  Changes to prescribed antibiotics recommended: D/C vancomycin, metronidazole and change cefepime to ceftriaxone 2 g IV q24H  Results for orders placed or performed during the hospital encounter of 09/27/22  Blood Culture ID Panel (Reflexed) (Collected: 09/27/2022  7:53 PM)  Result Value Ref Range   Enterococcus faecalis NOT DETECTED NOT DETECTED   Enterococcus Faecium NOT DETECTED NOT DETECTED   Listeria monocytogenes NOT DETECTED NOT DETECTED   Staphylococcus species NOT DETECTED NOT DETECTED   Staphylococcus aureus (BCID) NOT DETECTED NOT DETECTED   Staphylococcus epidermidis NOT DETECTED NOT DETECTED   Staphylococcus lugdunensis NOT DETECTED NOT DETECTED   Streptococcus species NOT DETECTED NOT DETECTED   Streptococcus agalactiae NOT DETECTED NOT DETECTED   Streptococcus pneumoniae NOT DETECTED NOT DETECTED   Streptococcus pyogenes NOT DETECTED NOT DETECTED   A.calcoaceticus-baumannii NOT DETECTED NOT DETECTED   Bacteroides fragilis NOT DETECTED NOT DETECTED   Enterobacterales DETECTED (A) NOT DETECTED   Enterobacter cloacae complex NOT DETECTED NOT DETECTED   Escherichia coli DETECTED (A) NOT DETECTED   Klebsiella aerogenes NOT DETECTED NOT DETECTED   Klebsiella oxytoca NOT DETECTED NOT DETECTED   Klebsiella pneumoniae NOT DETECTED NOT DETECTED   Proteus species NOT DETECTED NOT DETECTED   Salmonella species NOT DETECTED NOT DETECTED   Serratia marcescens NOT  DETECTED NOT DETECTED   Haemophilus influenzae NOT DETECTED NOT DETECTED   Neisseria meningitidis NOT DETECTED NOT DETECTED   Pseudomonas aeruginosa NOT DETECTED NOT DETECTED   Stenotrophomonas maltophilia NOT DETECTED NOT DETECTED   Candida albicans NOT DETECTED NOT DETECTED   Candida auris NOT DETECTED NOT DETECTED   Candida glabrata NOT DETECTED NOT DETECTED   Candida krusei NOT DETECTED NOT DETECTED   Candida parapsilosis NOT DETECTED NOT DETECTED   Candida tropicalis NOT DETECTED NOT DETECTED   Cryptococcus neoformans/gattii NOT DETECTED NOT DETECTED   CTX-M ESBL NOT DETECTED NOT DETECTED   Carbapenem resistance IMP NOT DETECTED NOT DETECTED   Carbapenem resistance KPC NOT DETECTED NOT DETECTED   Carbapenem resistance NDM NOT DETECTED NOT DETECTED   Carbapenem resist OXA 48 LIKE NOT DETECTED NOT DETECTED   Carbapenem resistance VIM NOT DETECTED NOT DETECTED   Dara Hoyer, PharmD PGY-1 Pharmacy Resident 09/28/2022 8:46 AM

## 2022-09-28 NOTE — Progress Notes (Signed)
[facility] Progress Note  Patient: Roger [name]    TXM:[id number]  DOA: 09/27/2022    Date of Service: the patient was seen and examined on 09/28/2022  Brief hospital course: Patient is a 66 year old with past medical history of recent diagnosis of prostate cancer with bone metastases, hypertension, diabetes mellitus and gout who has been noncompliant with medications and presented to the emergency room on 11/2 for confusion and disorientation.  Reportedly, patient had been on the phone on morning of presentation with radiology to set up appointment for bone scan when he appeared to be sounding quite confused on the phone.  Radiology department was able to contact patient's sons who then brought him into the emergency room.  Work-up in the emergency room revealed severe sepsis felt to be secondary to UTI from indwelling Foley catheter.  Patient started on IV fluids and antibiotics and placed in stepdown unit.  By 11/3, patient more alert and appropriate and transferred to medical floor.  Assessment and Plan: Assessment and Plan: * Severe sepsis secondary to UTI [facility]) Patient met criteria for severe sepsis indwelling Foley catheter present Prostate cancer metastatic to bone Met criteria for severe sepsis on admission secondary to fever, tachycardia and hypotension as well as AKI and acute encephalopathy.  Sepsis stabilizing.  Blood cultures growing out E. coli.  Awaiting sensitivities.  Acute metabolic encephalopathy Head CT negative.  Much improved.  Secondary to sepsis.  AKI (acute kidney injury) ([place]) Likely secondary to sepsis plus gout arthropathy.  Creatinine actually worse today at 2.1, will continue IV fluids.  Try to avoid nephrotoxins.  Acute gouty arthropathy According to oncology, patient has been noncompliant with some of his medications including his gout ones.  For now, we will treat with pain medication as well as steroids.  Indwelling Foley catheter  present Underlying cause of UTI causing sepsis  Prostate cancer metastatic to bone [facility]) Oncology will be following.  Anemia Not seeing any evidence of bleeding.  Likely cause is anemia of chronic disease.  Essential hypertension Hold BP meds due to softening BP  Diabetes mellitus without complication (HCC) Sliding scale coverage  Overweight (BMI 25.0-29.9) Patient meets criteria BMI greater than 25       Body mass index is 29.85 kg/m.        Consultants: Oncology  Procedures: None  Antimicrobials: IV cefepime, vancomycin and Flagyl 11/2 only IV Rocephin 11/3-present  Code Status: Full code   Subjective: Patient complains of gout pain in his right foot  Objective: Vital signs stable Vitals:   09/28/22 1400 09/28/22 1502  BP: (!) 123/96 132/78  Pulse: 94 98  Resp: 16 18  Temp:  (!) 97.5 F (36.4 C)  SpO2: 98% 99%    Intake/Output Summary (Last 24 hours) at 09/28/2022 1659 Last data filed at 09/28/2022 1217 Gross per 24 hour  Intake 2015.75 ml  Output 150 ml  Net 1865.75 ml   Filed Weights   09/27/22 1950 09/28/22 0301  Weight: 92.7 kg 91.7 kg   Body mass index is 29.85 kg/m.  Exam:  General: Alert and oriented x3, no acute distress HEENT: Normocephalic, atraumatic, mucous membranes slightly dry Cardiovascular: Regular rate and rhythm, S1-S2 Respiratory: Clear to auscultation bilaterally Abdomen: Soft, nontender, nondistended, positive bowel sounds Musculoskeletal: No clubbing or cyanosis or edema Skin: No skin breaks, tears or lesions.  Noted to have some generalized tenderness on right toes, but no tophaceous gout Psychiatry: At times slightly slow to respond, but overall appropriate Neurology: No focal deficits  Data Reviewed: Hemoglobin of 8.2, creatinine of 2.1  Disposition:  Status is: Inpatient Remains inpatient appropriate because:  -Treatment of infection -Improvement in renal function -Treatment of gout    Anticipated  discharge date: 11/5   Family Communication: Wife at the bedside DVT Prophylaxis: enoxaparin (LOVENOX) injection 40 mg Start: 09/28/22 0800    Author: Annita Brod ,MD 09/28/2022 4:59 PM  To reach On-call, see care teams to locate the attending and reach out via [email or web address]. Between 7PM-7AM, please contact night-coverage If you still have difficulty reaching the attending provider, please page the [facility] (Director on Call) for [facility] on amion for assistance.

## 2022-09-28 NOTE — Assessment & Plan Note (Signed)
Patient meets criteria BMI greater than 25

## 2022-09-28 NOTE — Assessment & Plan Note (Signed)
Underlying cause of UTI causing sepsis

## 2022-09-28 NOTE — Telephone Encounter (Signed)
Looks like pt is currently admitted.

## 2022-09-28 NOTE — Plan of Care (Signed)

## 2022-09-28 NOTE — Progress Notes (Signed)
Report give to receiving nurse on [place], patient is in stable condition and transferred to unit on hospital bed.

## 2022-09-28 NOTE — Assessment & Plan Note (Signed)
Oncology will be following.

## 2022-09-28 NOTE — Consult Note (Signed)
Hematology/Oncology Consult note [phone] [phone]      Patient Care Team: Lahoma Rocker, MD as PCP - General (Rheumatology)   Name of the patient: Roger [name]  [id number]  04/17/1956   REASON FOR COSULTATION:  Prostate cancer with mets to Bone.   History of presenting illness-  65 y.o. male with PMH listed at below who was sent to emergency room for evaluation of change in mental status Patient was on the phone to set up appointment for bone scan and was found to be quite confused.  Patient's family members brought patient to hospital for further evaluation. Patient has indwelling Foley catheter placed recently. Work-up in the emergency room showed severe sepsis, Tmax 102.1, UA positive. patient was started on antibiotics, admit to stepdown unit  Today's mental status improved.   He was seen by me recently due to concern of possible prostate cancer with metastasis to lymph nodes and extensive bone metastasis.  He also was found to have elevated uric acid.  He has longstanding history and is not compliant with her allopurinol and colchicine. In emergency room, patient was found to have AKI secondary to sepsis and gout flare.  On IV fluid hydration. Urine culture grew E. Coli.  Patient complains of right lower extremity pain.   Allergies  Allergen Reactions   Albuterol Nausea And Vomiting   Mucinex [Guaifenesin Er] Rash    Patient Active Problem List   Diagnosis Date Noted   Elevated PSA 09/24/2022    Priority: High   Alcohol use 09/24/2022    Priority: Medium    Acute gout 09/24/2022    Priority: Medium    Folate deficiency 09/24/2022    Priority: Medium    Overweight (BMI 25.0-29.9) 09/28/2022   Severe sepsis secondary to UTI ([place]) 09/27/2022   Indwelling Foley catheter present 09/27/2022   Prostate cancer metastatic to bone ([place]) 09/27/2022   AKI (acute kidney injury) ([place]) 71/69/6789   Acute metabolic encephalopathy 38/08/1750    Lymphadenopathy 09/24/2022   HLD (hyperlipidemia) 04/10/2021   Anemia 07/21/2018   Acute gouty arthropathy 05/18/2016   Erectile dysfunction 05/18/2016   Diabetes mellitus without complication ([place]) 02/58/5277   Essential hypertension 04/16/2016     Past Medical History:  Diagnosis Date   Chest pain    a. 04/2016 MV: small, mild apical defect c/w apical ischemia, EF 55-65%-->Low risk study.   Diabetes mellitus without complication ([place])    Gout    Hematochezia    History of echocardiogram    a. 04/2016 Echo: EF 55-60%, mild LVH, no rwma, midly dil LA.   History of heart attack    Hypertension    Morbid obesity ([place])      Past Surgical History:  Procedure Laterality Date   COLONOSCOPY WITH PROPOFOL N/A 08/05/2018   Procedure: COLONOSCOPY WITH PROPOFOL;  Surgeon: Lin Landsman, MD;  Location[facility] END[facility]COPY;  Service: Gastroenterology;  Laterality: N/A;   thumb surgery      Social History   Socioeconomic History   Marital status: Divorced    Spouse name: Not on file   Number of children: 5   Years of education: Not on file   Highest education level: High school graduate  Occupational History   Not on file  Tobacco Use   Smoking status: Former    Packs/day: 0.25    Years: 25.00    Total pack years: 6.25    Types: Cigarettes    Quit date: 43    Years since quitting:  29.8   Smokeless tobacco: Never  Vaping Use   Vaping Use: Never used  Substance and Sexual Activity   Alcohol use: Yes    Alcohol/week: 42.0 standard drinks of alcohol    Types: 10 Cans of beer, 32 Shots of liquor per week    Comment: everyday. 3-5 servings whiskey nightly & beer   Drug use: No   Sexual activity: Not Currently  Other Topics Concern   Not on file  Social History Narrative   Not on file   Social Determinants of Health   Financial Resource Strain: Not on file  Food Insecurity: Not on file  Transportation Needs: Not on file  Physical Activity: Not on file  Stress: Not on  file  Social Connections: Not on file  Intimate Partner Violence: Not on file     Family History  Problem Relation Age of Onset   Heart Problems Mother    Breast cancer Mother    Mental illness Father    Heart disease Father    Stroke Father    Diabetes Father    Alcohol abuse Father    Bipolar disorder Father    Heart disease Other    Hypertension Other    Diabetes Other      Current Facility-Administered Medications:    0.9 %  sodium chloride infusion, , Intravenous, Continuous, Annita Brod, MD, Last Rate: 100 mL/hr at 09/28/22 1930, New Bag at 09/28/22 1930   acetaminophen (TYLENOL) tablet 650 mg, 650 mg, Oral, Q6H PRN, 650 mg at 09/28/22 2028 **OR** acetaminophen (TYLENOL) suppository 650 mg, 650 mg, Rectal, Q6H PRN, Annita Brod, MD   cefTRIAXone (ROCEPHIN) 2 g in sodium chloride 0.9 % 100 mL IVPB, 2 g, Intravenous, Q24H, Annita Brod, MD, Stopped at 09/28/22 1102   Chlorhexidine Gluconate Cloth 2 % PADS 6 each, 6 each, Topical, Daily, Annita Brod, MD   enoxaparin (LOVENOX) injection 40 mg, 40 mg, Subcutaneous, Q24H, Gevena Barre K, MD, 40 mg at 09/28/22 1032   insulin aspart (novoLOG) injection 0-15 Units, 0-15 Units, Subcutaneous, Q4H, Annita Brod, MD, 2 Units at 09/27/22 2340   methylPREDNISolone sodium succinate (SOLU-MEDROL) 125 mg/2 mL injection 80 mg, 80 mg, Intravenous, Q24H, Annita Brod, MD, 80 mg at 09/28/22 1956   ondansetron (ZOFRAN) tablet 4 mg, 4 mg, Oral, Q6H PRN **OR** ondansetron (ZOFRAN) injection 4 mg, 4 mg, Intravenous, Q6H PRN, Annita Brod, MD   traMADol (ULTRAM) tablet 50 mg, 50 mg, Oral, Q6H PRN, Annita Brod, MD, 50 mg at 09/28/22 1820  Review of Systems  Unable to perform ROS: Mental status change  Constitutional:  Positive for fatigue.  Respiratory:  Negative for shortness of breath.   Cardiovascular:  Negative for chest pain.  Gastrointestinal:  Negative for abdominal pain.  Musculoskeletal:   Positive for arthralgias.       Right foot pain  Psychiatric/Behavioral:  Positive for confusion.     PHYSICAL EXAM Vitals:   09/28/22 1300 09/28/22 1400 09/28/22 1502 09/28/22 1923  BP:  (!) 123/96 132/78 (!) 178/91  Pulse: 93 94 98 (!) 107  Resp: '19 16 18 18  '$ Temp:   (!) 97.5 F (36.4 C) 98.2 F (36.8 C)  TempSrc:   Oral   SpO2: 96% 98% 99% 100%  Weight:      Height:       Physical Exam Constitutional:      General: He is not in acute distress. HENT:     Head: Normocephalic.  Nose: Nose normal.     Mouth/Throat:     Pharynx: No oropharyngeal exudate.  Eyes:     General: No scleral icterus.    Pupils: Pupils are equal, round, and reactive to light.  Cardiovascular:     Rate and Rhythm: Normal rate.     Heart sounds: No murmur heard. Pulmonary:     Effort: Pulmonary effort is normal. No respiratory distress.  Abdominal:     General: There is no distension.     Palpations: Abdomen is soft.     Tenderness: There is no abdominal tenderness.  Musculoskeletal:        General: Tenderness present.     Cervical back: Normal range of motion.     Comments: Right foot tenderness  Skin:    General: Skin is warm and dry.     Findings: No erythema.  Neurological:     Mental Status: He is alert.     Cranial Nerves: No cranial nerve deficit.     Motor: No abnormal muscle tone.     Comments: Mental status has improved, slow speech  Psychiatric:        Mood and Affect: Affect normal.       LABORATORY STUDIES    Latest Ref Rng & Units 09/28/2022    6:09 AM 09/27/2022    7:54 PM 09/08/2022    8:26 AM  CBC  WBC 4.0 - 10.5 K/uL 6.1  9.4  17.6   Hemoglobin 13.0 - 17.0 g/dL 8.2  9.9  11.6   Hematocrit 39.0 - 52.0 % 24.5  30.0  35.4   Platelets 150 - 400 K/uL 64  109  128       Latest Ref Rng & Units 09/28/2022    6:09 AM 09/27/2022    7:54 PM 09/08/2022    8:26 AM  CMP  Glucose 70 - 99 mg/dL 119  160  117   BUN 8 - 23 mg/dL 54  51  15   Creatinine 0.61 - 1.24  mg/dL 2.10  1.85  1.00   Sodium 135 - 145 mmol/L 136  133  139   Potassium 3.5 - 5.1 mmol/L 3.8  4.3  3.6   Chloride 98 - 111 mmol/L 104  98  102   CO2 22 - 32 mmol/L '24  23  24   '$ Calcium 8.9 - 10.3 mg/dL 7.9  7.7  8.3   Total Protein 6.5 - 8.1 g/dL 5.7  6.6  6.6   Total Bilirubin 0.3 - 1.2 mg/dL 1.1  1.4  1.0   Alkaline Phos 38 - 126 U/L 242  295  411   AST 15 - 41 U/L '29  26  26   '$ ALT 0 - 44 U/L '13  11  12      '$ RADIOGRAPHIC STUDIES: I have personally reviewed the radiological images as listed and agreed with the findings in the report. DG Chest Port 1 View  Result Date: 09/27/2022 CLINICAL DATA:  Generalized weakness and low energy. Low-grade fever. Questionable sepsis. Check for infiltrate. EXAM: PORTABLE CHEST 1 VIEW COMPARISON:  PA Lat chest 04/01/2021 FINDINGS: There is a low inspiration exaggerating the mediastinum and heart. There is mild-to-moderate cardiomegaly. Aortic tortuosity and ectasia are noted with patchy calcification of the transverse segment. The hypoinflated lungs appear generally clear but there is limited view of bases. No pleural effusion is seen. Thoracic spondylosis. There is a history of prostate cancer with bone metastases. Recent abdomen and pelvis CT 09/08/2022 demonstrated  widespread bony metastases in the ribcage, spine and pelvis. There are sclerotic lesions in both proximal humeri which are probably also metastatic. IMPRESSION: 1. Low inspiration exaggerating the mediastinum and heart. No evidence of acute chest process. Limited view of the bases. 2. Cardiomegaly. 3. Aortic atherosclerosis. 4. Widespread bony metastases. Electronically Signed   By: Telford Nab M.D.   On: 09/27/2022 20:27   US Venous Img Lower Unilateral Right  Result Date: 09/08/2022 CLINICAL DATA:  Edema in right lower extremity EXAM: Right LOWER EXTREMITY VENOUS DOPPLER ULTRASOUND TECHNIQUE: Gray-scale sonography with compression, as well as color and duplex ultrasound, were performed to  evaluate the deep venous system(s) from the level of the common femoral vein through the popliteal and proximal calf veins. COMPARISON:  None Available. FINDINGS: VENOUS Normal compressibility of the common femoral, superficial femoral, and popliteal veins, as well as the visualized calf veins. Visualized portions of profunda femoral vein and great saphenous vein unremarkable. No filling defects to suggest DVT on grayscale or color Doppler imaging. Doppler waveforms show normal direction of venous flow, normal respiratory plasticity and response to augmentation. Limited views of the contralateral common femoral vein are unremarkable. OTHER None. Limitations: none IMPRESSION: There is no evidence of deep venous thrombosis in right lower extremity. Electronically Signed   By: Elmer Picker M.D.   On: 09/08/2022 15:38   CT ABDOMEN PELVIS W CONTRAST  Result Date: 09/08/2022 CLINICAL DATA:  Abdominal pain * Tracking Code: BO * EXAM: CT ABDOMEN AND PELVIS WITH CONTRAST TECHNIQUE: Multidetector CT imaging of the abdomen and pelvis was performed using the standard protocol following bolus administration of intravenous contrast. RADIATION DOSE REDUCTION: This exam was performed according to the departmental dose-optimization program which includes automated exposure control, adjustment of the mA and/or kV according to patient size and/or use of iterative reconstruction technique. CONTRAST:  165m OMNIPAQUE IOHEXOL 300 MG/ML  SOLN COMPARISON:  None Available. FINDINGS: Lower chest: No acute abnormality. Hepatobiliary: No solid liver abnormality is seen. Hepatic steatosis. Small gallstones at the gallbladder fundus. No gallbladder wall thickening, or biliary dilatation. Pancreas: Unremarkable. No pancreatic ductal dilatation or surrounding inflammatory changes. Spleen: Normal in size without significant abnormality. Adrenals/Urinary Tract: Adrenal glands are unremarkable. Mild bilateral hydronephrosis. No calculi or  other obstructing etiology identified to the ureterovesicular junctions distended urinary bladder. Stomach/Bowel: Stomach is within normal limits. Appendix appears normal. No evidence of bowel wall thickening, distention, or inflammatory changes. Pancolonic diverticulosis Vascular/Lymphatic: Aortic atherosclerosis. Numerous enlarged bilateral pelvic sidewall, iliac, and retroperitoneal lymph nodes largest left retroperitoneal lymph nodes measuring up to 1.8 x 1.4 cm (series 2, image 44), left iliac nodes measuring up series 0.2 x 1.5 cm series 2, image 4) Reproductive: Prostatomegaly with indistinct margins, particularly at the prostatic base (series 6, 81). Other: No abdominal wall hernia or abnormality. No ascites. Musculoskeletal: No acute osseous findings widespread sclerotic osseous metastatic throughout and proximal appendicular skeleton IMPRESSION: 1. Mild bilateral hydronephrosis and distended urinary bladder, consistent with bladder outlet obstruction and hydronephrosis secondary to back pressure. No urinary tract calculi. 2. Prostatomegaly with indistinct margins, particularly at the prostatic base. 3. Numerous enlarged bilateral pelvic sidewall, iliac, and retroperitoneal lymph nodes. 4. 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 5. Constellation of findings is consistent prostate malignancy and associated metastatic disease. 6. Hepatic steatosis. 7. Cholelithiasis without evidence of acute cholecystitis. 8. Pancolonic diverticulosis without evidence of acute diverticulitis. Aortic Atherosclerosis (ICD10-I70.0). Electronically Signed   By: ADelanna AhmadiM.D.   On: 09/08/2022 12:15  Assessment and plan-   Sepsis due to UTI, indwelling Foley catheter present due to obstructive uropathy secondary to enlarged prostate. Urine culture + E Coli Blood culture + gram-negative rods Follow culture sensitivity, continue IV antibiotics. Mental status has  improved.  Acute kidney injury, secondary to gout and sepsis.  Continue IV fluid. Acute gout flare, he is not compliant about medication. Continue pain medication  Elevated PSA,>1500,  CT scans concerning for prostate cancer with lymph node involvement as well as extensive bone metastasis. There was plan for outpatient IR biopsy of inguinal lymph node which expect to be further delayed secondary to current admission. I recommend consult IR for inguinal lymph node biopsy during current admission to expedite cancer diagnosis and treatments.    Acute on chronic anemia, thrombocytopenia Likely secondary to marrow suppression due to severe sepsis.  Check reticulocyte panel, immature platelet fraction, smear. folate deficiency.  Recommend folic acid supplementation.   Thank you for allowing me to participate in the care of this patient.   Earlie Server, MD, PhD Hematology Oncology 09/28/2022

## 2022-09-29 ENCOUNTER — Inpatient Hospital Stay: Payer: 59

## 2022-09-29 DIAGNOSIS — G9341 Metabolic encephalopathy: Secondary | ICD-10-CM | POA: Diagnosis not present

## 2022-09-29 DIAGNOSIS — C61 Malignant neoplasm of prostate: Secondary | ICD-10-CM | POA: Diagnosis not present

## 2022-09-29 DIAGNOSIS — A4151 Sepsis due to Escherichia coli [E. coli]: Secondary | ICD-10-CM | POA: Insufficient documentation

## 2022-09-29 DIAGNOSIS — A419 Sepsis, unspecified organism: Secondary | ICD-10-CM | POA: Diagnosis not present

## 2022-09-29 DIAGNOSIS — N390 Urinary tract infection, site not specified: Secondary | ICD-10-CM | POA: Diagnosis not present

## 2022-09-29 LAB — GLUCOSE, CAPILLARY
Glucose-Capillary: 128 mg/dL — ABNORMAL HIGH (ref 70–99)
Glucose-Capillary: 131 mg/dL — ABNORMAL HIGH (ref 70–99)
Glucose-Capillary: 162 mg/dL — ABNORMAL HIGH (ref 70–99)
Glucose-Capillary: 166 mg/dL — ABNORMAL HIGH (ref 70–99)
Glucose-Capillary: 169 mg/dL — ABNORMAL HIGH (ref 70–99)
Glucose-Capillary: 95 mg/dL (ref 70–99)

## 2022-09-29 LAB — TECHNOLOGIST SMEAR REVIEW: Plt Morphology: NORMAL

## 2022-09-29 LAB — BASIC METABOLIC PANEL
Anion gap: 14 (ref 5–15)
BUN: 59 mg/dL — ABNORMAL HIGH (ref 8–23)
CO2: 17 mmol/L — ABNORMAL LOW (ref 22–32)
Calcium: 8.4 mg/dL — ABNORMAL LOW (ref 8.9–10.3)
Chloride: 103 mmol/L (ref 98–111)
Creatinine, Ser: 2.04 mg/dL — ABNORMAL HIGH (ref 0.61–1.24)
GFR, Estimated: 36 mL/min — ABNORMAL LOW (ref >60)
Glucose, Bld: 152 mg/dL — ABNORMAL HIGH (ref 70–99)
Potassium: 4.4 mmol/L (ref 3.5–5.1)
Sodium: 134 mmol/L — ABNORMAL LOW (ref 135–145)

## 2022-09-29 LAB — CBC
HCT: 29 % — ABNORMAL LOW (ref 39.0–52.0)
Hemoglobin: 9.6 g/dL — ABNORMAL LOW (ref 13.0–17.0)
MCH: 32.9 pg (ref 26.0–34.0)
MCHC: 33.1 g/dL (ref 30.0–36.0)
MCV: 99.3 fL (ref 80.0–100.0)
Platelets: 67 10*3/uL — ABNORMAL LOW (ref 150–400)
RBC: 2.92 MIL/uL — ABNORMAL LOW (ref 4.22–5.81)
RDW: 13.2 % (ref 11.5–15.5)
WBC: 6.2 10*3/uL (ref 4.0–10.5)
nRBC: 0 % (ref 0.0–0.2)

## 2022-09-29 LAB — RETIC PANEL
Immature Retic Fract: 5.9 % (ref 2.3–15.9)
RBC.: 2.9 MIL/uL — ABNORMAL LOW (ref 4.22–5.81)
Retic Count, Absolute: 25.5 10*3/uL (ref 19.0–186.0)
Retic Ct Pct: 0.9 % (ref 0.4–3.1)
Reticulocyte Hemoglobin: 30 pg (ref >27.9)

## 2022-09-29 LAB — PROCALCITONIN: Procalcitonin: 38.47 ng/mL

## 2022-09-29 LAB — IMMATURE PLATELET FRACTION: Immature Platelet Fraction: 3.5 % (ref 1.2–8.6)

## 2022-09-29 MED ORDER — OXYCODONE-ACETAMINOPHEN 7.5-325 MG PO TABS
1.0000 | ORAL_TABLET | ORAL | Status: DC | PRN
  Administered 2022-09-29 – 2022-10-02 (×4): 1 via ORAL
  Filled 2022-09-29 (×6): qty 1

## 2022-09-29 MED ORDER — METOPROLOL TARTRATE 25 MG PO TABS
25.0000 mg | ORAL_TABLET | Freq: Two times a day (BID) | ORAL | Status: DC
  Administered 2022-09-29 – 2022-10-06 (×15): 25 mg via ORAL
  Filled 2022-09-29 (×15): qty 1

## 2022-09-29 MED ORDER — ISOSORBIDE MONONITRATE ER 30 MG PO TB24
30.0000 mg | ORAL_TABLET | Freq: Every day | ORAL | Status: DC
  Administered 2022-09-29 – 2022-10-06 (×8): 30 mg via ORAL
  Filled 2022-09-29 (×8): qty 1

## 2022-09-29 MED ORDER — ALLOPURINOL 100 MG PO TABS
200.0000 mg | ORAL_TABLET | Freq: Every day | ORAL | Status: DC
  Administered 2022-09-30 – 2022-10-05 (×6): 200 mg via ORAL
  Filled 2022-09-29 (×6): qty 2

## 2022-09-29 MED ORDER — ASPIRIN 81 MG PO TBEC
81.0000 mg | DELAYED_RELEASE_TABLET | Freq: Every day | ORAL | Status: DC
  Administered 2022-09-29 – 2022-10-06 (×8): 81 mg via ORAL
  Filled 2022-09-29 (×8): qty 1

## 2022-09-29 MED ORDER — ROSUVASTATIN CALCIUM 10 MG PO TABS
10.0000 mg | ORAL_TABLET | Freq: Every day | ORAL | Status: DC
  Administered 2022-09-29 – 2022-10-06 (×7): 10 mg via ORAL
  Filled 2022-09-29 (×8): qty 1

## 2022-09-29 NOTE — Plan of Care (Signed)
  Problem: Respiratory: Goal: Ability to maintain adequate ventilation will improve Outcome: Progressing   Problem: Activity: Goal: Risk for activity intolerance will decrease Outcome: Not Progressing   Problem: Nutrition: Goal: Adequate nutrition will be maintained Outcome: Not Progressing   Problem: Coping: Goal: Level of anxiety will decrease Outcome: Not Progressing   Problem: Elimination: Goal: Will not experience complications related to urinary retention Outcome: Progressing   Problem: Pain Managment: Goal: General experience of comfort will improve Outcome: Not Progressing   Problem: Safety: Goal: Ability to remain free from injury will improve Outcome: Progressing   Problem: Skin Integrity: Goal: Risk for impaired skin integrity will decrease Outcome: Progressing

## 2022-09-29 NOTE — Progress Notes (Signed)
Progress Note   Patient: Roger [name] BXU:[id number] DOB: 1956-08-10 DOA: 09/27/2022     2 DOS: the patient was seen and examined on 09/29/2022   Brief hospital course: Patient is a 66 year old with past medical history of recent diagnosis of prostate cancer with bone metastases, hypertension, diabetes mellitus and gout who has been noncompliant with medications and presented to the emergency room on 11/2 for confusion and disorientation.  Reportedly, patient had been on the phone on morning of presentation with radiology to set up appointment for bone scan when he appeared to be sounding quite confused on the phone.  Radiology department was able to contact patient's sons who then brought him into the emergency room.  Work-up in the emergency room revealed severe sepsis felt to be secondary to UTI from indwelling Foley catheter.  Patient started on IV fluids and antibiotics and placed in stepdown unit.  By 11/3, patient more alert and appropriate and transferred to medical floor.  Assessment and Plan:  * Severe sepsis secondary to UTI [facility]) Patient met criteria for severe sepsis indwelling Foley catheter present E. coli urinary tract infection. E. coli septicemia. 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  Sepsis stabilizing.  Blood cultures growing out E. coli.   Urine culture also positive for E. coli.  Pending susceptibility.  Repeat blood culture today.  Acute metabolic encephalopathy Mental status has improved.  AKI (acute kidney injury) ([place]) Reviewed previous labs, patient does not have chronic kidney disease.  Renal function still not at baseline, continue normal saline.  Acute gouty arthropathy Had a significant swelling the right knee, I obtain x-ray, no evidence of infection, small amount of joint effusion.  Continue steroids and increased pain  medicine to Percocet 7.5 mg to 4 as needed.  Prostate cancer metastatic to bone [facility]) Oncology will be following.  Anemia Continue to follow, transfuse as needed.  Essential hypertension Blood pressure running high, restart home medicines.  Diabetes mellitus without complication (HCC) Sliding scale coverage  Overweight (BMI 25.0-29.9) Patient meets criteria BMI greater than 25      Subjective:  Patient still complaining of severe pain in the right knee, lower abdomen.  No fever or chills.  Physical Exam: Vitals:   09/29/22 0506 09/29/22 0811 09/29/22 0927 09/29/22 1210  BP: (!) 158/94 (!) 196/145 (!) 178/104 (!) 147/94  Pulse: 95 96 (!) 102 84  Resp: 20 20 (!) 23 18  Temp: 97.7 F (36.5 C) (!) 96.3 F (35.7 C) 97.9 F (36.6 C) (!) 97.5 F (36.4 C)  TempSrc: Oral  Oral Oral  SpO2: 98% 100% 100% 100%  Weight:      Height:       General exam: Appears calm and comfortable  Respiratory system: Clear to auscultation. Respiratory effort normal. Cardiovascular system: S1 & S2 heard, RRR. No JVD, murmurs, rubs, gallops or clicks. No pedal edema. Gastrointestinal system: Abdomen is nondistended, soft and nontender. No organomegaly or masses felt. Normal bowel sounds heard. Central nervous system: Alert and oriented. No focal neurological deficits. Extremities: Right knee swelling. Skin: No rashes, lesions or ulcers Psychiatry:  Mood & affect appropriate.   Data Reviewed:  Reviewed knee x-ray, lab results.  Family Communication: Grandson updated on the phone.  Disposition: Status is: Inpatient Remains inpatient appropriate because: Severity of disease, IV treatment.  Planned Discharge Destination: Home with Home Health    Time spent: 35 minutes  Author: Sharen Hones, MD 09/29/2022 1:08 PM  For on call review [email or web address].

## 2022-09-29 NOTE — Progress Notes (Signed)
   09/29/22 0927  Vitals  Temp 97.9 F (36.6 C)  Temp Source Oral  BP (!) 178/104  MAP (mmHg) 125  BP Location Right Arm  BP Method Automatic  Patient Position (if appropriate) Lying  Pulse Rate (!) 102  Pulse Rate Source Monitor  Resp (!) 23  Level of Consciousness  Level of Consciousness Alert  MEWS COLOR  MEWS Score Color Yellow  Oxygen Therapy  SpO2 100 %  O2 Device Room Air  Patient Activity (if Appropriate) In bed  Pain Assessment  Pain Scale 0-10  Pain Score 10  Pain Type Acute pain  Pain Location Pelvis  Pain Orientation Right;Left  Pain Descriptors / Indicators Other (Comment) (doesn't feel good, just hurts)  Pain Frequency Constant  Pain Onset On-going  Pain Intervention(s) Medication (See eMAR);MD notified (Comment)  Multiple Pain Sites No  POSS Scale ([place] Opioid Sedation Scale)  POSS *See Group Information* S-Acceptable,Sleep, easy to arouse  Complaints & Interventions  Complains of Restless;Anxiety;Other (Comment) (pain)  Interventions Medication (see MAR)  PCA/Epidural/Spinal Assessment  Respiratory Pattern Regular;Labored  Glasgow Coma Scale  Eye Opening 4  Best Verbal Response (NON-intubated) 4  Best Motor Response 6  Glasgow Coma Scale Score 14  MEWS Score  MEWS Temp 0  MEWS Systolic 0  MEWS Pulse 1  MEWS RR 1  MEWS LOC 0  MEWS Score 2  Provider Notification  Provider Name/Title Dr. Sharen Hones  Date Provider Notified 09/29/22  Time Provider Notified 0932  Method of Notification Face-to-face  Notification Reason Change in status  Provider response See new orders  Date of Provider Response 09/29/22  Time of Provider Response [phone]

## 2022-09-30 DIAGNOSIS — G9341 Metabolic encephalopathy: Secondary | ICD-10-CM | POA: Diagnosis not present

## 2022-09-30 DIAGNOSIS — N390 Urinary tract infection, site not specified: Secondary | ICD-10-CM | POA: Diagnosis not present

## 2022-09-30 DIAGNOSIS — A419 Sepsis, unspecified organism: Secondary | ICD-10-CM | POA: Diagnosis not present

## 2022-09-30 DIAGNOSIS — N179 Acute kidney failure, unspecified: Secondary | ICD-10-CM | POA: Diagnosis not present

## 2022-09-30 LAB — CULTURE, BLOOD (ROUTINE X 2)

## 2022-09-30 LAB — IRON AND TIBC
Iron: 69 ug/dL (ref 45–182)
Saturation Ratios: 47 % — ABNORMAL HIGH (ref 17.9–39.5)
TIBC: 147 ug/dL — ABNORMAL LOW (ref 250–450)
UIBC: 78 ug/dL

## 2022-09-30 LAB — CBC
HCT: 30.3 % — ABNORMAL LOW (ref 39.0–52.0)
Hemoglobin: 10 g/dL — ABNORMAL LOW (ref 13.0–17.0)
MCH: 32.3 pg (ref 26.0–34.0)
MCHC: 33 g/dL (ref 30.0–36.0)
MCV: 97.7 fL (ref 80.0–100.0)
Platelets: 100 10*3/uL — ABNORMAL LOW (ref 150–400)
RBC: 3.1 MIL/uL — ABNORMAL LOW (ref 4.22–5.81)
RDW: 13.2 % (ref 11.5–15.5)
WBC: 12 10*3/uL — ABNORMAL HIGH (ref 4.0–10.5)
nRBC: 0 % (ref 0.0–0.2)

## 2022-09-30 LAB — GLUCOSE, CAPILLARY
Glucose-Capillary: 156 mg/dL — ABNORMAL HIGH (ref 70–99)
Glucose-Capillary: 168 mg/dL — ABNORMAL HIGH (ref 70–99)
Glucose-Capillary: 182 mg/dL — ABNORMAL HIGH (ref 70–99)
Glucose-Capillary: 211 mg/dL — ABNORMAL HIGH (ref 70–99)
Glucose-Capillary: 217 mg/dL — ABNORMAL HIGH (ref 70–99)
Glucose-Capillary: 249 mg/dL — ABNORMAL HIGH (ref 70–99)

## 2022-09-30 LAB — FERRITIN: Ferritin: 3489 ng/mL — ABNORMAL HIGH (ref 24–336)

## 2022-09-30 LAB — MAGNESIUM: Magnesium: 2.1 mg/dL (ref 1.7–2.4)

## 2022-09-30 LAB — BASIC METABOLIC PANEL
Anion gap: 14 (ref 5–15)
BUN: 72 mg/dL — ABNORMAL HIGH (ref 8–23)
CO2: 17 mmol/L — ABNORMAL LOW (ref 22–32)
Calcium: 8.4 mg/dL — ABNORMAL LOW (ref 8.9–10.3)
Chloride: 106 mmol/L (ref 98–111)
Creatinine, Ser: 2.55 mg/dL — ABNORMAL HIGH (ref 0.61–1.24)
GFR, Estimated: 27 mL/min — ABNORMAL LOW (ref >60)
Glucose, Bld: 200 mg/dL — ABNORMAL HIGH (ref 70–99)
Potassium: 4.2 mmol/L (ref 3.5–5.1)
Sodium: 137 mmol/L (ref 135–145)

## 2022-09-30 LAB — URINE CULTURE: Culture: >=100000 — AB

## 2022-09-30 MED ORDER — QUETIAPINE FUMARATE 25 MG PO TABS: 25.0000 mg | ORAL_TABLET | Freq: Every day | ORAL | Status: DC

## 2022-09-30 MED ORDER — SODIUM CHLORIDE 0.45 % IV SOLN
INTRAVENOUS | Status: DC
  Filled 2022-09-30 (×3): qty 75

## 2022-09-30 MED ORDER — HALOPERIDOL 2 MG PO TABS: 2.0000 mg | ORAL_TABLET | Freq: Four times a day (QID) | ORAL | Status: DC | PRN

## 2022-09-30 MED ORDER — ADULT MULTIVITAMIN W/MINERALS CH
1.0000 | ORAL_TABLET | Freq: Every day | ORAL | Status: DC
  Administered 2022-10-01 – 2022-10-06 (×6): 1 via ORAL
  Filled 2022-09-30 (×6): qty 1

## 2022-09-30 MED ORDER — FOLIC ACID 1 MG PO TABS
1.0000 mg | ORAL_TABLET | Freq: Every day | ORAL | Status: DC
  Administered 2022-10-01 – 2022-10-06 (×6): 1 mg via ORAL
  Filled 2022-09-30 (×6): qty 1

## 2022-09-30 MED ORDER — LORAZEPAM 1 MG PO TABS
1.0000 mg | ORAL_TABLET | ORAL | Status: AC | PRN
  Administered 2022-10-03: 1 mg via ORAL
  Filled 2022-09-30: qty 1

## 2022-09-30 MED ORDER — HALOPERIDOL LACTATE 5 MG/ML IJ SOLN
2.0000 mg | Freq: Four times a day (QID) | INTRAMUSCULAR | Status: DC | PRN
  Administered 2022-09-30: 2 mg via INTRAMUSCULAR
  Filled 2022-09-30: qty 1

## 2022-09-30 MED ORDER — AMOXICILLIN 500 MG PO CAPS
500.0000 mg | ORAL_CAPSULE | Freq: Three times a day (TID) | ORAL | Status: DC
  Administered 2022-09-30 – 2022-10-01 (×2): 500 mg via ORAL
  Filled 2022-09-30 (×4): qty 1

## 2022-09-30 MED ORDER — LORAZEPAM 2 MG/ML IJ SOLN
1.0000 mg | INTRAMUSCULAR | Status: AC | PRN
  Administered 2022-09-30: 1 mg via INTRAVENOUS
  Administered 2022-09-30 – 2022-10-01 (×4): 2 mg via INTRAVENOUS
  Administered 2022-10-01: 3 mg via INTRAVENOUS
  Administered 2022-10-01: 2 mg via INTRAVENOUS
  Filled 2022-09-30 (×3): qty 1
  Filled 2022-09-30: qty 2
  Filled 2022-09-30 (×4): qty 1

## 2022-09-30 MED ORDER — PREDNISONE 20 MG PO TABS
40.0000 mg | ORAL_TABLET | Freq: Every day | ORAL | Status: DC
  Administered 2022-10-01 – 2022-10-02 (×2): 40 mg via ORAL
  Filled 2022-09-30 (×2): qty 2

## 2022-09-30 MED ORDER — QUETIAPINE FUMARATE 25 MG PO TABS
25.0000 mg | ORAL_TABLET | Freq: Every day | ORAL | Status: DC
  Administered 2022-09-30 – 2022-10-01 (×2): 25 mg via ORAL
  Filled 2022-09-30 (×2): qty 1

## 2022-09-30 MED ORDER — THIAMINE MONONITRATE 100 MG PO TABS
100.0000 mg | ORAL_TABLET | Freq: Every day | ORAL | Status: DC
  Administered 2022-10-01 – 2022-10-06 (×6): 100 mg via ORAL
  Filled 2022-09-30 (×6): qty 1

## 2022-09-30 MED ORDER — THIAMINE HCL 100 MG/ML IJ SOLN
100.0000 mg | Freq: Every day | INTRAMUSCULAR | Status: DC
  Filled 2022-09-30 (×2): qty 2

## 2022-09-30 NOTE — Progress Notes (Signed)
OT Cancellation Note  Patient Details Name: PHENIX VANDERMEULEN MRN: [id number] DOB: [birth year]   Cancelled Treatment:    Reason Eval/Treat Not Completed: Other (comment) (per RN, pt not approrpiate to see on this date, agitated, unable to appropriately participate in therapy, recently received halodol. OT will reattempt as appropriate.)  Shanon Payor, OTD OTR/L  09/30/22, 2:41 PM

## 2022-09-30 NOTE — Progress Notes (Addendum)
Progress Note   Patient: Roger [name] [id number]:[id number] DOB: Mar 29, 1956 DOA: 09/27/2022     3 DOS: the patient was seen and examined on 09/30/2022   Brief hospital course: Patient is a 66 year old with past medical history of recent diagnosis of prostate cancer with bone metastases, hypertension, diabetes mellitus and gout who has been noncompliant with medications and presented to the emergency room on 11/2 for confusion and disorientation.  Reportedly, patient had been on the phone on morning of presentation with radiology to set up appointment for bone scan when he appeared to be sounding quite confused on the phone.  Radiology department was able to contact patient's sons who then brought him into the emergency room.  Work-up in the emergency room revealed severe sepsis felt to be secondary to UTI from indwelling Foley catheter.  Patient started on IV fluids and antibiotics and placed in stepdown unit.  By 11/3, patient more alert and appropriate and transferred to medical floor.  Patient had a urinary retention secondary to blocked urinary catheter, flushed, resolved.  Patient also developed significant agitation on 11/4.  Seroquel started.  Assessment and Plan: Severe sepsis secondary to UTI [facility]) Patient met criteria for severe sepsis indwelling Foley catheter present E. coli urinary tract infection. E. coli septicemia. 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  Sepsis stabilizing.  Blood cultures growing out E. coli.   Urine culture also positive for E. Coli, Klebsiella oxytoca, Enterococcus faecalis.  Ceftriaxone appears to be his appropriate antibiotics.  We will continue Rocephin, also added amoxicillin to cover Enterococcus.   Acute metabolic encephalopathy Delirium tremens Patient had a significant agitation last night, could not sleep.  Sitter provided.    Discussed with the patient and son, apparently patient is a very heavy alcohol drinker.  His condition is consistent with delirium tremens from alcohol withdrawal.  We will start CIWA protocol, will monitor closely.  May consider transfer to stepdown unit if condition deteriorates.   AKI (acute kidney injury) ([place]) Urinary retention.  Metabolic acidosis. Patient had a bladder residual of 400 mL despite Foley cath in place, flushed Foley catheter, repeat residual less than 90 mL.  Renal function slightly worse today with worsening metabolic acidosis.  This is due to urinary retention happened yesterday.  We will continue bicarb drip.  Acute gouty arthropathy Had a significant swelling the right knee, I obtain x-ray, no evidence of infection, small amount of joint effusion.   Right knee swelling much better today, reduce steroid dose.   Prostate cancer metastatic to bone [facility]) Oncology will be following.   Anemia Hemoglobin 8.2, B12 level within normal limits.  Check iron level.   Essential hypertension Blood pressure running high, restart home medicines.   Diabetes mellitus without complication (HCC) Sliding scale coverage   Overweight (BMI 25.0-29.9) Patient meets criteria BMI greater than 25      Subjective:  Patient was very agitated last night, was combative.  He did not sleep well last night.  Feels better this morning. Had a urinary retention yesterday, Foley catheter flushed.  Physical Exam: Vitals:   09/30/22 0306 09/30/22 0718 09/30/22 0920 09/30/22 1019  BP: (!) 156/89 (!) 169/115 (!) 169/90 (!) 152/94  Pulse: 89 (!) 103 93 85  Resp: 20 (!) _0 Temp: 97.8 F (36.6 C) (!) 97.5 F (36.4 C) 97.7 F (36.5 C) (!) 97.4 F (36.3 C)  TempSrc:  Oral     SpO2: 98% 100% 100% 97%  Weight:      Height:       General exam: Confused, became combative. Respiratory system: Clear to auscultation. Respiratory effort normal. Cardiovascular system: S1 & S2 heard, RRR. No  JVD, murmurs, rubs, gallops or clicks. No pedal edema. Gastrointestinal system: Abdomen is nondistended, soft and nontender. No organomegaly or masses felt. Normal bowel sounds heard. Central nervous system: Alert and oriented x2. No focal neurological deficits. Extremities: Symmetric 5 x 5 power. Skin: No rashes, lesions or ulcers Psychiatry: Confused Data Reviewed:  Lab results reviewed.  Family Communication: None  Disposition: Status is: Inpatient Remains inpatient appropriate because: Severity of disease, IV treatment.  Planned Discharge Destination: Home with Home Health    Time spent: 55 minutes  Author: Sharen Hones, MD 09/30/2022 12:43 PM  For on call review [email or web address].

## 2022-09-30 NOTE — Progress Notes (Signed)
Patient currently does not have IV access and is refusing PO medication will give IV medication once access is obtained

## 2022-09-30 NOTE — Progress Notes (Signed)
PT Cancellation Note  Patient Details Name: Roger [name] MRN: [id number] DOB: [birth year]   Cancelled Treatment:    Reason Eval/Treat Not Completed: Other (comment). Evaluation attempted, however pt very confused and agitated when attempts made for evaluation. Pt reports he's at [facility] and responds to sounds outside in hallway. Unable to safely perform evaluation this date. Will re-attempt tomorrow.   Alonie Gazzola 09/30/2022, 12:11 PM Greggory Stallion, PT, DPT, GCS [phone]

## 2022-09-30 NOTE — TOC CM/SW Note (Signed)
CSW attempted to complete high risk assessment at bedside. Pt was disoriented and unable to participate in assessment.   CSW spoke with pt's friend, Merced Hanners, via phone. Ms. Goncalves strongly encouraged CSW to speak with pt's family to complete assessment. She provided contact # for pt's sister, Letta Median [phone]  Per chart review, Letta Median nor pt's sons are listed on [place]. TOC will follow for needs  Christa See, LCSW Transitions of Care [phone]

## 2022-10-01 ENCOUNTER — Inpatient Hospital Stay: Payer: 59 | Admitting: Hospice and Palliative Medicine

## 2022-10-01 DIAGNOSIS — F10931 Alcohol use, unspecified with withdrawal delirium: Secondary | ICD-10-CM | POA: Diagnosis not present

## 2022-10-01 DIAGNOSIS — F1010 Alcohol abuse, uncomplicated: Secondary | ICD-10-CM | POA: Insufficient documentation

## 2022-10-01 DIAGNOSIS — N390 Urinary tract infection, site not specified: Secondary | ICD-10-CM | POA: Diagnosis not present

## 2022-10-01 DIAGNOSIS — C61 Malignant neoplasm of prostate: Secondary | ICD-10-CM | POA: Diagnosis not present

## 2022-10-01 DIAGNOSIS — A419 Sepsis, unspecified organism: Secondary | ICD-10-CM | POA: Diagnosis not present

## 2022-10-01 LAB — CBC
HCT: 22.2 % — ABNORMAL LOW (ref 39.0–52.0)
Hemoglobin: 7.4 g/dL — ABNORMAL LOW (ref 13.0–17.0)
MCH: 32.5 pg (ref 26.0–34.0)
MCHC: 33.3 g/dL (ref 30.0–36.0)
MCV: 97.4 fL (ref 80.0–100.0)
Platelets: 67 10*3/uL — ABNORMAL LOW (ref 150–400)
RBC: 2.28 MIL/uL — ABNORMAL LOW (ref 4.22–5.81)
RDW: 13.7 % (ref 11.5–15.5)
WBC: 4.9 10*3/uL (ref 4.0–10.5)
nRBC: 0 % (ref 0.0–0.2)

## 2022-10-01 LAB — GLUCOSE, CAPILLARY
Glucose-Capillary: 126 mg/dL — ABNORMAL HIGH (ref 70–99)
Glucose-Capillary: 145 mg/dL — ABNORMAL HIGH (ref 70–99)
Glucose-Capillary: 156 mg/dL — ABNORMAL HIGH (ref 70–99)
Glucose-Capillary: 173 mg/dL — ABNORMAL HIGH (ref 70–99)
Glucose-Capillary: 180 mg/dL — ABNORMAL HIGH (ref 70–99)
Glucose-Capillary: 197 mg/dL — ABNORMAL HIGH (ref 70–99)

## 2022-10-01 LAB — BASIC METABOLIC PANEL
Anion gap: 7 (ref 5–15)
BUN: 79 mg/dL — ABNORMAL HIGH (ref 8–23)
CO2: 22 mmol/L (ref 22–32)
Calcium: 8.1 mg/dL — ABNORMAL LOW (ref 8.9–10.3)
Chloride: 108 mmol/L (ref 98–111)
Creatinine, Ser: 2.25 mg/dL — ABNORMAL HIGH (ref 0.61–1.24)
GFR, Estimated: 32 mL/min — ABNORMAL LOW (ref >60)
Glucose, Bld: 157 mg/dL — ABNORMAL HIGH (ref 70–99)
Potassium: 4 mmol/L (ref 3.5–5.1)
Sodium: 137 mmol/L (ref 135–145)

## 2022-10-01 LAB — HEMOGLOBIN
Hemoglobin: 8.7 g/dL — ABNORMAL LOW (ref 13.0–17.0)
Hemoglobin: 8.9 g/dL — ABNORMAL LOW (ref 13.0–17.0)

## 2022-10-01 LAB — MAGNESIUM: Magnesium: 2.1 mg/dL (ref 1.7–2.4)

## 2022-10-01 MED ORDER — SODIUM CHLORIDE 0.9 % IV SOLN
3.0000 g | Freq: Four times a day (QID) | INTRAVENOUS | Status: DC
  Administered 2022-10-01 – 2022-10-06 (×19): 3 g via INTRAVENOUS
  Filled 2022-10-01: qty 3
  Filled 2022-10-01: qty 8
  Filled 2022-10-01: qty 3
  Filled 2022-10-01: qty 8
  Filled 2022-10-01: qty 3
  Filled 2022-10-01: qty 8
  Filled 2022-10-01: qty 3
  Filled 2022-10-01 (×9): qty 8
  Filled 2022-10-01: qty 3
  Filled 2022-10-01: qty 8
  Filled 2022-10-01: qty 3
  Filled 2022-10-01 (×2): qty 8
  Filled 2022-10-01: qty 3

## 2022-10-01 MED ORDER — LACTATED RINGERS IV SOLN: INTRAVENOUS | Status: DC

## 2022-10-01 MED ORDER — PANTOPRAZOLE SODIUM 40 MG IV SOLR
40.0000 mg | Freq: Two times a day (BID) | INTRAVENOUS | Status: DC
  Administered 2022-10-01 – 2022-10-02 (×3): 40 mg via INTRAVENOUS
  Filled 2022-10-01 (×3): qty 10

## 2022-10-01 NOTE — Progress Notes (Signed)
Progress Note   Patient: Roger [name] [id number]:[id number] DOB: 09-14-1956 DOA: 09/27/2022     4 DOS: the patient was seen and examined on 10/01/2022   Brief hospital course: Patient is a 66 year old with past medical history of recent diagnosis of prostate cancer with bone metastases, hypertension, diabetes mellitus and gout who has been noncompliant with medications and presented to the emergency room on 11/2 for confusion and disorientation.  Reportedly, patient had been on the phone on morning of presentation with radiology to set up appointment for bone scan when he appeared to be sounding quite confused on the phone.  Radiology department was able to contact patient's sons who then brought him into the emergency room.  Work-up in the emergency room revealed severe sepsis felt to be secondary to UTI from indwelling Foley catheter.  Patient started on IV fluids and antibiotics and placed in stepdown unit.  By 11/3, patient more alert and appropriate and transferred to medical floor.  Patient had a urinary retention secondary to blocked urinary catheter, flushed, resolved.  Patient also developed significant agitation on 11/4.  Started CIWA protocol.  Assessment and Plan: Severe sepsis secondary to UTI [facility]) Patient met criteria for severe sepsis indwelling Foley catheter present E. coli urinary tract infection. E. coli septicemia. 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  Sepsis stabilizing.  Blood cultures growing out E. coli.   Urine culture also positive for E. Coli, Klebsiella oxytoca, Enterococcus faecalis.   Antibiotic to Unasyn to cover all bacteria in the urine. Patient no longer has any urinary retention with a Foley catheter.   Acute metabolic encephalopathy Delirium tremens Patient had a significant agitation at night, could not sleep.   Discussed with  the patient son, apparently patient is a very heavy alcohol drinker.  His condition is consistent with delirium tremens from alcohol withdrawal.   CIWA protocol started.  We will continue monitor closely, if condition does not improve, we may consider an MRI of the brain to rule out metastasis from prostate cancer.   AKI (acute kidney injury) ([place]) Urinary retention.  Metabolic acidosis. Patient had a bladder residual of 400 mL despite Foley cath in place, flushed Foley catheter, repeat residual less than 90 mL.   Patient is placed on bicarb drip, renal function is getting better.  Metabolic acidosis has improved. Change fluids to lactated [place].    Acute gouty arthropathy Had a significant swelling the right knee, I obtain x-ray, no evidence of infection, small amount of joint effusion.   Right knee swelling much better, reduced steroid dose.  Will tapered off.   Prostate cancer metastatic to bone [facility]) Oncology will be following.   Anemia Patient has a drop of hemoglobin to 7.4, in the past back to 8.3.  GI consult obtained to make sure patient does not have GI bleed.  Liver ultrasound to rule out liver cirrhosis.  Protonix IV for now.  Monitor hemoglobin every 8 hours.   Essential hypertension Blood pressure running high, restart home medicines.   Uncontrolled type 2 diabetes with hyperglycemia. Sliding scale coverage   Overweight (BMI 25.0-29.9) Patient meets criteria BMI greater than 25        Subjective:  Patient is confused this morning, sedated. Denies any short of breath or cough.  No pain.  Physical Exam: Vitals:   10/01/22 0345 10/01/22 0745 10/01/22 0850 10/01/22 0928  BP: (!) 150/93 [place] Kitchen)  144/81  (!) 159/95  Pulse: 71 66 72 82  Resp: _0 Temp: 97.6 F (36.4 C) 97.6 F (36.4 C)    TempSrc:      SpO2: 99% 99%  100%  Weight:      Height:       General exam: Appears calm and comfortable  Respiratory system: Clear to auscultation. Respiratory effort  normal. Cardiovascular system: S1 & S2 heard, RRR. No JVD, murmurs, rubs, gallops or clicks. No pedal edema. Gastrointestinal system: Abdomen is nondistended, soft and nontender. No organomegaly or masses felt. Normal bowel sounds heard. Central nervous system: Drawsy and oriented x1. No focal neurological deficits. Extremities: Symmetric 5 x 5 power. Skin: No rashes, lesions or ulcers   Data Reviewed:  Lab results reviewed.  Family Communication: None  Disposition: Status is: Inpatient Remains inpatient appropriate because: Severity of disease, altered mental status, IV treatment.  Planned Discharge Destination: Home with Home Health    Time spent: 50 minutes  Author: Sharen Hones, MD 10/01/2022 12:31 PM  For on call review [email or web address].

## 2022-10-01 NOTE — Evaluation (Signed)
Occupational Therapy Evaluation Patient Details Name: Roger [name] MRN: [id number] DOB: January 19, 1956 Today's Date: 10/01/2022   History of Present Illness Pt is a 66 year old male presenting to the ED on 11/2 with  confusion and disorientation, admitted with severe sepsis secondary to UTI, acute metabolic encephalopathy, AKI, acute gouty arthorpathy; PMH significant for prostate cancer with bone metastases, hypertension, diabetes mellitus and gout   Clinical Impression   Chart reviewed, RN cleared pt for participation in OT evaluation. Pt is lethargic, oriented to self only, waxing/waning awareness of location/situation. Pt oriented to date. Pt follows one step directions inconsistently. Frequent multi modal during required for sustained attention to task Improved engagement in task with position changes, however is altered throughout. Pt is an inconsistent historian, endorses he is indep in ADL/IADL PTA and lives alone.PT requires MAX A for bed mobility, TOTAL A+2 for lateral scoot to bedside chair, MAX A for grooming tasks seated on edge of bed. Poor seated balance noted requiring CGA-MIN A frequently. Attempted STS multiple attempts with/without RW, pt requires MAX-TOTAL A +2, unable to achieve full upright standing. Pt presents with deficits in strength, endurance, activity tolerance, balance, cognition affecting safe and optimal ADL completion. Recommend discharge to STR to address functional deficits.   Of note: pt noted with delayed swallow when attempting breakfast, co treating PT notified MD via secure chat.      Recommendations for follow up therapy are one component of a multi-disciplinary discharge planning process, led by the attending physician.  Recommendations may be updated based on patient status, additional functional criteria and insurance authorization.   Follow Up Recommendations  Skilled nursing-short term rehab (<3 hours/day)    Assistance Recommended at Discharge Frequent  or constant Supervision/Assistance  Patient can return home with the following Two people to help with walking and/or transfers;Two people to help with bathing/dressing/bathroom    Functional Status Assessment  Patient has had a recent decline in their functional status and demonstrates the ability to make significant improvements in function in a reasonable and predictable amount of time.  Equipment Recommendations  Other (comment) (per next venue of care)    Recommendations for Other Services       Precautions / Restrictions Precautions Precautions: Fall Restrictions Weight Bearing Restrictions: No      Mobility Bed Mobility Overal bed mobility: Needs Assistance Bed Mobility: Supine to Sit     Supine to sit: Max assist, HOB elevated          Transfers Overall transfer level: Needs assistance Equipment used: Rolling walker (2 wheels), None Transfers: Sit to/from Stand, Bed to chair/wheelchair/BSC Sit to Stand: Max assist, Total assist, +2 physical assistance (attempted with RW and without, MAX-TOTAL A +2 required with pt able to lift bottom off bed/chair approx 6 inches)          Lateral/Scoot Transfers: Total assist, +2 physical assistance, +2 safety/equipment (will require hoyer lift transfer back to bed, NT notified)        Balance Overall balance assessment: Needs assistance Sitting-balance support: Feet supported Sitting balance-Leahy Scale: Poor Sitting balance - Comments: intermittent CGA-MIN A required due to posterior lean Postural control: Posterior lean   Standing balance-Leahy Scale: Zero                             ADL either performed or assessed with clinical judgement   ADL Overall ADL's : Needs assistance/impaired Eating/Feeding: Sitting Eating/Feeding Details (indicate cue type and reason):  pt unable to feed himself, delayed swallow noted with vcs required- team notified Grooming: Wash/dry face;Maximal assistance            Upper Body Dressing : Total assistance   Lower Body Dressing: Total assistance   Toilet Transfer: Maximal assistance;Total assistance;+2 for physical assistance;+2 for safety/equipment   Toileting- Clothing Manipulation and Hygiene: Total assistance               Vision   Additional Comments: unable to report, OT will continue to assess     Perception     Praxis      Pertinent Vitals/Pain Pain Assessment Pain Assessment: Faces Faces Pain Scale: Hurts whole lot Pain Location: B knees, L ankle with bed mobility Pain Descriptors / Indicators: Discomfort, Grimacing, Guarding Pain Intervention(s): Limited activity within patient's tolerance, Monitored during session, Repositioned     Hand Dominance     Extremity/Trunk Assessment Upper Extremity Assessment Upper Extremity Assessment: Generalized weakness;Difficult to assess due to impaired cognition   Lower Extremity Assessment Lower Extremity Assessment: Generalized weakness;Difficult to assess due to impaired cognition       Communication Communication Communication: Expressive difficulties   Cognition Arousal/Alertness: Lethargic, Suspect due to medications Behavior During Therapy: Flat affect Overall Cognitive Status: No family/caregiver present to determine baseline cognitive functioning Area of Impairment: Orientation, Attention, Memory, Following commands, Safety/judgement, Awareness, Problem solving                 Orientation Level: Disoriented to, Place, Time, Situation     Following Commands: Follows one step commands inconsistently Safety/Judgement: Decreased awareness of safety, Decreased awareness of deficits Awareness: Intellectual Problem Solving: Slow processing, Decreased initiation, Difficulty sequencing, Requires verbal cues, Requires tactile cues General Comments: pt requiring frequent vcs for attention to simple tasks     General Comments       Exercises     Shoulder  Instructions      Home Living Family/patient expects to be discharged to:: Private residence                                 Additional Comments: pt is a poor report, attempted to contact sister Letta Median however unable to reach, left VM; pt reports he lives alone in a house and is independent in ADL/IADL, endorses he walks with a RW, 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, 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      Prior Functioning/Environment                          OT Problem List: Decreased strength;Decreased activity tolerance;Impaired balance (sitting and/or standing);Decreased knowledge of use of DME or AE;Decreased safety awareness;Decreased cognition;Impaired UE functional use      OT Treatment/Interventions: Self-care/ADL training;DME and/or AE instruction;Therapeutic activities;Therapeutic exercise;Energy conservation;Patient/family education;Cognitive remediation/compensation;Balance training    OT Goals(Current goals can be found in the care plan section) Acute Rehab OT Goals OT Goal Formulation: Patient unable to participate in goal setting ADL Goals Pt Will Perform Grooming: with min assist;sitting Pt Will Perform Lower Body Dressing: with mod assist Pt Will Transfer to Toilet: with min assist Pt Will Perform Toileting - Clothing Manipulation and hygiene: with mod assist  OT Frequency: Min 2X/week    Co-evaluation PT/OT/SLP Co-Evaluation/Treatment: Yes Reason for Co-Treatment: Complexity of the patient's impairments (multi-system involvement);Necessary to address cognition/behavior during functional activity;For patient/therapist safety;To address functional/ADL transfers   OT goals addressed  during session: ADL's and self-care      AM-PAC OT "6 Clicks" Daily Activity     Outcome Measure Help from another person eating meals?: A Lot Help from another person taking care of personal grooming?: A Lot Help from another person  toileting, which includes using toliet, bedpan, or urinal?: Total Help from another person bathing (including washing, rinsing, drying)?: Total Help from another person to put on and taking off regular upper body clothing?: A Lot Help from another person to put on and taking off regular lower body clothing?: Total 6 Click Score: 9   End of Session Equipment Utilized During Treatment: Rolling walker (2 wheels) Nurse Communication: Mobility status;Need for lift equipment (NT need for lift equipment)  Activity Tolerance: Patient limited by lethargy Patient left: in chair;with call bell/phone within reach;with chair alarm set  OT Visit Diagnosis: Muscle weakness (generalized) (M62.81);Cognitive communication deficit (R41.841);Other abnormalities of gait and mobility (R26.89)                Time: 7494-4967 OT Time Calculation (min): 51 min Charges:  OT General Charges $OT Visit: 1 Visit OT Evaluation $OT Eval High Complexity: 1 High OT Treatments $Therapeutic Activity: 8-22 mins  Shanon Payor, OTD OTR/L  10/01/22, 11:17 AM

## 2022-10-01 NOTE — Consult Note (Addendum)
Consultation  Referring Provider:     Dr Roosevelt Locks Admit date 09/27/22 Consult date       10/01/22  Reason for Consultation:              HPI:   Roger [name] is a 66 y.o. male  medical history significant for Diabetes, hypertension, gout, recently diagnosed with prostate cancer metastatic to bone,  urinary retention with Foley catheter placemen who was sent to the ED for evaluation of confusion and disorientation noted when patient was attempting to schedule bone scan due to metastatic prostate cancer. He was found to have urosepsis with AKI and acute encephalopathy, being treated with fluids,and antibiotics. He is also a heavy drinker at home and has been going through DTs- is on CIWA. There has been documentation of medical nonadherence. GI has been consulted due to worsening normocytic anemia. Primary team has also ordered a liver US to assess for cirrhosis and started on Pantoprazole.  Ferritin 3489. Iron normal. Iron saturation elevated. TIBC low. Platelets low over the last 3w. Transaminases normal. Bilirubin normal. Alp 242. Pt 18.8, inr 1.6. B12 normal, folate low Blood cultures have grown e coli and enterobacter Had negative HBV and HCV testing 08/08/22. He is immune to HAV.  He follows with Ms Beverley Fiedler, [facility] of [place] for elevated liver enzymes/fatty liver and alcoholic component- reported 12 shots/d or more for the last 5y- he has not been formally diagnosed with cirrhosis to date CT a/p 10/23- IMPRESSION: 1. Mild bilateral hydronephrosis and distended urinary bladder, consistent with bladder outlet obstruction and hydronephrosis secondary to back pressure. No urinary tract calculi. 2. Prostatomegaly with indistinct margins, particularly at the prostatic base. 3. Numerous enlarged bilateral pelvic sidewall, iliac, and retroperitoneal lymph nodes. 4. 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 5. Constellation of  findings is consistent prostate malignancy and associated metastatic disease. 6. Hepatic steatosis. 7. Cholelithiasis without evidence of acute cholecystitis. 8. Pancolonic diverticulosis without evidence of acute diverticulitis. There is a friend at bedside. Patient is currently oriented to name but not situation/place/date. There have been no reported GI related concerns-including sings of gib. He is hemodynamically stable. He has also been seen by oncology and a bone marrow biopsy has been recommended for this admission- his anemia was attributed to bone marrow suppression due to severe sepsis and folic acid supplementation has been recommended.  PREVIOUS ENDOSCOPIES:            Colonoscopy 2019- Dr Marius Ditch- one large tubovillous adenoma, 2 small polyps- one adenoma, one hyperplastic. There was diverticulosis   Past Medical History:  Diagnosis Date   Chest pain    a. 04/2016 MV: small, mild apical defect c/w apical ischemia, EF 55-65%-->Low risk study.   Diabetes mellitus without complication ([place])    Gout    Hematochezia    History of echocardiogram    a. 04/2016 Echo: EF 55-60%, mild LVH, no rwma, midly dil LA.   History of heart attack    Hypertension    Morbid obesity ([place])     Past Surgical History:  Procedure Laterality Date   COLONOSCOPY WITH PROPOFOL N/A 08/05/2018   Procedure: COLONOSCOPY WITH PROPOFOL;  Surgeon: Lin Landsman, MD;  Location: [facility] ENDOSCOPY;  Service: Gastroenterology;  Laterality: N/A;   thumb surgery      Family History  Problem Relation Age of Onset   Heart Problems Mother    Breast cancer Mother    Mental illness Father    Heart  disease Father    Stroke Father    Diabetes Father    Alcohol abuse Father    Bipolar disorder Father    Heart disease Other    Hypertension Other    Diabetes Other      Social History   Tobacco Use   Smoking status: Former    Packs/day: 0.25    Years: 25.00    Total pack years: 6.25    Types:  Cigarettes    Quit date: 1994    Years since quitting: 29.8   Smokeless tobacco: Never  Vaping Use   Vaping Use: Never used  Substance Use Topics   Alcohol use: Yes    Alcohol/week: 42.0 standard drinks of alcohol    Types: 10 Cans of beer, 32 Shots of liquor per week    Comment: everyday. 3-5 servings whiskey nightly & beer   Drug use: No    Prior to Admission medications   Medication Sig Start Date End Date Taking? Authorizing Provider  aspirin EC 81 MG tablet Take 1 tablet (81 mg total) by mouth daily. 12/17/21  Yes Fisher, Linden Dolin, PA-C  folic acid (FOLVITE) 1 MG tablet Take 1 tablet (1 mg total) by mouth daily. 09/25/22  Yes Earlie Server, MD  allopurinol (ZYLOPRIM) 100 MG tablet Take 2 tablets (200 mg total) by mouth 2 (two) times daily. 12/17/21 09/24/22  Fisher, Linden Dolin, PA-C  colchicine 0.6 MG tablet Take 1 tablet (0.6 mg total) by mouth 2 (two) times daily. Patient not taking: Reported on 09/24/2022 12/17/21 09/20/22  Versie Starks, PA-C  isosorbide mononitrate (IMDUR) 30 MG 24 hr tablet TAKE 1 TABLET(30 MG) BY MOUTH DAILY Patient not taking: Reported on 09/27/2022 12/17/21   Versie Starks, PA-C  metFORMIN (GLUCOPHAGE) 500 MG tablet Take 1 tablet (500 mg total) by mouth 2 (two) times daily with a meal. 12/17/21   Fisher, Linden Dolin, PA-C  metoprolol tartrate (LOPRESSOR) 25 MG tablet Take 1 tablet (25 mg total) by mouth 2 (two) times daily. Patient not taking: Reported on 09/27/2022 12/17/21   Versie Starks, PA-C  rosuvastatin (CRESTOR) 10 MG tablet Take 1 tablet (10 mg total) by mouth daily. Patient not taking: Reported on 09/27/2022 12/17/21   Versie Starks, PA-C  traMADol (ULTRAM) 50 MG tablet Take 1 tablet (50 mg total) by mouth every 6 (six) hours as needed. Patient not taking: Reported on 09/27/2022 12/17/21   Versie Starks, PA-C  triamterene-hydrochlorothiazide (MAXZIDE-25) 37.5-25 MG tablet Take 1 tablet by mouth daily. Patient not taking: Reported on 09/27/2022 07/03/20   Coral Spikes, DO  lisinopril (ZESTRIL) 20 MG tablet TAKE 1 TABLET(20 MG) BY MOUTH DAILY 05/13/19 06/25/19  Leone Haven, MD    Current Facility-Administered Medications  Medication Dose Route Frequency Provider Last Rate Last Admin   acetaminophen (TYLENOL) tablet 650 mg  650 mg Oral Q6H PRN Annita Brod, MD   650 mg at 09/28/22 2028   Or   acetaminophen (TYLENOL) suppository 650 mg  650 mg Rectal Q6H PRN Annita Brod, MD       allopurinol (ZYLOPRIM) tablet 200 mg  200 mg Oral Daily Sharen Hones, MD   200 mg at 10/01/22 0847   Ampicillin-Sulbactam (UNASYN) 3 g in sodium chloride 0.9 % 100 mL IVPB  3 g Intravenous Q6H Sharen Hones, MD       aspirin EC tablet 81 mg  81 mg Oral Daily Sharen Hones, MD   81 mg at 10/01/22  9735   Chlorhexidine Gluconate Cloth 2 % PADS 6 each  6 each Topical Daily Annita Brod, MD   6 each at 10/01/22 1205   enoxaparin (LOVENOX) injection 40 mg  40 mg Subcutaneous Q24H Annita Brod, MD   40 mg at 32/99/24 2683   folic acid (FOLVITE) tablet 1 mg  1 mg Oral Daily Sharen Hones, MD   1 mg at 10/01/22 0847   insulin aspart (novoLOG) injection 0-15 Units  0-15 Units Subcutaneous Q4H Annita Brod, MD   3 Units at 10/01/22 1205   isosorbide mononitrate (IMDUR) 24 hr tablet 30 mg  30 mg Oral Daily Sharen Hones, MD   30 mg at 10/01/22 4196   lactated ringers infusion   Intravenous Continuous Sharen Hones, MD 100 mL/hr at 10/01/22 1329 New Bag at 10/01/22 1329   LORazepam (ATIVAN) tablet 1-4 mg  1-4 mg Oral Q1H PRN Sharen Hones, MD       Or   LORazepam (ATIVAN) injection 1-4 mg  1-4 mg Intravenous Q1H PRN Sharen Hones, MD   2 mg at 10/01/22 0439   metoprolol tartrate (LOPRESSOR) tablet 25 mg  25 mg Oral BID Sharen Hones, MD   25 mg at 10/01/22 0850   multivitamin with minerals tablet 1 tablet  1 tablet Oral Daily Sharen Hones, MD   1 tablet at 10/01/22 0846   ondansetron (ZOFRAN) tablet 4 mg  4 mg Oral Q6H PRN Annita Brod, MD       Or    ondansetron [facility]) injection 4 mg  4 mg Intravenous Q6H PRN Annita Brod, MD       oxyCODONE-acetaminophen (PERCOCET) 7.5-325 MG per tablet 1 tablet  1 tablet Oral Q4H PRN Sharen Hones, MD   1 tablet at 10/01/22 0847   pantoprazole (PROTONIX) injection 40 mg  40 mg Intravenous Q12H Sharen Hones, MD   40 mg at 10/01/22 0845   predniSONE (DELTASONE) tablet 40 mg  40 mg Oral Q breakfast Sharen Hones, MD   40 mg at 10/01/22 0846   QUEtiapine (SEROQUEL) tablet 25 mg  25 mg Oral QHS Sharen Hones, MD   25 mg at 09/30/22 2048   rosuvastatin (CRESTOR) tablet 10 mg  10 mg Oral Daily Sharen Hones, MD   10 mg at 10/01/22 0847   thiamine (VITAMIN B1) tablet 100 mg  100 mg Oral Daily Sharen Hones, MD   100 mg at 10/01/22 2229   Or   thiamine (VITAMIN B1) injection 100 mg  100 mg Intravenous Daily Sharen Hones, MD        Allergies as of 09/27/2022 - Review Complete 09/27/2022  Allergen Reaction Noted   Albuterol Nausea And Vomiting 01/02/2017   Mucinex [guaifenesin er] Rash 01/02/2017     Review of Systems:    All systems reviewed and negative except where noted in HPI with exception of bilateral leg pain.      Physical Exam:  Vital signs in last 24 hours: Temp:  [97.5 F (36.4 C)-97.7 F (36.5 C)] 97.6 F (36.4 C) (11/06 0745) Pulse Rate:  [66-82] 82 (11/06 0928) Resp:  [16-20] 20 (11/06 0928) BP: (120-159)/(77-95) 159/95 (11/06 0928) SpO2:  [97 %-100 %] 100 % (11/06 0928) Weight:  [97.7 kg] 97.7 kg (11/06 0219) Last BM Date :  (Unable to asses/pt unsure) General:   Pleasant man in NAD Head:  Normocephalic and atraumatic. Eyes:   No icterus.   Conjunctiva pink. Ears:  Normal auditory acuity. Mouth: Mucosa pink  moist, no lesions. Neck:  Supple; no masses felt Lungs:  Respirations even and unlabored. Lungs clear to auscultation bilaterally.   No wheezes, crackles, or rhonchi.  Heart:  S1S2, RRR, no MRG. No edema. Abdomen:   Flat, soft, nondistended, nontender. Normal bowel sounds.  No appreciable masses or hepatomegaly. No rebound signs or other peritoneal signs. Rectal:  Not performed.  Msk:  MAEW x4, No clubbing or cyanosis. Strength 5/5. Symmetrical without gross deformities. Neurologic:  Alert and  oriented x 1  Cranial nerves II-XII intact.  Skin:  Warm, dry, pink without significant lesions or rashes. Psych:  Alert and cooperative at times however he is quite tangential and unable to follow basic commands at this time  LAB RESULTS: Recent Labs    09/29/22 0458 09/30/22 0448 10/01/22 0520 10/01/22 0940  WBC 6.2 12.0* 4.9  --   HGB 9.6* 10.0* 7.4* 8.7*  HCT 29.0* 30.3* 22.2*  --   PLT 67* 100* 67*  --    BMET Recent Labs    09/29/22 0458 09/30/22 0448 10/01/22 0520  NA 134* 137 137  K 4.4 4.2 4.0  CL 103 106 108  CO2 17* 17* 22  GLUCOSE 152* 200* 157*  BUN 59* 72* 79*  CREATININE 2.04* 2.55* 2.25*  CALCIUM 8.4* 8.4* 8.1*   LFT No results for input(s): "PROT", "ALBUMIN", "AST", "ALT", "ALKPHOS", "BILITOT", "BILIDIR", "IBILI" in the last 72 hours. PT/INR No results for input(s): "LABPROT", "INR" in the last 72 hours.  STUDIES: No results found.     Impression / Plan:   Anemia in setting of severe sepsia/ multiple antibiotics/history of adenomatous polyps/possible advanced liver disease/metastatic cancer- he has no overt signs of gib and is not iron deficient- recommend  folic acid supplement, following hemoglobin, transfusing to keep hgb >7, agree with PPI for now. Would consider palliative care to discuss care goals. Appreciate oncology consult.further recommendations to follow.  Thank you very much for this consult. These services were provided by Stephens November, NP-C, in collaboration with Lesly Rubenstein MD, with whom I have discussed this patient in full.   Stephens November, NP-C   Did attempt to contact family, there was no answer

## 2022-10-01 NOTE — Evaluation (Signed)
Physical Therapy Evaluation Patient Details Name: Roger [name] MRN: [id number] DOB: 1955/12/08 Today's Date: 10/01/2022  History of Present Illness  Pt is a 66 year old male presenting to the ED on 11/2 with  confusion and disorientation, admitted with severe sepsis secondary to UTI, acute metabolic encephalopathy, AKI, acute gouty arthorpathy; PMH significant for rostate cancer with bone metastases, hypertension, diabetes mellitus and gout  Clinical Impression  Patient seated edge of bed with OT upon arrival to room; intermittently alert, but requires near-constant cuing/stimulation to maintain.  Oriented to self only; follows simple commands, but requires consistent redirection for task comprehension and follow throughout.  Speech intermittently garbled and unintelligible; marked difficulty maintaining topic of conversation, answering questions appropriately at times.  Generally weak and deconditioned throughout all extremities; bilat LE knee/ankle ROM and WBing limited by significant pain (FACES 8/10).  Currently requiring significant physical assist (+2) for any/all functional tasks; very limited ability to initiate/tolerate closed-chain WBing through bilat LEs with transfer attempts (due to pain).  Unable to achieve full stance despite multiple attempts, raised bed surface; requiring max/total assist +2 for all efforts.  Further standing/gait efforts unsafe/unable. Would benefit from skilled PT to address above deficits and promote optimal return to PLOF.; recommend transition to STR upon discharge from acute hospitalization.      Recommendations for follow up therapy are one component of a multi-disciplinary discharge planning process, led by the attending physician.  Recommendations may be updated based on patient status, additional functional criteria and insurance authorization.  Follow Up Recommendations Skilled nursing-short term rehab (<3 hours/day) Can patient physically be transported  by private vehicle: No    Assistance Recommended at Discharge Frequent or constant Supervision/Assistance  Patient can return home with the following  Two people to help with walking and/or transfers;Two people to help with bathing/dressing/bathroom    Equipment Recommendations    Recommendations for Other Services       Functional Status Assessment Patient has had a recent decline in their functional status and demonstrates the ability to make significant improvements in function in a reasonable and predictable amount of time.     Precautions / Restrictions Precautions Precautions: Fall Restrictions Weight Bearing Restrictions: No      Mobility  Bed Mobility Overal bed mobility: Needs Assistance Bed Mobility: Supine to Sit     Supine to sit: Max assist, HOB elevated          Transfers Overall transfer level: Needs assistance Equipment used: Rolling walker (2 wheels) Transfers: Sit to/from Stand, Bed to chair/wheelchair/BSC Sit to Stand: Max assist, Total assist, +2 physical assistance          Lateral/Scoot Transfers: Max assist, Total assist, +2 physical assistance, +2 safety/equipment General transfer comment: very limited ability for active WBing/lift off from bilat LEs; extensive assist for standing attempts-unable to fully lift/clear buttocks.  Total assist for lateral scooting from bed/chair, poor active assist with movement transition    Ambulation/Gait               General Gait Details: unable/unsafe  Stairs            Wheelchair Mobility    Modified Rankin (Stroke Patients Only)       Balance Overall balance assessment: Needs assistance Sitting-balance support: No upper extremity supported, Feet supported Sitting balance-Leahy Scale: Poor Sitting balance - Comments: intermittent CGA-MIN A required due to posterior lean; limited/no spontaneous righting reactions   Standing balance support: Bilateral upper extremity supported, No  upper extremity supported Standing  balance-Leahy Scale: Zero                               Pertinent Vitals/Pain Pain Assessment Pain Assessment: Faces Faces Pain Scale: Hurts whole lot Pain Location: B knees, L ankle with bed mobility Pain Descriptors / Indicators: Discomfort, Grimacing, Guarding Pain Intervention(s): Limited activity within patient's tolerance, Monitored during session, Repositioned    Home Living Family/patient expects to be discharged to:: Private residence                   Additional Comments: pt is a poor report, attempted to contact sister Letta Median however unable to reach, left VM; pt reports he lives alone in a house and is independent in ADL/IADL, endorses he walks with a RW, 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, occupational, 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    Prior Function               Mobility Comments: Unreliable historian; will verify with family as available       Hand Dominance        Extremity/Trunk Assessment   Upper Extremity Assessment Upper Extremity Assessment: Generalized weakness    Lower Extremity Assessment Lower Extremity Assessment: Generalized weakness (grossly 3-/5 throughout; limited due to generalized pain)       Communication   Communication: Expressive difficulties  Cognition Arousal/Alertness: Lethargic Behavior During Therapy: Flat affect Overall Cognitive Status: No family/caregiver present to determine baseline cognitive functioning                                 General Comments: pt requiring frequent vcs for attention to simple tasks; delayed processing and initiation of all tasks.  Awareness of situation fluctuates; limited ability to maintain/engage in specific topic of conversation        General Comments      Exercises     Assessment/Plan    PT Assessment Patient needs continued PT services  PT Problem List Decreased strength;Decreased activity  tolerance;Decreased balance;Decreased mobility;Decreased cognition;Decreased knowledge of use of DME;Decreased coordination;Decreased safety awareness;Decreased knowledge of precautions;Cardiopulmonary status limiting activity       PT Treatment Interventions DME instruction;Gait training;Stair training;Functional mobility training;Therapeutic activities;Therapeutic exercise;Balance training;Patient/family education    PT Goals (Current goals can be found in the Care Plan section)  Acute Rehab PT Goals Patient Stated Goal: agreeable to session PT Goal Formulation: With patient Time For Goal Achievement: 10/15/22 Potential to Achieve Goals: Fair    Frequency Min 2X/week     Co-evaluation PT/OT/SLP Co-Evaluation/Treatment: Yes Reason for Co-Treatment: Complexity of the patient's impairments (multi-system involvement);For patient/therapist safety PT goals addressed during session: Mobility/safety with mobility OT goals addressed during session: ADL's and self-care       AM-PAC PT "6 Clicks" Mobility  Outcome Measure Help needed turning from your back to your side while in a flat bed without using bedrails?: Total Help needed moving from lying on your back to sitting on the side of a flat bed without using bedrails?: Total Help needed moving to and from a bed to a chair (including a wheelchair)?: Total Help needed standing up from a chair using your arms (e.g., wheelchair or bedside chair)?: Total Help needed to walk in hospital room?: Total Help needed climbing 3-5 steps with a railing? : Total 6 Click Score: 6    End of Session   Activity Tolerance:  Patient limited by lethargy;Patient limited by pain Patient left: in chair;with call bell/phone within reach;with chair alarm set Nurse Communication: Mobility status PT Visit Diagnosis: Muscle weakness (generalized) (M62.81);Difficulty in walking, not elsewhere classified (R26.2)    Time: 3244-0102 PT Time Calculation (min)  (ACUTE ONLY): 29 min   Charges:   PT Evaluation $PT Eval Moderate Complexity: 1 Mod         Jaylea Plourde H. Owens Shark, PT, DPT, NCS 10/01/22, 1:20 PM [phone]

## 2022-10-01 NOTE — Progress Notes (Signed)
Physical Therapy Treatment Patient Details Name: Roger [name] MRN: [id number] DOB: [birth year] Today's Date: 10/01/2022   History of Present Illness Pt is a 66 year old male presenting to the ED on 11/2 with  confusion and disorientation, admitted with severe sepsis secondary to UTI, acute metabolic encephalopathy, AKI, acute gouty arthorpathy; PMH significant for rostate cancer with bone metastases, hypertension, diabetes mellitus and gout    PT Comments    Called back to room per nursing to assist with return transfer to bed (per hoyer lift).  Patient much more alert and interactive upon initial arrival to room; did fatigue once returned to bed position.  Intermittently restless (appears to be hallucinating at times), but redirectable at this time.  Reviewed therapy performance and POC with daughter; answer questions about rehab options.  Daughter able to clarify social situation.  Reports patient lives alone at baseline; ambulatory without assist device (uses crutches at times when gout flares).  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  Baseline oriented and cognitively intact; driving.   Appears interested in transition to rehab at discharge to facilitate optimal return to PLOF with continued therapy efforts.  Of note, tolerated lift without difficulty; encourage OOB to chair with nursing to allow for continued mobility progression with therapy in subsequent sessions.   Recommendations for follow up therapy are one component of a multi-disciplinary discharge planning process, led by the attending physician.  Recommendations may be updated based on patient status, additional functional criteria and insurance authorization.  Follow Up Recommendations  Skilled nursing-short term rehab (<3 hours/day) Can patient physically be transported by private vehicle: No   Assistance Recommended at Discharge Frequent or constant  Supervision/Assistance  Patient can return home with the following Two people to help with walking and/or transfers;Two people to help with bathing/dressing/bathroom   Equipment Recommendations       Recommendations for Other Services       Precautions / Restrictions Precautions Precautions: Fall Restrictions Weight Bearing Restrictions: No     Mobility  Bed Mobility Overal bed mobility: Needs Assistance Bed Mobility: Rolling Rolling: Max assist   Supine to sit: Max assist, HOB elevated          Transfers Overall transfer level: Needs assistance Equipment used: Rolling walker (2 wheels) Transfers: Bed to chair/wheelchair/BSC Sit to Stand: Max assist, Total assist, +2 physical assistance          Lateral/Scoot Transfers: Max assist, Total assist, +2 physical assistance, +2 safety/equipment General transfer comment: dep assist via hoyer lift    Ambulation/Gait               General Gait Details: unable/unsafe   Stairs             Wheelchair Mobility    Modified Rankin (Stroke Patients Only)       Balance Overall balance assessment: Needs assistance Sitting-balance support: No upper extremity supported, Feet supported Sitting balance-Leahy Scale: Poor Sitting balance - Comments: intermittent CGA-MIN A required due to posterior lean; limited/no spontaneous righting reactions   Standing balance support: Bilateral upper extremity supported, No upper extremity supported Standing balance-Leahy Scale: Zero                              Cognition Arousal/Alertness: Awake/alert Behavior During Therapy: Flat affect, Impulsive Overall Cognitive Status: No family/caregiver present to determine baseline cognitive functioning  General Comments: More alert this PM; family present at bedside        Exercises      General Comments        Pertinent Vitals/Pain Pain Assessment Pain  Assessment: Faces Faces Pain Scale: Hurts whole lot Pain Location: Bilat knees Pain Descriptors / Indicators: Aching, Guarding, Grimacing, Moaning Pain Intervention(s): Limited activity within patient's tolerance, Monitored during session, Repositioned    Home Living Family/patient expects to be discharged to:: Private residence                   Additional Comments: pt is a poor report, attempted to contact sister Letta Median however unable to reach, left VM; pt reports he lives alone in a house and is independent in ADL/IADL, endorses he walks with a RW, 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, occupational, 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    Prior Function            PT Goals (current goals can now be found in the care plan section) Acute Rehab PT Goals Patient Stated Goal: agreeable to session PT Goal Formulation: With patient Time For Goal Achievement: 10/15/22 Potential to Achieve Goals: Fair Progress towards PT goals: Progressing toward goals    Frequency    Min 2X/week      PT Plan Current plan remains appropriate    Co-evaluation PT/OT/SLP Co-Evaluation/Treatment: Yes Reason for Co-Treatment: Complexity of the patient's impairments (multi-system involvement);For patient/therapist safety PT goals addressed during session: Mobility/safety with mobility OT goals addressed during session: ADL's and self-care      AM-PAC PT "6 Clicks" Mobility   Outcome Measure  Help needed turning from your back to your side while in a flat bed without using bedrails?: Total Help needed moving from lying on your back to sitting on the side of a flat bed without using bedrails?: Total Help needed moving to and from a bed to a chair (including a wheelchair)?: Total Help needed standing up from a chair using your arms (e.g., wheelchair or bedside chair)?: Total Help needed to walk in hospital room?: Total Help needed climbing 3-5 steps with a railing? : Total 6 Click  Score: 6    End of Session   Activity Tolerance: Patient limited by pain;Patient tolerated treatment well Patient left: with call bell/phone within reach;in bed;with bed alarm set;with family/visitor present Nurse Communication: Mobility status PT Visit Diagnosis: Muscle weakness (generalized) (M62.81);Difficulty in walking, not elsewhere classified (R26.2)     Time: 2353-6144 PT Time Calculation (min) (ACUTE ONLY): 14 min  Charges:  $Therapeutic Activity: 8-22 mins                     Remingtyn Depaola H. Owens Shark, PT, DPT, NCS 10/01/22, 2:06 PM [phone]

## 2022-10-02 ENCOUNTER — Inpatient Hospital Stay: Payer: 59

## 2022-10-02 DIAGNOSIS — A4151 Sepsis due to Escherichia coli [E. coli]: Secondary | ICD-10-CM | POA: Diagnosis not present

## 2022-10-02 DIAGNOSIS — A419 Sepsis, unspecified organism: Secondary | ICD-10-CM | POA: Diagnosis not present

## 2022-10-02 DIAGNOSIS — F10931 Alcohol use, unspecified with withdrawal delirium: Secondary | ICD-10-CM | POA: Diagnosis not present

## 2022-10-02 DIAGNOSIS — N390 Urinary tract infection, site not specified: Secondary | ICD-10-CM | POA: Diagnosis not present

## 2022-10-02 LAB — BASIC METABOLIC PANEL
Anion gap: 12 (ref 5–15)
BUN: 71 mg/dL — ABNORMAL HIGH (ref 8–23)
CO2: 19 mmol/L — ABNORMAL LOW (ref 22–32)
Calcium: 8.5 mg/dL — ABNORMAL LOW (ref 8.9–10.3)
Chloride: 108 mmol/L (ref 98–111)
Creatinine, Ser: 1.93 mg/dL — ABNORMAL HIGH (ref 0.61–1.24)
GFR, Estimated: 38 mL/min — ABNORMAL LOW (ref >60)
Glucose, Bld: 121 mg/dL — ABNORMAL HIGH (ref 70–99)
Potassium: 3.8 mmol/L (ref 3.5–5.1)
Sodium: 139 mmol/L (ref 135–145)

## 2022-10-02 LAB — GLUCOSE, CAPILLARY
Glucose-Capillary: 160 mg/dL — ABNORMAL HIGH (ref 70–99)
Glucose-Capillary: 116 mg/dL — ABNORMAL HIGH (ref 70–99)
Glucose-Capillary: 259 mg/dL — ABNORMAL HIGH (ref 70–99)
Glucose-Capillary: 169 mg/dL — ABNORMAL HIGH (ref 70–99)
Glucose-Capillary: 172 mg/dL — ABNORMAL HIGH (ref 70–99)
Glucose-Capillary: 147 mg/dL — ABNORMAL HIGH (ref 70–99)

## 2022-10-02 LAB — PHOSPHORUS: Phosphorus: 2.6 mg/dL (ref 2.5–4.6)

## 2022-10-02 LAB — HEMOGLOBIN
Hemoglobin: 9.7 g/dL — ABNORMAL LOW (ref 13.0–17.0)
Hemoglobin: 8.8 g/dL — ABNORMAL LOW (ref 13.0–17.0)

## 2022-10-02 LAB — MAGNESIUM: Magnesium: 2.1 mg/dL (ref 1.7–2.4)

## 2022-10-02 MED ORDER — LACTULOSE 10 GM/15ML PO SOLN
20.0000 g | Freq: Two times a day (BID) | ORAL | Status: AC
  Administered 2022-10-02 (×2): 20 g via ORAL
  Filled 2022-10-02 (×2): qty 30

## 2022-10-02 MED ORDER — OXYCODONE-ACETAMINOPHEN 5-325 MG PO TABS
1.0000 | ORAL_TABLET | ORAL | Status: DC | PRN
  Administered 2022-10-02 – 2022-10-05 (×12): 1 via ORAL
  Filled 2022-10-02 (×12): qty 1

## 2022-10-02 MED ORDER — PANTOPRAZOLE SODIUM 40 MG PO TBEC
40.0000 mg | DELAYED_RELEASE_TABLET | Freq: Every day | ORAL | Status: DC
  Administered 2022-10-03 – 2022-10-06 (×4): 40 mg via ORAL
  Filled 2022-10-02 (×6): qty 1

## 2022-10-02 MED ORDER — PREDNISONE 20 MG PO TABS
20.0000 mg | ORAL_TABLET | Freq: Every day | ORAL | Status: DC
  Administered 2022-10-03 – 2022-10-04 (×2): 20 mg via ORAL
  Filled 2022-10-02 (×2): qty 1

## 2022-10-02 NOTE — Evaluation (Signed)
Clinical/Bedside Swallow Evaluation Patient Details  Name: Roger [name] MRN: [id number] Date of Birth: 1956-06-01  Today's Date: 10/02/2022 Time: SLP Start Time (ACUTE ONLY): 0845 SLP Stop Time (ACUTE ONLY): 0945 SLP Time Calculation (min) (ACUTE ONLY): 60 min  Past Medical History:  Past Medical History:  Diagnosis Date   Chest pain    a. 04/2016 MV: small, mild apical defect c/w apical ischemia, EF 55-65%-->Low risk study.   Diabetes mellitus without complication ([place])    Gout    Hematochezia    History of echocardiogram    a. 04/2016 Echo: EF 55-60%, mild LVH, no rwma, midly dil LA.   History of heart attack    Hypertension    Morbid obesity ([place])    Past Surgical History:  Past Surgical History:  Procedure Laterality Date   COLONOSCOPY WITH PROPOFOL N/A 08/05/2018   Procedure: COLONOSCOPY WITH PROPOFOL;  Surgeon: Lin Landsman, MD;  Location: [facility] ENDOSCOPY;  Service: Gastroenterology;  Laterality: N/A;   thumb surgery     HPI:  Pt  is a 66 year old with past medical history of recent diagnosis of prostate cancer with bone metastases, hypertension, diabetes mellitus and gout who has been noncompliant with medications and presented to the emergency room on 11/2 for confusion and disorientation.  Work-up in the emergency room revealed severe sepsis felt to be secondary to UTI from indwelling Foley catheter.  Patient started on IV fluids and antibiotics and placed in stepdown unit.  By 11/3, patient more alert and appropriate and transferred to medical floor. Blood cultures growing out E. coli.  Urine culture also positive for E. Coli, Klebsiella oxytoca, Enterococcus faecalis.  Patient initially treated with Rocephin, now changed to Unasyn.   Patient had a urinary retention secondary to blocked urinary catheter 11/4, flushed, resolved.  Patient also developed significant agitation on 11/4.  Patient is a heavy alcohol drinker.  Started CIWA protocol.   Admitting dxs include:  severe sepsis w/ UTI; Acute metabolic encephalopathy;  Delirium;  Alcohol abuse.   CXR imaging at admit: Low inspiration exaggerating the mediastinum and heart. No  evidence of acute chest process. Limited view of the bases.  2. Cardiomegaly.  3. Aortic atherosclerosis.  4. Widespread bony metastases.    Assessment / Plan / Recommendation  Clinical Impression   Pt seen today for BSE. Sitter present in room.  Pt is on RA, afebrile. WBC wnl.   Pt appears to present w/ grossly adequate oropharyngeal phase swallow function w/ No overt oropharyngeal phase dysphagia noted, No neuromuscular deficits noted. However, pt's Alertness is impacted by ongoing Ativan and Delirium which can impact attention/swallowing. Pt consumed po trials w/ No immediate, overt, clinical s/s of aspiration during po trials. Pt appears at reduced risk for aspiration/aspiration pneumonia when following general aspiration precautions, using a slightly modified diet when eating w/out Dentures, and when given support/Supervision w/ meals d/t the declined mental status/alertness.   He does have challenging factors that could impact oropharyngeal swallowing to include metabolic encephalopathy and delirium from ETOH abuse/withdrawal, ongoing Ativan, pain/discomfort from Ca, fatigue/weakness, and current Edentulous status. These factors can increase risk for aspiration, dysphagia as well as decreased oral intake overall.   During po trials, pt required MOD+ verbal/tactile stim to maintain Attention to task. He consumed all consistencies w/ No overt coughing, decline in vocal quality, or change in respiratory presentation during/post trials. Oral phase appeared grossly [facility] w/ timely bolus management and control of bolus propulsion for A-P transfer for swallowing -- intermittent verbal cues  given to attend to lingual sweeping and swallowing to fully clear mouth of any bolus/bolus residue. Pt was Edentulous during this assessment which impacts  ability to fully masticate solids -- trials were broken small and moistened well for ease of mashing/gumming. Oral clearing achieved w/ all trial consistencies given time.  OM Exam appeared [facility] w/ no unilateral weakness noted. Pt helped to feed self w/ full support.   Recommend a more mech soft diet w/ CHOPPED meats, moistened foods; Thin liquids -- carefully monitor straw use, and pt should help to Hold Cup when drinking. Recommend general aspiration precautions, Pills CRUSHED in Puree for safer, easier swallowing. Tray setup and Supervision during meals; assistance w/ feeding. MUST be fully awake to take any po's -- NSG instruction given/posted in room and chart. Less sedating Meds recommended if possible. Education given on Pills in Puree; food consistencies and easy to eat options; general aspiration precautions to pt and Son via TC. NSG to monitor at meals and reconsult if any new needs arise. NSG updated, agreed. MD updated. Recommend Dietician f/u for support. SLP Visit Diagnosis: Dysphagia, unspecified (R13.10) (ETOH abuse; withdrawal - Ativan.)    Aspiration Risk  Mild aspiration risk;Risk for inadequate nutrition/hydration (reduced w/ monitoring during oral intake for Alertness; general aspiration precs.)    Diet Recommendation   mech soft diet w/ CHOPPED meats, moistened foods; Thin liquids -- carefully monitor straw use, and pt should help to Hold Cup when drinking. Recommend general aspiration precautions. Tray setup and Supervision during meals; assistance w/ feeding. MUST be fully awake to take any po's.  Medication Administration: Crushed with puree    Other  Recommendations Recommended Consults:  (Palliative Care consult for [place]; Dietician f/u) Oral Care Recommendations: Oral care BID;Oral care before and after PO;Staff/trained caregiver to provide oral care Other Recommendations:  (n/a)    Recommendations for follow up therapy are one component of a multi-disciplinary discharge  planning process, led by the attending physician.  Recommendations may be updated based on patient status, additional functional criteria and insurance authorization.  Follow up Recommendations No SLP follow up      Assistance Recommended at Discharge None  Functional Status Assessment Patient has had a recent decline in their functional status and demonstrates the ability to make significant improvements in function in a reasonable and predictable amount of time.  Frequency and Duration  (n/a)   (n/a)       Prognosis Prognosis for Safe Diet Advancement: Fair (-Good) Barriers to Reach Goals: Time post onset;Severity of deficits Barriers/Prognosis Comment: Ativan; delirium      Swallow Study   General Date of Onset: 09/27/22 HPI: Pt  is a 66 year old with past medical history of recent diagnosis of prostate cancer with bone metastases, hypertension, diabetes mellitus and gout who has been noncompliant with medications and presented to the emergency room on 11/2 for confusion and disorientation.  Work-up in the emergency room revealed severe sepsis felt to be secondary to UTI from indwelling Foley catheter.  Patient started on IV fluids and antibiotics and placed in stepdown unit.  By 11/3, patient more alert and appropriate and transferred to medical floor. Blood cultures growing out E. coli.  Urine culture also positive for E. Coli, Klebsiella oxytoca, Enterococcus faecalis.  Patient initially treated with Rocephin, now changed to Unasyn.   Patient had a urinary retention secondary to blocked urinary catheter 11/4, flushed, resolved.  Patient also developed significant agitation on 11/4.  Patient is a heavy alcohol drinker.  Started CIWA protocol.  Admitting dxs include: severe sepsis w/ UTI; Acute metabolic encephalopathy;  Delirium;  Alcohol abuse.   CXR imaging at admit: Low inspiration exaggerating the mediastinum and heart. No  evidence of acute chest process. Limited view of the bases.   2. Cardiomegaly.  3. Aortic atherosclerosis.  4. Widespread bony metastases. Type of Study: Bedside Swallow Evaluation Previous Swallow Assessment: none Diet Prior to this Study: Regular;Thin liquids Temperature Spikes Noted: No (wbc 4.9) Respiratory Status: Room air History of Recent Intubation: No Behavior/Cognition: Cooperative;Pleasant mood;Confused;Lethargic/Drowsy;Requires cueing;Distractible Oral Cavity Assessment: Within Functional Limits Oral Care Completed by SLP: Recent completion by staff Oral Cavity - Dentition: Edentulous (has Dentures at home - Son bringing them) Vision: Functional for self-feeding Self-Feeding Abilities: Able to feed self;Needs assist;Needs set up;Total assist (drowsy this session - Ativan, ETOH) Patient Positioning: Upright in bed (needed positioning support) Baseline Vocal Quality: Normal;Low vocal intensity (mumbled speech) Volitional Cough: Strong Volitional Swallow: Able to elicit    Oral/Motor/Sensory Function Overall Oral Motor/Sensory Function: Within functional limits   Ice Chips Ice chips: Within functional limits Presentation: Spoon (fed; 2 trials)   Thin Liquid Thin Liquid: Within functional limits Presentation: Cup;Self Fed;Straw (4 trials via cup; 5 trials via straw - cued)    Nectar Thick Nectar Thick Liquid: Not tested   Honey Thick Honey Thick Liquid: Not tested   Puree Puree: Within functional limits Presentation: Spoon (fed; 5 trials)   Solid     Solid: Impaired Presentation: Spoon (fed; 4 trials) Oral Phase Impairments: Impaired mastication (edentulous; drowsy) Oral Phase Functional Implications: Impaired mastication;Prolonged oral transit Pharyngeal Phase Impairments:  (none)       Orinda Kenner, MS, CCC-SLP Speech Language Pathologist Rehab Services; [place] [phone] (ascom) Marchella Hibbard 10/02/2022,4:20 PM

## 2022-10-02 NOTE — NC FL2 (Signed)
[place] LEVEL OF CARE SCREENING TOOL     IDENTIFICATION  Patient Name: Roger [name] Birthdate: 28-Aug-1956 Sex: male Admission Date (Current Location): 09/27/2022  [facility] [place] Number:      Facility and Address:         Provider Number: [id number]  Attending Physician Name and Address:  Sharen Hones, MD  Relative Name and Phone Number:       Current Level of Care: Hospital Recommended Level of Care: [place] Prior Approval Number:    Date Approved/Denied:   PASRR Number: [id number]  Discharge Plan: SNF    Current Diagnoses: Patient Active Problem List   Diagnosis Date Noted   Alcohol abuse 10/01/2022   Delirium tremens ([place]) 10/01/2022   Septicemia due to E. 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   AKI (acute kidney injury) ([place]) 05/25/1600   Acute metabolic encephalopathy 09/32/3557   Lymphadenopathy 09/24/2022   Elevated PSA 09/24/2022   Alcohol use 09/24/2022   Acute gout 09/24/2022   Folate deficiency 09/24/2022   HLD (hyperlipidemia) 04/10/2021   Anemia 07/21/2018   Acute gouty arthropathy 05/18/2016   Erectile dysfunction 05/18/2016   Diabetes mellitus without complication ([place]) 32/20/2542   Essential hypertension 04/16/2016    Orientation RESPIRATION BLADDER Height & Weight     Self  Normal Indwelling catheter Weight: 97.7 kg Height:  '5\' 9"'$  (175.3 cm)  BEHAVIORAL SYMPTOMS/MOOD NEUROLOGICAL BOWEL NUTRITION STATUS      Continent Diet (Heart Healthy)  AMBULATORY STATUS COMMUNICATION OF NEEDS Skin   Extensive Assist Verbally Normal                       Personal Care Assistance Level of Assistance              Functional Limitations Info             SPECIAL CARE FACTORS FREQUENCY   PT (By licensed PT), OT (By licensed OT)                    Contractures Contractures Info: Not present    Additional Factors Info  Code Status, Allergies Code Status Info: Full Allergies Info: Albuterol, Mucinex (Guaifenesin Er)           Current Medications (10/02/2022):  This is the current hospital active medication list Current Facility-Administered Medications  Medication Dose Route Frequency Provider Last Rate Last Admin   acetaminophen (TYLENOL) tablet 650 mg  650 mg Oral Q6H PRN Annita Brod, MD   650 mg at 09/28/22 2028   Or   acetaminophen (TYLENOL) suppository 650 mg  650 mg Rectal Q6H PRN Annita Brod, MD       allopurinol (ZYLOPRIM) tablet 200 mg  200 mg Oral Daily Sharen Hones, MD   200 mg at 10/02/22 1000   Ampicillin-Sulbactam (UNASYN) 3 g in sodium chloride 0.9 % 100 mL IVPB  3 g Intravenous Q6H Sharen Hones, MD 200 mL/hr at 10/02/22 0600 3 g at 10/02/22 0600   aspirin EC tablet 81 mg  81 mg Oral Daily Sharen Hones, MD   81 mg at 10/02/22 1002   Chlorhexidine Gluconate Cloth 2 % PADS 6 each  6 each Topical Daily Annita Brod, MD   6 each at 10/01/22 1205  enoxaparin (LOVENOX) injection 40 mg  40 mg Subcutaneous Q24H Annita Brod, MD   40 mg at 63/84/53 6468   folic acid (FOLVITE) tablet 1 mg  1 mg Oral Daily Sharen Hones, MD   1 mg at 10/02/22 1002   insulin aspart (novoLOG) injection 0-15 Units  0-15 Units Subcutaneous Q4H Annita Brod, MD   3 Units at 10/02/22 0438   isosorbide mononitrate (IMDUR) 24 hr tablet 30 mg  30 mg Oral Daily Sharen Hones, MD   30 mg at 10/02/22 1001   lactated ringers infusion   Intravenous Continuous Sharen Hones, MD 100 mL/hr at 10/01/22 1420 New Bag at 10/01/22 1420   LORazepam (ATIVAN) tablet 1-4 mg  1-4 mg Oral Q1H PRN Sharen Hones, MD       Or   LORazepam (ATIVAN) injection 1-4 mg  1-4 mg Intravenous Q1H PRN Sharen Hones, MD   2 mg at 10/01/22 2324   metoprolol tartrate (LOPRESSOR) tablet 25 mg   25 mg Oral BID Sharen Hones, MD   25 mg at 10/02/22 1001   multivitamin with minerals tablet 1 tablet  1 tablet Oral Daily Sharen Hones, MD   1 tablet at 10/02/22 1002   ondansetron (ZOFRAN) tablet 4 mg  4 mg Oral Q6H PRN Annita Brod, MD       Or   ondansetron [facility]) injection 4 mg  4 mg Intravenous Q6H PRN Annita Brod, MD       oxyCODONE-acetaminophen (PERCOCET) 7.5-325 MG per tablet 1 tablet  1 tablet Oral Q4H PRN Sharen Hones, MD   1 tablet at 10/02/22 0959   pantoprazole (PROTONIX) injection 40 mg  40 mg Intravenous Q12H Sharen Hones, MD   40 mg at 10/02/22 1003   predniSONE (DELTASONE) tablet 40 mg  40 mg Oral Q breakfast Sharen Hones, MD   40 mg at 10/02/22 1001   QUEtiapine (SEROQUEL) tablet 25 mg  25 mg Oral QHS Sharen Hones, MD   25 mg at 10/01/22 2245   rosuvastatin (CRESTOR) tablet 10 mg  10 mg Oral Daily Sharen Hones, MD   10 mg at 10/01/22 0847   thiamine (VITAMIN B1) tablet 100 mg  100 mg Oral Daily Sharen Hones, MD   100 mg at 10/02/22 1002   Or   thiamine (VITAMIN B1) injection 100 mg  100 mg Intravenous Daily Sharen Hones, MD         Discharge Medications: Please see discharge summary for a list of discharge medications.  Relevant Imaging Results:  Relevant Lab Results:   Additional Information ss # [id number]  Beverly Sessions, RN

## 2022-10-02 NOTE — Consult Note (Signed)
1600: Attempted to consult on this patient. Consult was put in for "substance abuse. Forced by system to consult." My colleague, Waylan Boga, PMHNP, attempted consult this morning and patient was with family, who was trying to feed him breakfast and he was not responsive to questioning. On approach, patient only moaned and did not open his eyes. He is wearing MITS. Patient has a 1:1 sitter at bedside who states that he does not communicate.  I spoke with patient's bedside RN, Earley Brooke, who reports that patient has been a little more alert this afternoon, but only to self. It  is this provider's opinion that consult is not appropriate at this time and patient may benefit from a palliative care consult. Discussed with Dr. Roosevelt Locks, who agrees.   Sherlon Handing, PMHNP

## 2022-10-02 NOTE — Progress Notes (Addendum)
Progress Note   Patient: Roger [name] HQI:[id number] DOB: [birth year] DOA: 09/27/2022     5 DOS: the patient was seen and examined on 10/02/2022   Brief hospital course: Patient is a 66 year old with past medical history of recent diagnosis of prostate cancer with bone metastases, hypertension, diabetes mellitus and gout who has been noncompliant with medications and presented to the emergency room on 11/2 for confusion and disorientation.    Work-up in the emergency room revealed severe sepsis felt to be secondary to UTI from indwelling Foley catheter.  Patient started on IV fluids and antibiotics and placed in stepdown unit.  By 11/3, patient more alert and appropriate and transferred to medical floor. Blood cultures growing out E. coli.  Urine culture also positive for E. Coli, Klebsiella oxytoca, Enterococcus faecalis.  Patient initially treated with Rocephin, now changed to Unasyn.  Patient had a urinary retention secondary to blocked urinary catheter 11/4, flushed, resolved.  Patient also developed significant agitation on 11/4.  Patient is a heavy alcohol drinker.  Started CIWA protocol.  Assessment and Plan: Severe sepsis secondary to UTI [facility]) Patient met criteria for severe sepsis indwelling Foley catheter present E. coli urinary tract infection. E. coli septicemia.  Met criteria for severe sepsis on admission secondary to fever, tachycardia and hypotension as well as AKI and acute encephalopathy.  Sepsis stabilizing.  Blood cultures growing out E. coli.   Urine culture also positive for E. Coli, Klebsiella oxytoca, Enterococcus faecalis.   Antibiotic to Unasyn to cover all bacteria in the urine. Patient no longer has any urinary retention with a Foley catheter. Continue Unasyn.   Prostate cancer metastatic to bone Followed by oncology, additional work-up and treatment as outpatient.  Acute metabolic encephalopathy Delirium tremens Alcohol abuse. Patient had a significant  agitation at night, could not sleep.   Discussed with the patient son, apparently patient is a very heavy alcohol drinker.  His condition is consistent with delirium tremens from alcohol withdrawal.   Continue CIWA protocol.  If condition does not improve, we may consider an MRI of the brain to rule out metastasis from prostate cancer. Still has significant effusion and occasional agitation. Liver ultrasound showed evidence of steatohepatitis versus liver cirrhosis.   AKI (acute kidney injury) ([place]) Urinary retention.  Metabolic acidosis. Patient had a bladder residual of 400 mL despite Foley cath in place, flushed Foley catheter, repeat residual less than 90 mL.   Function gradually improving, continue lactated Ringer solution.   Acute gouty arthropathy Had a significant swelling the right knee, I obtain x-ray, no evidence of infection, small amount of joint effusion.   Right knee swelling much better, reduced steroid dose.      Anemia Patient had a acute drop of hemoglobin to 7.4, but quickly bounced back.  Patient does not seem to have any GI bleed.  Seen by GI, no plan for EGD.   Essential hypertension Blood pressure running high, restarted home medicines.   Uncontrolled type 2 diabetes with hyperglycemia. Sliding scale coverage   Overweight (BMI 25.0-29.9) Patient meets criteria BMI greater than 25      Subjective:  Patient is confused, no agitation today.  He also seems to have some visual hallucination.  No shortness of breath.  Physical Exam: Vitals:   10/02/22 0246 10/02/22 0545 10/02/22 0953 10/02/22 1147  BP: (!) 166/117 (!) 149/71 (!) 146/109 102/67  Pulse: 94 96 (!) 114 86  Resp: _0 (!) 21  Temp: 97.9 F (36.6 C) 98.3 F (  36.8 C) 98.2 F (36.8 C) 97.7 F (36.5 C)  TempSrc: Oral Oral Oral Oral  SpO2: 98% 100% 99% 99%  Weight:      Height:       General exam: Appears calm and comfortable  Respiratory system: Clear to auscultation. Respiratory  effort normal. Cardiovascular system: S1 & S2 heard, RRR. No JVD, murmurs, rubs, gallops or clicks. No pedal edema. Gastrointestinal system: Abdomen is nondistended, soft and nontender. No organomegaly or masses felt. Normal bowel sounds heard. Central nervous system: Confused and oriented x1.  No focal neurologic changes. Extremities: Symmetric 5 x 5 power. Skin: No rashes, lesions or ulcers Psychiatry: Flat affect.  Data Reviewed:  Lab results reviewed.  Family Communication: Uncle updated at bedside.  Disposition: Status is: Inpatient Remains inpatient appropriate because: Severity of disease, IV antibiotics.  Planned Discharge Destination:  TBD    Time spent: 35 minutes  Author: Sharen Hones, MD 10/02/2022 2:03 PM  For on call review [email or web address].

## 2022-10-03 DIAGNOSIS — N390 Urinary tract infection, site not specified: Secondary | ICD-10-CM | POA: Diagnosis not present

## 2022-10-03 DIAGNOSIS — A419 Sepsis, unspecified organism: Secondary | ICD-10-CM | POA: Diagnosis not present

## 2022-10-03 LAB — GLUCOSE, CAPILLARY
Glucose-Capillary: 115 mg/dL — ABNORMAL HIGH (ref 70–99)
Glucose-Capillary: 134 mg/dL — ABNORMAL HIGH (ref 70–99)
Glucose-Capillary: 151 mg/dL — ABNORMAL HIGH (ref 70–99)
Glucose-Capillary: 191 mg/dL — ABNORMAL HIGH (ref 70–99)
Glucose-Capillary: 191 mg/dL — ABNORMAL HIGH (ref 70–99)
Glucose-Capillary: 206 mg/dL — ABNORMAL HIGH (ref 70–99)

## 2022-10-03 LAB — CBC
HCT: 22.6 % — ABNORMAL LOW (ref 39.0–52.0)
Hemoglobin: 7.5 g/dL — ABNORMAL LOW (ref 13.0–17.0)
MCH: 32.9 pg (ref 26.0–34.0)
MCHC: 33.2 g/dL (ref 30.0–36.0)
MCV: 99.1 fL (ref 80.0–100.0)
Platelets: 102 10*3/uL — ABNORMAL LOW (ref 150–400)
RBC: 2.28 MIL/uL — ABNORMAL LOW (ref 4.22–5.81)
RDW: 13.7 % (ref 11.5–15.5)
WBC: 11.5 10*3/uL — ABNORMAL HIGH (ref 4.0–10.5)
nRBC: 0.3 % — ABNORMAL HIGH (ref 0.0–0.2)

## 2022-10-03 LAB — BASIC METABOLIC PANEL
Anion gap: 7 (ref 5–15)
BUN: 54 mg/dL — ABNORMAL HIGH (ref 8–23)
CO2: 24 mmol/L (ref 22–32)
Calcium: 8.3 mg/dL — ABNORMAL LOW (ref 8.9–10.3)
Chloride: 111 mmol/L (ref 98–111)
Creatinine, Ser: 1.56 mg/dL — ABNORMAL HIGH (ref 0.61–1.24)
GFR, Estimated: 49 mL/min — ABNORMAL LOW (ref >60)
Glucose, Bld: 123 mg/dL — ABNORMAL HIGH (ref 70–99)
Potassium: 3.9 mmol/L (ref 3.5–5.1)
Sodium: 142 mmol/L (ref 135–145)

## 2022-10-03 LAB — MAGNESIUM: Magnesium: 1.9 mg/dL (ref 1.7–2.4)

## 2022-10-03 LAB — PHOSPHORUS: Phosphorus: 2.3 mg/dL — ABNORMAL LOW (ref 2.5–4.6)

## 2022-10-03 NOTE — Progress Notes (Signed)
PROGRESS NOTE Roger [name]  OHY:[id number] DOB: July 09, 1956 DOA: 09/27/2022 PCP: Lahoma Rocker, MD   Brief Narrative/Hospital Course: 66-yom w/ recent diagnosis of prostate cancer with bone metastases, hypertension, diabetes mellitus and gout who has been noncompliant with medications and presented to the emergency room on 11/2 for confusion and disorientation.   In ED: found to have severe sepsis felt to be secondary to UTI from indwelling Foley catheter.  Patient started on IV fluids and antibiotics and placed in stepdown unit.  11/3, patient more alert and appropriate and transferred to medical floor. Blood cultures growing out E. coli.  Urine culture also positive for E. Coli, Klebsiella oxytoca, Enterococcus faecalis.  Patient initially treated with Rocephin>changed to Unasyn. Patient had a urinary retention secondary to blocked urinary catheter 11/4, flushed, resolved.  Patient also developed significant agitation on 11/4 and with his heavy alcohol use history- started CIWA protocol.  Psychiatry was consulte    Subjective: Seen and examined this morning.  Patient family also walked in.  Patient appears more alert awake less confused today, oriented to place people Complains of pain left lower leg mostly around the knee and ankle more on the left side and attributes to his gout Afebrile overnight Labs with improving creatinine 1.5 from 1.9, hemoglobin 8.8> 7.5gm, platelet 62> 102 Urine output 2300 mL  Assessment and Plan: Principal Problem:   Severe sepsis secondary to UTI [facility]) Active Problems:   Acute metabolic encephalopathy   Acute gouty arthropathy   AKI (acute kidney injury) ([place])   Indwelling Foley catheter present   Prostate cancer metastatic to bone ([place])   Anemia   Essential hypertension   Diabetes mellitus without complication ([place])   Overweight (BMI 25.0-29.9)   Thrombocytopenia ([place])   Septicemia due to E. coli ([facility])   Alcohol abuse   Delirium tremens  ([place])  Severe sepsis with E. coli bacteremia due to UTI: UTI due to E. Coli: Sepsis POA in the setting of E. coli UTI.  Urine and blood culture with E. coli, urine culture also showed Klebsiella oxytocin Enterococcus faecalis> antibiotics changed to Unasyn 11/6 per  C/S. At this time hemodynamically stable, mildly with mild leukocytosis  Prostate cancer with mets to bone followed by oncology outpatient  Acute metabolic encephalopathy Delirium tremens: Patient had confusion/encephalopathy due to sepsis and alcohol withdrawal.  Appears much more alert awake interactive today, complains of pain.  Family endorsed he is a heavy drinker with liquor and beer.  Managed CIWA Ativan, continue to provide supportive care, delirium precaution PT OT  Alcohol abuse: Ultrasound showing steatohepatitis versus liver cirrhosis, continue thiamine, folate multivitamins.  Acute kidney injury: Peaked to 2.2 but now improving as below, cont ivf Recent Labs    03/12/22 1251 04/18/22 0928 09/08/22 0826 09/27/22 1954 09/28/22 0609 09/29/22 0458 09/30/22 0448 10/01/22 0520 10/02/22 0608 10/03/22 0621  BUN 16 24* 15 51* 54* 59* 72* 79* 71* 54*  CREATININE 1.13 1.32* 1.00 1.85* 2.10* 2.04* 2.55* 2.25* 1.93* 1.56*    Urine retention needing Foley catheter, TOV once more mobile  Acute gout arthropathy with swelling of his knees x-ray no acute finding with a small amount of joint effusion.  He is on prednisone, allopurinol.  Unable to use colchicine due to his elevated renal function.  Continue oxycodone Tylenol for pain control.  Continue PT OT  Anemia: Multifactorial with chronic disease and folate deficiency:had a acute drop of hemoglobin to 7.4, but quickly bounced back.Seen by GI, no plan for EGD.  Continue folate supplementation  trend H&H as below and transfuse for less than 7 g Recent Labs    09/24/22 1401 09/27/22 1954 09/29/22 0458 09/30/22 0447 09/30/22 0448 10/01/22 0520 10/01/22 0940  10/01/22 1804 10/02/22 0148 10/02/22 0946 10/03/22 0621  HGB  --    < > 9.6*  --    < > 7.4* 8.7* 8.9* 9.7* 8.8* 7.5*  MCV  --    < > 99.3  --    < > 97.4  --   --   --   --  99.1  VITAMINB12 475  --   --   --   --   --   --   --   --   --   --   FOLATE 4.3*  --   --   --   --   --   --   --   --   --   --   FERRITIN  --   --   --  3,489*  --   --   --   --   --   --   --   TIBC  --   --   --  147*  --   --   --   --   --   --   --   IRON  --   --   --  69  --   --   --   --   --   --   --   RETICCTPCT  --   --  0.9  --   --   --   --   --   --   --   --    < > = values in this interval not displayed.    Essential hypertension BP fairly controlled, continue with metoprolol, Imdur HLD continue statin Diabetes mellitus type 2, A1c 6.5 continue sliding scale insulin. Recent Labs  Lab 09/27/22 1954 09/27/22 2332 10/02/22 1940 10/02/22 2315 10/03/22 0501 10/03/22 0804 10/03/22 1118  GLUCAP  --    < > 172* 147* 115* 134* 151*  HGBA1C 6.5*  --   --   --   --   --   --    < > = values in this interval not displayed.    Class I Obesity:Patient's Body mass index is 31.81 kg/m. : Will benefit with PCP follow-up, weight loss  healthy lifestyle and outpatient sleep evaluation.   DVT prophylaxis: enoxaparin (LOVENOX) injection 40 mg Start: 09/28/22 0800 Code Status:   Code Status: Full Code Family Communication: plan of care discussed with patient/family members at bedside. Patient status is: Inpatient because of acute encephalopathy sepsis Level of care: Med-Surg   Dispo: The patient is from: home            Anticipated disposition: SNF in 3-4 days  Mobility Assessment (last 72 hours)     Mobility Assessment     Row Name 10/03/22 1100 10/03/22 0928 10/01/22 1402 10/01/22 1256 10/01/22 1106   What is the highest level of mobility based on the progressive mobility assessment? Level 2 (Chairfast) - Balance while sitting on edge of bed and cannot stand Level 2 (Chairfast) - Balance  while sitting on edge of bed and cannot stand Level 2 (Chairfast) - Balance while sitting on edge of bed and cannot stand Level 2 (Chairfast) - Balance while sitting on edge of bed and cannot stand Level 1 (Bedfast) - Unable to balance while sitting on edge of bed  Objective: Vitals last 24 hrs: Vitals:   10/02/22 2026 10/03/22 0300 10/03/22 0713 10/03/22 1116  BP: 139/83 135/87 (!) 144/84 106/63  Pulse: 95 94 100 87  Resp: '16 18 18 18  '$ Temp: (!) 97.3 F (36.3 C) 97.9 F (36.6 C) 98.1 F (36.7 C) 97.6 F (36.4 C)  TempSrc: Oral Oral Oral Oral  SpO2: 97% 98% 98% 98%  Weight:      Height:       Weight change:   Physical Examination: General exam: alert awake, older than stated age HEENT:Oral mucosa moist, Ear/Nose WNL grossly Respiratory system: bilaterally clear BS, no use of accessory muscle Cardiovascular system: S1 & S2 +, No JVD. Gastrointestinal system: Abdomen soft,NT,ND, BS+ Nervous System:Alert, awake, oriented to self place people  Extremities: LE edema neg, tenderness mostly on the left lower extremity around knee Skin: No rashes,no icterus. MSK: Normal muscle bulk,tone, power  Medications reviewed:  Scheduled Meds:  allopurinol  200 mg Oral Daily   aspirin EC  81 mg Oral Daily   Chlorhexidine Gluconate Cloth  6 each Topical Daily   enoxaparin (LOVENOX) injection  40 mg Subcutaneous C58I   folic acid  1 mg Oral Daily   insulin aspart  0-15 Units Subcutaneous Q4H   isosorbide mononitrate  30 mg Oral Daily   metoprolol tartrate  25 mg Oral BID   multivitamin with minerals  1 tablet Oral Daily   pantoprazole  40 mg Oral Daily   predniSONE  20 mg Oral Q breakfast   rosuvastatin  10 mg Oral Daily   thiamine  100 mg Oral Daily   Or   thiamine  100 mg Intravenous Daily  Continuous Infusions:  ampicillin-sulbactam (UNASYN) IV 3 g (10/03/22 1142)   lactated ringers 100 mL/hr at 10/03/22 5027     Diet Order             DIET DYS 3 Room service  appropriate? Yes with Assist; Fluid consistency: Thin  Diet effective now                  Intake/Output Summary (Last 24 hours) at 10/03/2022 1255 Last data filed at 10/03/2022 0900 Gross per 24 hour  Intake 2490.23 ml  Output 1700 ml  Net 790.23 ml   Net IO Since Admission: 4,216.19 mL [10/03/22 1255]  Wt Readings from Last 3 Encounters:  10/01/22 97.7 kg  09/08/22 99.8 kg  03/12/22 98.9 kg     Unresulted Labs (From admission, onward)     Start     Ordered   10/04/22 0500  Creatinine, serum  (enoxaparin (LOVENOX)    CrCl >/= 30 ml/min)  Weekly,   R     Comments: while on enoxaparin therapy    09/27/22 2306          Data Reviewed: I have personally reviewed following labs and imaging studies CBC: Recent Labs  Lab 09/27/22 1954 09/28/22 0609 09/29/22 0458 09/30/22 0448 10/01/22 0520 10/01/22 0940 10/01/22 1804 10/02/22 0148 10/02/22 0946 10/03/22 0621  WBC 9.4 6.1 6.2 12.0* 4.9  --   --   --   --  11.5*  NEUTROABS 8.4* 4.9  --   --   --   --   --   --   --   --   HGB 9.9* 8.2* 9.6* 10.0* 7.4* 8.7* 8.9* 9.7* 8.8* 7.5*  HCT 30.0* 24.5* 29.0* 30.3* 22.2*  --   --   --   --  22.6*  MCV 99.0  100.0 99.3 97.7 97.4  --   --   --   --  99.1  PLT 109* 64* 67* 100* 67*  --   --   --   --  161*   Basic Metabolic Panel: Recent Labs  Lab 09/28/22 0609 09/29/22 0458 09/30/22 0448 10/01/22 0520 10/02/22 0608 10/03/22 0621  NA 136 134* 137 137 139 142  K 3.8 4.4 4.2 4.0 3.8 3.9  CL 104 103 106 108 108 111  CO2 24 17* 17* 22 19* 24  GLUCOSE 119* 152* 200* 157* 121* 123*  BUN 54* 59* 72* 79* 71* 54*  CREATININE 2.10* 2.04* 2.55* 2.25* 1.93* 1.56*  CALCIUM 7.9* 8.4* 8.4* 8.1* 8.5* 8.3*  MG 1.9  --  2.1 2.1 2.1 1.9  PHOS 4.1  --   --   --  2.6 2.3*   GFR: Estimated Creatinine Clearance: 55.1 mL/min (A) (by C-G formula based on SCr of 1.56 mg/dL (H)). Liver Function Tests: Recent Labs  Lab 09/27/22 1954 09/28/22 0609  AST 26 29  ALT 11 13  ALKPHOS 295* 242*   BILITOT 1.4* 1.1  PROT 6.6 5.7*  ALBUMIN 2.2* 2.0*  Coagulation Profile: Recent Results (from the past 240 hour(s))  Blood Culture (routine x 2)     Status: Abnormal   Collection Time: 09/27/22  7:53 PM   Specimen: BLOOD  Result Value Ref Range Status   Specimen Description   Final    BLOOD LEFT FA Performed at [facility], [address], [place], [place] [address]    Special Requests   Final    BOTTLES DRAWN AEROBIC AND ANAEROBIC Blood Culture results may not be optimal due to an excessive volume of blood received in culture bottles Performed at [facility], [address] [place], [place] [address]    Culture  Setup Time   Final    GRAM NEGATIVE RODS IN BOTH AEROBIC AND ANAEROBIC BOTTLES CRITICAL RESULT CALLED TO, READ BACK BY AND VERIFIED WITH: Nat Math MERRIL 09/28/22 0745 MW Performed at [facility], [place] [address], , [place] [address]    Culture ESCHERICHIA COLI (A)  Final   Report Status 09/30/2022 FINAL  Final   Organism ID, Bacteria ESCHERICHIA COLI  Final      Susceptibility   Escherichia coli - MIC*    AMPICILLIN <=2 SENSITIVE Sensitive     CEFAZOLIN <=4 SENSITIVE Sensitive     CEFEPIME <=0.12 SENSITIVE Sensitive     CEFTAZIDIME <=1 SENSITIVE Sensitive     CEFTRIAXONE <=0.25 SENSITIVE Sensitive     CIPROFLOXACIN <=0.25 SENSITIVE Sensitive     GENTAMICIN <=1 SENSITIVE Sensitive     IMIPENEM <=0.25 SENSITIVE Sensitive     TRIMETH/SULFA <=20 SENSITIVE Sensitive     AMPICILLIN/SULBACTAM <=2 SENSITIVE Sensitive     PIP/TAZO <=4 SENSITIVE Sensitive     * ESCHERICHIA COLI  Blood Culture ID Panel (Reflexed)     Status: Abnormal   Collection Time: 09/27/22  7:53 PM  Result Value Ref Range Status   Enterococcus faecalis NOT DETECTED NOT DETECTED Final   Enterococcus Faecium NOT DETECTED NOT DETECTED Final   Listeria monocytogenes NOT DETECTED NOT DETECTED Final   Staphylococcus species NOT DETECTED NOT DETECTED Final    Staphylococcus aureus (BCID) NOT DETECTED NOT DETECTED Final   Staphylococcus epidermidis NOT DETECTED NOT DETECTED Final   Staphylococcus lugdunensis NOT DETECTED NOT DETECTED Final   Streptococcus species NOT DETECTED NOT DETECTED Final   Streptococcus agalactiae NOT DETECTED  NOT DETECTED Final   Streptococcus pneumoniae NOT DETECTED NOT DETECTED Final   Streptococcus pyogenes NOT DETECTED NOT DETECTED Final   A.calcoaceticus-baumannii NOT DETECTED NOT DETECTED Final   Bacteroides fragilis NOT DETECTED NOT DETECTED Final   Enterobacterales DETECTED (A) NOT DETECTED Final    Comment: Enterobacterales represent a large order of gram negative bacteria, not a single organism. CRITICAL RESULT CALLED TO, READ BACK BY AND VERIFIED WITH: KRISTIAN MERRIL 09/28/22 0745 MW    Enterobacter cloacae complex NOT DETECTED NOT DETECTED Final   Escherichia coli DETECTED (A) NOT DETECTED Final    Comment: CRITICAL RESULT CALLED TO, READ BACK BY AND VERIFIED WITH: KRISTIAN MERRIL 09/28/22 0745 MW    Klebsiella aerogenes NOT DETECTED NOT DETECTED Final   Klebsiella oxytoca NOT DETECTED NOT DETECTED Final   Klebsiella pneumoniae NOT DETECTED NOT DETECTED Final   Proteus species NOT DETECTED NOT DETECTED Final   Salmonella species NOT DETECTED NOT DETECTED Final   Serratia marcescens NOT DETECTED NOT DETECTED Final   Haemophilus influenzae NOT DETECTED NOT DETECTED Final   Neisseria meningitidis NOT DETECTED NOT DETECTED Final   Pseudomonas aeruginosa NOT DETECTED NOT DETECTED Final   Stenotrophomonas maltophilia NOT DETECTED NOT DETECTED Final   Candida albicans NOT DETECTED NOT DETECTED Final   Candida auris NOT DETECTED NOT DETECTED Final   Candida glabrata NOT DETECTED NOT DETECTED Final   Candida krusei NOT DETECTED NOT DETECTED Final   Candida parapsilosis NOT DETECTED NOT DETECTED Final   Candida tropicalis NOT DETECTED NOT DETECTED Final   Cryptococcus neoformans/gattii NOT DETECTED NOT DETECTED  Final   CTX-M ESBL NOT DETECTED NOT DETECTED Final   Carbapenem resistance IMP NOT DETECTED NOT DETECTED Final   Carbapenem resistance KPC NOT DETECTED NOT DETECTED Final   Carbapenem resistance NDM NOT DETECTED NOT DETECTED Final   Carbapenem resist OXA 48 LIKE NOT DETECTED NOT DETECTED Final   Carbapenem resistance VIM NOT DETECTED NOT DETECTED Final    Comment: Performed at [facility], [address] [place], [place] [address]  Resp Panel by RT-PCR (Flu A&B, Covid) Anterior Nasal Swab     Status: None   Collection Time: 09/27/22  7:54 PM   Specimen: Anterior Nasal Swab  Result Value Ref Range Status   SARS Coronavirus 2 by RT PCR NEGATIVE NEGATIVE Final    Comment: (NOTE) SARS-CoV-2 target nucleic acids are NOT DETECTED.  The SARS-CoV-2 RNA is generally detectable in upper respiratory specimens during the acute phase of infection. The lowest concentration of SARS-CoV-2 viral copies this assay can detect is 138 copies/mL. A negative result does not preclude SARS-Cov-2 infection and should not be used as the sole basis for treatment or other patient management decisions. A negative result may occur with  improper specimen collection/handling, submission of specimen other than nasopharyngeal swab, presence of viral mutation(s) within the areas targeted by this assay, and inadequate number of viral copies(<138 copies/mL). A negative result must be combined with clinical observations, patient history, and epidemiological information. The expected result is Negative.  Fact Sheet for Patients:  EntrepreneurPulse.com.au  Fact Sheet for Healthcare Providers:  IncredibleEmployment.be  This test is no t yet approved or cleared by the [place] FDA and  has been authorized for detection and/or diagnosis of SARS-CoV-2 by FDA under an Emergency Use Authorization (EUA). This EUA will remain  in effect (meaning this test can be used) for  the duration of the COVID-19 declaration under Section 564(b)(1) of the Act, 21 U.S.C.section 360bbb-3(b)(1), unless the authorization  is terminated  or revoked sooner.       Influenza A by PCR NEGATIVE NEGATIVE Final   Influenza B by PCR NEGATIVE NEGATIVE Final    Comment: (NOTE) The Xpert Xpress SARS-CoV-2/FLU/RSV plus assay is intended as an aid in the diagnosis of influenza from Nasopharyngeal swab specimens and should not be used as a sole basis for treatment. Nasal washings and aspirates are unacceptable for Xpert Xpress SARS-CoV-2/FLU/RSV testing.  Fact Sheet for Patients: EntrepreneurPulse.com.au  Fact Sheet for Healthcare Providers: IncredibleEmployment.be  This test is not yet approved or cleared by the [place] FDA and has been authorized for detection and/or diagnosis of SARS-CoV-2 by FDA under an Emergency Use Authorization (EUA). This EUA will remain in effect (meaning this test can be used) for the duration of the COVID-19 declaration under Section 564(b)(1) of the Act, 21 U.S.C. section 360bbb-3(b)(1), unless the authorization is terminated or revoked.  Performed at [facility], [address] [place], [place] [address]   Blood Culture (routine x 2)     Status: Abnormal   Collection Time: 09/27/22  7:54 PM   Specimen: BLOOD  Result Value Ref Range Status   Specimen Description   Final    BLOOD RIGHT FA Performed at [facility], [address] [place], [place] [address]    Special Requests   Final    BOTTLES DRAWN AEROBIC AND ANAEROBIC Blood Culture results may not be optimal due to an excessive volume of blood received in culture bottles Performed at [facility], [address] [place], [place] [address]    Culture  Setup Time   Final    GRAM NEGATIVE RODS IN BOTH AEROBIC AND ANAEROBIC BOTTLES CRITICAL RESULT CALLED TO, READ BACK BY AND VERIFIED WITH: Nat Math MERRIL 09/28/22  0745 MW Performed at [facility], [address] [place], [place] [address]    Culture (A)  Final    ESCHERICHIA COLI SUSCEPTIBILITIES PERFORMED ON PREVIOUS CULTURE WITHIN THE LAST 5 DAYS. Performed at [facility], [place] [address], [place], [place] [address]    Report Status 09/30/2022 FINAL  Final  Urine Culture     Status: Abnormal   Collection Time: 09/27/22  9:19 PM   Specimen: In/Out Cath Urine  Result Value Ref Range Status   Specimen Description   Final    IN/OUT CATH URINE Performed at [facility], [address] [place], [place] [address]    Special Requests   Final    NONE Performed at [facility], [place], [address] [place], [place] [address]    Culture (A)  Final    >=100,000 COLONIES/mL ENTEROCOCCUS FAECALIS >=100,000 COLONIES/mL KLEBSIELLA OXYTOCA >=100,000 COLONIES/mL ESCHERICHIA COLI    Report Status 09/30/2022 FINAL  Final   Organism ID, Bacteria ENTEROCOCCUS FAECALIS (A)  Final   Organism ID, Bacteria KLEBSIELLA OXYTOCA (A)  Final   Organism ID, Bacteria ESCHERICHIA COLI (A)  Final      Susceptibility   Escherichia coli - MIC*    AMPICILLIN <=2 SENSITIVE Sensitive     CEFAZOLIN <=4 SENSITIVE Sensitive     CEFEPIME <=0.12 SENSITIVE Sensitive     CEFTRIAXONE <=0.25 SENSITIVE Sensitive     CIPROFLOXACIN <=0.25 SENSITIVE Sensitive     GENTAMICIN <=1 SENSITIVE Sensitive     IMIPENEM <=0.25 SENSITIVE Sensitive     NITROFURANTOIN <=16 SENSITIVE Sensitive     TRIMETH/SULFA <=20 SENSITIVE Sensitive     AMPICILLIN/SULBACTAM <=2 SENSITIVE Sensitive     PIP/TAZO <=  4 SENSITIVE Sensitive     * >=100,000 COLONIES/mL ESCHERICHIA COLI   Enterococcus faecalis - MIC*    AMPICILLIN <=2 SENSITIVE Sensitive     NITROFURANTOIN <=16 SENSITIVE Sensitive     VANCOMYCIN 1 SENSITIVE Sensitive     * >=100,000 COLONIES/mL ENTEROCOCCUS FAECALIS   Klebsiella oxytoca - MIC*    AMPICILLIN >=32 RESISTANT Resistant     CEFAZOLIN 8 SENSITIVE  Sensitive     CEFEPIME <=0.12 SENSITIVE Sensitive     CEFTRIAXONE <=0.25 SENSITIVE Sensitive     CIPROFLOXACIN <=0.25 SENSITIVE Sensitive     GENTAMICIN <=1 SENSITIVE Sensitive     IMIPENEM <=0.25 SENSITIVE Sensitive     NITROFURANTOIN <=16 SENSITIVE Sensitive     TRIMETH/SULFA <=20 SENSITIVE Sensitive     AMPICILLIN/SULBACTAM 8 SENSITIVE Sensitive     PIP/TAZO <=4 SENSITIVE Sensitive     * >=100,000 COLONIES/mL KLEBSIELLA OXYTOCA  MRSA Next Gen by PCR, Nasal     Status: None   Collection Time: 09/28/22  3:00 AM   Specimen: Nasal Mucosa; Nasal Swab  Result Value Ref Range Status   MRSA by PCR Next Gen NOT DETECTED NOT DETECTED Final    Comment: (NOTE) The GeneXpert MRSA Assay (FDA approved for NASAL specimens only), is one component of a comprehensive MRSA colonization surveillance program. It is not intended to diagnose MRSA infection nor to guide or monitor treatment for MRSA infections. Test performance is not FDA approved in patients less than 57 years old. Performed at [facility], [address] [place], [place] [address]   Culture, blood (Routine X 2) w Reflex to ID Panel     Status: None (Preliminary result)   Collection Time: 09/29/22  2:10 PM   Specimen: BLOOD RIGHT HAND  Result Value Ref Range Status   Specimen Description BLOOD RIGHT HAND  Final   Special Requests   Final    BOTTLES DRAWN AEROBIC AND ANAEROBIC Blood Culture adequate volume   Culture   Final    NO GROWTH 4 DAYS Performed at [facility], [address] [place], [place] [address]    Report Status PENDING  Incomplete  Culture, blood (Routine X 2) w Reflex to ID Panel     Status: None (Preliminary result)   Collection Time: 09/29/22  2:15 PM   Specimen: Right Antecubital; Blood  Result Value Ref Range Status   Specimen Description RIGHT ANTECUBITAL  Final   Special Requests   Final    BOTTLES DRAWN AEROBIC AND ANAEROBIC Blood Culture adequate volume   Culture   Final    NO  GROWTH 4 DAYS Performed at [facility], [address] [place],  [address]    Report Status PENDING  Incomplete    Antimicrobials: Anti-infectives (From admission, onward)    Start     Dose/Rate Route Frequency Ordered Stop   10/01/22 1800  Ampicillin-Sulbactam (UNASYN) 3 g in sodium chloride 0.9 % 100 mL IVPB        3 g 200 mL/hr over 30 Minutes Intravenous Every 6 hours 10/01/22 0913 10/11/22 2359   09/30/22 1430  amoxicillin (AMOXIL) capsule 500 mg  Status:  Discontinued        500 mg Oral Every 8 hours 09/30/22 1329 10/01/22 0913   09/28/22 0900  cefTRIAXone (ROCEPHIN) 2 g in sodium chloride 0.9 % 100 mL IVPB  Status:  Discontinued        2 g 200 mL/hr over 30 Minutes Intravenous Every 24 hours 09/28/22 0842 10/01/22  0913   09/28/22 0800  ceFEPIme (MAXIPIME) 2 g in sodium chloride 0.9 % 100 mL IVPB  Status:  Discontinued        2 g 200 mL/hr over 30 Minutes Intravenous Every 12 hours 09/27/22 2314 09/28/22 0842   09/27/22 2015  vancomycin (VANCOREADY) IVPB 1500 mg/300 mL        1,500 mg 150 mL/hr over 120 Minutes Intravenous  Once 09/27/22 2005 09/27/22 2342   09/27/22 2000  ceFEPIme (MAXIPIME) 2 g in sodium chloride 0.9 % 100 mL IVPB        2 g 200 mL/hr over 30 Minutes Intravenous  Once 09/27/22 1955 09/27/22 2048   09/27/22 2000  metroNIDAZOLE (FLAGYL) IVPB 500 mg        500 mg 100 mL/hr over 60 Minutes Intravenous  Once 09/27/22 1955 09/27/22 2142   09/27/22 2000  vancomycin (VANCOCIN) IVPB 1000 mg/200 mL premix  Status:  Discontinued        1,000 mg 200 mL/hr over 60 Minutes Intravenous  Once 09/27/22 1955 09/27/22 2005      Culture/Microbiology    Component Value Date/Time   SDES RIGHT ANTECUBITAL 09/29/2022 1415   SPECREQUEST  09/29/2022 1415    BOTTLES DRAWN AEROBIC AND ANAEROBIC Blood Culture adequate volume   CULT  09/29/2022 1415    NO GROWTH 4 DAYS Performed at [facility], [address] [place], [place] [address]     REPTSTATUS PENDING 09/29/2022 1415  Radiology Studies: US Abdomen Limited RUQ (LIVER/GB)  Result Date: 10/02/2022 CLINICAL DATA:  Hepatic cirrhosis, diabetes mellitus, hypertension EXAM: ULTRASOUND ABDOMEN LIMITED RIGHT UPPER QUADRANT COMPARISON:  02/23/2022 FINDINGS: Gallbladder: Dependent sludge and scattered echogenic shadowing foci consistent with gallstones in gallbladder. No gallbladder wall thickening, pericholecystic fluid, or sonographic Murphy sign. Common bile duct: Diameter: 5 mm, normal Liver: Echogenic parenchyma, likely fatty infiltration though this can be seen with cirrhosis and certain infiltrative disorders. No definite hepatic mass or nodularity identified though assessment of intrahepatic detail is limited by sound attenuation. Portal vein is patent on color Doppler imaging with normal direction of blood flow towards the liver. Other: No RIGHT upper quadrant free fluid. IMPRESSION: Sludge and small shadowing calculi within gallbladder without evidence of acute cholecystitis or biliary obstruction. Question fatty infiltration of liver versus cirrhosis as above. Electronically Signed   By: Lavonia Dana M.D.   On: 10/02/2022 10:10     LOS: 6 days   Antonieta Pert, MD [facility]  10/03/2022, 12:55 PM

## 2022-10-03 NOTE — Progress Notes (Addendum)
SLP F/U Note  Patient Details Name: Roger [name] MRN: [id number] DOB: 11-08-1956   Note:       Reason Eval/Treat Not Completed:  (chart reviewed; f/u w/ pt/Sitter/NSG) Pt awake this morning, more Alert than yesterday at BSE. Pt verbal and oriented to name, Son's name([name]). Speech was a bit tangential(ie asking the Sitter to show him where she lived), but he was able to follow through w/ basic commands and self-drinking tasks given verbal/visual cues(NO overt s/s of aspiration occurred during drinking liquids while in room). Much improved Alertness vs yesterday which increases safety w/ oral intake.  Sitter stated NSG recently gave medication to manage pain; pt appeared relaxed and voiced no pain during this visit.  Recommend continue current mech soft diet d/t Edentulous status Worked w/ pt to identify some foods for Lunch meal -- noted pt was NOT interested in foods offered(hot or cold items) stating to the Sitter "nothing sounds good". Pt has underlying Chronic medical issues/illness. Recommend f/u by Dietician.  No further ST services indicated. Sitter stated he has been tolerating sips of liquids this morning w/out difficulty. Lunch meal ordered.  ST services can be reconsulted if new needs arise during this admit. NSG updated.      Orinda Kenner, Robinette, CCC-SLP Speech Language Pathologist Rehab Services; [place] [phone] (ascom) Aniston Christman 10/03/2022, 11:16 AM

## 2022-10-03 NOTE — TOC Progression Note (Signed)
Transition of Care [facility]) - Progression Note    Patient Details  Name: Roger [name] MRN: [id number] Date of Birth: 1956-01-04  [facility]) CM/SW Contact  Beverly Sessions, RN Phone Number: 10/03/2022, 1:12 PM  Clinical Narrative:      Bed offers presented to [facility].  He is to let me know his decision by end of day       Expected Discharge Plan and Services                                                 Social Determinants of Health (SDOH) Interventions    Readmission Risk Interventions     No data to display

## 2022-10-03 NOTE — Progress Notes (Signed)
Physical Therapy Treatment Patient Details Name: Roger [name] MRN: [id number] DOB: March 10, 1956 Today's Date: 10/03/2022   History of Present Illness Pt is a 66 year old male presenting to the ED on 11/2 with  confusion and disorientation, admitted with severe sepsis secondary to UTI, acute metabolic encephalopathy, AKI, acute gouty arthorpathy; PMH significant for rostate cancer with bone metastases, hypertension, diabetes mellitus and gout    PT Comments    Patient is in agreement for PT session. Activity tolerance is limited by pain in bilateral knees and feet today, with increased pain with movement. Total assistance was required for bed mobility. He requested multiple times to attempt standing with decreased awareness of limitations. He does not initiate standing despite cues and has difficulty just with anterior weight shifting and repositioning of BLE in preparation for standing. Recommend to continue PT to maximize independence and decrease caregiver burden. SNF is recommended at discharge.    Recommendations for follow up therapy are one component of a multi-disciplinary discharge planning process, led by the attending physician.  Recommendations may be updated based on patient status, additional functional criteria and insurance authorization.  Follow Up Recommendations  Skilled nursing-short term rehab (<3 hours/day) Can patient physically be transported by private vehicle: No   Assistance Recommended at Discharge Frequent or constant Supervision/Assistance  Patient can return home with the following Two people to help with walking and/or transfers;Two people to help with bathing/dressing/bathroom   Equipment Recommendations   (to be determined at next level of care)    Recommendations for Other Services       Precautions / Restrictions Precautions Precautions: Fall Restrictions Weight Bearing Restrictions: No     Mobility  Bed Mobility Overal bed mobility: Needs  Assistance Bed Mobility: Rolling, Supine to Sit, Sit to Supine Rolling: Max assist   Supine to sit: Total assist, +2 for physical assistance Sit to supine: Total assist, +2 for physical assistance   General bed mobility comments: limited by pain and impaired cognition    Transfers Overall transfer level: Needs assistance                 General transfer comment: patient requesting to stand multiple times but does not initiate standing with cues. he has difficulty with anterior weight shifting or repositioning of BLE in preparation for standing secondary to pain in knees and feet with any movement. recommend hoyer lift when patient is better controlled for routine OOB mobility with nursing staff    Ambulation/Gait                   Stairs             Wheelchair Mobility    Modified Rankin (Stroke Patients Only)       Balance Overall balance assessment: Needs assistance Sitting-balance support: No upper extremity supported, Feet supported Sitting balance-Leahy Scale: Fair                                      Cognition Arousal/Alertness: Awake/alert Behavior During Therapy: Flat affect Overall Cognitive Status: No family/caregiver present to determine baseline cognitive functioning                                 General Comments: patient required multi-modal cues to complete tasks, increased time  for sequencing. eyes closed at times, patient requesting for  therapist to hold his hand        Exercises      General Comments        Pertinent Vitals/Pain      Home Living                          Prior Function            PT Goals (current goals can now be found in the care plan section) Acute Rehab PT Goals Patient Stated Goal: decreased pain PT Goal Formulation: With patient Time For Goal Achievement: 10/15/22 Potential to Achieve Goals: Fair Progress towards PT goals: Progressing toward  goals    Frequency    Min 2X/week      PT Plan Current plan remains appropriate    Co-evaluation PT/OT/SLP Co-Evaluation/Treatment: Yes Reason for Co-Treatment: For patient/therapist safety;To address functional/ADL transfers;Necessary to address cognition/behavior during functional activity PT goals addressed during session: Mobility/safety with mobility OT goals addressed during session: ADL's and self-care      AM-PAC PT "6 Clicks" Mobility   Outcome Measure  Help needed turning from your back to your side while in a flat bed without using bedrails?: Total Help needed moving from lying on your back to sitting on the side of a flat bed without using bedrails?: Total Help needed moving to and from a bed to a chair (including a wheelchair)?: Total Help needed standing up from a chair using your arms (e.g., wheelchair or bedside chair)?: Total Help needed to walk in hospital room?: Total Help needed climbing 3-5 steps with a railing? : Total 6 Click Score: 6    End of Session   Activity Tolerance: Patient limited by pain Patient left: in bed;with call bell/phone within reach;with bed alarm set;with nursing/sitter in room (nurse and sitter in the room) Nurse Communication: Patient requests pain meds;Mobility status PT Visit Diagnosis: Muscle weakness (generalized) (M62.81);Difficulty in walking, not elsewhere classified (R26.2)     Time: 6767-2094 PT Time Calculation (min) (ACUTE ONLY): 28 min  Charges:  $Therapeutic Activity: 8-22 mins                     Minna Merritts, PT, MPT    Percell Locus 10/03/2022, 11:26 AM

## 2022-10-03 NOTE — Telephone Encounter (Signed)
Pt currently admitted. Will check with MD for post hospital follow up instructions.

## 2022-10-03 NOTE — Progress Notes (Signed)
Occupational Therapy Treatment Roger [name] Name: Roger [name] MRN: [id number] DOB: [birth year] Today's Date: 10/03/2022   History of present illness Pt is a 66 year old male presenting to the ED on 11/2 with  confusion and disorientation, admitted with severe sepsis secondary to UTI, acute metabolic encephalopathy, AKI, acute gouty arthorpathy; PMH significant for rostate cancer with bone metastases, hypertension, diabetes mellitus and gout   OT comments  Roger [name] was seen for OT/PT co-treatment on this date. Pt oriented to self, place, and situation - answers questions. Upon arrival to room pt reclined in bed, agreeable to tx. Pt significantly limited by 10/10 pain, RN in at end of session to address. Pt requires TOTAL A x2 for bed mobility. MIN A + cues self drinking seated EOB, increasing to MOD A + step by step cues at bed level (suspect limited by increased pain). Poor insight into deficits - pt requests to trial standing despite TOTAL A and poor tolerance for anterior shifting. Pt making progress toward goals, will continue to follow POC. Discharge recommendation remains appropriate.     Recommendations for follow up therapy are one component of a multi-disciplinary discharge planning process, led by the attending physician.  Recommendations may be updated based on Roger status, additional functional criteria and insurance authorization.    Follow Up Recommendations  Skilled nursing-short term rehab (<3 hours/day)    Assistance Recommended at Discharge Frequent or constant Supervision/Assistance  Roger can return home with the following  Two people to help with walking and/or transfers;Two people to help with bathing/dressing/bathroom   Equipment Recommendations  Other (comment) (defer)    Recommendations for Other Services      Precautions / Restrictions Precautions Precautions: Fall Restrictions Weight Bearing Restrictions: No       Mobility Bed Mobility Overal  bed mobility: Needs Assistance Bed Mobility: Rolling, Supine to Sit, Sit to Supine Rolling: Max assist   Supine to sit: Total assist, +2 for physical assistance Sit to supine: Total assist, +2 for physical assistance        Transfers                   General transfer comment: pt requests to attempt stasnding however unable to initiate anterior shifting     Balance Overall balance assessment: Needs assistance Sitting-balance support: No upper extremity supported, Feet supported Sitting balance-Leahy Scale: Fair                                     ADL either performed or assessed with clinical judgement   ADL Overall ADL's : Needs assistance/impaired                                       General ADL Comments: MIN A + cues self drinking at bed level. TOTAL A for simulated bed level toileting.       Cognition Arousal/Alertness: Awake/alert Behavior During Therapy: Flat affect Overall Cognitive Status: No family/caregiver present to determine baseline cognitive functioning                                 General Comments: Requires cues to sequence tasks. Poor insight into deficits (requests to trial standing despite TOTAL A)  Pertinent Vitals/ Pain       Pain Assessment Pain Assessment: 0-10 Pain Score: 10-Worst pain ever Pain Location: B knees and feet Pain Descriptors / Indicators: Aching, Guarding, Grimacing, Moaning Pain Intervention(s): Limited activity within Roger's tolerance, Repositioned, RN gave pain meds during session, Roger requesting pain meds-RN notified   Frequency  Min 2X/week        Progress Toward Goals  OT Goals(current goals can now be found in the care plan section)  Progress towards OT goals: Progressing toward goals  Acute Rehab OT Goals Roger Stated Goal: to walk OT Goal Formulation: With Roger Time For Goal Achievement: 10/17/22 Potential to Achieve  Goals: Fair ADL Goals Pt Will Perform Grooming: with min assist;sitting Pt Will Perform Lower Body Dressing: with mod assist Pt Will Transfer to Toilet: with min assist Pt Will Perform Toileting - Clothing Manipulation and hygiene: with mod assist  Plan      Co-evaluation    PT/OT/SLP Co-Evaluation/Treatment: Yes Reason for Co-Treatment: For Roger/therapist safety;To address functional/ADL transfers;Necessary to address cognition/behavior during functional activity PT goals addressed during session: Mobility/safety with mobility OT goals addressed during session: ADL's and self-care      AM-PAC OT "6 Clicks" Daily Activity     Outcome Measure   Help from another person eating meals?: A Lot Help from another person taking care of personal grooming?: A Lot Help from another person toileting, which includes using toliet, bedpan, or urinal?: Total Help from another person bathing (including washing, rinsing, drying)?: Total Help from another person to put on and taking off regular upper body clothing?: A Lot Help from another person to put on and taking off regular lower body clothing?: Total 6 Click Score: 9    End of Session    OT Visit Diagnosis: Muscle weakness (generalized) (M62.81);Other abnormalities of gait and mobility (R26.89)   Activity Tolerance Roger limited by pain   Roger Left in bed;with call bell/phone within reach;with bed alarm set;with nursing/sitter in room   Nurse Communication Mobility status;Roger requests pain meds        Time: 2505-3976 OT Time Calculation (min): 31 min  Charges: OT General Charges $OT Visit: 1 Visit OT Treatments $Self Care/Home Management : 8-22 mins  Dessie Coma, M.S. OTR/L  10/03/22, 9:45 AM  ascom [phone]

## 2022-10-03 NOTE — Telephone Encounter (Signed)
Insurance has denied [employer].  How would you like to proceed?

## 2022-10-04 DIAGNOSIS — N390 Urinary tract infection, site not specified: Secondary | ICD-10-CM | POA: Diagnosis not present

## 2022-10-04 DIAGNOSIS — A419 Sepsis, unspecified organism: Secondary | ICD-10-CM | POA: Diagnosis not present

## 2022-10-04 LAB — COMPREHENSIVE METABOLIC PANEL
ALT: 12 U/L (ref 0–44)
AST: 18 U/L (ref 15–41)
Albumin: 2 g/dL — ABNORMAL LOW (ref 3.5–5.0)
Alkaline Phosphatase: 157 U/L — ABNORMAL HIGH (ref 38–126)
Anion gap: 4 — ABNORMAL LOW (ref 5–15)
BUN: 40 mg/dL — ABNORMAL HIGH (ref 8–23)
CO2: 24 mmol/L (ref 22–32)
Calcium: 7.9 mg/dL — ABNORMAL LOW (ref 8.9–10.3)
Chloride: 109 mmol/L (ref 98–111)
Creatinine, Ser: 1.34 mg/dL — ABNORMAL HIGH (ref 0.61–1.24)
GFR, Estimated: 59 mL/min — ABNORMAL LOW (ref >60)
Glucose, Bld: 81 mg/dL (ref 70–99)
Potassium: 4.1 mmol/L (ref 3.5–5.1)
Sodium: 137 mmol/L (ref 135–145)
Total Bilirubin: 0.8 mg/dL (ref 0.3–1.2)
Total Protein: 5.5 g/dL — ABNORMAL LOW (ref 6.5–8.1)

## 2022-10-04 LAB — CULTURE, BLOOD (ROUTINE X 2)
Culture: NO GROWTH
Culture: NO GROWTH
Special Requests: ADEQUATE
Special Requests: ADEQUATE

## 2022-10-04 LAB — ABO/RH: ABO/RH(D): O POS

## 2022-10-04 LAB — MAGNESIUM: Magnesium: 1.7 mg/dL (ref 1.7–2.4)

## 2022-10-04 LAB — CBC
HCT: 20.7 % — ABNORMAL LOW (ref 39.0–52.0)
Hemoglobin: 6.9 g/dL — ABNORMAL LOW (ref 13.0–17.0)
MCH: 33.2 pg (ref 26.0–34.0)
MCHC: 33.3 g/dL (ref 30.0–36.0)
MCV: 99.5 fL (ref 80.0–100.0)
Platelets: 127 10*3/uL — ABNORMAL LOW (ref 150–400)
RBC: 2.08 MIL/uL — ABNORMAL LOW (ref 4.22–5.81)
RDW: 13.7 % (ref 11.5–15.5)
WBC: 14.6 10*3/uL — ABNORMAL HIGH (ref 4.0–10.5)
nRBC: 0.1 % (ref 0.0–0.2)

## 2022-10-04 LAB — PREPARE RBC (CROSSMATCH)

## 2022-10-04 LAB — GLUCOSE, CAPILLARY
Glucose-Capillary: 79 mg/dL (ref 70–99)
Glucose-Capillary: 133 mg/dL — ABNORMAL HIGH (ref 70–99)
Glucose-Capillary: 162 mg/dL — ABNORMAL HIGH (ref 70–99)
Glucose-Capillary: 184 mg/dL — ABNORMAL HIGH (ref 70–99)
Glucose-Capillary: 127 mg/dL — ABNORMAL HIGH (ref 70–99)

## 2022-10-04 LAB — HEMOGLOBIN AND HEMATOCRIT, BLOOD
HCT: 23.9 % — ABNORMAL LOW (ref 39.0–52.0)
Hemoglobin: 8 g/dL — ABNORMAL LOW (ref 13.0–17.0)

## 2022-10-04 MED ORDER — MUSCLE RUB 10-15 % EX CREA
1.0000 | TOPICAL_CREAM | CUTANEOUS | Status: DC | PRN
  Administered 2022-10-04 (×2): 1 via TOPICAL
  Filled 2022-10-04: qty 85

## 2022-10-04 MED ORDER — INSULIN ASPART 100 UNIT/ML IJ SOLN
0.0000 [IU] | Freq: Three times a day (TID) | INTRAMUSCULAR | Status: DC
  Administered 2022-10-05 (×2): 3 [IU] via SUBCUTANEOUS
  Administered 2022-10-06: 1 [IU] via SUBCUTANEOUS
  Administered 2022-10-06: 2 [IU] via SUBCUTANEOUS
  Filled 2022-10-04 (×4): qty 1

## 2022-10-04 MED ORDER — INSULIN ASPART 100 UNIT/ML IJ SOLN: 0.0000 [IU] | Freq: Three times a day (TID) | INTRAMUSCULAR | Status: DC

## 2022-10-04 MED ORDER — SODIUM CHLORIDE 0.9% IV SOLUTION: Freq: Once | INTRAVENOUS | Status: AC

## 2022-10-04 NOTE — Telephone Encounter (Signed)
Appeal letter sent 

## 2022-10-04 NOTE — TOC Progression Note (Signed)
Transition of Care [facility]) - Progression Note    Patient Details  Name: Roger [name] MRN: [id number] Date of Birth: Feb 25, 1956  Transition of Care [facility]) CM/SW Contact  Beverly Sessions, RN Phone Number: 10/04/2022, 10:24 AM  Clinical Narrative:     Received return call from son Lyla Glassing. He has discussed with family and would like to accept bed at [facility] : [facility]       Expected Discharge Plan and Services                                                 Social Determinants of Health (SDOH) Interventions    Readmission Risk Interventions     No data to display

## 2022-10-04 NOTE — TOC Progression Note (Signed)
Transition of Care [facility]) - Progression Note    Patient Details  Name: Roger [name] MRN: [id number] Date of Birth: 1956/03/25  Transition of Care [facility]) CM/SW Contact  Beverly Sessions, RN Phone Number: 10/04/2022, 10:25 AM  Clinical Narrative:     Accepted [facility] in the [place].  Notified Shirlee Limerick at [facility] and she states she will have Central Intake start insurance auth today        Expected Discharge Plan and Services                                                 Social Determinants of Health (SDOH) Interventions    Readmission Risk Interventions     No data to display

## 2022-10-04 NOTE — Progress Notes (Signed)
PROGRESS NOTE REQUAN HARDGE  FVC:[id number] DOB: 05-Aug-1956 DOA: 09/27/2022 PCP: Lahoma Rocker, MD   Brief Narrative/Hospital Course: 66-yom w/ recent diagnosis of prostate cancer with bone metastases, hypertension, diabetes mellitus and gout who has been noncompliant with medications and presented to the emergency room on 11/2 for confusion and disorientation.   In ED: found to have severe sepsis felt to be secondary to UTI from indwelling Foley catheter.  Patient started on IV fluids and antibiotics and placed in stepdown unit.  11/3, patient more alert and appropriate and transferred to medical floor. Blood cultures growing out E. coli.  Urine culture also positive for E. Coli, Klebsiella oxytoca, Enterococcus faecalis.  Patient initially treated with Rocephin>changed to Unasyn. Patient had a urinary retention secondary to blocked urinary catheter 11/4, flushed, resolved.  Patient also developed significant agitation on 11/4 and with his heavy alcohol use history- started CIWA protocol.  Psychiatry was consulte    Subjective:  Seen and examined Much less painful lt leg- some on lt knee more alert awake and orineted to self place, month year- able to say president with prompt Sitter at bedside Urine output  2327 cc  Assessment and Plan: Principal Problem:   Severe sepsis secondary to UTI [facility]) Active Problems:   Acute metabolic encephalopathy   Acute gouty arthropathy   AKI (acute kidney injury) ([place])   Indwelling Foley catheter present   Prostate cancer metastatic to bone ([place])   Anemia   Essential hypertension   Diabetes mellitus without complication ([place])   Overweight (BMI 25.0-29.9)   Thrombocytopenia ([place])   Septicemia due to E. coli ([facility])   Alcohol abuse   Delirium tremens ([place])  Severe sepsis with E. coli bacteremia due to UTI: UTI due to E. Coli: Sepsis POA in the setting of E. coli UTI.  Blood culture with E. coli, urine culture also showed Klebsiella oxytocin  Enterococcus faecalis, e coli> antibiotics changed to Unasyn 11/6 per  C/S.  Hemodynamically stable overnight slight bump in WBC count-but no fever.  Continue to monitor. Recent Labs  Lab 09/27/22 1953 09/27/22 1954 09/27/22 2320 09/28/22 0609 09/29/22 0458 09/30/22 0448 10/01/22 0520 10/03/22 0621 10/04/22 0347  WBC  --    < >  --    < > 6.2 12.0* 4.9 11.5* 14.6*  LATICACIDVEN 1.4  --  1.3  --   --   --   --   --   --   PROCALCITON  --   --   --   --  38.47  --   --   --   --    < > = values in this interval not displayed.    Prostate cancer with mets to bone followed by oncology outpatient  Acute metabolic encephalopathy Delirium tremens: Patient had confusion/encephalopathy due to sepsis and alcohol withdrawal.  Mental status significantly better this morning.amily endorsed he is a heavy drinker with liquor and beer.  Managed w/ CIWA Ativan, continue to provide supportive care, delirium precaution PT OT and will need placement.  Alcohol abuse: Ultrasound showing steatohepatitis versus liver cirrhosis, continue thiamine, folate multivitamins.  Continue to encourage liquid liver disease cessation.  Acute kidney injury: Peaked to 2.2 but now improved to 1.3, encourage oral hydration, cut back on ivf  Recent Labs    04/18/22 0928 09/08/22 0826 09/27/22 1954 09/28/22 0609 09/29/22 0458 09/30/22 0448 10/01/22 0520 10/02/22 0608 10/03/22 0621 10/04/22 0347  BUN 24* 15 51* 54* 59* 72* 79* 71* 54* 40*  CREATININE 1.32*  1.00 1.85* 2.10* 2.04* 2.55* 2.25* 1.93* 1.56* 1.34*   Urine retention needing Foley catheter, TOV once more mobile  Acute gout arthropathy with swelling of his knees x-ray no acute finding with a small amount of joint effusion.  He is on prednisone, allopurinol.  Unable to use colchicine due to his elevated renal function.  Continue oxycodone Tylenol for pain control.  Continue PT OT  Thrombocytopenia.  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 Plt are Uptrending.  We will hold Lovenox in the setting of acute drop in hemoglobin Recent Labs  Lab 09/29/22 0458 09/30/22 0448 10/01/22 0520 10/03/22 0621 10/04/22 0347  PLT 67* 100* 67* 102* 127*   Anemia: Multifactorial with chronic disease and folate deficiency:had a acute drop of hemoglobin to 7.4, but quickly bounced back.Seen by GI, no plan for EGD.  Continue folate supplementation trend H&H as below.  Since hemoglobin dropped to 6.9P: Patient and son agreeable for prbc 1 unit after explaining risk and benefits.   Recent Labs    09/24/22 1401 09/27/22 1954 09/29/22 0458 09/30/22 0447 09/30/22 0448 10/01/22 1804 10/02/22 0148 10/02/22 0946 10/03/22 0621 10/04/22 0347  HGB  --    < > 9.6*  --    < > 8.9* 9.7* 8.8* 7.5* 6.9*  MCV  --    < > 99.3  --    < >  --   --   --  99.1 99.5  VITAMINB12 475  --   --   --   --   --   --   --   --   --   FOLATE 4.3*  --   --   --   --   --   --   --   --   --   FERRITIN  --   --   --  3,489*  --   --   --   --   --   --   TIBC  --   --   --  147*  --   --   --   --   --   --   IRON  --   --   --  69  --   --   --   --   --   --   RETICCTPCT  --   --  0.9  --   --   --   --   --   --   --    < > = values in this interval not displayed.     Essential hypertension BP  controlled on Me a fall toprolol, Imdur HLD continue statin Diabetes mellitus type 2, A1c 6.5, blood sugar fairly controlled monitor on SSI as below Recent Labs  Lab 09/27/22 1954 09/27/22 2332 10/03/22 1608 10/03/22 1933 10/03/22 2349 10/04/22 0405 10/04/22 0811  GLUCAP  --    < > 206* 191* 191* 79 133*  HGBA1C 6.5*  --   --   --   --   --   --    < > = values in this interval not displayed.     Class I Obesity:Patient's Body mass index is 31.81 kg/m. : Will benefit with PCP follow-up, weight loss  healthy lifestyle and outpatient sleep evaluation.   DVT prophylaxis: enoxaparin (LOVENOX) injection 40 mg Start: 09/28/22 0800 Code  Status:   Code Status: Full Code Family Communication: plan of care discussed with patient/son updated at the  bedside.    Patient status is: Inpatient because of acute encephalopathy sepsis Level of care: Med-Surg  Dispo: The patient is from: home            Anticipated disposition: SNF in 2-3 days  Mobility Assessment (last 72 hours)     Mobility Assessment     Row Name 10/03/22 2000 10/03/22 1100 10/03/22 0928 10/01/22 1402 10/01/22 1256   Does patient have an order for bedrest or is patient medically unstable No - Continue assessment -- -- -- --   What is the highest level of mobility based on the progressive mobility assessment? Level 2 (Chair[place]) - Balance while sitting on edge of bed and cannot stand Level 2 (Chairfast) - Balance while sitting on edge of bed and cannot stand Level 2 (Chairfast) - Balance while sitting on edge of bed and cannot stand Level 2 (Chairfast) - Balance while sitting on edge of bed and cannot stand Level 2 (Chairfast) - Balance while sitting on edge of bed and cannot stand   Is the above level different from baseline mobility prior to current illness? Yes - Recommend PT order -- -- -- --    [place] Name 10/01/22 1106           What is the highest level of mobility based on the progressive mobility assessment? Level 1 (Bedfast) - Unable to balance while sitting on edge of bed                 Objective: Vitals last 24 hrs: Vitals:   10/03/22 1602 10/03/22 1922 10/04/22 0448 10/04/22 0743  BP: 124/78 (!) 150/72 135/82 125/73  Pulse: 87 92 82 90  Resp: '17 20 20 18  '$ Temp: (!) 97.5 F (36.4 C) 99 F (37.2 C) 97.7 F (36.5 C) 98 F (36.7 C)  TempSrc: Oral Oral Oral   SpO2: 100% 98% 100% 100%  Weight:      Height:       Weight change:   Physical Examination: General exam: Aa0 x2-3, weak,older appearing HEENT:Oral mucosa moist, Ear/Nose WNL grossly, dentition normal. Respiratory system: bilaterally clear BS,no use of accessory  muscle Cardiovascular system: S1 & S2 +, regular rate. Gastrointestinal system: Abdomen soft, obese, NT,ND,BS+ Nervous System:Alert, awake, moving extremities and grossly nonfocal Extremities: LE ankle edema mild, tender lt knee with mild swelling, lower extremities warm Skin: No rashes,no icterus. MSK: Normal muscle bulk,tone, power   Medications reviewed:  Scheduled Meds:  allopurinol  200 mg Oral Daily   aspirin EC  81 mg Oral Daily   Chlorhexidine Gluconate Cloth  6 each Topical Daily   enoxaparin (LOVENOX) injection  40 mg Subcutaneous T61W   folic acid  1 mg Oral Daily   insulin aspart  0-15 Units Subcutaneous Q4H   isosorbide mononitrate  30 mg Oral Daily   metoprolol tartrate  25 mg Oral BID   multivitamin with minerals  1 tablet Oral Daily   pantoprazole  40 mg Oral Daily   predniSONE  20 mg Oral Q breakfast   rosuvastatin  10 mg Oral Daily   thiamine  100 mg Oral Daily   Or   thiamine  100 mg Intravenous Daily  Continuous Infusions:  ampicillin-sulbactam (UNASYN) IV 3 g (10/04/22 4315)   lactated ringers 100 mL/hr at 10/04/22 0407     Diet Order             DIET DYS 3 Room service appropriate? Yes with Assist; Fluid consistency: Thin  Diet effective now  Intake/Output Summary (Last 24 hours) at 10/04/2022 0915 Last data filed at 10/04/2022 0750 Gross per 24 hour  Intake 2317.74 ml  Output 1600 ml  Net 717.74 ml    Net IO Since Admission: 4,933.93 mL [10/04/22 0915]  Wt Readings from Last 3 Encounters:  10/01/22 97.7 kg  09/08/22 99.8 kg  03/12/22 98.9 kg     Unresulted Labs (From admission, onward)     Start     Ordered   10/04/22 0500  Creatinine, serum  (enoxaparin (LOVENOX)    CrCl >/= 30 ml/min)  Weekly,   R     Comments: while on enoxaparin therapy    09/27/22 2306   10/04/22 0500  Comprehensive metabolic panel  Daily at 5am,   R     Question:  Specimen collection method  Answer:  Lab=Lab collect   10/03/22 1306   10/04/22  0500  CBC  Daily at 5am,   R     Question:  Specimen collection method  Answer:  Lab=Lab collect   10/03/22 1306          Data Reviewed: I have personally reviewed following labs and imaging studies CBC: Recent Labs  Lab 09/27/22 1954 09/28/22 0609 09/29/22 0458 09/30/22 0448 10/01/22 0520 10/01/22 0940 10/01/22 1804 10/02/22 0148 10/02/22 0946 10/03/22 0621 10/04/22 0347  WBC 9.4 6.1 6.2 12.0* 4.9  --   --   --   --  11.5* 14.6*  NEUTROABS 8.4* 4.9  --   --   --   --   --   --   --   --   --   HGB 9.9* 8.2* 9.6* 10.0* 7.4*   < > 8.9* 9.7* 8.8* 7.5* 6.9*  HCT 30.0* 24.5* 29.0* 30.3* 22.2*  --   --   --   --  22.6* 20.7*  MCV 99.0 100.0 99.3 97.7 97.4  --   --   --   --  99.1 99.5  PLT 109* 64* 67* 100* 67*  --   --   --   --  102* 127*   < > = values in this interval not displayed.    Basic Metabolic Panel: Recent Labs  Lab 09/28/22 0609 09/29/22 0458 09/30/22 0448 10/01/22 0520 10/02/22 0608 10/03/22 0621 10/04/22 0347  NA 136   < > 137 137 139 142 137  K 3.8   < > 4.2 4.0 3.8 3.9 4.1  CL 104   < > 106 108 108 111 109  CO2 24   < > 17* 22 19* 24 24  GLUCOSE 119*   < > 200* 157* 121* 123* 81  BUN 54*   < > 72* 79* 71* 54* 40*  CREATININE 2.10*   < > 2.55* 2.25* 1.93* 1.56* 1.34*  CALCIUM 7.9*   < > 8.4* 8.1* 8.5* 8.3* 7.9*  MG 1.9  --  2.1 2.1 2.1 1.9 1.7  PHOS 4.1  --   --   --  2.6 2.3*  --    < > = values in this interval not displayed.    GFR: Estimated Creatinine Clearance: 64.2 mL/min (A) (by C-G formula based on SCr of 1.34 mg/dL (H)). Liver Function Tests: Recent Labs  Lab 09/27/22 1954 09/28/22 0609 10/04/22 0347  AST '26 29 18  '$ ALT '11 13 12  '$ ALKPHOS 295* 242* 157*  BILITOT 1.4* 1.1 0.8  PROT 6.6 5.7* 5.5*  ALBUMIN 2.2* 2.0* 2.0*   Coagulation Profile: Recent Results (from the past  240 hour(s))  Blood Culture (routine x 2)     Status: Abnormal   Collection Time: 09/27/22  7:53 PM   Specimen: BLOOD  Result Value Ref Range Status    Specimen Description   Final    BLOOD LEFT FA Performed at [facility], [address], [place], [place] [address]    Special Requests   Final    BOTTLES DRAWN AEROBIC AND ANAEROBIC Blood Culture results may not be optimal due to an excessive volume of blood received in culture bottles Performed at [facility], [address], [place], [place] [address]    Culture  Setup Time   Final    GRAM NEGATIVE RODS IN BOTH AEROBIC AND ANAEROBIC BOTTLES CRITICAL RESULT CALLED TO, READ BACK BY AND VERIFIED WITH: Nat Math MERRIL 09/28/22 0745 MW Performed at [facility], [place] [address] [place], [place] [address]    Culture ESCHERICHIA COLI (A)  Final   Report Status 09/30/2022 FINAL  Final   Organism ID, Bacteria ESCHERICHIA COLI  Final      Susceptibility   Escherichia coli - MIC*    AMPICILLIN <=2 SENSITIVE Sensitive     CEFAZOLIN <=4 SENSITIVE Sensitive     CEFEPIME <=0.12 SENSITIVE Sensitive     CEFTAZIDIME <=1 SENSITIVE Sensitive     CEFTRIAXONE <=0.25 SENSITIVE Sensitive     CIPROFLOXACIN <=0.25 SENSITIVE Sensitive     GENTAMICIN <=1 SENSITIVE Sensitive     IMIPENEM <=0.25 SENSITIVE Sensitive     TRIMETH/SULFA <=20 SENSITIVE Sensitive     AMPICILLIN/SULBACTAM <=2 SENSITIVE Sensitive     PIP/TAZO <=4 SENSITIVE Sensitive     * ESCHERICHIA COLI  Blood Culture ID Panel (Reflexed)     Status: Abnormal   Collection Time: 09/27/22  7:53 PM  Result Value Ref Range Status   Enterococcus faecalis NOT DETECTED NOT DETECTED Final   Enterococcus Faecium NOT DETECTED NOT DETECTED Final   Listeria monocytogenes NOT DETECTED NOT DETECTED Final   Staphylococcus species NOT DETECTED NOT DETECTED Final   Staphylococcus aureus (BCID) NOT DETECTED NOT DETECTED Final   Staphylococcus epidermidis NOT DETECTED NOT DETECTED Final   Staphylococcus lugdunensis NOT DETECTED NOT DETECTED Final   Streptococcus species NOT DETECTED NOT DETECTED Final   Streptococcus agalactiae NOT  DETECTED NOT DETECTED Final   Streptococcus pneumoniae NOT DETECTED NOT DETECTED Final   Streptococcus pyogenes NOT DETECTED NOT DETECTED Final   A.calcoaceticus-baumannii NOT DETECTED NOT DETECTED Final   Bacteroides fragilis NOT DETECTED NOT DETECTED Final   Enterobacterales DETECTED (A) NOT DETECTED Final    Comment: Enterobacterales represent a large order of gram negative bacteria, not a single organism. CRITICAL RESULT CALLED TO, READ BACK BY AND VERIFIED WITH: KRISTIAN MERRIL 09/28/22 0745 MW    Enterobacter cloacae complex NOT DETECTED NOT DETECTED Final   Escherichia coli DETECTED (A) NOT DETECTED Final    Comment: CRITICAL RESULT CALLED TO, READ BACK BY AND VERIFIED WITH: KRISTIAN MERRIL 09/28/22 0745 MW    Klebsiella aerogenes NOT DETECTED NOT DETECTED Final   Klebsiella oxytoca NOT DETECTED NOT DETECTED Final   Klebsiella pneumoniae NOT DETECTED NOT DETECTED Final   Proteus species NOT DETECTED NOT DETECTED Final   Salmonella species NOT DETECTED NOT DETECTED Final   Serratia marcescens NOT DETECTED NOT DETECTED Final   Haemophilus influenzae NOT DETECTED NOT DETECTED Final   Neisseria meningitidis NOT DETECTED NOT DETECTED Final   Pseudomonas aeruginosa NOT DETECTED NOT DETECTED Final   Stenotrophomonas maltophilia NOT DETECTED NOT DETECTED Final  Candida albicans NOT DETECTED NOT DETECTED Final   Candida auris NOT DETECTED NOT DETECTED Final   Candida glabrata NOT DETECTED NOT DETECTED Final   Candida krusei NOT DETECTED NOT DETECTED Final   Candida parapsilosis NOT DETECTED NOT DETECTED Final   Candida tropicalis NOT DETECTED NOT DETECTED Final   Cryptococcus neoformans/gattii NOT DETECTED NOT DETECTED Final   CTX-M ESBL NOT DETECTED NOT DETECTED Final   Carbapenem resistance IMP NOT DETECTED NOT DETECTED Final   Carbapenem resistance KPC NOT DETECTED NOT DETECTED Final   Carbapenem resistance NDM NOT DETECTED NOT DETECTED Final   Carbapenem resist OXA 48 LIKE NOT  DETECTED NOT DETECTED Final   Carbapenem resistance VIM NOT DETECTED NOT DETECTED Final    Comment: Performed at [facility], [address] [place], [place] [address]  Resp Panel by RT-PCR (Flu A&B, Covid) Anterior Nasal Swab     Status: None   Collection Time: 09/27/22  7:54 PM   Specimen: Anterior Nasal Swab  Result Value Ref Range Status   SARS Coronavirus 2 by RT PCR NEGATIVE NEGATIVE Final    Comment: (NOTE) SARS-CoV-2 target nucleic acids are NOT DETECTED.  The SARS-CoV-2 RNA is generally detectable in upper respiratory specimens during the acute phase of infection. The lowest concentration of SARS-CoV-2 viral copies this assay can detect is 138 copies/mL. A negative result does not preclude SARS-Cov-2 infection and should not be used as the sole basis for treatment or other patient management decisions. A negative result may occur with  improper specimen collection/handling, submission of specimen other than nasopharyngeal swab, presence of viral mutation(s) within the areas targeted by this assay, and inadequate number of viral copies(<138 copies/mL). A negative result must be combined with clinical observations, patient history, and epidemiological information. The expected result is Negative.  Fact Sheet for Patients:  EntrepreneurPulse.com.au  Fact Sheet for Healthcare Providers:  IncredibleEmployment.be  This test is no t yet approved or cleared by the [place] FDA and  has been authorized for detection and/or diagnosis of SARS-CoV-2 by FDA under an Emergency Use Authorization (EUA). This EUA will remain  in effect (meaning this test can be used) for the duration of the COVID-19 declaration under Section 564(b)(1) of the Act, 21 U.S.C.section 360bbb-3(b)(1), unless the authorization is terminated  or revoked sooner.       Influenza A by PCR NEGATIVE NEGATIVE Final   Influenza B by PCR NEGATIVE NEGATIVE Final     Comment: (NOTE) The Xpert Xpress SARS-CoV-2/FLU/RSV plus assay is intended as an aid in the diagnosis of influenza from Nasopharyngeal swab specimens and should not be used as a sole basis for treatment. Nasal washings and aspirates are unacceptable for Xpert Xpress SARS-CoV-2/FLU/RSV testing.  Fact Sheet for Patients: EntrepreneurPulse.com.au  Fact Sheet for Healthcare Providers: IncredibleEmployment.be  This test is not yet approved or cleared by the [place] FDA and has been authorized for detection and/or diagnosis of SARS-CoV-2 by FDA under an Emergency Use Authorization (EUA). This EUA will remain in effect (meaning this test can be used) for the duration of the COVID-19 declaration under Section 564(b)(1) of the Act, 21 U.S.C. section 360bbb-3(b)(1), unless the authorization is terminated or revoked.  Performed at [facility], [address] [place], [place] [address]   Blood Culture (routine x 2)     Status: Abnormal   Collection Time: 09/27/22  7:54 PM   Specimen: BLOOD  Result Value Ref Range Status   Specimen Description   Final  BLOOD RIGHT FA Performed at [facility], [address] [place], [place] [address]    Special Requests   Final    BOTTLES DRAWN AEROBIC AND ANAEROBIC Blood Culture results may not be optimal due to an excessive volume of blood received in culture bottles Performed at [facility], [address] [place], [place] [address]    Culture  Setup Time   Final    GRAM NEGATIVE RODS IN BOTH AEROBIC AND ANAEROBIC BOTTLES CRITICAL RESULT CALLED TO, READ BACK BY AND VERIFIED WITH: Nat Math MERRIL 09/28/22 0745 MW Performed at [facility], [address] [place], [place] [address]    Culture (A)  Final    ESCHERICHIA COLI SUSCEPTIBILITIES PERFORMED ON PREVIOUS CULTURE WITHIN THE LAST 5 DAYS. Performed at [facility], [place] [address] [place], [place]  [address]    Report Status 09/30/2022 FINAL  Final  Urine Culture     Status: Abnormal   Collection Time: 09/27/22  9:19 PM   Specimen: In/Out Cath Urine  Result Value Ref Range Status   Specimen Description   Final    IN/OUT CATH URINE Performed at [facility], [address] [place], [place] [address]    Special Requests   Final    NONE Performed at [facility], [address], [place], [place] [address]    Culture (A)  Final    >=100,000 COLONIES/mL ENTEROCOCCUS FAECALIS >=100,000 COLONIES/mL KLEBSIELLA OXYTOCA >=100,000 COLONIES/mL ESCHERICHIA COLI    Report Status 09/30/2022 FINAL  Final   Organism ID, Bacteria ENTEROCOCCUS FAECALIS (A)  Final   Organism ID, Bacteria KLEBSIELLA OXYTOCA (A)  Final   Organism ID, Bacteria ESCHERICHIA COLI (A)  Final      Susceptibility   Escherichia coli - MIC*    AMPICILLIN <=2 SENSITIVE Sensitive     CEFAZOLIN <=4 SENSITIVE Sensitive     CEFEPIME <=0.12 SENSITIVE Sensitive     CEFTRIAXONE <=0.25 SENSITIVE Sensitive     CIPROFLOXACIN <=0.25 SENSITIVE Sensitive     GENTAMICIN <=1 SENSITIVE Sensitive     IMIPENEM <=0.25 SENSITIVE Sensitive     NITROFURANTOIN <=16 SENSITIVE Sensitive     TRIMETH/SULFA <=20 SENSITIVE Sensitive     AMPICILLIN/SULBACTAM <=2 SENSITIVE Sensitive     PIP/TAZO <=4 SENSITIVE Sensitive     * >=100,000 COLONIES/mL ESCHERICHIA COLI   Enterococcus faecalis - MIC*    AMPICILLIN <=2 SENSITIVE Sensitive     NITROFURANTOIN <=16 SENSITIVE Sensitive     VANCOMYCIN 1 SENSITIVE Sensitive     * >=100,000 COLONIES/mL ENTEROCOCCUS FAECALIS   Klebsiella oxytoca - MIC*    AMPICILLIN >=32 RESISTANT Resistant     CEFAZOLIN 8 SENSITIVE Sensitive     CEFEPIME <=0.12 SENSITIVE Sensitive     CEFTRIAXONE <=0.25 SENSITIVE Sensitive     CIPROFLOXACIN <=0.25 SENSITIVE Sensitive     GENTAMICIN <=1 SENSITIVE Sensitive     IMIPENEM <=0.25 SENSITIVE Sensitive     NITROFURANTOIN <=16 SENSITIVE Sensitive      TRIMETH/SULFA <=20 SENSITIVE Sensitive     AMPICILLIN/SULBACTAM 8 SENSITIVE Sensitive     PIP/TAZO <=4 SENSITIVE Sensitive     * >=100,000 COLONIES/mL KLEBSIELLA OXYTOCA  MRSA Next Gen by PCR, Nasal     Status: None   Collection Time: 09/28/22  3:00 AM   Specimen: Nasal Mucosa; Nasal Swab  Result Value Ref Range Status   MRSA by PCR Next Gen NOT DETECTED NOT DETECTED Final    Comment: (NOTE) The GeneXpert MRSA Assay (FDA approved  for NASAL specimens only), is one component of a comprehensive MRSA colonization surveillance program. It is not intended to diagnose MRSA infection nor to guide or monitor treatment for MRSA infections. Test performance is not FDA approved in patients less than 57 years old. Performed at [facility], [address] [place], [place] [address]   Culture, blood (Routine X 2) w Reflex to ID Panel     Status: None   Collection Time: 09/29/22  2:10 PM   Specimen: BLOOD RIGHT HAND  Result Value Ref Range Status   Specimen Description BLOOD RIGHT HAND  Final   Special Requests   Final    BOTTLES DRAWN AEROBIC AND ANAEROBIC Blood Culture adequate volume   Culture   Final    NO GROWTH 5 DAYS Performed at [facility], [address] [place], [place] [address]    Report Status 10/04/2022 FINAL  Final  Culture, blood (Routine X 2) w Reflex to ID Panel     Status: None   Collection Time: 09/29/22  2:15 PM   Specimen: Right Antecubital; Blood  Result Value Ref Range Status   Specimen Description RIGHT ANTECUBITAL  Final   Special Requests   Final    BOTTLES DRAWN AEROBIC AND ANAEROBIC Blood Culture adequate volume   Culture   Final    NO GROWTH 5 DAYS Performed at [facility], [address] [place], [place] [address]    Report Status 10/04/2022 FINAL  Final    Antimicrobials: Anti-infectives (From admission, onward)    Start     Dose/Rate Route Frequency Ordered Stop   10/01/22 1800  Ampicillin-Sulbactam (UNASYN) 3 g in  sodium chloride 0.9 % 100 mL IVPB        3 g 200 mL/hr over 30 Minutes Intravenous Every 6 hours 10/01/22 0913 10/11/22 2359   09/30/22 1430  amoxicillin (AMOXIL) capsule 500 mg  Status:  Discontinued        500 mg Oral Every 8 hours 09/30/22 1329 10/01/22 0913   09/28/22 0900  cefTRIAXone (ROCEPHIN) 2 g in sodium chloride 0.9 % 100 mL IVPB  Status:  Discontinued        2 g 200 mL/hr over 30 Minutes Intravenous Every 24 hours 09/28/22 0842 10/01/22 0913   09/28/22 0800  ceFEPIme (MAXIPIME) 2 g in sodium chloride 0.9 % 100 mL IVPB  Status:  Discontinued        2 g 200 mL/hr over 30 Minutes Intravenous Every 12 hours 09/27/22 2314 09/28/22 0842   09/27/22 2015  vancomycin (VANCOREADY) IVPB 1500 mg/300 mL        1,500 mg 150 mL/hr over 120 Minutes Intravenous  Once 09/27/22 2005 09/27/22 2342   09/27/22 2000  ceFEPIme (MAXIPIME) 2 g in sodium chloride 0.9 % 100 mL IVPB        2 g 200 mL/hr over 30 Minutes Intravenous  Once 09/27/22 1955 09/27/22 2048   09/27/22 2000  metroNIDAZOLE (FLAGYL) IVPB 500 mg        500 mg 100 mL/hr over 60 Minutes Intravenous  Once 09/27/22 1955 09/27/22 2142   09/27/22 2000  vancomycin (VANCOCIN) IVPB 1000 mg/200 mL premix  Status:  Discontinued        1,000 mg 200 mL/hr over 60 Minutes Intravenous  Once 09/27/22 1955 09/27/22 2005      Culture/Microbiology    Component Value Date/Time   SDES RIGHT ANTECUBITAL 09/29/2022 1415   SPECREQUEST  09/29/2022 1415    BOTTLES DRAWN  AEROBIC AND ANAEROBIC Blood Culture adequate volume   CULT  09/29/2022 1415    NO GROWTH 5 DAYS Performed at [facility], [address] Madelaine Bhat [place], [place] [address]    REPTSTATUS 10/04/2022 FINAL 09/29/2022 1415  Radiology Studies: No results found.   LOS: 7 days   Antonieta Pert, MD [facility]  10/04/2022, 9:15 AM

## 2022-10-04 NOTE — Progress Notes (Signed)
Pt has PIV infusing 1 UPRBC.  Does not want to have another PIV placed at this time. Once PRBC completed, IV ABT can be infused via this PIV.

## 2022-10-04 NOTE — Telephone Encounter (Signed)
Denied because preferred drug is Eligard.

## 2022-10-05 DIAGNOSIS — N390 Urinary tract infection, site not specified: Secondary | ICD-10-CM | POA: Diagnosis not present

## 2022-10-05 DIAGNOSIS — A419 Sepsis, unspecified organism: Secondary | ICD-10-CM | POA: Diagnosis not present

## 2022-10-05 LAB — CBC
HCT: 24.2 % — ABNORMAL LOW (ref 39.0–52.0)
Hemoglobin: 8.1 g/dL — ABNORMAL LOW (ref 13.0–17.0)
MCH: 32 pg (ref 26.0–34.0)
MCHC: 33.5 g/dL (ref 30.0–36.0)
MCV: 95.7 fL (ref 80.0–100.0)
Platelets: 167 10*3/uL (ref 150–400)
RBC: 2.53 MIL/uL — ABNORMAL LOW (ref 4.22–5.81)
RDW: 17.4 % — ABNORMAL HIGH (ref 11.5–15.5)
WBC: 18.6 10*3/uL — ABNORMAL HIGH (ref 4.0–10.5)
nRBC: 0.1 % (ref 0.0–0.2)

## 2022-10-05 LAB — COMPREHENSIVE METABOLIC PANEL
ALT: 12 U/L (ref 0–44)
AST: 17 U/L (ref 15–41)
Albumin: 2.2 g/dL — ABNORMAL LOW (ref 3.5–5.0)
Alkaline Phosphatase: 196 U/L — ABNORMAL HIGH (ref 38–126)
Anion gap: 4 — ABNORMAL LOW (ref 5–15)
BUN: 26 mg/dL — ABNORMAL HIGH (ref 8–23)
CO2: 24 mmol/L (ref 22–32)
Calcium: 7.8 mg/dL — ABNORMAL LOW (ref 8.9–10.3)
Chloride: 107 mmol/L (ref 98–111)
Creatinine, Ser: 1.23 mg/dL (ref 0.61–1.24)
GFR, Estimated: >60 mL/min (ref >60)
Glucose, Bld: 197 mg/dL — ABNORMAL HIGH (ref 70–99)
Potassium: 4.6 mmol/L (ref 3.5–5.1)
Sodium: 135 mmol/L (ref 135–145)
Total Bilirubin: 0.9 mg/dL (ref 0.3–1.2)
Total Protein: 6.2 g/dL — ABNORMAL LOW (ref 6.5–8.1)

## 2022-10-05 LAB — GLUCOSE, CAPILLARY
Glucose-Capillary: 120 mg/dL — ABNORMAL HIGH (ref 70–99)
Glucose-Capillary: 241 mg/dL — ABNORMAL HIGH (ref 70–99)
Glucose-Capillary: 203 mg/dL — ABNORMAL HIGH (ref 70–99)
Glucose-Capillary: 196 mg/dL — ABNORMAL HIGH (ref 70–99)

## 2022-10-05 LAB — TYPE AND SCREEN
ABO/RH(D): O POS
Antibody Screen: NEGATIVE
Unit division: 0

## 2022-10-05 LAB — BPAM RBC
Blood Product Expiration Date: 202312112359
ISSUE DATE / TIME: 202311091155
Unit Type and Rh: 5100

## 2022-10-05 LAB — URIC ACID: Uric Acid, Serum: 5.8 mg/dL (ref 3.7–8.6)

## 2022-10-05 MED ORDER — POLYETHYLENE GLYCOL 3350 17 G PO PACK: 17.0000 g | PACK | Freq: Every day | ORAL | Status: DC | PRN

## 2022-10-05 MED ORDER — TRAMADOL HCL 50 MG PO TABS
50.0000 mg | ORAL_TABLET | Freq: Once | ORAL | Status: AC
  Administered 2022-10-05: 50 mg via ORAL
  Filled 2022-10-05: qty 1

## 2022-10-05 MED ORDER — METHYLPREDNISOLONE SODIUM SUCC 125 MG IJ SOLR
80.0000 mg | INTRAMUSCULAR | Status: DC
  Administered 2022-10-05 – 2022-10-06 (×2): 80 mg via INTRAVENOUS
  Filled 2022-10-05 (×2): qty 2

## 2022-10-05 NOTE — Progress Notes (Signed)
Physical Therapy Treatment Patient Details Name: Roger [name] MRN: [id number] DOB: Jan 18, 1956 Today's Date: 10/05/2022   History of Present Illness Pt is a 66 year old male presenting to the ED on 11/2 with  confusion and disorientation, admitted with severe sepsis secondary to UTI, acute metabolic encephalopathy, AKI, acute gouty arthorpathy; PMH significant for prostate cancer with bone metastases, hypertension, diabetes mellitus and gout    PT Comments    Patient much more alert and cognitively intact this date; follows commands and agreeable to participation with session.  Remains globally limited by pain in bilat LEs; requiring very slow, deliberate act assist for movement throughout all plains.  Patient initially hopeful for standing efforts, but unable to tolerate WBing throughout LEs.  OOB to chair via lift; tolerates well.  Patient very pleased/excited for OOB positioning; encourage OOB to chair with lift on daily basis.  Nursing, mobility specialists aware of status and mobility needs.    Recommendations for follow up therapy are one component of a multi-disciplinary discharge planning process, led by the attending physician.  Recommendations may be updated based on patient status, additional functional criteria and insurance authorization.  Follow Up Recommendations  Skilled nursing-short term rehab (<3 hours/day) Can patient physically be transported by private vehicle: No   Assistance Recommended at Discharge Frequent or constant Supervision/Assistance  Patient can return home with the following Two people to help with walking and/or transfers;Two people to help with bathing/dressing/bathroom   Equipment Recommendations       Recommendations for Other Services       Precautions / Restrictions Precautions Precautions: Fall Restrictions Weight Bearing Restrictions: No     Mobility  Bed Mobility Overal bed mobility: Needs Assistance Bed Mobility: Supine to Sit      Supine to sit: Max assist, +2 for physical assistance          Transfers Overall transfer level: Needs assistance   Transfers: Bed to chair/wheelchair/BSC             General transfer comment: total assist for bed/chair    Ambulation/Gait               General Gait Details: unable/unsafe   Stairs             Wheelchair Mobility    Modified Rankin (Stroke Patients Only)       Balance Overall balance assessment: Needs assistance Sitting-balance support: No upper extremity supported, Feet supported Sitting balance-Leahy Scale: Good                                      Cognition Arousal/Alertness: Awake/alert Behavior During Therapy: WFL for tasks assessed/performed Overall Cognitive Status: Within Functional Limits for tasks assessed                                 General Comments: much more alert, oriented and actively participatory in session        Exercises Other Exercises Other Exercises: Tolerates very minimal movement (act assist, passive) throughout bilat LEs due to pain in bilat LEs, L > R.  Very slow, deliberate movement during transfer; patient very fearful/guarded with all movement    General Comments        Pertinent Vitals/Pain Pain Assessment Pain Assessment: Faces Faces Pain Scale: Hurts whole lot Pain Location: B knees and feet L>R Pain Descriptors /  Indicators: Aching, Guarding, Grimacing, Moaning Pain Intervention(s): Limited activity within patient's tolerance, Monitored during session, Repositioned, Patient requesting pain meds-RN notified    Home Living                          Prior Function            PT Goals (current goals can now be found in the care plan section) Acute Rehab PT Goals Patient Stated Goal: decreased pain PT Goal Formulation: With patient Time For Goal Achievement: 10/15/22 Potential to Achieve Goals: Fair Progress towards PT goals: Progressing  toward goals    Frequency    Min 2X/week      PT Plan Current plan remains appropriate    Co-evaluation              AM-PAC PT "6 Clicks" Mobility   Outcome Measure  Help needed turning from your back to your side while in a flat bed without using bedrails?: Total Help needed moving from lying on your back to sitting on the side of a flat bed without using bedrails?: Total Help needed moving to and from a bed to a chair (including a wheelchair)?: Total Help needed standing up from a chair using your arms (e.g., wheelchair or bedside chair)?: Total Help needed to walk in hospital room?: Total Help needed climbing 3-5 steps with a railing? : Total 6 Click Score: 6    End of Session   Activity Tolerance: Patient limited by pain Patient left: in chair;with chair alarm set;with call bell/phone within reach Nurse Communication: Mobility status;Need for lift equipment;Patient requests pain meds PT Visit Diagnosis: Muscle weakness (generalized) (M62.81);Difficulty in walking, not elsewhere classified (R26.2)     Time: 0677-0340 PT Time Calculation (min) (ACUTE ONLY): 32 min  Charges:  $Therapeutic Activity: 23-37 mins                     Jaymien Landin H. Owens Shark, PT, DPT, NCS 10/05/22, 1:16 PM [phone]

## 2022-10-05 NOTE — TOC Progression Note (Addendum)
Transition of Care [facility]) - Progression Note    Patient Details  Name: Roger [name] MRN: [id number] Date of Birth: 1956/01/25  Transition of Care [facility]) CM/SW [place], LCSW Phone Number: 10/05/2022, 9:28 AM  Clinical Narrative:    [facility] Assistant checked [employer], auth had not been started. TOC Assistant started Information systems manager for [employer]. Pending cert # is [id number]. CSW left VM for [facility] Admissions to let them know.   12:40- Auth approved in [facility]. Approved 11/10 - 11/16 for sub acute level 1. Review due 11/16. Certification: [id number].   Called Shirlee Limerick in Admissions at [facility] again, she stated patient can come today.  Shirlee Limerick requested [employer] and DC Summary be faxed to [phone] when patient is DC. This CSW faxed auth info today.  Per MD, patient will not be medically ready until possibly over the weekend. Updated Shirlee Limerick who is checking to see if they can take weekend admissions.   1:24Shirlee Limerick at [facility] confirms patient can come over the weekend if medically ready.  Shirlee Limerick stated she will be the contact person and they would need patient to come after 2pm. Updated TOC handoff.          Expected Discharge Plan and Services                                                 Social Determinants of Health (SDOH) Interventions    Readmission Risk Interventions     No data to display

## 2022-10-05 NOTE — Progress Notes (Signed)
Mobility Specialist - Progress Note   10/05/22 1325  Mobility  Activity Transferred from chair to bed  Level of Assistance Total care  Assistive Device [employer] Total  Distance Ambulated (ft) 0 ft  Activity Response Tolerated well  $Mobility charge 1 Mobility     Pt transferred chair-bed via hoyer lift. Painful BLE requiring very slow facilitation of LE to lift/lower against gravity during transfer. MaxA to return supine. Pt left in bed with alarm set, needs in reach.    Kathee Delton Mobility Specialist 10/05/22, 1:31 PM

## 2022-10-05 NOTE — Plan of Care (Signed)
Palliative Medicine consult noted. Due to high referral volume, there may be a delay seeing this patient. Please call the Palliative Medicine Team office at [phone] if recommendations are needed in the interim.   Thank you for inviting [place] to see this patient.

## 2022-10-05 NOTE — Progress Notes (Signed)
Occupational Therapy Treatment Patient Details Name: Roger [name] MRN: [id number] DOB: [birth year] Today's Date: 10/05/2022   History of present illness Pt is a 66 year old male presenting to the ED on 11/2 with  confusion and disorientation, admitted with severe sepsis secondary to UTI, acute metabolic encephalopathy, AKI, acute gouty arthorpathy; PMH significant for prostate cancer with bone metastases, hypertension, diabetes mellitus and gout   OT comments  Chart reviewed, pt greeted in chair agreeable to OT tx session. Tx session targeted improving functional activity tolerance for improved ADL task completion. Improvements noted in cognition specifically one step direction following, orientation, processing however pt continues to present as altered as compared to baseline per report.  Attempted STS, pt limited by pain, cognitive ability to sequence STS with pt unable to lift off of chair with RW. Pt able to participate in BUE strengthening tasks at chair level, grooming tasks with SETUP. Discharge recommendation remains appropriate. OT will continue to follow acutely.    Recommendations for follow up therapy are one component of a multi-disciplinary discharge planning process, led by the attending physician.  Recommendations may be updated based on patient status, additional functional criteria and insurance authorization.    Follow Up Recommendations  Skilled nursing-short term rehab (<3 hours/day)    Assistance Recommended at Discharge Frequent or constant Supervision/Assistance  Patient can return home with the following  Two people to help with walking and/or transfers;Two people to help with bathing/dressing/bathroom   Equipment Recommendations  Other (comment) (per next venue of care)    Recommendations for Other Services      Precautions / Restrictions Precautions Precautions: Fall Restrictions Weight Bearing Restrictions: No       Mobility Bed Mobility                General bed mobility comments: NT in recliner pre/post session    Transfers Overall transfer level: Needs assistance Equipment used: Rolling walker (2 wheels) Transfers: Sit to/from Stand Sit to Stand: Max assist (unable to tolerate due to pain)                 Balance Overall balance assessment: Needs assistance Sitting-balance support: No upper extremity supported, Feet supported Sitting balance-Leahy Scale: Fair     Standing balance support: Bilateral upper extremity supported, No upper extremity supported Standing balance-Leahy Scale: Zero                             ADL either performed or assessed with clinical judgement   ADL Overall ADL's : Needs assistance/impaired     Grooming: Wash/dry face;Sitting;Set up               Lower Body Dressing: Maximal assistance                      Extremity/Trunk Assessment              Vision       Perception     Praxis      Cognition Arousal/Alertness: Awake/alert Behavior During Therapy: Flat affect Overall Cognitive Status: No family/caregiver present to determine baseline cognitive functioning Area of Impairment: Orientation, Attention, Memory, Following commands, Safety/judgement, Awareness, Problem solving                 Orientation Level: Disoriented to, Time Current Attention Level: Sustained Memory: Decreased short-term memory Following Commands: Follows one step commands with increased time Safety/Judgement: Decreased awareness of safety, Decreased  awareness of deficits (pt reporting he is going to amb to bathroom) Awareness: Intellectual Problem Solving: Slow processing, Decreased initiation, Difficulty sequencing, Requires verbal cues, Requires tactile cues          Exercises Other Exercises Other Exercises: chair push ups 10x 1 set, pt encouraged to complete additional sets, pt declining due to fatigue, distracted throughout    Shoulder Instructions        General Comments      Pertinent Vitals/ Pain       Pain Assessment Pain Assessment: 0-10 Pain Score: 10-Worst pain ever Pain Location: B knees and feet L>R Pain Descriptors / Indicators: Aching, Guarding, Grimacing, Moaning Pain Intervention(s): Limited activity within patient's tolerance, Monitored during session, Repositioned  Home Living                                          Prior Functioning/Environment              Frequency  Min 2X/week        Progress Toward Goals  OT Goals(current goals can now be found in the care plan section)  Progress towards OT goals: Progressing toward goals     Plan Discharge plan remains appropriate    Co-evaluation                 AM-PAC OT "6 Clicks" Daily Activity     Outcome Measure   Help from another person eating meals?: A Little Help from another person taking care of personal grooming?: A Little   Help from another person bathing (including washing, rinsing, drying)?: Total Help from another person to put on and taking off regular upper body clothing?: A Lot Help from another person to put on and taking off regular lower body clothing?: Total 6 Click Score: 10    End of Session Equipment Utilized During Treatment: Rolling walker (2 wheels)  OT Visit Diagnosis: Muscle weakness (generalized) (M62.81);Other abnormalities of gait and mobility (R26.89)   Activity Tolerance Patient limited by pain   Patient Left in chair;with call bell/phone within reach;with chair alarm set   Nurse Communication Mobility status        Time: 1610-9604 OT Time Calculation (min): 15 min  Charges: OT General Charges $OT Visit: 1 Visit OT Treatments $Therapeutic Activity: 8-22 mins  Shanon Payor, OTD OTR/L  10/05/22, 11:40 AM

## 2022-10-05 NOTE — Progress Notes (Signed)
PROGRESS NOTE Roger [name]  KPT:[id number] DOB: October 09, 1956 DOA: 09/27/2022 PCP: Lahoma Rocker, MD   Brief Narrative/Hospital Course: 66-yom w/ recent diagnosis of prostate cancer with bone metastases, hypertension, diabetes mellitus and gout who has been noncompliant with medications and presented to the emergency room on 11/2 for confusion and disorientation.   In ED: found to have severe sepsis felt to be secondary to UTI from indwelling Foley catheter.  Patient started on IV fluids and antibiotics and placed in stepdown unit.  11/3, patient more alert and appropriate and transferred to medical floor. Blood cultures growing out E. coli.  Urine culture also positive for E. Coli, Klebsiella oxytoca, Enterococcus faecalis.  Patient initially treated with Rocephin>changed to Unasyn. Patient had a urinary retention secondary to blocked urinary catheter 11/4, flushed, resolved.  Patient also developed significant agitation on 11/4 and with his heavy alcohol use history- started CIWA protocol.  Psychiatry was consulted He had drop in hemoglobin 1 unit PRBC transfused 11/9 overall leg pain knee pain improving and mental status much better alert awake on 11/9    Subjective: Seen and examined. Patient resting this morning earlier was complaining of severe pain on his leg Overnight afebrile Much more alert awake conversant interactive.  Assessment and Plan: Principal Problem:   Severe sepsis secondary to UTI [facility]) Active Problems:   Acute metabolic encephalopathy   Acute gouty arthropathy   AKI (acute kidney injury) ([place])   Indwelling Foley catheter present   Prostate cancer metastatic to bone ([place])   Anemia   Essential hypertension   Diabetes mellitus without complication (HCC)   Overweight (BMI 25.0-29.9)   Thrombocytopenia ([place])   Septicemia due to E. coli (HCC)   Alcohol abuse   Delirium tremens ([employer])  Severe sepsis with E. coli bacteremia due to UTI: UTI due to E. Coli: Sepsis POA  in the setting of E. coli UTI.  Blood culture with E. coli, urine culture also showed Klebsiella oxytocin Enterococcus faecalis, e coli> antibiotics changed to Unasyn 11/6 per  C/S.  Hemodynamically stable overnight shaving bump in WBC counts but also on steroid for gout that could could contribute to leukocytosis.  .Blood culture from 11/4 has been no growth so far.Continue to monitor CBC. Recent Labs  Lab 09/29/22 0458 09/30/22 0448 10/01/22 0520 10/03/22 0621 10/04/22 0347 10/05/22 1058  WBC 6.2 12.0* 4.9 11.5* 14.6* 18.6*  PROCALCITON 38.47  --   --   --   --   --     Prostate cancer with mets to bone followed by oncology outpatient.  Palliative care consulted  Acute metabolic encephalopathy Delirium tremens: Patient had confusion/encephalopathy due to sepsis and alcohol withdrawal.  Mentation oral much better improved communicative interactive.Family endorsed he is a heavy drinker with liquor and beer.  Managed w/ CIWA Ativan,continue to provide supportive care, delirium precautions,PT OT. Needs SNF  Alcohol abuse: Ultrasound showing steatohepatitis versus liver cirrhosis, continue thiamine, folate multivitamins.  Continue to encourage alcohol cessation.   Acute kidney injury: Peaked to 2.2 but now improved to 1.3, encourage oral hydration, cut back on ivf  LABS PENDING. Recent Labs    04/18/22 0928 09/08/22 0826 09/27/22 1954 09/28/22 0609 09/29/22 0458 09/30/22 0448 10/01/22 0520 10/02/22 0608 10/03/22 0621 10/04/22 0347  BUN 24* 15 51* 54* 59* 72* 79* 71* 54* 40*  CREATININE 1.32* 1.00 1.85* 2.10* 2.04* 2.55* 2.25* 1.93* 1.56* 1.34*   Urine retention needing Foley catheter, TOV once more mobile  Acute gout arthropathy with swelling of his knees x-ray  no acute finding with a small amount of joint effusion.  He is on prednisone, allopurinol> discontinue allopurinol 11/10> with plan to resume once acute gout resolves, check uric acid today. With ongoing pain switch  prednisone to Solu-Medrol iv, continue pain control with oxycodone and Tylenol  Thrombocytopenia.  Likely in the setting of sepsis, possible alcoholic liver disease related too.  Resolved.  Recent Labs  Lab 09/30/22 0448 10/01/22 0520 10/03/22 0621 10/04/22 0347 10/05/22 1058  PLT 100* 67* 102* 127* 167   Anemia: Multifactorial with chronic disease and folate deficiency:had a acute drop of hemoglobin to 7.4, but quickly bounced back.Seen by GI, no plan for EGD.  Continue folate supplementation trend H&H as below.  After 1 unit PRBC hemoglobin appropriately increased to 8.0 g.  Monitor  Recent Labs    09/24/22 1401 09/27/22 1954 09/29/22 0458 09/30/22 0447 09/30/22 0448 10/02/22 0946 10/03/22 0621 10/04/22 0347 10/04/22 1833 10/05/22 1058  HGB  --    < > 9.6*  --    < > 8.8* 7.5* 6.9* 8.0* 8.1*  MCV  --    < > 99.3  --    < >  --  99.1 99.5  --  95.7  VITAMINB12 475  --   --   --   --   --   --   --   --   --   FOLATE 4.3*  --   --   --   --   --   --   --   --   --   FERRITIN  --   --   --  3,489*  --   --   --   --   --   --   TIBC  --   --   --  147*  --   --   --   --   --   --   IRON  --   --   --  69  --   --   --   --   --   --   RETICCTPCT  --   --  0.9  --   --   --   --   --   --   --    < > = values in this interval not displayed.    Essential hypertension BP stable on metoprolol, Imdur HLD continue statin Diabetes mellitus type 2, A1c 6.5, blood sugar fairly controlled monitor on SSI as below Recent Labs  Lab 10/04/22 1152 10/04/22 1559 10/04/22 2031 10/05/22 0750 10/05/22 1211  GLUCAP 162* 184* 127* 120* 241*    Class I Obesity:Patient's Body mass index is 31.81 kg/m. : Will benefit with PCP follow-up, weight loss  healthy lifestyle and outpatient sleep evaluation.  Goals of care given patient's cancer multiple medical problems palliative care consulted  DVT prophylaxis: Place and maintain sequential compression device Start: 10/04/22 1113 Code Status:    Code Status: Full Code Family Communication: plan of care discussed with patient/son updated 11/9.    Patient status is: Inpatient because of acute encephalopathy sepsis Level of care: Med-Surg  Dispo: The patient is from: home            Anticipated disposition: SNF over the weekend if pain remains stable and WBC improves   Mobility Assessment (last 72 hours)     Mobility Assessment     Row Name 10/05/22 1137 10/04/22 2139 10/04/22 0837 10/03/22 2000 10/03/22 1100   Does patient have  an order for bedrest or is patient medically unstable -- No - Continue assessment No - Continue assessment No - Continue assessment --   What is the highest level of mobility based on the progressive mobility assessment? Level 2 ([place]) - Balance while sitting on edge of bed and cannot stand Level 2 (Chairfast) - Balance while sitting on edge of bed and cannot stand Level 2 (Chairfast) - Balance while sitting on edge of bed and cannot stand Level 2 (Chairfast) - Balance while sitting on edge of bed and cannot stand Level 2 (Chairfast) - Balance while sitting on edge of bed and cannot stand   Is the above level different from baseline mobility prior to current illness? -- Yes - Recommend PT order -- Yes - Recommend PT order --    Row Name 10/03/22 1610           What is the highest level of mobility based on the progressive mobility assessment? Level 2 (Chairfast) - Balance while sitting on edge of bed and cannot stand                 Objective: Vitals last 24 hrs: Vitals:   10/04/22 1433 10/04/22 1916 10/05/22 0230 10/05/22 0748  BP: 131/79 139/78 124/81 129/73  Pulse: 85 85 74 83  Resp: '20 20 15 18  '$ Temp: 98.5 F (36.9 C) 99 F (37.2 C) 98.2 F (36.8 C) 98 F (36.7 C)  TempSrc:  Oral Oral   SpO2: 100% 98% 98% 100%  Weight:      Height:       Weight change:   Physical Examination: General exam: AAOX2-3, weak,older appearing HEENT:Oral mucosa moist, Ear/Nose WNL grossly, dentition  normal. Respiratory system: bilaterally CLEAR BS, no use of accessory muscle Cardiovascular system: S1 & S2 +, regular rate. Gastrointestinal system: Abdomen soft, NT,ND,BS+ Nervous System:Alert, awake, moving extremities and grossly nonfocal Extremities: LE ankle edema on lower legs, tender lt le ankle and knees Skin: No rashes,no icterus. MSK: Normal muscle bulk,tone, power   Medications reviewed:  Scheduled Meds:  aspirin EC  81 mg Oral Daily   Chlorhexidine Gluconate Cloth  6 each Topical Daily   folic acid  1 mg Oral Daily   insulin aspart  0-9 Units Subcutaneous TID WC   isosorbide mononitrate  30 mg Oral Daily   methylPREDNISolone (SOLU-MEDROL) injection  80 mg Intravenous Q24H   metoprolol tartrate  25 mg Oral BID   multivitamin with minerals  1 tablet Oral Daily   pantoprazole  40 mg Oral Daily   rosuvastatin  10 mg Oral Daily   thiamine  100 mg Oral Daily   Or   thiamine  100 mg Intravenous Daily  Continuous Infusions:  ampicillin-sulbactam (UNASYN) IV 3 g (10/05/22 0903)   lactated ringers 30 mL/hr at 10/05/22 0802     Diet Order             DIET DYS 3 Room service appropriate? Yes with Assist; Fluid consistency: Thin  Diet effective now                  Intake/Output Summary (Last 24 hours) at 10/05/2022 1302 Last data filed at 10/05/2022 0820 Gross per 24 hour  Intake 2723.04 ml  Output 1900 ml  Net 823.04 ml   Net IO Since Admission: 5,156.97 mL [10/05/22 1302]  Wt Readings from Last 3 Encounters:  10/01/22 97.7 kg  09/08/22 99.8 kg  03/12/22 98.9 kg     Unresulted Labs (From  admission, onward)     Start     Ordered   10/04/22 1114  Occult blood card to lab, stool  Once,   R        10/04/22 1113   10/04/22 0500  Comprehensive metabolic panel  Daily at 5am,   R     Question:  Specimen collection method  Answer:  Lab=Lab collect   10/03/22 1306   10/04/22 0500  CBC  Daily at 5am,   R     Question:  Specimen collection method  Answer:  Lab=Lab  collect   10/03/22 1306          Data Reviewed: I have personally reviewed following labs and imaging studies CBC: Recent Labs  Lab 09/30/22 0448 10/01/22 0520 10/01/22 0940 10/02/22 0946 10/03/22 0621 10/04/22 0347 10/04/22 1833 10/05/22 1058  WBC 12.0* 4.9  --   --  11.5* 14.6*  --  18.6*  HGB 10.0* 7.4*   < > 8.8* 7.5* 6.9* 8.0* 8.1*  HCT 30.3* 22.2*  --   --  22.6* 20.7* 23.9* 24.2*  MCV 97.7 97.4  --   --  99.1 99.5  --  95.7  PLT 100* 67*  --   --  102* 127*  --  167   < > = values in this interval not displayed.   Basic Metabolic Panel: Recent Labs  Lab 09/30/22 0448 10/01/22 0520 10/02/22 0608 10/03/22 0621 10/04/22 0347  NA 137 137 139 142 137  K 4.2 4.0 3.8 3.9 4.1  CL 106 108 108 111 109  CO2 17* 22 19* 24 24  GLUCOSE 200* 157* 121* 123* 81  BUN 72* 79* 71* 54* 40*  CREATININE 2.55* 2.25* 1.93* 1.56* 1.34*  CALCIUM 8.4* 8.1* 8.5* 8.3* 7.9*  MG 2.1 2.1 2.1 1.9 1.7  PHOS  --   --  2.6 2.3*  --    GFR: Estimated Creatinine Clearance: 64.2 mL/min (A) (by C-G formula based on SCr of 1.34 mg/dL (H)). Liver Function Tests: Recent Labs  Lab 10/04/22 0347  AST 18  ALT 12  ALKPHOS 157*  BILITOT 0.8  PROT 5.5*  ALBUMIN 2.0*  Coagulation Profile: Recent Results (from the past 240 hour(s))  Blood Culture (routine x 2)     Status: Abnormal   Collection Time: 09/27/22  7:53 PM   Specimen: BLOOD  Result Value Ref Range Status   Specimen Description   Final    BLOOD LEFT FA Performed at [facility], [address], [place], [place] [address]    Special Requests   Final    BOTTLES DRAWN AEROBIC AND ANAEROBIC Blood Culture results may not be optimal due to an excessive volume of blood received in culture bottles Performed at [facility], [address], [place], [place] [address]    Culture  Setup Time   Final    GRAM NEGATIVE RODS IN BOTH AEROBIC AND ANAEROBIC BOTTLES CRITICAL RESULT CALLED TO, READ BACK BY AND VERIFIED WITH:  Nat Math MERRIL 09/28/22 0745 MW Performed at [facility], [place] [address], [place],  [address]    Culture ESCHERICHIA COLI (A)  Final   Report Status 09/30/2022 FINAL  Final   Organism ID, Bacteria ESCHERICHIA COLI  Final      Susceptibility   Escherichia coli - MIC*    AMPICILLIN <=2 SENSITIVE Sensitive     CEFAZOLIN <=4 SENSITIVE Sensitive     CEFEPIME <=0.12 SENSITIVE Sensitive     CEFTAZIDIME <=1 SENSITIVE  Sensitive     CEFTRIAXONE <=0.25 SENSITIVE Sensitive     CIPROFLOXACIN <=0.25 SENSITIVE Sensitive     GENTAMICIN <=1 SENSITIVE Sensitive     IMIPENEM <=0.25 SENSITIVE Sensitive     TRIMETH/SULFA <=20 SENSITIVE Sensitive     AMPICILLIN/SULBACTAM <=2 SENSITIVE Sensitive     PIP/TAZO <=4 SENSITIVE Sensitive     * ESCHERICHIA COLI  Blood Culture ID Panel (Reflexed)     Status: Abnormal   Collection Time: 09/27/22  7:53 PM  Result Value Ref Range Status   Enterococcus faecalis NOT DETECTED NOT DETECTED Final   Enterococcus Faecium NOT DETECTED NOT DETECTED Final   Listeria monocytogenes NOT DETECTED NOT DETECTED Final   Staphylococcus species NOT DETECTED NOT DETECTED Final   Staphylococcus aureus (BCID) NOT DETECTED NOT DETECTED Final   Staphylococcus epidermidis NOT DETECTED NOT DETECTED Final   Staphylococcus lugdunensis NOT DETECTED NOT DETECTED Final   Streptococcus species NOT DETECTED NOT DETECTED Final   Streptococcus agalactiae NOT DETECTED NOT DETECTED Final   Streptococcus pneumoniae NOT DETECTED NOT DETECTED Final   Streptococcus pyogenes NOT DETECTED NOT DETECTED Final   A.calcoaceticus-baumannii NOT DETECTED NOT DETECTED Final   Bacteroides fragilis NOT DETECTED NOT DETECTED Final   Enterobacterales DETECTED (A) NOT DETECTED Final    Comment: Enterobacterales represent a large order of gram negative bacteria, not a single organism. CRITICAL RESULT CALLED TO, READ BACK BY AND VERIFIED WITH: KRISTIAN MERRIL 09/28/22 0745 MW    Enterobacter cloacae  complex NOT DETECTED NOT DETECTED Final   Escherichia coli DETECTED (A) NOT DETECTED Final    Comment: CRITICAL RESULT CALLED TO, READ BACK BY AND VERIFIED WITH: KRISTIAN MERRIL 09/28/22 0745 MW    Klebsiella aerogenes NOT DETECTED NOT DETECTED Final   Klebsiella oxytoca NOT DETECTED NOT DETECTED Final   Klebsiella pneumoniae NOT DETECTED NOT DETECTED Final   Proteus species NOT DETECTED NOT DETECTED Final   Salmonella species NOT DETECTED NOT DETECTED Final   Serratia marcescens NOT DETECTED NOT DETECTED Final   Haemophilus influenzae NOT DETECTED NOT DETECTED Final   Neisseria meningitidis NOT DETECTED NOT DETECTED Final   Pseudomonas aeruginosa NOT DETECTED NOT DETECTED Final   Stenotrophomonas maltophilia NOT DETECTED NOT DETECTED Final   Candida albicans NOT DETECTED NOT DETECTED Final   Candida auris NOT DETECTED NOT DETECTED Final   Candida glabrata NOT DETECTED NOT DETECTED Final   Candida krusei NOT DETECTED NOT DETECTED Final   Candida parapsilosis NOT DETECTED NOT DETECTED Final   Candida tropicalis NOT DETECTED NOT DETECTED Final   Cryptococcus neoformans/gattii NOT DETECTED NOT DETECTED Final   CTX-M ESBL NOT DETECTED NOT DETECTED Final   Carbapenem resistance IMP NOT DETECTED NOT DETECTED Final   Carbapenem resistance KPC NOT DETECTED NOT DETECTED Final   Carbapenem resistance NDM NOT DETECTED NOT DETECTED Final   Carbapenem resist OXA 48 LIKE NOT DETECTED NOT DETECTED Final   Carbapenem resistance VIM NOT DETECTED NOT DETECTED Final    Comment: Performed at [facility], [address] [place], [place] [address]  Resp Panel by RT-PCR (Flu A&B, Covid) Anterior Nasal Swab     Status: None   Collection Time: 09/27/22  7:54 PM   Specimen: Anterior Nasal Swab  Result Value Ref Range Status   SARS Coronavirus 2 by RT PCR NEGATIVE NEGATIVE Final    Comment: (NOTE) SARS-CoV-2 target nucleic acids are NOT DETECTED.  The SARS-CoV-2 RNA is generally detectable in  upper respiratory specimens during the acute phase of infection. The lowest concentration of  SARS-CoV-2 viral copies this assay can detect is 138 copies/mL. A negative result does not preclude SARS-Cov-2 infection and should not be used as the sole basis for treatment or other patient management decisions. A negative result may occur with  improper specimen collection/handling, submission of specimen other than nasopharyngeal swab, presence of viral mutation(s) within the areas targeted by this assay, and inadequate number of viral copies(<138 copies/mL). A negative result must be combined with clinical observations, patient history, and epidemiological information. The expected result is Negative.  Fact Sheet for Patients:  EntrepreneurPulse.com.au  Fact Sheet for Healthcare Providers:  IncredibleEmployment.be  This test is no t yet approved or cleared by the [place] FDA and  has been authorized for detection and/or diagnosis of SARS-CoV-2 by FDA under an Emergency Use Authorization (EUA). This EUA will remain  in effect (meaning this test can be used) for the duration of the COVID-19 declaration under Section 564(b)(1) of the Act, 21 U.S.C.section 360bbb-3(b)(1), unless the authorization is terminated  or revoked sooner.       Influenza A by PCR NEGATIVE NEGATIVE Final   Influenza B by PCR NEGATIVE NEGATIVE Final    Comment: (NOTE) The Xpert Xpress SARS-CoV-2/FLU/RSV plus assay is intended as an aid in the diagnosis of influenza from Nasopharyngeal swab specimens and should not be used as a sole basis for treatment. Nasal washings and aspirates are unacceptable for Xpert Xpress SARS-CoV-2/FLU/RSV testing.  Fact Sheet for Patients: EntrepreneurPulse.com.au  Fact Sheet for Healthcare Providers: IncredibleEmployment.be  This test is not yet approved or cleared by the [place] FDA and has been  authorized for detection and/or diagnosis of SARS-CoV-2 by FDA under an Emergency Use Authorization (EUA). This EUA will remain in effect (meaning this test can be used) for the duration of the COVID-19 declaration under Section 564(b)(1) of the Act, 21 U.S.C. section 360bbb-3(b)(1), unless the authorization is terminated or revoked.  Performed at [facility], [address], [place], [place] [address]   Blood Culture (routine x 2)     Status: Abnormal   Collection Time: 09/27/22  7:54 PM   Specimen: BLOOD  Result Value Ref Range Status   Specimen Description   Final    BLOOD RIGHT FA Performed at [facility], [address] [place], [place] [address]    Special Requests   Final    BOTTLES DRAWN AEROBIC AND ANAEROBIC Blood Culture results may not be optimal due to an excessive volume of blood received in culture bottles Performed at [facility], [address], [place], [place] [address]    Culture  Setup Time   Final    GRAM NEGATIVE RODS IN BOTH AEROBIC AND ANAEROBIC BOTTLES CRITICAL RESULT CALLED TO, READ BACK BY AND VERIFIED WITH: Nat Math MERRIL 09/28/22 0745 MW Performed at [facility], [address] [place], [place] [address]    Culture (A)  Final    ESCHERICHIA COLI SUSCEPTIBILITIES PERFORMED ON PREVIOUS CULTURE WITHIN THE LAST 5 DAYS. Performed at [facility], [place] [address] [place], [place] [address]    Report Status 09/30/2022 FINAL  Final  Urine Culture     Status: Abnormal   Collection Time: 09/27/22  9:19 PM   Specimen: In/Out Cath Urine  Result Value Ref Range Status   Specimen Description   Final    IN/OUT CATH URINE Performed at [facility], [address], [place], [place] [address]    Special Requests   Final    NONE  Performed at [facility], [address] [place], [place] [address]    Culture (A)  Final    >=100,000 COLONIES/mL ENTEROCOCCUS FAECALIS >=100,000 COLONIES/mL  KLEBSIELLA OXYTOCA >=100,000 COLONIES/mL ESCHERICHIA COLI    Report Status 09/30/2022 FINAL  Final   Organism ID, Bacteria ENTEROCOCCUS FAECALIS (A)  Final   Organism ID, Bacteria KLEBSIELLA OXYTOCA (A)  Final   Organism ID, Bacteria ESCHERICHIA COLI (A)  Final      Susceptibility   Escherichia coli - MIC*    AMPICILLIN <=2 SENSITIVE Sensitive     CEFAZOLIN <=4 SENSITIVE Sensitive     CEFEPIME <=0.12 SENSITIVE Sensitive     CEFTRIAXONE <=0.25 SENSITIVE Sensitive     CIPROFLOXACIN <=0.25 SENSITIVE Sensitive     GENTAMICIN <=1 SENSITIVE Sensitive     IMIPENEM <=0.25 SENSITIVE Sensitive     NITROFURANTOIN <=16 SENSITIVE Sensitive     TRIMETH/SULFA <=20 SENSITIVE Sensitive     AMPICILLIN/SULBACTAM <=2 SENSITIVE Sensitive     PIP/TAZO <=4 SENSITIVE Sensitive     * >=100,000 COLONIES/mL ESCHERICHIA COLI   Enterococcus faecalis - MIC*    AMPICILLIN <=2 SENSITIVE Sensitive     NITROFURANTOIN <=16 SENSITIVE Sensitive     VANCOMYCIN 1 SENSITIVE Sensitive     * >=100,000 COLONIES/mL ENTEROCOCCUS FAECALIS   Klebsiella oxytoca - MIC*    AMPICILLIN >=32 RESISTANT Resistant     CEFAZOLIN 8 SENSITIVE Sensitive     CEFEPIME <=0.12 SENSITIVE Sensitive     CEFTRIAXONE <=0.25 SENSITIVE Sensitive     CIPROFLOXACIN <=0.25 SENSITIVE Sensitive     GENTAMICIN <=1 SENSITIVE Sensitive     IMIPENEM <=0.25 SENSITIVE Sensitive     NITROFURANTOIN <=16 SENSITIVE Sensitive     TRIMETH/SULFA <=20 SENSITIVE Sensitive     AMPICILLIN/SULBACTAM 8 SENSITIVE Sensitive     PIP/TAZO <=4 SENSITIVE Sensitive     * >=100,000 COLONIES/mL KLEBSIELLA OXYTOCA  MRSA Next Gen by PCR, Nasal     Status: None   Collection Time: 09/28/22  3:00 AM   Specimen: Nasal Mucosa; Nasal Swab  Result Value Ref Range Status   MRSA by PCR Next Gen NOT DETECTED NOT DETECTED Final    Comment: (NOTE) The GeneXpert MRSA Assay (FDA approved for NASAL specimens only), is one component of a comprehensive MRSA colonization  surveillance program. It is not intended to diagnose MRSA infection nor to guide or monitor treatment for MRSA infections. Test performance is not FDA approved in patients less than 22 years old. Performed at [facility], [address] [place], [place] [address]   Culture, blood (Routine X 2) w Reflex to ID Panel     Status: None   Collection Time: 09/29/22  2:10 PM   Specimen: BLOOD RIGHT HAND  Result Value Ref Range Status   Specimen Description BLOOD RIGHT HAND  Final   Special Requests   Final    BOTTLES DRAWN AEROBIC AND ANAEROBIC Blood Culture adequate volume   Culture   Final    NO GROWTH 5 DAYS Performed at [facility], [address] [place],  [address]    Report Status 10/04/2022 FINAL  Final  Culture, blood (Routine X 2) w Reflex to ID Panel     Status: None   Collection Time: 09/29/22  2:15 PM   Specimen: Right Antecubital; Blood  Result Value Ref Range Status   Specimen Description RIGHT ANTECUBITAL  Final   Special Requests   Final    BOTTLES DRAWN AEROBIC AND ANAEROBIC Blood Culture adequate volume  Culture   Final    NO GROWTH 5 DAYS Performed at [facility], [address] [place], [place] [address]    Report Status 10/04/2022 FINAL  Final    Antimicrobials: Anti-infectives (From admission, onward)    Start     Dose/Rate Route Frequency Ordered Stop   10/01/22 1800  Ampicillin-Sulbactam (UNASYN) 3 g in sodium chloride 0.9 % 100 mL IVPB        3 g 200 mL/hr over 30 Minutes Intravenous Every 6 hours 10/01/22 0913 10/12/22 0259   09/30/22 1430  amoxicillin (AMOXIL) capsule 500 mg  Status:  Discontinued        500 mg Oral Every 8 hours 09/30/22 1329 10/01/22 0913   09/28/22 0900  cefTRIAXone (ROCEPHIN) 2 g in sodium chloride 0.9 % 100 mL IVPB  Status:  Discontinued        2 g 200 mL/hr over 30 Minutes Intravenous Every 24 hours 09/28/22 0842 10/01/22 0913   09/28/22 0800  ceFEPIme (MAXIPIME) 2 g in sodium chloride  0.9 % 100 mL IVPB  Status:  Discontinued        2 g 200 mL/hr over 30 Minutes Intravenous Every 12 hours 09/27/22 2314 09/28/22 0842   09/27/22 2015  vancomycin (VANCOREADY) IVPB 1500 mg/300 mL        1,500 mg 150 mL/hr over 120 Minutes Intravenous  Once 09/27/22 2005 09/27/22 2342   09/27/22 2000  ceFEPIme (MAXIPIME) 2 g in sodium chloride 0.9 % 100 mL IVPB        2 g 200 mL/hr over 30 Minutes Intravenous  Once 09/27/22 1955 09/27/22 2048   09/27/22 2000  metroNIDAZOLE (FLAGYL) IVPB 500 mg        500 mg 100 mL/hr over 60 Minutes Intravenous  Once 09/27/22 1955 09/27/22 2142   09/27/22 2000  vancomycin (VANCOCIN) IVPB 1000 mg/200 mL premix  Status:  Discontinued        1,000 mg 200 mL/hr over 60 Minutes Intravenous  Once 09/27/22 1955 09/27/22 2005      Culture/Microbiology    Component Value Date/Time   SDES RIGHT ANTECUBITAL 09/29/2022 1415   SPECREQUEST  09/29/2022 1415    BOTTLES DRAWN AEROBIC AND ANAEROBIC Blood Culture adequate volume   CULT  09/29/2022 1415    NO GROWTH 5 DAYS Performed at [facility], [address], [place], [place] [address]    REPTSTATUS 10/04/2022 FINAL 09/29/2022 1415  Radiology Studies: No results found.   LOS: 8 days   Antonieta Pert, MD [facility]  10/05/2022, 1:02 PM

## 2022-10-06 DIAGNOSIS — N390 Urinary tract infection, site not specified: Secondary | ICD-10-CM | POA: Diagnosis not present

## 2022-10-06 DIAGNOSIS — A419 Sepsis, unspecified organism: Secondary | ICD-10-CM | POA: Diagnosis not present

## 2022-10-06 LAB — COMPREHENSIVE METABOLIC PANEL
ALT: 11 U/L (ref 0–44)
AST: 12 U/L — ABNORMAL LOW (ref 15–41)
Albumin: 2.1 g/dL — ABNORMAL LOW (ref 3.5–5.0)
Alkaline Phosphatase: 208 U/L — ABNORMAL HIGH (ref 38–126)
Anion gap: 5 (ref 5–15)
BUN: 25 mg/dL — ABNORMAL HIGH (ref 8–23)
CO2: 25 mmol/L (ref 22–32)
Calcium: 7.9 mg/dL — ABNORMAL LOW (ref 8.9–10.3)
Chloride: 108 mmol/L (ref 98–111)
Creatinine, Ser: 1.21 mg/dL (ref 0.61–1.24)
GFR, Estimated: >60 mL/min (ref >60)
Glucose, Bld: 147 mg/dL — ABNORMAL HIGH (ref 70–99)
Potassium: 4.5 mmol/L (ref 3.5–5.1)
Sodium: 138 mmol/L (ref 135–145)
Total Bilirubin: 0.9 mg/dL (ref 0.3–1.2)
Total Protein: 6 g/dL — ABNORMAL LOW (ref 6.5–8.1)

## 2022-10-06 LAB — CBC
HCT: 24.4 % — ABNORMAL LOW (ref 39.0–52.0)
Hemoglobin: 8.1 g/dL — ABNORMAL LOW (ref 13.0–17.0)
MCH: 31.9 pg (ref 26.0–34.0)
MCHC: 33.2 g/dL (ref 30.0–36.0)
MCV: 96.1 fL (ref 80.0–100.0)
Platelets: 193 10*3/uL (ref 150–400)
RBC: 2.54 MIL/uL — ABNORMAL LOW (ref 4.22–5.81)
RDW: 16.8 % — ABNORMAL HIGH (ref 11.5–15.5)
WBC: 20.6 10*3/uL — ABNORMAL HIGH (ref 4.0–10.5)
nRBC: 0.1 % (ref 0.0–0.2)

## 2022-10-06 LAB — GLUCOSE, CAPILLARY
Glucose-Capillary: 151 mg/dL — ABNORMAL HIGH (ref 70–99)
Glucose-Capillary: 162 mg/dL — ABNORMAL HIGH (ref 70–99)

## 2022-10-06 MED ORDER — POLYETHYLENE GLYCOL 3350 17 G PO PACK: 17.0000 g | PACK | Freq: Every day | ORAL | 0 refills | Status: DC | PRN

## 2022-10-06 MED ORDER — PANTOPRAZOLE SODIUM 40 MG PO TBEC: 40.0000 mg | DELAYED_RELEASE_TABLET | Freq: Every day | ORAL | Status: DC

## 2022-10-06 MED ORDER — PREDNISONE 10 MG PO TABS: 20.0000 mg | ORAL_TABLET | Freq: Every day | ORAL | 0 refills | Status: AC

## 2022-10-06 MED ORDER — AMOXICILLIN-POT CLAVULANATE 875-125 MG PO TABS: 1.0000 | ORAL_TABLET | Freq: Two times a day (BID) | ORAL | Status: AC

## 2022-10-06 MED ORDER — OXYCODONE-ACETAMINOPHEN 5-325 MG PO TABS: 1.0000 | ORAL_TABLET | ORAL | 0 refills | Status: DC | PRN

## 2022-10-06 NOTE — Progress Notes (Signed)
Patient discharged to a nursing facility via an EMS transport. Hand-off report provided to EMS personnel. Vital signs checked and recorded, stable and afebrile. Discharge papers and patient's belongings with patient.

## 2022-10-06 NOTE — Discharge Summary (Addendum)
Physician Discharge Summary  GRAIG HESSLING GLO:[id number] DOB: 11/18/1956 DOA: 09/27/2022  PCP: Lahoma Rocker, MD  Admit date: 09/27/2022 Discharge date: 10/06/2022  Admitted From: Home Disposition:  SNF  Recommendations for Outpatient Follow-up:  Follow up with PCP in 1-2 weeks Please obtain BMP/CBC in one week your next doctors visit.  Prednisone '20mg'$  po daily x 5 days, then resume Home PO allopurinol Advised to stay complaints with his home medications  Augmentin PO BID for 5 more days Continue foley catheter, Will need Voiding trail in 3-4 days. If fails, then will need Urology follow up.    Discharge Condition: Stable CODE STATUS: Full Diet recommendation: 2g Na  Brief/Interim Summary: 66-yom w/ recent diagnosis of prostate cancer with bone metastases, hypertension, diabetes mellitus and gout who has been noncompliant with medications and presented to the emergency room on 11/2 for confusion and disorientation.   In ED: found to have severe sepsis felt to be secondary to UTI from indwelling Foley catheter.  Patient started on IV fluids and antibiotics and placed in stepdown unit.  11/3, patient more alert and appropriate and transferred to medical floor. Blood cultures growing out E. coli.  Urine culture also positive for E. Coli, Klebsiella oxytoca, Enterococcus faecalis.  Patient initially treated with Rocephin>changed to Unasyn. Patient had a urinary retention secondary to blocked urinary catheter 11/4, flushed, resolved.  Patient also developed significant agitation on 11/4 and with his heavy alcohol use history- started CIWA protocol.  Psychiatry was consulted He had drop in hemoglobin 1 unit PRBC transfused 11/9 overall leg pain knee pain improving and mental status much better alert awake on 11/9   Today his Hb is better, remains afebrile and minimal knee pain. Mentation has improved as well. Medically stable for dc.   At first ptn hesistant to go to the facility, but no  agreeable. TOC and I have spoken wit his family.      Assessment & Plan:  Principal Problem:   Severe sepsis secondary to UTI [facility]) Active Problems:   Acute metabolic encephalopathy   Acute gouty arthropathy   AKI (acute kidney injury) ([place])   Indwelling Foley catheter present   Prostate cancer metastatic to bone ([place])   Anemia   Essential hypertension   Diabetes mellitus without complication (HCC)   Overweight (BMI 25.0-29.9)   Thrombocytopenia ([place])   Septicemia due to E. coli (HCC)   Alcohol abuse   Delirium tremens ([place])      Severe sepsis with E. coli bacteremia due to UTI: UTI due to E. Coli: Sepsis POA in the setting of E. coli UTI.  Blood culture with E. coli, urine culture also showed Klebsiella oxytocin Enterococcus faecalis, e coli> antibiotics changed to Unasyn 11/6 per C/S.  Will change to PO Augmentin Plan for total of 10 days. WBC up but ptn is also on steroids. Blood culture from 11/4 has been no growth so far.Continue to monitor CBC.   Prostate cancer with mets to bone followed by oncology outpatient.  Will need outpatient Palliative care, can be arranged by PCP or oncology.    Acute metabolic encephalopathy Delirium tremens: Patient had confusion/encephalopathy due to sepsis and alcohol withdrawal.  Mentation oral much better improved communicative interactive.Family endorsed he is a heavy drinker with liquor and beer.  Managed w/ CIWA Ativan,continue to provide supportive care, delirium precautions,PT OT. Needs SNF   Alcohol abuse: Ultrasound showing steatohepatitis versus liver cirrhosis, continue thiamine, folate multivitamins.  Continue to encourage alcohol cessation. No longer showing signs of  withdrawal.    Acute kidney injury: Improved.   Peaked to 2.2 but now 1.21   Urine retention  needing Foley catheter, TOV once more mobile   Acute gout arthropathy  XR knees showing small effusion. Currently on Steroids, resume Allopurinol  thereafter.       Thrombocytopenia.  Likely in the setting of sepsis, possible alcoholic liver disease related too.  Resolved.    Anemia:  Multifactorial with chronic disease and folate deficiency:had a acute drop of hemoglobin to 7.4, but quickly bounced back.Seen by GI, no plan for EGD.  Continue folate supplementation trend H&H as below.  After 1 unit PRBC (11/9)hemoglobin appropriately increased to 8.0 g.  Monitor    Essential hypertension BP stable on metoprolol, Imdur   HLD continue statin   Diabetes mellitus type 2, A1c 6.5, blood sugar fairly controlled monitor on SSI as below   Class I Obesity:Patient's Body mass index is 31.81 kg/m. : Will benefit with PCP follow-up, weight loss  healthy lifestyle and outpatient sleep evaluation.   Goals of care given patient's cancer multiple medical problems palliative care consulted     PT/OT = SNF; Today able to bear weight and feels better.    Discharge Diagnoses:  Principal Problem:   Severe sepsis secondary to UTI [facility]) Active Problems:   Acute metabolic encephalopathy   Acute gouty arthropathy   AKI (acute kidney injury) ([place])   Indwelling Foley catheter present   Prostate cancer metastatic to bone (HCC)   Anemia   Essential hypertension   Diabetes mellitus without complication (HCC)   Overweight (BMI 25.0-29.9)   Thrombocytopenia ([place])   Septicemia due to E. coli (HCC)   Alcohol abuse   Delirium tremens ([place])      Subjective: Tells me his knees feels better, no other complaints.  Mentation has improved as well.   Discharge Exam: Vitals:   10/06/22 0455 10/06/22 0826  BP: (!) 164/84 (!) 160/100  Pulse: 85 84  Resp: 20 18  Temp: 98.4 F (36.9 C) 98.6 F (37 C)  SpO2: 96% 100%   Vitals:   10/05/22 1521 10/05/22 1926 10/06/22 0455 10/06/22 0826  BP: (!) 139/96 (!) 163/88 (!) 164/84 (!) 160/100  Pulse: 82 88 85 84  Resp: '18 16 20 18  '$ Temp: 97.8 F (36.6 C) 98.4 F (36.9 C) 98.4 F (36.9 C) 98.6 F (37 C)  TempSrc:     Oral  SpO2: 98% 99% 96% 100%  Weight:      Height:        General: Pt is alert, awake, not in acute distress Cardiovascular: RRR, S1/S2 +, no rubs, no gallops Respiratory: CTA bilaterally, no wheezing, no rhonchi Abdominal: Soft, NT, ND, bowel sounds + Extremities: no edema, no cyanosis  Discharge Instructions   Allergies as of 10/06/2022       Reactions   Albuterol Nausea And Vomiting   Mucinex [guaifenesin Er] Rash        Medication List     STOP taking these medications    colchicine 0.6 MG tablet   traMADol 50 MG tablet Commonly known as: ULTRAM       TAKE these medications    allopurinol 100 MG tablet Commonly known as: ZYLOPRIM Take 2 tablets (200 mg total) by mouth 2 (two) times daily.   amoxicillin-clavulanate 875-125 MG tablet Commonly known as: AUGMENTIN Take 1 tablet by mouth 2 (two) times daily for 5 days.   aspirin EC 81 MG tablet Take 1 tablet (81 mg total)  by mouth daily.   folic acid 1 MG tablet Commonly known as: FOLVITE Take 1 tablet (1 mg total) by mouth daily.   isosorbide mononitrate 30 MG 24 hr tablet Commonly known as: IMDUR TAKE 1 TABLET(30 MG) BY MOUTH DAILY   metFORMIN 500 MG tablet Commonly known as: GLUCOPHAGE Take 1 tablet (500 mg total) by mouth 2 (two) times daily with a meal.   metoprolol tartrate 25 MG tablet Commonly known as: LOPRESSOR Take 1 tablet (25 mg total) by mouth 2 (two) times daily.   oxyCODONE-acetaminophen 5-325 MG tablet Commonly known as: PERCOCET/ROXICET Take 1 tablet by mouth every 4 (four) hours as needed for moderate pain or severe pain.   pantoprazole 40 MG tablet Commonly known as: PROTONIX Take 1 tablet (40 mg total) by mouth daily before breakfast.   polyethylene glycol 17 g packet Commonly known as: MIRALAX / GLYCOLAX Take 17 g by mouth daily as needed for mild constipation or moderate constipation.   predniSONE 10 MG tablet Commonly known as: DELTASONE Take 2 tablets (20 mg  total) by mouth daily for 5 days.   rosuvastatin 10 MG tablet Commonly known as: Crestor Take 1 tablet (10 mg total) by mouth daily.   triamterene-hydrochlorothiazide 37.5-25 MG tablet Commonly known as: MAXZIDE-25 Take 1 tablet by mouth daily.        Contact information for after-discharge care     Destination     [facility] .   Service: Skilled Nursing Contact information: [address] [address] [phone]                    Allergies  Allergen Reactions   Albuterol Nausea And Vomiting   Mucinex [Guaifenesin Er] Rash    You were cared for by a hospitalist during your hospital stay. If you have any questions about your discharge medications or the care you received while you were in the hospital after you are discharged, you can call the unit and asked to speak with the hospitalist on call if the hospitalist that took care of you is not available. Once you are discharged, your primary care physician will handle any further medical issues. Please note that no refills for any discharge medications will be authorized once you are discharged, as it is imperative that you return to your primary care physician (or establish a relationship with a primary care physician if you do not have one) for your aftercare needs so that they can reassess your need for medications and monitor your lab values.   Procedures/Studies: US Abdomen Limited RUQ (LIVER/GB)  Result Date: 10/02/2022 CLINICAL DATA:  Hepatic cirrhosis, diabetes mellitus, hypertension EXAM: ULTRASOUND ABDOMEN LIMITED RIGHT UPPER QUADRANT COMPARISON:  02/23/2022 FINDINGS: Gallbladder: Dependent sludge and scattered echogenic shadowing foci consistent with gallstones in gallbladder. No gallbladder wall thickening, pericholecystic fluid, or sonographic Murphy sign. Common bile duct: Diameter: 5 mm, normal Liver: Echogenic parenchyma, likely fatty infiltration though  this can be seen with cirrhosis and certain infiltrative disorders. No definite hepatic mass or nodularity identified though assessment of intrahepatic detail is limited by sound attenuation. Portal vein is patent on color Doppler imaging with normal direction of blood flow towards the liver. Other: No RIGHT upper quadrant free fluid. IMPRESSION: Sludge and small shadowing calculi within gallbladder without evidence of acute cholecystitis or biliary obstruction. Question fatty infiltration of liver versus cirrhosis as above. Electronically Signed   By: Lavonia Dana M.D.   On: 10/02/2022 10:10  DG Knee 1-2 Views Right  Result Date: 09/29/2022 CLINICAL DATA:  Septic joint. EXAM: RIGHT KNEE - 1-2 VIEW COMPARISON:  None Available. FINDINGS: Small knee joint effusion. No evidence of lipohemarthrosis. Peripherally corticated ossicle adjacent to the superior pole of the patella likely represents the sequela of remote avulsive injury. No acute fracture or dislocation. Moderate tricompartmental degenerative change of the knee, worse within the medial compartment with joint space loss, subchondral sclerosis and osteophytosis. There is minimal spurring of the tibial spines. A small amount of chondrocalcinosis is seen within the lateral joint space. Scattered adjacent vascular calcifications. No radiopaque foreign body. No discrete areas of osteolysis to suggest osteomyelitis. IMPRESSION: 1. Small knee joint effusion.  Otherwise, no acute findings. 2. No discrete areas of osteolysis to suggest osteomyelitis. 3. Moderate tricompartmental degenerative change of the knee, worse within the medial compartment. 4. Chondrocalcinosis as could be seen in the setting of CPPD. Electronically Signed   By: Sandi Mariscal M.D.   On: 09/29/2022 11:47   US Venous Img Lower Unilateral Right (DVT)  Result Date: 09/29/2022 CLINICAL DATA:  Right lower extremity pain for past week. Evaluate for DVT. EXAM: RIGHT LOWER EXTREMITY VENOUS DOPPLER  ULTRASOUND TECHNIQUE: Gray-scale sonography with graded compression, as well as color Doppler and duplex ultrasound were performed to evaluate the lower extremity deep venous systems from the level of the common femoral vein and including the common femoral, femoral, profunda femoral, popliteal and calf veins including the posterior tibial, peroneal and gastrocnemius veins when visible. The superficial great saphenous vein was also interrogated. Spectral Doppler was utilized to evaluate flow at rest and with distal augmentation maneuvers in the common femoral, femoral and popliteal veins. COMPARISON:  Right lower extremity venous Doppler ultrasound-09/08/2022 (negative). FINDINGS: Contralateral Common Femoral Vein: Respiratory phasicity is normal and symmetric with the symptomatic side. No evidence of thrombus. Normal compressibility. Common Femoral Vein: No evidence of thrombus. Normal compressibility, respiratory phasicity and response to augmentation. Saphenofemoral Junction: No evidence of thrombus. Normal compressibility and flow on color Doppler imaging. Profunda Femoral Vein: No evidence of thrombus. Normal compressibility and flow on color Doppler imaging. Femoral Vein: No evidence of thrombus. Normal compressibility, respiratory phasicity and response to augmentation. Popliteal Vein: No evidence of thrombus. Normal compressibility, respiratory phasicity and response to augmentation. Calf Veins: No evidence of thrombus. Normal compressibility and flow on color Doppler imaging. Superficial Great Saphenous Vein: No evidence of thrombus. Normal compressibility. Other Findings:  None. IMPRESSION: No evidence of DVT within the right lower extremity, similar to the 09/08/2022 examination. Electronically Signed   By: Sandi Mariscal M.D.   On: 09/29/2022 11:45   DG Chest Port 1 View  Result Date: 09/27/2022 CLINICAL DATA:  Generalized weakness and low energy. Low-grade fever. Questionable sepsis. Check for  infiltrate. EXAM: PORTABLE CHEST 1 VIEW COMPARISON:  PA Lat chest 04/01/2021 FINDINGS: There is a low inspiration exaggerating the mediastinum and heart. There is mild-to-moderate cardiomegaly. Aortic tortuosity and ectasia are noted with patchy calcification of the transverse segment. The hypoinflated lungs appear generally clear but there is limited view of bases. No pleural effusion is seen. Thoracic spondylosis. There is a history of prostate cancer with bone metastases. Recent abdomen and pelvis CT 09/08/2022 demonstrated widespread bony metastases in the ribcage, spine and pelvis. There are sclerotic lesions in both proximal humeri which are probably also metastatic. IMPRESSION: 1. Low inspiration exaggerating the mediastinum and heart. No evidence of acute chest process. Limited view of the bases. 2. Cardiomegaly. 3. Aortic atherosclerosis. 4. Widespread  bony metastases. Electronically Signed   By: Telford Nab M.D.   On: 09/27/2022 20:27   US Venous Img Lower Unilateral Right  Result Date: 09/08/2022 CLINICAL DATA:  Edema in right lower extremity EXAM: Right LOWER EXTREMITY VENOUS DOPPLER ULTRASOUND TECHNIQUE: Gray-scale sonography with compression, as well as color and duplex ultrasound, were performed to evaluate the deep venous system(s) from the level of the common femoral vein through the popliteal and proximal calf veins. COMPARISON:  None Available. FINDINGS: VENOUS Normal compressibility of the common femoral, superficial femoral, and popliteal veins, as well as the visualized calf veins. Visualized portions of profunda femoral vein and great saphenous vein unremarkable. No filling defects to suggest DVT on grayscale or color Doppler imaging. Doppler waveforms show normal direction of venous flow, normal respiratory plasticity and response to augmentation. Limited views of the contralateral common femoral vein are unremarkable. OTHER None. Limitations: none IMPRESSION: There is no evidence of  deep venous thrombosis in right lower extremity. Electronically Signed   By: Elmer Picker M.D.   On: 09/08/2022 15:38   CT ABDOMEN PELVIS W CONTRAST  Result Date: 09/08/2022 CLINICAL DATA:  Abdominal pain * Tracking Code: BO * EXAM: CT ABDOMEN AND PELVIS WITH CONTRAST TECHNIQUE: Multidetector CT imaging of the abdomen and pelvis was performed using the standard protocol following bolus administration of intravenous contrast. RADIATION DOSE REDUCTION: This exam was performed according to the departmental dose-optimization program which includes automated exposure control, adjustment of the mA and/or kV according to patient size and/or use of iterative reconstruction technique. CONTRAST:  197m OMNIPAQUE IOHEXOL 300 MG/ML  SOLN COMPARISON:  None Available. FINDINGS: Lower chest: No acute abnormality. Hepatobiliary: No solid liver abnormality is seen. Hepatic steatosis. Small gallstones at the gallbladder fundus. No gallbladder wall thickening, or biliary dilatation. Pancreas: Unremarkable. No pancreatic ductal dilatation or surrounding inflammatory changes. Spleen: Normal in size without significant abnormality. Adrenals/Urinary Tract: Adrenal glands are unremarkable. Mild bilateral hydronephrosis. No calculi or other obstructing etiology identified to the ureterovesicular junctions distended urinary bladder. Stomach/Bowel: Stomach is within normal limits. Appendix appears normal. No evidence of bowel wall thickening, distention, or inflammatory changes. Pancolonic diverticulosis Vascular/Lymphatic: Aortic atherosclerosis. Numerous enlarged bilateral pelvic sidewall, iliac, and retroperitoneal lymph nodes largest left retroperitoneal lymph nodes measuring up to 1.8 x 1.4 cm (series 2, image 44), left iliac nodes measuring up series 0.2 x 1.5 cm series 2, image 4) Reproductive: Prostatomegaly with indistinct margins, particularly at the prostatic base (series 6, 81). Other: No abdominal wall hernia or  abnormality. No ascites. Musculoskeletal: No acute osseous findings widespread sclerotic osseous metastatic throughout and proximal appendicular skeleton IMPRESSION: 1. Mild bilateral hydronephrosis and distended urinary bladder, consistent with bladder outlet obstruction and hydronephrosis secondary to back pressure. No urinary tract calculi. 2. Prostatomegaly with indistinct margins, particularly at the prostatic base. 3. Numerous enlarged bilateral pelvic sidewall, iliac, and retroperitoneal lymph nodes. 4. 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 5. Constellation of findings is consistent prostate malignancy and associated metastatic disease. 6. Hepatic steatosis. 7. Cholelithiasis without evidence of acute cholecystitis. 8. Pancolonic diverticulosis without evidence of acute diverticulitis. Aortic Atherosclerosis (ICD10-I70.0). Electronically Signed   By: ADelanna AhmadiM.D.   On: 09/08/2022 12:15     The results of significant diagnostics from this hospitalization (including imaging, microbiology, ancillary and laboratory) are listed below for reference.     Microbiology: Recent Results (from the past 240 hour(s))  Blood Culture (routine x 2)     Status: Abnormal   Collection  Time: 09/27/22  7:53 PM   Specimen: BLOOD  Result Value Ref Range Status   Specimen Description   Final    BLOOD LEFT FA Performed at [facility], [address] [place], [place] [address]    Special Requests   Final    BOTTLES DRAWN AEROBIC AND ANAEROBIC Blood Culture results may not be optimal due to an excessive volume of blood received in culture bottles Performed at [facility], [address] [place], [place] [address]    Culture  Setup Time   Final    GRAM NEGATIVE RODS IN BOTH AEROBIC AND ANAEROBIC BOTTLES CRITICAL RESULT CALLED TO, READ BACK BY AND VERIFIED WITH: Nat Math MERRIL 09/28/22 0745 MW Performed at [facility], [place] [address] [place], [place] [address]    Culture ESCHERICHIA COLI (A)  Final   Report Status 09/30/2022 FINAL  Final   Organism ID, Bacteria ESCHERICHIA COLI  Final      Susceptibility   Escherichia coli - MIC*    AMPICILLIN <=2 SENSITIVE Sensitive     CEFAZOLIN <=4 SENSITIVE Sensitive     CEFEPIME <=0.12 SENSITIVE Sensitive     CEFTAZIDIME <=1 SENSITIVE Sensitive     CEFTRIAXONE <=0.25 SENSITIVE Sensitive     CIPROFLOXACIN <=0.25 SENSITIVE Sensitive     GENTAMICIN <=1 SENSITIVE Sensitive     IMIPENEM <=0.25 SENSITIVE Sensitive     TRIMETH/SULFA <=20 SENSITIVE Sensitive     AMPICILLIN/SULBACTAM <=2 SENSITIVE Sensitive     PIP/TAZO <=4 SENSITIVE Sensitive     * ESCHERICHIA COLI  Blood Culture ID Panel (Reflexed)     Status: Abnormal   Collection Time: 09/27/22  7:53 PM  Result Value Ref Range Status   Enterococcus faecalis NOT DETECTED NOT DETECTED Final   Enterococcus Faecium NOT DETECTED NOT DETECTED Final   Listeria monocytogenes NOT DETECTED NOT DETECTED Final   Staphylococcus species NOT DETECTED NOT DETECTED Final   Staphylococcus aureus (BCID) NOT DETECTED NOT DETECTED Final   Staphylococcus epidermidis NOT DETECTED NOT DETECTED Final   Staphylococcus lugdunensis NOT DETECTED NOT DETECTED Final   Streptococcus species NOT DETECTED NOT DETECTED Final   Streptococcus agalactiae NOT DETECTED NOT DETECTED Final   Streptococcus pneumoniae NOT DETECTED NOT DETECTED Final   Streptococcus pyogenes NOT DETECTED NOT DETECTED Final   A.calcoaceticus-baumannii NOT DETECTED NOT DETECTED Final   Bacteroides fragilis NOT DETECTED NOT DETECTED Final   Enterobacterales DETECTED (A) NOT DETECTED Final    Comment: Enterobacterales represent a large order of gram negative bacteria, not a single organism. CRITICAL RESULT CALLED TO, READ BACK BY AND VERIFIED WITH: KRISTIAN MERRIL 09/28/22 0745 MW    Enterobacter cloacae complex NOT DETECTED NOT DETECTED Final   Escherichia coli DETECTED  (A) NOT DETECTED Final    Comment: CRITICAL RESULT CALLED TO, READ BACK BY AND VERIFIED WITH: KRISTIAN MERRIL 09/28/22 0745 MW    Klebsiella aerogenes NOT DETECTED NOT DETECTED Final   Klebsiella oxytoca NOT DETECTED NOT DETECTED Final   Klebsiella pneumoniae NOT DETECTED NOT DETECTED Final   Proteus species NOT DETECTED NOT DETECTED Final   Salmonella species NOT DETECTED NOT DETECTED Final   Serratia marcescens NOT DETECTED NOT DETECTED Final   Haemophilus influenzae NOT DETECTED NOT DETECTED Final   Neisseria meningitidis NOT DETECTED NOT DETECTED Final   Pseudomonas aeruginosa NOT DETECTED NOT DETECTED Final   Stenotrophomonas maltophilia NOT DETECTED NOT DETECTED Final   Candida albicans NOT DETECTED NOT DETECTED Final   Candida auris NOT DETECTED NOT DETECTED  Final   Candida glabrata NOT DETECTED NOT DETECTED Final   Candida krusei NOT DETECTED NOT DETECTED Final   Candida parapsilosis NOT DETECTED NOT DETECTED Final   Candida tropicalis NOT DETECTED NOT DETECTED Final   Cryptococcus neoformans/gattii NOT DETECTED NOT DETECTED Final   CTX-M ESBL NOT DETECTED NOT DETECTED Final   Carbapenem resistance IMP NOT DETECTED NOT DETECTED Final   Carbapenem resistance KPC NOT DETECTED NOT DETECTED Final   Carbapenem resistance NDM NOT DETECTED NOT DETECTED Final   Carbapenem resist OXA 48 LIKE NOT DETECTED NOT DETECTED Final   Carbapenem resistance VIM NOT DETECTED NOT DETECTED Final    Comment: Performed at [facility], [address] [place], [place] [address]  Resp Panel by RT-PCR (Flu A&B, Covid) Anterior Nasal Swab     Status: None   Collection Time: 09/27/22  7:54 PM   Specimen: Anterior Nasal Swab  Result Value Ref Range Status   SARS Coronavirus 2 by RT PCR NEGATIVE NEGATIVE Final    Comment: (NOTE) SARS-CoV-2 target nucleic acids are NOT DETECTED.  The SARS-CoV-2 RNA is generally detectable in upper respiratory specimens during the acute phase of infection. The  lowest concentration of SARS-CoV-2 viral copies this assay can detect is 138 copies/mL. A negative result does not preclude SARS-Cov-2 infection and should not be used as the sole basis for treatment or other patient management decisions. A negative result may occur with  improper specimen collection/handling, submission of specimen other than nasopharyngeal swab, presence of viral mutation(s) within the areas targeted by this assay, and inadequate number of viral copies(<138 copies/mL). A negative result must be combined with clinical observations, patient history, and epidemiological information. The expected result is Negative.  Fact Sheet for Patients:  EntrepreneurPulse.com.au  Fact Sheet for Healthcare Providers:  IncredibleEmployment.be  This test is no t yet approved or cleared by the [place] FDA and  has been authorized for detection and/or diagnosis of SARS-CoV-2 by FDA under an Emergency Use Authorization (EUA). This EUA will remain  in effect (meaning this test can be used) for the duration of the COVID-19 declaration under Section 564(b)(1) of the Act, 21 U.S.C.section 360bbb-3(b)(1), unless the authorization is terminated  or revoked sooner.       Influenza A by PCR NEGATIVE NEGATIVE Final   Influenza B by PCR NEGATIVE NEGATIVE Final    Comment: (NOTE) The Xpert Xpress SARS-CoV-2/FLU/RSV plus assay is intended as an aid in the diagnosis of influenza from Nasopharyngeal swab specimens and should not be used as a sole basis for treatment. Nasal washings and aspirates are unacceptable for Xpert Xpress SARS-CoV-2/FLU/RSV testing.  Fact Sheet for Patients: EntrepreneurPulse.com.au  Fact Sheet for Healthcare Providers: IncredibleEmployment.be  This test is not yet approved or cleared by the [place] FDA and has been authorized for detection and/or diagnosis of SARS-CoV-2 by FDA under  an Emergency Use Authorization (EUA). This EUA will remain in effect (meaning this test can be used) for the duration of the COVID-19 declaration under Section 564(b)(1) of the Act, 21 U.S.C. section 360bbb-3(b)(1), unless the authorization is terminated or revoked.  Performed at [facility], [address] [place], [place] [address]   Blood Culture (routine x 2)     Status: Abnormal   Collection Time: 09/27/22  7:54 PM   Specimen: BLOOD  Result Value Ref Range Status   Specimen Description   Final    BLOOD RIGHT FA Performed at [facility], [address] [place], [place] [address]  Special Requests   Final    BOTTLES DRAWN AEROBIC AND ANAEROBIC Blood Culture results may not be optimal due to an excessive volume of blood received in culture bottles Performed at [facility], [address] [place], [place] [address]    Culture  Setup Time   Final    GRAM NEGATIVE RODS IN BOTH AEROBIC AND ANAEROBIC BOTTLES CRITICAL RESULT CALLED TO, READ BACK BY AND VERIFIED WITH: Nat Math MERRIL 09/28/22 0745 MW Performed at [facility], [address] [place], [place] [address]    Culture (A)  Final    ESCHERICHIA COLI SUSCEPTIBILITIES PERFORMED ON PREVIOUS CULTURE WITHIN THE LAST 5 DAYS. Performed at [facility], [place] [address], [place], [place] [address]    Report Status 09/30/2022 FINAL  Final  Urine Culture     Status: Abnormal   Collection Time: 09/27/22  9:19 PM   Specimen: In/Out Cath Urine  Result Value Ref Range Status   Specimen Description   Final    IN/OUT CATH URINE Performed at [facility], [address] [place], [place] [address]    Special Requests   Final    NONE Performed at [facility], [address] [place],  [address]    Culture (A)  Final    >=100,000 COLONIES/mL ENTEROCOCCUS FAECALIS >=100,000 COLONIES/mL KLEBSIELLA OXYTOCA >=100,000 COLONIES/mL ESCHERICHIA COLI    Report  Status 09/30/2022 FINAL  Final   Organism ID, Bacteria ENTEROCOCCUS FAECALIS (A)  Final   Organism ID, Bacteria KLEBSIELLA OXYTOCA (A)  Final   Organism ID, Bacteria ESCHERICHIA COLI (A)  Final      Susceptibility   Escherichia coli - MIC*    AMPICILLIN <=2 SENSITIVE Sensitive     CEFAZOLIN <=4 SENSITIVE Sensitive     CEFEPIME <=0.12 SENSITIVE Sensitive     CEFTRIAXONE <=0.25 SENSITIVE Sensitive     CIPROFLOXACIN <=0.25 SENSITIVE Sensitive     GENTAMICIN <=1 SENSITIVE Sensitive     IMIPENEM <=0.25 SENSITIVE Sensitive     NITROFURANTOIN <=16 SENSITIVE Sensitive     TRIMETH/SULFA <=20 SENSITIVE Sensitive     AMPICILLIN/SULBACTAM <=2 SENSITIVE Sensitive     PIP/TAZO <=4 SENSITIVE Sensitive     * >=100,000 COLONIES/mL ESCHERICHIA COLI   Enterococcus faecalis - MIC*    AMPICILLIN <=2 SENSITIVE Sensitive     NITROFURANTOIN <=16 SENSITIVE Sensitive     VANCOMYCIN 1 SENSITIVE Sensitive     * >=100,000 COLONIES/mL ENTEROCOCCUS FAECALIS   Klebsiella oxytoca - MIC*    AMPICILLIN >=32 RESISTANT Resistant     CEFAZOLIN 8 SENSITIVE Sensitive     CEFEPIME <=0.12 SENSITIVE Sensitive     CEFTRIAXONE <=0.25 SENSITIVE Sensitive     CIPROFLOXACIN <=0.25 SENSITIVE Sensitive     GENTAMICIN <=1 SENSITIVE Sensitive     IMIPENEM <=0.25 SENSITIVE Sensitive     NITROFURANTOIN <=16 SENSITIVE Sensitive     TRIMETH/SULFA <=20 SENSITIVE Sensitive     AMPICILLIN/SULBACTAM 8 SENSITIVE Sensitive     PIP/TAZO <=4 SENSITIVE Sensitive     * >=100,000 COLONIES/mL KLEBSIELLA OXYTOCA  MRSA Next Gen by PCR, Nasal     Status: None   Collection Time: 09/28/22  3:00 AM   Specimen: Nasal Mucosa; Nasal Swab  Result Value Ref Range Status   MRSA by PCR Next Gen NOT DETECTED NOT DETECTED Final    Comment: (NOTE) The GeneXpert MRSA Assay (FDA approved for NASAL specimens only), is one component of a comprehensive MRSA colonization surveillance program. It is not intended to  diagnose MRSA infection nor to guide or  monitor treatment for MRSA infections. Test performance is not FDA approved in patients less than 38 years old. Performed at [facility], [address] [place], [place] [address]   Culture, blood (Routine X 2) w Reflex to ID Panel     Status: None   Collection Time: 09/29/22  2:10 PM   Specimen: BLOOD RIGHT HAND  Result Value Ref Range Status   Specimen Description BLOOD RIGHT HAND  Final   Special Requests   Final    BOTTLES DRAWN AEROBIC AND ANAEROBIC Blood Culture adequate volume   Culture   Final    NO GROWTH 5 DAYS Performed at [facility], [address] [place], [place] [address]    Report Status 10/04/2022 FINAL  Final  Culture, blood (Routine X 2) w Reflex to ID Panel     Status: None   Collection Time: 09/29/22  2:15 PM   Specimen: Right Antecubital; Blood  Result Value Ref Range Status   Specimen Description RIGHT ANTECUBITAL  Final   Special Requests   Final    BOTTLES DRAWN AEROBIC AND ANAEROBIC Blood Culture adequate volume   Culture   Final    NO GROWTH 5 DAYS Performed at [facility], [address] [place], [place] [address]    Report Status 10/04/2022 FINAL  Final     Labs: BNP (last 3 results) Recent Labs    12/17/21 1005  BNP 74.2   Basic Metabolic Panel: Recent Labs  Lab 09/30/22 0448 10/01/22 0520 10/02/22 0608 10/03/22 0621 10/04/22 0347 10/05/22 1058 10/06/22 0540  NA 137 137 139 142 137 135 138  K 4.2 4.0 3.8 3.9 4.1 4.6 4.5  CL 106 108 108 111 109 107 108  CO2 17* 22 19* '24 24 24 25  '$ GLUCOSE 200* 157* 121* 123* 81 197* 147*  BUN 72* 79* 71* 54* 40* 26* 25*  CREATININE 2.55* 2.25* 1.93* 1.56* 1.34* 1.23 1.21  CALCIUM 8.4* 8.1* 8.5* 8.3* 7.9* 7.8* 7.9*  MG 2.1 2.1 2.1 1.9 1.7  --   --   PHOS  --   --  2.6 2.3*  --   --   --    Liver Function Tests: Recent Labs  Lab 10/04/22 0347 10/05/22 1058 10/06/22 0540  AST 18 17 12*  ALT '12 12 11  '$ ALKPHOS 157* 196* 208*  BILITOT 0.8 0.9 0.9  PROT  5.5* 6.2* 6.0*  ALBUMIN 2.0* 2.2* 2.1*   No results for input(s): "LIPASE", "AMYLASE" in the last 168 hours. No results for input(s): "AMMONIA" in the last 168 hours. CBC: Recent Labs  Lab 10/01/22 0520 10/01/22 0940 10/03/22 0621 10/04/22 0347 10/04/22 1833 10/05/22 1058 10/06/22 0540  WBC 4.9  --  11.5* 14.6*  --  18.6* 20.6*  HGB 7.4*   < > 7.5* 6.9* 8.0* 8.1* 8.1*  HCT 22.2*  --  22.6* 20.7* 23.9* 24.2* 24.4*  MCV 97.4  --  99.1 99.5  --  95.7 96.1  PLT 67*  --  102* 127*  --  167 193   < > = values in this interval not displayed.   Cardiac Enzymes: No results for input(s): "CKTOTAL", "CKMB", "CKMBINDEX", "TROPONINI" in the last 168 hours. BNP: Invalid input(s): "POCBNP" CBG: Recent Labs  Lab 10/05/22 1211 10/05/22 1654 10/05/22 2057 10/06/22 0822 10/06/22 1147  GLUCAP 241* 203* 196* 151* 162*   D-Dimer No results for input(s): "DDIMER" in the last 72 hours. Hgb  A1c No results for input(s): "HGBA1C" in the last 72 hours. Lipid Profile No results for input(s): "CHOL", "HDL", "LDLCALC", "TRIG", "CHOLHDL", "LDLDIRECT" in the last 72 hours. Thyroid function studies No results for input(s): "TSH", "T4TOTAL", "T3FREE", "THYROIDAB" in the last 72 hours.  Invalid input(s): "FREET3" Anemia work up No results for input(s): "VITAMINB12", "FOLATE", "FERRITIN", "TIBC", "IRON", "RETICCTPCT" in the last 72 hours. Urinalysis    Component Value Date/Time   COLORURINE AMBER (A) 09/27/2022 2119   APPEARANCEUR TURBID (A) 09/27/2022 2119   LABSPEC 1.017 09/27/2022 2119   PHURINE 5.0 09/27/2022 2119   GLUCOSEU NEGATIVE 09/27/2022 2119   HGBUR MODERATE (A) 09/27/2022 2119   BILIRUBINUR NEGATIVE 09/27/2022 2119   KETONESUR NEGATIVE 09/27/2022 2119   PROTEINUR 100 (A) 09/27/2022 2119   NITRITE POSITIVE (A) 09/27/2022 2119   LEUKOCYTESUR MODERATE (A) 09/27/2022 2119   Sepsis Labs Recent Labs  Lab 10/03/22 0621 10/04/22 0347 10/05/22 1058 10/06/22 0540  WBC 11.5* 14.6*  18.6* 20.6*   Microbiology Recent Results (from the past 240 hour(s))  Blood Culture (routine x 2)     Status: Abnormal   Collection Time: 09/27/22  7:53 PM   Specimen: BLOOD  Result Value Ref Range Status   Specimen Description   Final    BLOOD LEFT FA Performed at [facility], [address], [place], [place] [address]    Special Requests   Final    BOTTLES DRAWN AEROBIC AND ANAEROBIC Blood Culture results may not be optimal due to an excessive volume of blood received in culture bottles Performed at [facility], [address], [place], [place] [address]    Culture  Setup Time   Final    GRAM NEGATIVE RODS IN BOTH AEROBIC AND ANAEROBIC BOTTLES CRITICAL RESULT CALLED TO, READ BACK BY AND VERIFIED WITH: Nat Math MERRIL 09/28/22 0745 MW Performed at [facility], [place] [address] [place], [place] [address]    Culture ESCHERICHIA COLI (A)  Final   Report Status 09/30/2022 FINAL  Final   Organism ID, Bacteria ESCHERICHIA COLI  Final      Susceptibility   Escherichia coli - MIC*    AMPICILLIN <=2 SENSITIVE Sensitive     CEFAZOLIN <=4 SENSITIVE Sensitive     CEFEPIME <=0.12 SENSITIVE Sensitive     CEFTAZIDIME <=1 SENSITIVE Sensitive     CEFTRIAXONE <=0.25 SENSITIVE Sensitive     CIPROFLOXACIN <=0.25 SENSITIVE Sensitive     GENTAMICIN <=1 SENSITIVE Sensitive     IMIPENEM <=0.25 SENSITIVE Sensitive     TRIMETH/SULFA <=20 SENSITIVE Sensitive     AMPICILLIN/SULBACTAM <=2 SENSITIVE Sensitive     PIP/TAZO <=4 SENSITIVE Sensitive     * ESCHERICHIA COLI  Blood Culture ID Panel (Reflexed)     Status: Abnormal   Collection Time: 09/27/22  7:53 PM  Result Value Ref Range Status   Enterococcus faecalis NOT DETECTED NOT DETECTED Final   Enterococcus Faecium NOT DETECTED NOT DETECTED Final   Listeria monocytogenes NOT DETECTED NOT DETECTED Final   Staphylococcus species NOT DETECTED NOT DETECTED Final   Staphylococcus aureus (BCID) NOT DETECTED NOT DETECTED Final    Staphylococcus epidermidis NOT DETECTED NOT DETECTED Final   Staphylococcus lugdunensis NOT DETECTED NOT DETECTED Final   Streptococcus species NOT DETECTED NOT DETECTED Final   Streptococcus agalactiae NOT DETECTED NOT DETECTED Final   Streptococcus pneumoniae NOT DETECTED NOT DETECTED Final   Streptococcus pyogenes NOT DETECTED NOT DETECTED Final   A.calcoaceticus-baumannii NOT DETECTED NOT DETECTED Final   Bacteroides fragilis  NOT DETECTED NOT DETECTED Final   Enterobacterales DETECTED (A) NOT DETECTED Final    Comment: Enterobacterales represent a large order of gram negative bacteria, not a single organism. CRITICAL RESULT CALLED TO, READ BACK BY AND VERIFIED WITH: KRISTIAN MERRIL 09/28/22 0745 MW    Enterobacter cloacae complex NOT DETECTED NOT DETECTED Final   Escherichia coli DETECTED (A) NOT DETECTED Final    Comment: CRITICAL RESULT CALLED TO, READ BACK BY AND VERIFIED WITH: KRISTIAN MERRIL 09/28/22 0745 MW    Klebsiella aerogenes NOT DETECTED NOT DETECTED Final   Klebsiella oxytoca NOT DETECTED NOT DETECTED Final   Klebsiella pneumoniae NOT DETECTED NOT DETECTED Final   Proteus species NOT DETECTED NOT DETECTED Final   Salmonella species NOT DETECTED NOT DETECTED Final   Serratia marcescens NOT DETECTED NOT DETECTED Final   Haemophilus influenzae NOT DETECTED NOT DETECTED Final   Neisseria meningitidis NOT DETECTED NOT DETECTED Final   Pseudomonas aeruginosa NOT DETECTED NOT DETECTED Final   Stenotrophomonas maltophilia NOT DETECTED NOT DETECTED Final   Candida albicans NOT DETECTED NOT DETECTED Final   Candida auris NOT DETECTED NOT DETECTED Final   Candida glabrata NOT DETECTED NOT DETECTED Final   Candida krusei NOT DETECTED NOT DETECTED Final   Candida parapsilosis NOT DETECTED NOT DETECTED Final   Candida tropicalis NOT DETECTED NOT DETECTED Final   Cryptococcus neoformans/gattii NOT DETECTED NOT DETECTED Final   CTX-M ESBL NOT DETECTED NOT DETECTED Final    Carbapenem resistance IMP NOT DETECTED NOT DETECTED Final   Carbapenem resistance KPC NOT DETECTED NOT DETECTED Final   Carbapenem resistance NDM NOT DETECTED NOT DETECTED Final   Carbapenem resist OXA 48 LIKE NOT DETECTED NOT DETECTED Final   Carbapenem resistance VIM NOT DETECTED NOT DETECTED Final    Comment: Performed at [facility], [address] [place],  [address]  Resp Panel by RT-PCR (Flu A&B, Covid) Anterior Nasal Swab     Status: None   Collection Time: 09/27/22  7:54 PM   Specimen: Anterior Nasal Swab  Result Value Ref Range Status   SARS Coronavirus 2 by RT PCR NEGATIVE NEGATIVE Final    Comment: (NOTE) SARS-CoV-2 target nucleic acids are NOT DETECTED.  The SARS-CoV-2 RNA is generally detectable in upper respiratory specimens during the acute phase of infection. The lowest concentration of SARS-CoV-2 viral copies this assay can detect is 138 copies/mL. A negative result does not preclude SARS-Cov-2 infection and should not be used as the sole basis for treatment or other patient management decisions. A negative result may occur with  improper specimen collection/handling, submission of specimen other than nasopharyngeal swab, presence of viral mutation(s) within the areas targeted by this assay, and inadequate number of viral copies(<138 copies/mL). A negative result must be combined with clinical observations, patient history, and epidemiological information. The expected result is Negative.  Fact Sheet for Patients:  EntrepreneurPulse.com.au  Fact Sheet for Healthcare Providers:  IncredibleEmployment.be  This test is no t yet approved or cleared by the [place] FDA and  has been authorized for detection and/or diagnosis of SARS-CoV-2 by FDA under an Emergency Use Authorization (EUA). This EUA will remain  in effect (meaning this test can be used) for the duration of the COVID-19 declaration under Section  564(b)(1) of the Act, 21 U.S.C.section 360bbb-3(b)(1), unless the authorization is terminated  or revoked sooner.       Influenza A by PCR NEGATIVE NEGATIVE Final   Influenza B by PCR NEGATIVE NEGATIVE Final    Comment: (NOTE)  The Xpert Xpress SARS-CoV-2/FLU/RSV plus assay is intended as an aid in the diagnosis of influenza from Nasopharyngeal swab specimens and should not be used as a sole basis for treatment. Nasal washings and aspirates are unacceptable for Xpert Xpress SARS-CoV-2/FLU/RSV testing.  Fact Sheet for Patients: EntrepreneurPulse.com.au  Fact Sheet for Healthcare Providers: IncredibleEmployment.be  This test is not yet approved or cleared by the [place] FDA and has been authorized for detection and/or diagnosis of SARS-CoV-2 by FDA under an Emergency Use Authorization (EUA). This EUA will remain in effect (meaning this test can be used) for the duration of the COVID-19 declaration under Section 564(b)(1) of the Act, 21 U.S.C. section 360bbb-3(b)(1), unless the authorization is terminated or revoked.  Performed at [facility], [address], [place], [place] [address]   Blood Culture (routine x 2)     Status: Abnormal   Collection Time: 09/27/22  7:54 PM   Specimen: BLOOD  Result Value Ref Range Status   Specimen Description   Final    BLOOD RIGHT FA Performed at [facility], [address], [place], [place] [address]    Special Requests   Final    BOTTLES DRAWN AEROBIC AND ANAEROBIC Blood Culture results may not be optimal due to an excessive volume of blood received in culture bottles Performed at [facility], [address], [place], [place] [address]    Culture  Setup Time   Final    GRAM NEGATIVE RODS IN BOTH AEROBIC AND ANAEROBIC BOTTLES CRITICAL RESULT CALLED TO, READ BACK BY AND VERIFIED WITH: Nat Math MERRIL 09/28/22 0745 MW Performed at [facility], [address] [place], [place] [address]    Culture (A)  Final    ESCHERICHIA COLI SUSCEPTIBILITIES PERFORMED ON PREVIOUS CULTURE WITHIN THE LAST 5 DAYS. Performed at [facility], [place] [address] [place], [place] [address]    Report Status 09/30/2022 FINAL  Final  Urine Culture     Status: Abnormal   Collection Time: 09/27/22  9:19 PM   Specimen: In/Out Cath Urine  Result Value Ref Range Status   Specimen Description   Final    IN/OUT CATH URINE Performed at [facility], [address] [place], [place] [address]    Special Requests   Final    NONE Performed at [facility], [address] [place], [place] [address]    Culture (A)  Final    >=100,000 COLONIES/mL ENTEROCOCCUS FAECALIS >=100,000 COLONIES/mL KLEBSIELLA OXYTOCA >=100,000 COLONIES/mL ESCHERICHIA COLI    Report Status 09/30/2022 FINAL  Final   Organism ID, Bacteria ENTEROCOCCUS FAECALIS (A)  Final   Organism ID, Bacteria KLEBSIELLA OXYTOCA (A)  Final   Organism ID, Bacteria ESCHERICHIA COLI (A)  Final      Susceptibility   Escherichia coli - MIC*    AMPICILLIN <=2 SENSITIVE Sensitive     CEFAZOLIN <=4 SENSITIVE Sensitive     CEFEPIME <=0.12 SENSITIVE Sensitive     CEFTRIAXONE <=0.25 SENSITIVE Sensitive     CIPROFLOXACIN <=0.25 SENSITIVE Sensitive     GENTAMICIN <=1 SENSITIVE Sensitive     IMIPENEM <=0.25 SENSITIVE Sensitive     NITROFURANTOIN <=16 SENSITIVE Sensitive     TRIMETH/SULFA <=20 SENSITIVE Sensitive     AMPICILLIN/SULBACTAM <=2 SENSITIVE Sensitive     PIP/TAZO <=4 SENSITIVE Sensitive     * >=100,000 COLONIES/mL ESCHERICHIA COLI   Enterococcus faecalis - MIC*    AMPICILLIN <=2 SENSITIVE Sensitive     NITROFURANTOIN <=16 SENSITIVE Sensitive  VANCOMYCIN 1 SENSITIVE Sensitive     * >=100,000 COLONIES/mL ENTEROCOCCUS FAECALIS   Klebsiella oxytoca - MIC*    AMPICILLIN >=32 RESISTANT Resistant     CEFAZOLIN 8 SENSITIVE Sensitive     CEFEPIME <=0.12 SENSITIVE Sensitive      CEFTRIAXONE <=0.25 SENSITIVE Sensitive     CIPROFLOXACIN <=0.25 SENSITIVE Sensitive     GENTAMICIN <=1 SENSITIVE Sensitive     IMIPENEM <=0.25 SENSITIVE Sensitive     NITROFURANTOIN <=16 SENSITIVE Sensitive     TRIMETH/SULFA <=20 SENSITIVE Sensitive     AMPICILLIN/SULBACTAM 8 SENSITIVE Sensitive     PIP/TAZO <=4 SENSITIVE Sensitive     * >=100,000 COLONIES/mL KLEBSIELLA OXYTOCA  MRSA Next Gen by PCR, Nasal     Status: None   Collection Time: 09/28/22  3:00 AM   Specimen: Nasal Mucosa; Nasal Swab  Result Value Ref Range Status   MRSA by PCR Next Gen NOT DETECTED NOT DETECTED Final    Comment: (NOTE) The GeneXpert MRSA Assay (FDA approved for NASAL specimens only), is one component of a comprehensive MRSA colonization surveillance program. It is not intended to diagnose MRSA infection nor to guide or monitor treatment for MRSA infections. Test performance is not FDA approved in patients less than 78 years old. Performed at [facility], [address] [place], [place] [address]   Culture, blood (Routine X 2) w Reflex to ID Panel     Status: None   Collection Time: 09/29/22  2:10 PM   Specimen: BLOOD RIGHT HAND  Result Value Ref Range Status   Specimen Description BLOOD RIGHT HAND  Final   Special Requests   Final    BOTTLES DRAWN AEROBIC AND ANAEROBIC Blood Culture adequate volume   Culture   Final    NO GROWTH 5 DAYS Performed at [facility], [address], [place], [place] [address]    Report Status 10/04/2022 FINAL  Final  Culture, blood (Routine X 2) w Reflex to ID Panel     Status: None   Collection Time: 09/29/22  2:15 PM   Specimen: Right Antecubital; Blood  Result Value Ref Range Status   Specimen Description RIGHT ANTECUBITAL  Final   Special Requests   Final    BOTTLES DRAWN AEROBIC AND ANAEROBIC Blood Culture adequate volume   Culture   Final    NO GROWTH 5 DAYS Performed at [facility], [address], [place], [place]  [address]    Report Status 10/04/2022 FINAL  Final     Time coordinating discharge:  I have spent 35 minutes face to face with the patient and on the ward discussing the patients care, assessment, plan and disposition with other care givers. >50% of the time was devoted counseling the patient about the risks and benefits of treatment/Discharge disposition and coordinating care.   SIGNED:   Damita Lack, MD  [facility] 10/06/2022, 3:55 PM   If 7PM-7AM, please contact night-coverage

## 2022-10-06 NOTE — TOC Progression Note (Signed)
Transition of Care [facility]) - Progression Note    Patient Details  Name: Roger [name] MRN: [id number] Date of Birth: 07-01-1956  Transition of Care [facility]) CM/SW [place], [place] Phone Number: 10/06/2022, 4:19 PM  Clinical Narrative:     CSW spoke with pt via phone in reference to concerns he had with SNF choice being in [place]. CSW explained that due to pt needs and insurance, there were very few bed offers and non in the [place] area. Pt was upset that his son was able to make a choice on a SNF and not him, CSW explained that pt was not A&O to make the choice. During the conversation pt continued to wax and wane, stating he wanted to go to work instead of SNF, asking if he could go part time, etc. Pt initially refused to go to SN, stating he would rather go home. CSW was able to speak with pt's sister and gf who convinced pt that he needed to go to SNF. Pt eventually agreed.        Expected Discharge Plan and Services           Expected Discharge Date: 10/06/22                                     Social Determinants of Health (SDOH) Interventions    Readmission Risk Interventions     No data to display

## 2022-10-06 NOTE — Progress Notes (Signed)
Patient will discharge to [facility] via EMS, family is aware. All belongings gathered. IV removed. Discharge instruction given to patient, all questions answered. Foley remains in place. Nurse will call report.

## 2022-10-06 NOTE — Plan of Care (Signed)
  Problem: Education: Goal: Ability to describe self-care measures that may prevent or decrease complications (Diabetes Survival Skills Education) will improve Outcome: Adequate for Discharge Goal: Individualized Educational Video(s) Outcome: Adequate for Discharge   Problem: Coping: Goal: Ability to adjust to condition or change in health will improve Outcome: Adequate for Discharge   Problem: Fluid Volume: Goal: Ability to maintain a balanced intake and output will improve Outcome: Adequate for Discharge   Problem: Health Behavior/Discharge Planning: Goal: Ability to identify and utilize available resources and services will improve Outcome: Adequate for Discharge Goal: Ability to manage health-related needs will improve Outcome: Adequate for Discharge   Problem: Metabolic: Goal: Ability to maintain appropriate glucose levels will improve Outcome: Adequate for Discharge   Problem: Nutritional: Goal: Maintenance of adequate nutrition will improve Outcome: Adequate for Discharge Goal: Progress toward achieving an optimal weight will improve Outcome: Adequate for Discharge   Problem: Skin Integrity: Goal: Risk for impaired skin integrity will decrease Outcome: Adequate for Discharge   Problem: Tissue Perfusion: Goal: Adequacy of tissue perfusion will improve Outcome: Adequate for Discharge   Problem: Fluid Volume: Goal: Hemodynamic stability will improve Outcome: Adequate for Discharge   Problem: Clinical Measurements: Goal: Diagnostic test results will improve Outcome: Adequate for Discharge Goal: Signs and symptoms of infection will decrease Outcome: Adequate for Discharge   Problem: Respiratory: Goal: Ability to maintain adequate ventilation will improve Outcome: Adequate for Discharge   Problem: Education: Goal: Knowledge of General Education information will improve Description: Including pain rating scale, medication(s)/side effects and non-pharmacologic  comfort measures Outcome: Adequate for Discharge   Problem: Health Behavior/Discharge Planning: Goal: Ability to manage health-related needs will improve Outcome: Adequate for Discharge   Problem: Clinical Measurements: Goal: Ability to maintain clinical measurements within normal limits will improve Outcome: Adequate for Discharge Goal: Will remain free from infection Outcome: Adequate for Discharge Goal: Diagnostic test results will improve Outcome: Adequate for Discharge Goal: Respiratory complications will improve Outcome: Adequate for Discharge Goal: Cardiovascular complication will be avoided Outcome: Adequate for Discharge   Problem: Coping: Goal: Level of anxiety will decrease Outcome: Adequate for Discharge   Problem: Elimination: Goal: Will not experience complications related to bowel motility Outcome: Adequate for Discharge Goal: Will not experience complications related to urinary retention Outcome: Adequate for Discharge   Problem: Pain Managment: Goal: General experience of comfort will improve Outcome: Adequate for Discharge   Problem: Safety: Goal: Ability to remain free from injury will improve Outcome: Adequate for Discharge   Problem: Skin Integrity: Goal: Risk for impaired skin integrity will decrease Outcome: Adequate for Discharge

## 2022-10-06 NOTE — TOC Transition Note (Signed)
Transition of Care [facility]) - CM/SW Discharge Note   Patient Details  Name: ZADKIEL DRAGAN MRN: [id number] Date of Birth: 10/28/1956  [facility]) CM/SW Contact:  Loreta Ave, [place] Phone Number: 10/06/2022, 4:27 PM   Clinical Narrative:     Patient will DC to: [place] [facility] [place] Anticipated DC date:  10/06/22 Family notified: Sister-Faye Transport by: Johnanna Schneiders   Per MD patient ready for DC to [facility] . RN to call report prior to discharge [phone]. RN, patient, patient's family, and facility notified of DC. Discharge Summary and FL2 sent to facility. DC packet on chart. Ambulance transport requested for patient.   CSW will sign off for now as social work intervention is no longer needed. Please consult [place] again if new needs arise.          Patient Goals and CMS Choice        Discharge Placement                       Discharge Plan and Services                                     Social Determinants of Health (SDOH) Interventions     Readmission Risk Interventions     No data to display

## 2022-10-06 NOTE — Progress Notes (Signed)
Patient had concerns about discharge and the facility that he will be going to. Provider and social worker notified and asked if they could speak with patient,

## 2022-10-06 NOTE — TOC Transition Note (Signed)
Transition of Care [facility]) - CM/SW Discharge Note   Patient Details  Name: Roger [name] MRN: [id number] Date of Birth: May 13, 1956  Transition of Care [facility]) CM/SW Contact:  Loreta Ave, [place] Phone Number: 10/06/2022, 2:16 PM   Clinical Narrative:    Patient will DC to: [place] Anticipated DC date: 10/06/22 Family notified: attempted to call both of pt's sons, neither answered.  Transport by:   Per MD patient ready for DC to  [facility] . RN to call report prior to discharge [phone]. RN, patient, patient's family, and facility notified of DC. Discharge Summary and FL2 sent to facility. DC packet on chart. Ambulance transport requested for patient.   CSW will sign off for now as social work intervention is no longer needed. Please consult [place] again if new needs arise.           Patient Goals and CMS Choice        Discharge Placement                       Discharge Plan and Services                                     Social Determinants of Health (SDOH) Interventions     Readmission Risk Interventions     No data to display

## 2022-10-06 NOTE — Plan of Care (Signed)
  Problem: Education: Goal: Ability to describe self-care measures that may prevent or decrease complications (Diabetes Survival Skills Education) will improve Outcome: Progressing Goal: Individualized Educational Video(s) Outcome: Progressing   Problem: Fluid Volume: Goal: Ability to maintain a balanced intake and output will improve Outcome: Progressing   Problem: Skin Integrity: Goal: Risk for impaired skin integrity will decrease Outcome: Progressing   Problem: Tissue Perfusion: Goal: Adequacy of tissue perfusion will improve Outcome: Progressing   Problem: Fluid Volume: Goal: Hemodynamic stability will improve Outcome: Progressing   Problem: Education: Goal: Ability to describe self-care measures that may prevent or decrease complications (Diabetes Survival Skills Education) will improve Outcome: Progressing Goal: Individualized Educational Video(s) Outcome: Progressing   Problem: Fluid Volume: Goal: Ability to maintain a balanced intake and output will improve Outcome: Progressing   Problem: Skin Integrity: Goal: Risk for impaired skin integrity will decrease Outcome: Progressing   Problem: Tissue Perfusion: Goal: Adequacy of tissue perfusion will improve Outcome: Progressing   Problem: Fluid Volume: Goal: Hemodynamic stability will improve Outcome: Progressing

## 2022-10-10 ENCOUNTER — Telehealth: Payer: Self-pay

## 2022-10-10 NOTE — Telephone Encounter (Signed)
Scheduled a peer to peer.

## 2022-10-10 NOTE — Telephone Encounter (Signed)
Refaxed appeal letter and office notes.

## 2022-10-15 NOTE — Telephone Encounter (Signed)
Spoke with [employer].  [employer] appeal is still processing.  Workpass ID  [id number].  CVK#[id number].

## 2022-10-15 NOTE — Telephone Encounter (Signed)
Please follow-up on this ASAP.  I would like him to try to receive medication before Thanksgiving holiday.

## 2022-10-16 ENCOUNTER — Other Ambulatory Visit: Payer: Self-pay | Admitting: Student

## 2022-10-16 DIAGNOSIS — C61 Malignant neoplasm of prostate: Secondary | ICD-10-CM

## 2022-10-17 ENCOUNTER — Emergency Department
Admission: EM | Admit: 2022-10-17 | Discharge: 2022-10-17 | Disposition: A | Discharge status: Skilled Nursing Facility | Payer: 59 | Attending: Emergency Medicine | Admitting: Emergency Medicine

## 2022-10-17 ENCOUNTER — Other Ambulatory Visit: Payer: Self-pay

## 2022-10-17 ENCOUNTER — Emergency Department: Payer: 59

## 2022-10-17 ENCOUNTER — Encounter: Payer: Self-pay | Admitting: Emergency Medicine

## 2022-10-17 ENCOUNTER — Ambulatory Visit
Admission: RE | Admit: 2022-10-17 | Discharge: 2022-10-17 | Disposition: A | Discharge status: Home / Self Care | Payer: 59 | Source: Ambulatory Visit | Attending: Urology | Admitting: Urology

## 2022-10-17 VITALS — BP 122/88 | HR 114 | Temp 97.9°F | Resp 25 | Ht 70.0 in | Wt 212.0 lb

## 2022-10-17 DIAGNOSIS — C7951 Secondary malignant neoplasm of bone: Secondary | ICD-10-CM | POA: Insufficient documentation

## 2022-10-17 DIAGNOSIS — Z8546 Personal history of malignant neoplasm of prostate: Secondary | ICD-10-CM | POA: Diagnosis not present

## 2022-10-17 DIAGNOSIS — C61 Malignant neoplasm of prostate: Secondary | ICD-10-CM | POA: Insufficient documentation

## 2022-10-17 DIAGNOSIS — R079 Chest pain, unspecified: Secondary | ICD-10-CM | POA: Insufficient documentation

## 2022-10-17 DIAGNOSIS — M109 Gout, unspecified: Secondary | ICD-10-CM | POA: Insufficient documentation

## 2022-10-17 LAB — CBC WITH DIFFERENTIAL/PLATELET
Abs Immature Granulocytes: 0.99 10*3/uL — ABNORMAL HIGH (ref 0.00–0.07)
Basophils Absolute: 0.1 10*3/uL (ref 0.0–0.1)
Basophils Relative: 0 %
Eosinophils Absolute: 0.1 10*3/uL (ref 0.0–0.5)
Eosinophils Relative: 1 %
HCT: 26.1 % — ABNORMAL LOW (ref 39.0–52.0)
Hemoglobin: 8.4 g/dL — ABNORMAL LOW (ref 13.0–17.0)
Immature Granulocytes: 5 %
Lymphocytes Relative: 7 %
Lymphs Abs: 1.3 10*3/uL (ref 0.7–4.0)
MCH: 30.9 pg (ref 26.0–34.0)
MCHC: 32.2 g/dL (ref 30.0–36.0)
MCV: 96 fL (ref 80.0–100.0)
Monocytes Absolute: 0.8 10*3/uL (ref 0.1–1.0)
Monocytes Relative: 4 %
Neutro Abs: 16.7 10*3/uL — ABNORMAL HIGH (ref 1.7–7.7)
Neutrophils Relative %: 83 %
Platelets: 298 10*3/uL (ref 150–400)
RBC: 2.72 MIL/uL — ABNORMAL LOW (ref 4.22–5.81)
RDW: 15 % (ref 11.5–15.5)
WBC: 19.9 10*3/uL — ABNORMAL HIGH (ref 4.0–10.5)
nRBC: 0.1 % (ref 0.0–0.2)

## 2022-10-17 LAB — COMPREHENSIVE METABOLIC PANEL
ALT: 15 U/L (ref 0–44)
AST: 17 U/L (ref 15–41)
Albumin: 2.6 g/dL — ABNORMAL LOW (ref 3.5–5.0)
Alkaline Phosphatase: 297 U/L — ABNORMAL HIGH (ref 38–126)
Anion gap: 8 (ref 5–15)
BUN: 18 mg/dL (ref 8–23)
CO2: 26 mmol/L (ref 22–32)
Calcium: 8.7 mg/dL — ABNORMAL LOW (ref 8.9–10.3)
Chloride: 97 mmol/L — ABNORMAL LOW (ref 98–111)
Creatinine, Ser: 1.03 mg/dL (ref 0.61–1.24)
GFR, Estimated: >60 mL/min (ref >60)
Glucose, Bld: 166 mg/dL — ABNORMAL HIGH (ref 70–99)
Potassium: 4.2 mmol/L (ref 3.5–5.1)
Sodium: 131 mmol/L — ABNORMAL LOW (ref 135–145)
Total Bilirubin: 0.7 mg/dL (ref 0.3–1.2)
Total Protein: 7.3 g/dL (ref 6.5–8.1)

## 2022-10-17 LAB — TROPONIN I (HIGH SENSITIVITY)
Troponin I (High Sensitivity): 13 ng/L (ref <18)
Troponin I (High Sensitivity): 11 ng/L (ref <18)

## 2022-10-17 LAB — GLUCOSE, CAPILLARY: Glucose-Capillary: 177 mg/dL — ABNORMAL HIGH (ref 70–99)

## 2022-10-17 LAB — PROTIME-INR
INR: 1.2 (ref 0.8–1.2)
Prothrombin Time: 15 seconds (ref 11.4–15.2)

## 2022-10-17 MED ORDER — HYDROMORPHONE HCL 1 MG/ML IJ SOLN
1.0000 mg | INTRAMUSCULAR | Status: DC | PRN
  Administered 2022-10-17 (×2): 1 mg via INTRAVENOUS
  Filled 2022-10-17 (×2): qty 1

## 2022-10-17 MED ORDER — SODIUM CHLORIDE 0.9 % IV BOLUS
500.0000 mL | Freq: Once | INTRAVENOUS | Status: AC
  Administered 2022-10-17: 500 mL via INTRAVENOUS

## 2022-10-17 MED ORDER — OXYCODONE HCL 5 MG PO TABS: 5.0000 mg | ORAL_TABLET | Freq: Four times a day (QID) | ORAL | 0 refills | Status: DC | PRN

## 2022-10-17 MED ORDER — ONDANSETRON HCL 4 MG/2ML IJ SOLN
4.0000 mg | Freq: Four times a day (QID) | INTRAMUSCULAR | Status: DC | PRN
  Administered 2022-10-17: 4 mg via INTRAVENOUS
  Filled 2022-10-17: qty 2

## 2022-10-17 MED ORDER — OXYCODONE HCL 5 MG PO TABS
5.0000 mg | ORAL_TABLET | Freq: Once | ORAL | Status: AC
  Administered 2022-10-17: 5 mg via ORAL
  Filled 2022-10-17: qty 1

## 2022-10-17 MED ORDER — IOHEXOL 350 MG/ML SOLN
75.0000 mL | Freq: Once | INTRAVENOUS | Status: AC | PRN
  Administered 2022-10-17: 75 mL via INTRAVENOUS

## 2022-10-17 MED ORDER — SODIUM CHLORIDE 0.9 % IV SOLN: INTRAVENOUS | Status: DC

## 2022-10-17 NOTE — Progress Notes (Signed)
Patient arrived and states is having "weird chest pain".  He is awake, alert, oriented X4. Dr. Earleen Newport and Edson Snowball, PA at bedside and reviewing 12 Lead EKG, assessing patient, and discussing c/o chest pain.  Plan to discontinue scheduled biopsy and transfer to [facility] ED.  Dr. Earleen Newport talking with ED Team.  See Dr. Pasty Arch note.

## 2022-10-17 NOTE — ED Notes (Signed)
First nurse note: pt brought on stretcher from [facility] for chest pain since 5-6 days ago.

## 2022-10-17 NOTE — ED Notes (Signed)
Order received for pt to return to [place]. Report called to [place] MA of facility.  [place] notified [facility] of transfer who stated they would put him on the list.  DC instructions given to pt verbally and in writing, understanding voiced, signature obtained.  Pt awaiting EMS for transfer.

## 2022-10-17 NOTE — Discharge Instructions (Signed)
Please seek medical attention for any high fevers, chest pain, shortness of breath, change in behavior, persistent vomiting, bloody stool or any other new or concerning symptoms.  

## 2022-10-17 NOTE — ED Notes (Signed)
ED tech will draw labs. IV does not draw back. Per EDP, redraw all labs.

## 2022-10-17 NOTE — ED Notes (Signed)
Pt provided with food and drink with md permission

## 2022-10-17 NOTE — ED Triage Notes (Signed)
Says he was here for a biopsy , but they sent him  here for chest pain for several days and says he is not sure if it is gout--says he has gout all over.  He is alert and oriented.  Any movement or touching the patient causes him pain.

## 2022-10-17 NOTE — ED Notes (Signed)
[facility] unwilling to transport pt's wheelchair when they took pt.  Pt's sticker applied to wheelchair and pt's family member will come get it tomorrow.  Wheelchair placed in Triage desk  area.

## 2022-10-17 NOTE — ED Notes (Signed)
Per Sharyn Blitz, labwork was already drawn today and was not drawn in triage.

## 2022-10-17 NOTE — Progress Notes (Signed)
Spoke to ED Charge Nurse, Nira Conn, with report and plan to transfer to ED via stretcher and she confirmed and to bring to ED Triage. Accompanied by RN/RN/NT to ED.

## 2022-10-17 NOTE — ED Provider Notes (Signed)
CT scan without concern for pulmonary embolism or infection. Does show sclerotic lesions which had been seen previously. At this time given lack of acute findings do feel it is reasonable for patient to be discharged back to rehab facility. Will give patient prescription for pain medication.   Nance Pear, MD 10/17/22 Lurena Nida

## 2022-10-17 NOTE — ED Notes (Signed)
Pt to CT

## 2022-10-17 NOTE — ED Provider Notes (Signed)
[facility] Provider Note   Event Date/Time   First MD Initiated Contact with Patient 10/17/22 1417     (approximate) History  Chest Pain and Gout (Gout all over)  HPI Roger [name] is a 66 y.o. male with a stated past medical history of prostate cancer with bony metastasis who presents complaining of "pain all over".  Patient states that since he has been in rehab 3 weeks ago he has had pain from his hips up through his chest to his arms every single day at 10/10 in severity and no medication has helped it.  Patient states that he was told to come back to oncology clinic today for biopsy as well as more treatment but was told to present to the emergency department due to this pain.  Exacerbating or relieving factors.  Patient states that is the same from when he wakes up to when he goes to bed. ROS: Patient currently denies any vision changes, tinnitus, difficulty speaking, facial droop, sore throat, shortness of breath, nausea/vomiting/diarrhea, dysuria, or weakness/numbness/paresthesias in any extremity   Physical Exam  Triage Vital Signs: ED Triage Vitals [10/17/22 1259]  Enc Vitals Group     BP (!) 153/88     Pulse Rate (!) 112     Resp 18     Temp 98.7 F (37.1 C)     Temp Source Oral     SpO2 95 %     Weight      Height      Head Circumference      Peak Flow      Pain Score 9     Pain Loc      Pain Edu?      Excl. in [place]?    Most recent vital signs: Vitals:   10/17/22 1259  BP: (!) 153/88  Pulse: (!) 112  Resp: 18  Temp: 98.7 F (37.1 C)  SpO2: 95%   General: Awake, oriented x4. CV:  Good peripheral perfusion.  Resp:  Normal effort.  Abd:  No distention.  Other:  Elderly African-American male laying in bed in no acute distress.  Pain and tenderness to palpation over all joints palpated including finger joints.  ED Results / Procedures / Treatments  Labs (all labs ordered are listed, but only abnormal results are displayed) Labs  Reviewed  COMPREHENSIVE METABOLIC PANEL - Abnormal; Notable for the following components:      Result Value   Sodium 131 (*)    Chloride 97 (*)    Glucose, Bld 166 (*)    Calcium 8.7 (*)    Albumin 2.6 (*)    Alkaline Phosphatase 297 (*)    All other components within normal limits  TROPONIN I (HIGH SENSITIVITY)   EKG ED ECG REPORT I, Naaman Plummer, the attending physician, personally viewed and interpreted this ECG. Date: 10/17/2022 EKG Time: 1255 Rate: 111 Rhythm: Tachycardic sinus rhythm QRS Axis: normal Intervals: normal ST/T Wave abnormalities: normal Narrative Interpretation: Tachycardic sinus rhythm.  No evidence of acute ischemia RADIOLOGY ED MD interpretation: One-view portable chest x-ray interpreted by me shows no evidence of acute abnormalities including no pneumonia, pneumothorax, or widened mediastinum  CT angiography of the chest pending at signout -Agree with radiology assessment Official radiology report(s): DG Chest Port 1 View  Result Date: 10/17/2022 CLINICAL DATA:  Chest pain EXAM: PORTABLE CHEST 1 VIEW COMPARISON:  Chest x-ray dated September 27, 2022 FINDINGS: Cardiac and mediastinal contours are unchanged within normal limits. Both lungs  are clear. The visualized skeletal structures are unremarkable. IMPRESSION: No active disease. Electronically Signed   By: Yetta Glassman M.D.   On: 10/17/2022 13:55   PROCEDURES: Critical Care performed: No Procedures MEDICATIONS ORDERED IN ED: Medications - No data to display IMPRESSION / MDM / [place] / ED COURSE  I reviewed the triage vital signs and the nursing notes.                             The patient is on the cardiac monitor to evaluate for evidence of arrhythmia and/or significant heart rate changes. Patient's presentation is most consistent with acute presentation with potential threat to life or bodily function. Workup: ECG, CXR, CBC, BMP, Troponin Findings: ECG: No overt evidence of  STEMI. No evidence of Brugadas sign, delta wave, epsilon wave, significantly prolonged QTc, or malignant arrhythmia HS Troponin: Negative x1 Other Labs unremarkable for emergent problems. CXR: Without PTX, PNA, or widened mediastinum CT PE: Pending HEART Score: 4  Given History, Exam, and Workup I have low suspicion for ACS, Pneumothorax, Pneumonia, Pulmonary Embolus, Tamponade, Aortic Dissection or other emergent problem as a cause for this presentation.   Disposition: Care of this patient will be signed out to the oncoming physician at the end of my shift.  All pertinent patient information conveyed and all questions answered.  All further care and disposition decisions will be made by the oncoming physician.    FINAL CLINICAL IMPRESSION(S) / ED DIAGNOSES   Final diagnoses:  None   Rx / DC Orders   ED Discharge Orders     None      Note:  This document was prepared using Dragon voice recognition software and may include unintentional dictation errors.   Naaman Plummer, MD 10/22/22 [phone]

## 2022-10-17 NOTE — ED Notes (Signed)
Report to Tammy, RN

## 2022-10-17 NOTE — H&P (Signed)
Chief Complaint: Patient was seen in consultation today for lymphadenopathy in setting of prostate cancer; subsequent discovery of signs and symptoms concerning for myocardial ischemia.   Referring Physician(s): Hollice Espy  Supervising Physician: Corrie Mckusick  Patient Status: [facility] - Out-pt  History of Present Illness: Roger [name] is a 66 y.o. male with PMH significant for prostate cancer, hypertension, history of heart attack, diabetes mellitus, gout, and obesity being seen today for pre-procedural evaluation for a lymph node biopsy. The patient had a recent admission to [facility] from 11/2-11/9 for sepsis related to an obstructed indwelling urinary catheter On arrival to the IR pre-procedural area, the patient reported substernal chest pain that feels like a heavy pressure in the center of his chest. He also is reporting dizziness. He states that he has been having these symptoms for the last day or two, but that they are worse this morning.  Past Medical History:  Diagnosis Date   Chest pain    a. 04/2016 MV: small, mild apical defect c/w apical ischemia, EF 55-65%-->Low risk study.   Diabetes mellitus without complication ([place])    Gout    Hematochezia    History of echocardiogram    a. 04/2016 Echo: EF 55-60%, mild LVH, no rwma, midly dil LA.   History of heart attack    Hypertension    Morbid obesity ([place])     Past Surgical History:  Procedure Laterality Date   COLONOSCOPY WITH PROPOFOL N/A 08/05/2018   Procedure: COLONOSCOPY WITH PROPOFOL;  Surgeon: Lin Landsman, MD;  Location: [facility] END[facility];  Service: Gastroenterology;  Laterality: N/A;   thumb surgery      Allergies: Albuterol and Mucinex [guaifenesin er]  Medications: Prior to Admission medications   Medication Sig Start Date End Date Taking? Authorizing Provider  folic acid (FOLVITE) 1 MG tablet Take 1 tablet (1 mg total) by mouth daily. 09/25/22  Yes Earlie Server, MD  isosorbide mononitrate (IMDUR) 30 MG  24 hr tablet TAKE 1 TABLET(30 MG) BY MOUTH DAILY 12/17/21  Yes Caryn Section Linden Dolin, PA-C  metFORMIN (GLUCOPHAGE) 500 MG tablet Take 1 tablet (500 mg total) by mouth 2 (two) times daily with a meal. 12/17/21  Yes Fisher, Linden Dolin, PA-C  metoprolol tartrate (LOPRESSOR) 25 MG tablet Take 1 tablet (25 mg total) by mouth 2 (two) times daily. 12/17/21  Yes Fisher, Linden Dolin, PA-C  pantoprazole (PROTONIX) 40 MG tablet Take 1 tablet (40 mg total) by mouth daily before breakfast. 10/06/22  Yes Amin, Ankit Chirag, MD  polyethylene glycol (MIRALAX / GLYCOLAX) 17 g packet Take 17 g by mouth daily as needed for mild constipation or moderate constipation. 10/06/22  Yes Amin, Jeanella Flattery, MD  rosuvastatin (CRESTOR) 10 MG tablet Take 1 tablet (10 mg total) by mouth daily. 12/17/21  Yes Fisher, Linden Dolin, PA-C  triamterene-hydrochlorothiazide (MAXZIDE-25) 37.5-25 MG tablet Take 1 tablet by mouth daily. 07/03/20  Yes Cook, Jayce G, DO  allopurinol (ZYLOPRIM) 100 MG tablet Take 2 tablets (200 mg total) by mouth 2 (two) times daily. 12/17/21 09/24/22  Caryn Section Linden Dolin, PA-C  aspirin EC 81 MG tablet Take 1 tablet (81 mg total) by mouth daily. 12/17/21   Fisher, Linden Dolin, PA-C  oxyCODONE-acetaminophen (PERCOCET/ROXICET) 5-325 MG tablet Take 1 tablet by mouth every 4 (four) hours as needed for moderate pain or severe pain. 10/06/22   Amin, Jeanella Flattery, MD  lisinopril (ZESTRIL) 20 MG tablet TAKE 1 TABLET(20 MG) BY MOUTH DAILY 05/13/19 06/25/19  Leone Haven, MD  Family History  Problem Relation Age of Onset   Heart Problems Mother    Breast cancer Mother    Mental illness Father    Heart disease Father    Stroke Father    Diabetes Father    Alcohol abuse Father    Bipolar disorder Father    Heart disease Other    Hypertension Other    Diabetes Other     Social History   Socioeconomic History   Marital status: Divorced    Spouse name: Not on file   Number of children: 5   Years of education: Not on file   Highest  education level: High school graduate  Occupational History   Not on file  Tobacco Use   Smoking status: Former    Packs/day: 0.25    Years: 25.00    Total pack years: 6.25    Types: Cigarettes    Quit date: 47    Years since quitting: 29.9   Smokeless tobacco: Never  Vaping Use   Vaping Use: Never used  Substance and Sexual Activity   Alcohol use: Yes    Alcohol/week: 42.0 standard drinks of alcohol    Types: 10 Cans of beer, 32 Shots of liquor per week    Comment: everyday. 3-5 servings whiskey nightly & beer   Drug use: No   Sexual activity: Not Currently  Other Topics Concern   Not on file  Social History Narrative   Lives at SNF.   Social Determinants of Health   Financial Resource Strain: Not on file  Food Insecurity: Not on file  Transportation Needs: Not on file  Physical Activity: Not on file  Stress: Not on file  Social Connections: Not on file    Review of Systems: A 12 point ROS discussed and pertinent positives are indicated in the HPI above.  All other systems are negative.  Review of Systems  Constitutional:  Positive for fatigue. Negative for chills and fever.  Respiratory:  Negative for chest tightness and shortness of breath.   Cardiovascular:  Positive for chest pain and leg swelling.  Gastrointestinal:  Negative for abdominal pain, diarrhea, nausea and vomiting.  Neurological:  Positive for dizziness. Negative for headaches.  Psychiatric/Behavioral:  Positive for confusion. The patient is nervous/anxious.     Vital Signs: BP 122/88   Pulse (!) 114   Temp 97.9 F (36.6 C) (Oral)   Resp (!) 25   Ht '5\' 10"'$  (1.778 m)   Wt 212 lb (96.2 kg)   SpO2 95%   BMI 30.42 kg/m     Physical Exam Vitals reviewed.  Constitutional:      General: He is not in acute distress. HENT:     Mouth/Throat:     Mouth: Mucous membranes are moist.  Cardiovascular:     Rate and Rhythm: Regular rhythm. Tachycardia present.     Comments: Patient with bounding  carotid pulse and visible pulsation in right neck Pulmonary:     Effort: Pulmonary effort is normal.     Breath sounds: Normal breath sounds.     Comments: Tachypnea of 24 during exam. Patient on nasal cannula at 2L at time of exam Abdominal:     General: Bowel sounds are normal.     Palpations: Abdomen is soft.     Tenderness: There is no abdominal tenderness.  Musculoskeletal:     Right lower leg: Edema present.     Left lower leg: Edema present.  Skin:    General: Skin is  warm and dry.  Neurological:     Mental Status: He is alert and oriented to person, place, and time.  Psychiatric:        Mood and Affect: Mood normal.        Behavior: Behavior normal.        Thought Content: Thought content normal.        Judgment: Judgment normal.     Imaging: US Abdomen Limited RUQ (LIVER/GB)  Result Date: 10/02/2022 CLINICAL DATA:  Hepatic cirrhosis, diabetes mellitus, hypertension EXAM: ULTRASOUND ABDOMEN LIMITED RIGHT UPPER QUADRANT COMPARISON:  02/23/2022 FINDINGS: Gallbladder: Dependent sludge and scattered echogenic shadowing foci consistent with gallstones in gallbladder. No gallbladder wall thickening, pericholecystic fluid, or sonographic Murphy sign. Common bile duct: Diameter: 5 mm, normal Liver: Echogenic parenchyma, likely fatty infiltration though this can be seen with cirrhosis and certain infiltrative disorders. No definite hepatic mass or nodularity identified though assessment of intrahepatic detail is limited by sound attenuation. Portal vein is patent on color Doppler imaging with normal direction of blood flow towards the liver. Other: No RIGHT upper quadrant free fluid. IMPRESSION: Sludge and small shadowing calculi within gallbladder without evidence of acute cholecystitis or biliary obstruction. Question fatty infiltration of liver versus cirrhosis as above. Electronically Signed   By: Lavonia Dana M.D.   On: 10/02/2022 10:10   DG Knee 1-2 Views Right  Result Date:  09/29/2022 CLINICAL DATA:  Septic joint. EXAM: RIGHT KNEE - 1-2 VIEW COMPARISON:  None Available. FINDINGS: Small knee joint effusion. No evidence of lipohemarthrosis. Peripherally corticated ossicle adjacent to the superior pole of the patella likely represents the sequela of remote avulsive injury. No acute fracture or dislocation. Moderate tricompartmental degenerative change of the knee, worse within the medial compartment with joint space loss, subchondral sclerosis and osteophytosis. There is minimal spurring of the tibial spines. A small amount of chondrocalcinosis is seen within the lateral joint space. Scattered adjacent vascular calcifications. No radiopaque foreign body. No discrete areas of osteolysis to suggest osteomyelitis. IMPRESSION: 1. Small knee joint effusion.  Otherwise, no acute findings. 2. No discrete areas of osteolysis to suggest osteomyelitis. 3. Moderate tricompartmental degenerative change of the knee, worse within the medial compartment. 4. Chondrocalcinosis as could be seen in the setting of CPPD. Electronically Signed   By: Sandi Mariscal M.D.   On: 09/29/2022 11:47   US Venous Img Lower Unilateral Right (DVT)  Result Date: 09/29/2022 CLINICAL DATA:  Right lower extremity pain for past week. Evaluate for DVT. EXAM: RIGHT LOWER EXTREMITY VENOUS DOPPLER ULTRASOUND TECHNIQUE: Gray-scale sonography with graded compression, as well as color Doppler and duplex ultrasound were performed to evaluate the lower extremity deep venous systems from the level of the common femoral vein and including the common femoral, femoral, profunda femoral, popliteal and calf veins including the posterior tibial, peroneal and gastrocnemius veins when visible. The superficial great saphenous vein was also interrogated. Spectral Doppler was utilized to evaluate flow at rest and with distal augmentation maneuvers in the common femoral, femoral and popliteal veins. COMPARISON:  Right lower extremity venous  Doppler ultrasound-09/08/2022 (negative). FINDINGS: Contralateral Common Femoral Vein: Respiratory phasicity is normal and symmetric with the symptomatic side. No evidence of thrombus. Normal compressibility. Common Femoral Vein: No evidence of thrombus. Normal compressibility, respiratory phasicity and response to augmentation. Saphenofemoral Junction: No evidence of thrombus. Normal compressibility and flow on color Doppler imaging. Profunda Femoral Vein: No evidence of thrombus. Normal compressibility and flow on color Doppler imaging. Femoral Vein: No evidence of thrombus. Normal  compressibility, respiratory phasicity and response to augmentation. Popliteal Vein: No evidence of thrombus. Normal compressibility, respiratory phasicity and response to augmentation. Calf Veins: No evidence of thrombus. Normal compressibility and flow on color Doppler imaging. Superficial Great Saphenous Vein: No evidence of thrombus. Normal compressibility. Other Findings:  None. IMPRESSION: No evidence of DVT within the right lower extremity, similar to the 09/08/2022 examination. Electronically Signed   By: Sandi Mariscal M.D.   On: 09/29/2022 11:45   DG Chest Port 1 View  Result Date: 09/27/2022 CLINICAL DATA:  Generalized weakness and low energy. Low-grade fever. Questionable sepsis. Check for infiltrate. EXAM: PORTABLE CHEST 1 VIEW COMPARISON:  PA Lat chest 04/01/2021 FINDINGS: There is a low inspiration exaggerating the mediastinum and heart. There is mild-to-moderate cardiomegaly. Aortic tortuosity and ectasia are noted with patchy calcification of the transverse segment. The hypoinflated lungs appear generally clear but there is limited view of bases. No pleural effusion is seen. Thoracic spondylosis. There is a history of prostate cancer with bone metastases. Recent abdomen and pelvis CT 09/08/2022 demonstrated widespread bony metastases in the ribcage, spine and pelvis. There are sclerotic lesions in both proximal humeri  which are probably also metastatic. IMPRESSION: 1. Low inspiration exaggerating the mediastinum and heart. No evidence of acute chest process. Limited view of the bases. 2. Cardiomegaly. 3. Aortic atherosclerosis. 4. Widespread bony metastases. Electronically Signed   By: Telford Nab M.D.   On: 09/27/2022 20:27    Labs:  CBC: Recent Labs    10/03/22 0621 10/04/22 0347 10/04/22 1833 10/05/22 1058 10/06/22 0540  WBC 11.5* 14.6*  --  18.6* 20.6*  HGB 7.5* 6.9* 8.0* 8.1* 8.1*  HCT 22.6* 20.7* 23.9* 24.2* 24.4*  PLT 102* 127*  --  167 193    COAGS: Recent Labs    09/27/22 1954  INR 1.6*  APTT 39*    BMP: Recent Labs    10/03/22 0621 10/04/22 0347 10/05/22 1058 10/06/22 0540  NA 142 137 135 138  K 3.9 4.1 4.6 4.5  CL 111 109 107 108  CO2 '24 24 24 25  '$ GLUCOSE 123* 81 197* 147*  BUN 54* 40* 26* 25*  CALCIUM 8.3* 7.9* 7.8* 7.9*  CREATININE 1.56* 1.34* 1.23 1.21  GFRNONAA 49* 59* >60 >60    LIVER FUNCTION TESTS: Recent Labs    09/28/22 0609 10/04/22 0347 10/05/22 1058 10/06/22 0540  BILITOT 1.1 0.8 0.9 0.9  AST '29 18 17 '$ 12*  ALT '13 12 12 11  '$ ALKPHOS 242* 157* 196* 208*  PROT 5.7* 5.5* 6.2* 6.0*  ALBUMIN 2.0* 2.0* 2.2* 2.1*    TUMOR MARKERS: No results for input(s): "AFPTM", "CEA", "CA199", "CHROMGRNA" in the last 8760 hours.  Assessment and Plan:  Roger [name] is a 66 yo male with PMH significant for prostate cancer, hypertension, history of heart attack, diabetes mellitus, gout, and obesity being seen today for pre-procedural evaluation for a lymph node biopsy. On examination today, the patient had symptoms concerning for possible myocardial injury, with substernal chest pain and dizziness reported. A 12-lead EKG was obtained in the pre-procedural area which revealed sinus tachycardia and T-wave changes concerning for possible ischemia. In conjunction with Dr Earleen Newport, the patient was reexamined and it was determined that the patient should be transferred to  the ED for additional work-up. The ED team was notified and advised the IR team that the patient should be brought to the ED lobby. A peripheral IV was placed, but IR team was informed by the ED nurse that a  troponin lab should not be drawn at this time. The patient was subsequently prepared for transportation to the ED from the IR pre-procedural area.   Electronically Signed: Lura Em, PA-C 10/17/2022, 11:59 AM   I spent a total of  40 Minutes   in face to face in clinical consultation, greater than 50% of which was counseling/coordinating care for lymphadenopathy in setting of prostate cancer; subsequent discovery of signs and symptoms concerning for myocardial ischemia.

## 2022-10-23 ENCOUNTER — Telehealth: Payer: Self-pay | Admitting: Family Medicine

## 2022-10-23 NOTE — Telephone Encounter (Signed)
Spoke to nurse at [facility] and she was notified patient will need to come to our office at 9:00 for Firmagon injection then he will need to be sent to IR at 9:30. She states she will have transportation call me back and confirm all of the transportation requirements.

## 2022-10-23 NOTE — Telephone Encounter (Signed)
Patient resides at [facility] in [place] was notified patient is  back on Wed 12/6 at 10:30a and arrive at 9:30a for the CT abdominal mass Biopsy. His last dose of Aspirin '81mg'$  would be this Thurs 11/30 and then he would not take it again until after his procedure.

## 2022-10-30 ENCOUNTER — Other Ambulatory Visit: Payer: Self-pay | Admitting: Radiology

## 2022-10-30 DIAGNOSIS — C61 Malignant neoplasm of prostate: Secondary | ICD-10-CM

## 2022-10-30 NOTE — Progress Notes (Deleted)
10/31/2022 2:59 PM   [name] September 07, 1956 [id number]  Referring provider: Lahoma Rocker, MD [address] [place],  [place] [address]  Urological history: 1. Advanced metastatic prostate cancer -PSA > 1500  -CT abdominal mass today  2. Urinary retention -secondary to prostate cancer  No chief complaint on file.   HPI: Roger [name] is a 66 y.o. male who presents today for ADT.   The procedure is discussed with patient.  He is allowed to ask questions.  Questions were answered to his satisfaction.  We were able to complete the titration.   PMH: Past Medical History:  Diagnosis Date   Chest pain    a. 04/2016 MV: small, mild apical defect c/w apical ischemia, EF 55-65%-->Low risk study.   Diabetes mellitus without complication ([place])    Gout    Hematochezia    History of echocardiogram    a. 04/2016 Echo: EF 55-60%, mild LVH, no rwma, midly dil LA.   History of heart attack    Hypertension    Morbid obesity ([place])     Surgical History: Past Surgical History:  Procedure Laterality Date   COLONOSCOPY WITH PROPOFOL N/A 08/05/2018   Procedure: COLONOSCOPY WITH PROPOFOL;  Surgeon: Lin Landsman, MD;  Location: [facility] ENDOSCOPY;  Service: Gastroenterology;  Laterality: N/A;   thumb surgery      Home Medications:  Allergies as of 10/31/2022       Reactions   Albuterol Nausea And Vomiting   Mucinex [guaifenesin Er] Rash        Medication List        Accurate as of October 30, 2022  2:59 PM. If you have any questions, ask your nurse or doctor.          allopurinol 100 MG tablet Commonly known as: ZYLOPRIM Take 2 tablets (200 mg total) by mouth 2 (two) times daily.   aspirin EC 81 MG tablet Take 1 tablet (81 mg total) by mouth daily.   folic acid 1 MG tablet Commonly known as: FOLVITE Take 1 tablet (1 mg total) by mouth daily.   isosorbide mononitrate 30 MG 24 hr tablet Commonly known as: IMDUR TAKE 1 TABLET(30 MG) BY MOUTH  DAILY   metFORMIN 500 MG tablet Commonly known as: GLUCOPHAGE Take 1 tablet (500 mg total) by mouth 2 (two) times daily with a meal.   metoprolol tartrate 25 MG tablet Commonly known as: LOPRESSOR Take 1 tablet (25 mg total) by mouth 2 (two) times daily.   oxyCODONE 5 MG immediate release tablet Commonly known as: Roxicodone Take 1 tablet (5 mg total) by mouth every 6 (six) hours as needed for severe pain.   oxyCODONE-acetaminophen 5-325 MG tablet Commonly known as: PERCOCET/ROXICET Take 1 tablet by mouth every 4 (four) hours as needed for moderate pain or severe pain.   pantoprazole 40 MG tablet Commonly known as: PROTONIX Take 1 tablet (40 mg total) by mouth daily before breakfast.   polyethylene glycol 17 g packet Commonly known as: MIRALAX / GLYCOLAX Take 17 g by mouth daily as needed for mild constipation or moderate constipation.   rosuvastatin 10 MG tablet Commonly known as: Crestor Take 1 tablet (10 mg total) by mouth daily.   triamterene-hydrochlorothiazide 37.5-25 MG tablet Commonly known as: MAXZIDE-25 Take 1 tablet by mouth daily.        Allergies:  Allergies  Allergen Reactions   Albuterol Nausea And Vomiting   Mucinex [Guaifenesin Er] Rash    Family History:  Family History  Problem Relation Age of Onset   Heart Problems Mother    Breast cancer Mother    Mental illness Father    Heart disease Father    Stroke Father    Diabetes Father    Alcohol abuse Father    Bipolar disorder Father    Heart disease Other    Hypertension Other    Diabetes Other     Social History:  reports that he quit smoking about 29 years ago. His smoking use included cigarettes. He has a 6.25 pack-year smoking history. He has never used smokeless tobacco. He reports current alcohol use of about 42.0 standard drinks of alcohol per week. He reports that he does not use drugs.  ROS: Pertinent ROS in HPI  Physical Exam: There were no vitals taken for this visit.   Constitutional:  Well nourished. Alert and oriented, No acute distress. HEENT: [place] AT, moist mucus membranes.  Trachea midline, no masses. Cardiovascular: No clubbing, cyanosis, or edema. Respiratory: Normal respiratory effort, no increased work of breathing. Neurologic: Grossly intact, no focal deficits, moving all 4 extremities. Psychiatric: Normal mood and affect.  Laboratory Data: Lab Results  Component Value Date   WBC 19.9 (H) 10/17/2022   HGB 8.4 (L) 10/17/2022   HCT 26.1 (L) 10/17/2022   MCV 96.0 10/17/2022   PLT 298 10/17/2022    Lab Results  Component Value Date   CREATININE 1.03 10/17/2022    Lab Results  Component Value Date   HGBA1C 6.5 (H) 09/27/2022    Lab Results  Component Value Date   AST 17 10/17/2022   Lab Results  Component Value Date   ALT 15 10/17/2022  I have reviewed the labs.   Pertinent Imaging Pending    Assessment & Plan:  ***  1. Advanced metastatic prostate cancer -Discussed with patient injection site reactions could include transient burning and stinging, pain, bruising and redness -Discussed with patient that common side effects include hot flashes, sweats, fatigue, weakness, muscle pain, dizziness, clamminess, testicular shrinkage, decreased erections and enlargement of breasts -Discussed the side effect of thinning of the bones that may lead to a fracture and it is important that he start calcium supplementation with vitamin D -Discussed the increased risks of heart attack, irregular heartbeat, sudden death due to heart problems and stroke -Discussed that elevated blood sugar and increased risk developing diabetes may occur   No follow-ups on file.  These notes generated with voice recognition software. I apologize for typographical errors.  [place], [place] [address]  [place],  [phone]

## 2022-10-30 NOTE — Progress Notes (Signed)
Patient for CT guided LT Para-Aortic LN Biopsy on Wed 10/31/2022, I called and spoke with his RN Anesia on the phone and gave pre-procedure instructions. The RN Ralene Muskrat was made aware that the patient is to be here at 9:30a at the new entrance (although, she said that he maybe as early as 30 min due to transportation and his caregiver. His last dose of Aspirin 81 mg was on Thurs 10/25/2022, pt will be NPO after MN prior to procedure as well as driver post procedure/recovery/discharge. His RN Ralene Muskrat stated understanding.  Called 10/30/2022

## 2022-10-31 ENCOUNTER — Encounter: Payer: Self-pay | Admitting: Urology

## 2022-10-31 ENCOUNTER — Ambulatory Visit
Admission: RE | Admit: 2022-10-31 | Discharge: 2022-10-31 | Disposition: A | Discharge status: Home / Self Care | Payer: 59 | Source: Ambulatory Visit | Attending: Urology | Admitting: Urology

## 2022-10-31 ENCOUNTER — Other Ambulatory Visit: Payer: Self-pay

## 2022-10-31 ENCOUNTER — Ambulatory Visit: Payer: 59 | Admitting: Urology

## 2022-10-31 ENCOUNTER — Ambulatory Visit (INDEPENDENT_AMBULATORY_CARE_PROVIDER_SITE_OTHER): Payer: 59 | Admitting: Urology

## 2022-10-31 DIAGNOSIS — R072 Precordial pain: Secondary | ICD-10-CM | POA: Diagnosis not present

## 2022-10-31 DIAGNOSIS — C61 Malignant neoplasm of prostate: Secondary | ICD-10-CM | POA: Diagnosis present

## 2022-10-31 DIAGNOSIS — R9431 Abnormal electrocardiogram [ECG] [EKG]: Secondary | ICD-10-CM | POA: Diagnosis not present

## 2022-10-31 DIAGNOSIS — I10 Essential (primary) hypertension: Secondary | ICD-10-CM | POA: Diagnosis not present

## 2022-10-31 DIAGNOSIS — E119 Type 2 diabetes mellitus without complications: Secondary | ICD-10-CM | POA: Diagnosis not present

## 2022-10-31 DIAGNOSIS — R591 Generalized enlarged lymph nodes: Secondary | ICD-10-CM | POA: Insufficient documentation

## 2022-10-31 DIAGNOSIS — I252 Old myocardial infarction: Secondary | ICD-10-CM | POA: Diagnosis not present

## 2022-10-31 DIAGNOSIS — R339 Retention of urine, unspecified: Secondary | ICD-10-CM | POA: Diagnosis not present

## 2022-10-31 DIAGNOSIS — R079 Chest pain, unspecified: Secondary | ICD-10-CM

## 2022-10-31 LAB — CBC
HCT: 29.5 % — ABNORMAL LOW (ref 39.0–52.0)
Hemoglobin: 9.4 g/dL — ABNORMAL LOW (ref 13.0–17.0)
MCH: 30.7 pg (ref 26.0–34.0)
MCHC: 31.9 g/dL (ref 30.0–36.0)
MCV: 96.4 fL (ref 80.0–100.0)
Platelets: 300 10*3/uL (ref 150–400)
RBC: 3.06 MIL/uL — ABNORMAL LOW (ref 4.22–5.81)
RDW: 15.7 % — ABNORMAL HIGH (ref 11.5–15.5)
WBC: 14.6 10*3/uL — ABNORMAL HIGH (ref 4.0–10.5)
nRBC: 0.3 % — ABNORMAL HIGH (ref 0.0–0.2)

## 2022-10-31 LAB — GLUCOSE, CAPILLARY
Glucose-Capillary: 102 mg/dL — ABNORMAL HIGH (ref 70–99)
Glucose-Capillary: 108 mg/dL — ABNORMAL HIGH (ref 70–99)

## 2022-10-31 MED ORDER — DEGARELIX ACETATE(240 MG DOSE) 120 MG/VIAL ~~LOC~~ SOLR
240.0000 mg | Freq: Once | SUBCUTANEOUS | Status: AC
  Administered 2022-10-31: 240 mg via SUBCUTANEOUS

## 2022-10-31 MED ORDER — MIDAZOLAM HCL 2 MG/ML PO SYRP
ORAL_SOLUTION | ORAL | Status: AC
  Administered 2022-10-31: 4 mg via ORAL
  Filled 2022-10-31: qty 5

## 2022-10-31 MED ORDER — MIDAZOLAM HCL 2 MG/2ML IJ SOLN
INTRAMUSCULAR | Status: AC
  Filled 2022-10-31: qty 4

## 2022-10-31 MED ORDER — MIDAZOLAM HCL 2 MG/2ML IJ SOLN
INTRAMUSCULAR | Status: AC | PRN
  Administered 2022-10-31: 1 mg via INTRAVENOUS

## 2022-10-31 MED ORDER — LIDOCAINE HCL (PF) 1 % IJ SOLN
10.0000 mL | Freq: Once | INTRAMUSCULAR | Status: AC
  Administered 2022-10-31: 10 mL

## 2022-10-31 MED ORDER — MIDAZOLAM HCL 2 MG/ML PO SYRP: 4.0000 mg | ORAL_SOLUTION | Freq: Once | ORAL | Status: AC

## 2022-10-31 MED ORDER — ELIGARD 45 MG ~~LOC~~ KIT: 45.0000 mg | PACK | SUBCUTANEOUS | 1 refills | Status: DC

## 2022-10-31 MED ORDER — FENTANYL CITRATE (PF) 100 MCG/2ML IJ SOLN
INTRAMUSCULAR | Status: AC | PRN
  Administered 2022-10-31: 50 ug via INTRAVENOUS

## 2022-10-31 MED ORDER — SODIUM CHLORIDE 0.9 % IV SOLN
INTRAVENOUS | Status: DC
  Administered 2022-10-31: 10 mL via INTRAVENOUS

## 2022-10-31 MED ORDER — FENTANYL CITRATE (PF) 100 MCG/2ML IJ SOLN
INTRAMUSCULAR | Status: AC
  Filled 2022-10-31: qty 2

## 2022-10-31 NOTE — Progress Notes (Signed)
Firmagon Sub Q Injection  Due to Prostate Cancer patient is present today for a Firmagon Injection.   Medication: Mills Koller (Degarelix)  Dose: '240mg'$  Location: right and left upper abdomen Lot: [id number] Exp: 03/2024  Patient tolerated well, no complications were noted  Performed by: Bradly Bienenstock CMA and Elberta Leatherwood CMA  Follow up: RTC in 1 mo for Eligard.

## 2022-10-31 NOTE — OR Nursing (Signed)
Plan to transfer to Dr Erlene Quan office to get injection since he missed morning appointment due to biopsy. Patient and sister informed.

## 2022-10-31 NOTE — Telephone Encounter (Signed)
Rx for Eligard sent into to [facility]. They will initiate PA, awaiting response.

## 2022-10-31 NOTE — Procedures (Signed)
Interventional Radiology Procedure Note  Procedure: CT guided left retroperitoneal lymph node biopsy  Findings: Please refer to procedural dictation for full description. 18 ga core x3 from left retroperitoneal node.  Complications: None immediate  Estimated Blood Loss: < 5 ml  Recommendations: 2 hour bedrest. Follow up Pathology results.   Ruthann Cancer, MD

## 2022-10-31 NOTE — Sedation Documentation (Signed)
PT sample "inadequate " per lab. MD made aware. No new PT needed to be drawn per MD. Will use resulted PT from 11/22.

## 2022-10-31 NOTE — H&P (Signed)
Chief Complaint: Patient was seen in consultation today for lymphadenopathy at the request of [place]  Referring Physician(s): Hollice Espy  Supervising Physician: Ruthann Cancer  [name] - Out-pt  History of Present Illness: Roger [name] is a 66 y.o. male with PMH significant for prostate cancer, hypertension, history of heart attack, diabetes mellitus, gout, and obesity being seen today for pre-procedural evaluation for a lymph node biopsy. The patient had a recent admission to [facility] from 11/2-11/9 for sepsis related to an obstructed indwelling urinary catheter. The patient was referred to IR for image-guided biopsy due to lymphadenopathy of  para-aortic lymph node in the setting of existing prostate cancer. Notably, patient was seen on 11/22 for biopsy, but biopsy was deferred due to patient reporting significant substernal chest pain with EKG changes. The patient was worked up in the ED at that time, where no concern for ACS, myocardial infarction, or pulmonary embolism were found at that time. The patient reports he is not experiencing any chest pain or similar symptoms today.  Past Medical History:  Diagnosis Date   Chest pain    a. 04/2016 MV: small, mild apical defect c/w apical ischemia, EF 55-65%-->Low risk study.   Diabetes mellitus without complication ([place])    Gout    Hematochezia    History of echocardiogram    a. 04/2016 Echo: EF 55-60%, mild LVH, no rwma, midly dil LA.   History of heart attack    Hypertension    Morbid obesity ([place])     Past Surgical History:  Procedure Laterality Date   COLONOSCOPY WITH PROPOFOL N/A 08/05/2018   Procedure: COLONOSCOPY WITH PROPOFOL;  Surgeon: Lin Landsman, MD;  Location: [facility] ENDOSCOPY;  Service: Gastroenterology;  Laterality: N/A;   thumb surgery      Allergies: Albuterol and Mucinex [guaifenesin er]  Medications: Prior to Admission medications   Medication Sig Start Date End Date Taking?  Authorizing Provider  allopurinol (ZYLOPRIM) 100 MG tablet Take 2 tablets (200 mg total) by mouth 2 (two) times daily. 12/17/21 09/24/22  Caryn Section Linden Dolin, PA-C  aspirin EC 81 MG tablet Take 1 tablet (81 mg total) by mouth daily. 12/17/21   Fisher, Linden Dolin, PA-C  folic acid (FOLVITE) 1 MG tablet Take 1 tablet (1 mg total) by mouth daily. 09/25/22   Earlie Server, MD  isosorbide mononitrate (IMDUR) 30 MG 24 hr tablet TAKE 1 TABLET(30 MG) BY MOUTH DAILY 12/17/21   Caryn Section Linden Dolin, PA-C  metFORMIN (GLUCOPHAGE) 500 MG tablet Take 1 tablet (500 mg total) by mouth 2 (two) times daily with a meal. 12/17/21   Fisher, Linden Dolin, PA-C  metoprolol tartrate (LOPRESSOR) 25 MG tablet Take 1 tablet (25 mg total) by mouth 2 (two) times daily. 12/17/21   Fisher, Linden Dolin, PA-C  oxyCODONE (ROXICODONE) 5 MG immediate release tablet Take 1 tablet (5 mg total) by mouth every 6 (six) hours as needed for severe pain. 10/17/22   Nance Pear, MD  oxyCODONE-acetaminophen (PERCOCET/ROXICET) 5-325 MG tablet Take 1 tablet by mouth every 4 (four) hours as needed for moderate pain or severe pain. 10/06/22   Amin, Jeanella Flattery, MD  pantoprazole (PROTONIX) 40 MG tablet Take 1 tablet (40 mg total) by mouth daily before breakfast. 10/06/22   Amin, Jeanella Flattery, MD  polyethylene glycol (MIRALAX / GLYCOLAX) 17 g packet Take 17 g by mouth daily as needed for mild constipation or moderate constipation. 10/06/22   Amin, Jeanella Flattery, MD  rosuvastatin (CRESTOR) 10 MG tablet Take 1 tablet (  10 mg total) by mouth daily. 12/17/21   Fisher, Linden Dolin, PA-C  triamterene-hydrochlorothiazide (MAXZIDE-25) 37.5-25 MG tablet Take 1 tablet by mouth daily. 07/03/20   Coral Spikes, DO  lisinopril (ZESTRIL) 20 MG tablet TAKE 1 TABLET(20 MG) BY MOUTH DAILY 05/13/19 06/25/19  Leone Haven, MD     Family History  Problem Relation Age of Onset   Heart Problems Mother    Breast cancer Mother    Mental illness Father    Heart disease Father    Stroke Father     Diabetes Father    Alcohol abuse Father    Bipolar disorder Father    Heart disease Other    Hypertension Other    Diabetes Other     Social History   Socioeconomic History   Marital status: Divorced    Spouse name: Not on file   Number of children: 5   Years of education: Not on file   Highest education level: High school graduate  Occupational History   Not on file  Tobacco Use   Smoking status: Former    Packs/day: 0.25    Years: 25.00    Total pack years: 6.25    Types: Cigarettes    Quit date: 43    Years since quitting: 29.9   Smokeless tobacco: Never  Vaping Use   Vaping Use: Never used  Substance and Sexual Activity   Alcohol use: Yes    Alcohol/week: 42.0 standard drinks of alcohol    Types: 10 Cans of beer, 32 Shots of liquor per week    Comment: everyday. 3-5 servings whiskey nightly & beer   Drug use: No   Sexual activity: Not Currently  Other Topics Concern   Not on file  Social History Narrative   Lives at SNF.   Social Determinants of Health   Financial Resource Strain: Not on file  Food Insecurity: Not on file  Transportation Needs: Not on file  Physical Activity: Not on file  Stress: Not on file  Social Connections: Not on file    Review of Systems: A 12 point ROS discussed and pertinent positives are indicated in the HPI above.  All other systems are negative.  Review of Systems  Constitutional:  Negative for chills and fever.  Respiratory:  Negative for chest tightness and shortness of breath.   Cardiovascular:  Negative for chest pain and leg swelling.  Gastrointestinal:  Negative for diarrhea, nausea and vomiting.  Neurological:  Negative for dizziness and headaches.  Psychiatric/Behavioral:  Negative for confusion. The patient is nervous/anxious.     Vital Signs: BP 120/82   Pulse (!) 109   Temp (!) 97.3 F (36.3 C) (Oral)   Resp 15   Ht '5\' 10"'$  (1.778 m)   Wt 214 lb (97.1 kg)   SpO2 97%   BMI 30.71 kg/m     Physical  Exam Vitals reviewed.  Constitutional:      General: He is not in acute distress. Cardiovascular:     Rate and Rhythm: Regular rhythm. Tachycardia present.     Pulses: Normal pulses.     Heart sounds: Normal heart sounds.  Pulmonary:     Effort: Pulmonary effort is normal.     Breath sounds: Wheezing present.     Comments: Mild wheezing appreciated in left lung, no wheezing on right Abdominal:     General: Bowel sounds are normal.     Palpations: Abdomen is soft.     Tenderness: There is no abdominal  tenderness.  Musculoskeletal:     Right lower leg: No edema.     Left lower leg: No edema.  Skin:    General: Skin is warm and dry.  Neurological:     Mental Status: He is alert and oriented to person, place, and time.  Psychiatric:        Mood and Affect: Mood normal.        Behavior: Behavior normal.     Imaging: CT Angio Chest PE W/Cm &/Or Wo Cm  Result Date: 10/17/2022 CLINICAL DATA:  Chest pain for several days. History of gout. History of prostate cancer. Concern for pulmonary embolism. EXAM: CT ANGIOGRAPHY CHEST WITH CONTRAST TECHNIQUE: Multidetector CT imaging of the chest was performed using the standard protocol during bolus administration of intravenous contrast. Multiplanar CT image reconstructions and MIPs were obtained to evaluate the vascular anatomy. RADIATION DOSE REDUCTION: This exam was performed according to the departmental dose-optimization program which includes automated exposure control, adjustment of the mA and/or kV according to patient size and/or use of iterative reconstruction technique. CONTRAST:  48m OMNIPAQUE IOHEXOL 350 MG/ML SOLN COMPARISON:  None Available. FINDINGS: Cardiovascular: No filling defects within the pulmonary arteries to suggest acute pulmonary embolism. Mediastinum/Nodes: No axillary adenopathy. No new mediastinal hilar adenopathy. There is a lobular mass associated with the first rib manubrial junction on the RIGHT. And osseous erosion  at this interface of the joint. (Image 19/4). There is a rounded soft tissue mass deep to the joint within the upper mediastinum on the RIGHT measuring 2.6 cm (image 75/3). On the superficial side of the joint beneath the pectoralis muscle there is a irregular 2.7 cm soft tissue lesion (image 22/4). There is a lymph node along the descending thoracic aorta medially measuring 10 mm (image 35/4) Lungs/Pleura: No pulmonary infarction. No pneumonia. No pleural fluid. No pneumothorax Upper Abdomen: Limited view of the liver, kidneys, pancreas are unremarkable. Normal adrenal glands. Musculoskeletal: Are arthropathy at the first rib and sternal junction described above. Diffuse sclerosis of the bone of the lumbar thoracic spine and sternum. Review of the MIP images confirms the above findings. IMPRESSION: 1. No evidence acute pulmonary embolism. 2. Widespread sclerotic skeletal metastasis similar to CT 2014. 3. Lymph node along the descending thoracic aorta could represent a metastatic prostate cancer lymph node. 4. Lobular soft tissue on either side of the first RIGHT rib and sternal junction associated with erosion of the joint. Favor inflammatory arthropathy rather than a metastatic lesion. Patient's history of gout which may contribute to this finding. Electronically Signed   By: SSuzy BouchardM.D.   On: 10/17/2022 16:47   DG Chest Port 1 View  Result Date: 10/17/2022 CLINICAL DATA:  Chest pain EXAM: PORTABLE CHEST 1 VIEW COMPARISON:  Chest x-ray dated September 27, 2022 FINDINGS: Cardiac and mediastinal contours are unchanged within normal limits. Both lungs are clear. The visualized skeletal structures are unremarkable. IMPRESSION: No active disease. Electronically Signed   By: LYetta GlassmanM.D.   On: 10/17/2022 13:55   [place]Abdomen Limited RUQ (LIVER/GB)  Result Date: 10/02/2022 CLINICAL DATA:  Hepatic cirrhosis, diabetes mellitus, hypertension EXAM: ULTRASOUND ABDOMEN LIMITED RIGHT UPPER QUADRANT  COMPARISON:  02/23/2022 FINDINGS: Gallbladder: Dependent sludge and scattered echogenic shadowing foci consistent with gallstones in gallbladder. No gallbladder wall thickening, pericholecystic fluid, or sonographic Murphy sign. Common bile duct: Diameter: 5 mm, normal Liver: Echogenic parenchyma, likely fatty infiltration though this can be seen with cirrhosis and certain infiltrative disorders. No definite hepatic mass or nodularity identified  though assessment of intrahepatic detail is limited by sound attenuation. Portal vein is patent on color Doppler imaging with normal direction of blood flow towards the liver. Other: No RIGHT upper quadrant free fluid. IMPRESSION: Sludge and small shadowing calculi within gallbladder without evidence of acute cholecystitis or biliary obstruction. Question fatty infiltration of liver versus cirrhosis as above. Electronically Signed   By: Lavonia Dana M.D.   On: 10/02/2022 10:10    Labs:  CBC: Recent Labs    10/05/22 1058 10/06/22 0540 10/17/22 1353 10/31/22 1005  WBC 18.6* 20.6* 19.9* 14.6*  HGB 8.1* 8.1* 8.4* 9.4*  HCT 24.2* 24.4* 26.1* 29.5*  PLT 167 193 298 300    COAGS: Recent Labs    09/27/22 1954 10/17/22 1353  INR 1.6* 1.2  APTT 39*  --     BMP: Recent Labs    10/04/22 0347 10/05/22 1058 10/06/22 0540 10/17/22 1353  NA 137 135 138 131*  K 4.1 4.6 4.5 4.2  CL 109 107 108 97*  CO2 '24 24 25 26  '$ GLUCOSE 81 197* 147* 166*  BUN 40* 26* 25* 18  CALCIUM 7.9* 7.8* 7.9* 8.7*  CREATININE 1.34* 1.23 1.21 1.03  GFRNONAA 59* >60 >60 >60    LIVER FUNCTION TESTS: Recent Labs    10/04/22 0347 10/05/22 1058 10/06/22 0540 10/17/22 1353  BILITOT 0.8 0.9 0.9 0.7  AST 18 17 12* 17  ALT '12 12 11 15  '$ ALKPHOS 157* 196* 208* 297*  PROT 5.5* 6.2* 6.0* 7.3  ALBUMIN 2.0* 2.2* 2.1* 2.6*    TUMOR MARKERS: No results for input(s): "AFPTM", "CEA", "CA199", "CHROMGRNA" in the last 8760 hours.  Assessment and Plan:  Blanca Thornton is a 66 yo  male being seen today for image-guided abdominal mass biopsy in the setting of prostate cancer. The patient's case was reviewed and approved by Dr Denna Haggard. The patient is scheduled to undergo image-guided abdominal mass biopsy with Dr Serafina Royals on 10/31/22.  Risks and benefits of image-guided abdominal mass biopsy was discussed with the patient and/or patient's family including, but not limited to bleeding, infection, damage to adjacent structures or low yield requiring additional tests.  All of the questions were answered and there is agreement to proceed.  Consent signed and in chart.   Thank you for this interesting consult.  I greatly enjoyed meeting Roger [name] and look forward to participating in their care.  A copy of this report was sent to the requesting provider on this date.  Electronically Signed: Lura Em, PA-C 10/31/2022, 10:16 AM   I spent a total of  25 Minutes in face to face in clinical consultation, greater than 50% of which was counseling/coordinating care for image-guided abdominal mass biopsy.

## 2022-10-31 NOTE — Progress Notes (Signed)
10/31/2022 2:49 PM   [name] 03/23/1956 [id number]  Referring provider: Lahoma Rocker, MD [address] [place],   [address]  Urological history: 1. Advanced metastatic prostate cancer -PSA > 1500  -CT abdominal mass today  2. 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 male who presents today for ADT.   The procedure is discussed with patient.  He is allowed to ask questions.  Questions were answered to his satisfaction.     PMH: Past Medical History:  Diagnosis Date   Chest pain    a. 04/2016 MV: small, mild apical defect c/w apical ischemia, EF 55-65%-->Low risk study.   Diabetes mellitus without complication ([place])    Gout    Hematochezia    History of echocardiogram    a. 04/2016 Echo: EF 55-60%, mild LVH, no rwma, midly dil LA.   History of heart attack    Hypertension    Morbid obesity ([place])     Surgical History: Past Surgical History:  Procedure Laterality Date   COLONOSCOPY WITH PROPOFOL N/A 08/05/2018   Procedure: COLONOSCOPY WITH PROPOFOL;  Surgeon: Lin Landsman, MD;  Location: [facility] ENDOSCOPY;  Service: Gastroenterology;  Laterality: N/A;   thumb surgery      Home Medications:  Allergies as of 10/31/2022       Reactions   Albuterol Nausea And Vomiting   Mucinex [guaifenesin Er] Rash        Medication List        Accurate as of October 31, 2022  2:49 PM. If you have any questions, ask your nurse or doctor.          allopurinol 100 MG tablet Commonly known as: ZYLOPRIM Take 2 tablets (200 mg total) by mouth 2 (two) times daily.   aspirin EC 81 MG tablet Take 1 tablet (81 mg total) by mouth daily.   folic acid 1 MG tablet Commonly known as: FOLVITE Take 1 tablet (1 mg total) by mouth daily.   isosorbide mononitrate 30 MG 24 hr tablet Commonly known as: IMDUR TAKE 1 TABLET(30 MG) BY MOUTH  DAILY   metFORMIN 500 MG tablet Commonly known as: GLUCOPHAGE Take 1 tablet (500 mg total) by mouth 2 (two) times daily with a meal.   metoprolol tartrate 25 MG tablet Commonly known as: LOPRESSOR Take 1 tablet (25 mg total) by mouth 2 (two) times daily.   oxyCODONE 5 MG immediate release tablet Commonly known as: Roxicodone Take 1 tablet (5 mg total) by mouth every 6 (six) hours as needed for severe pain.   oxyCODONE-acetaminophen 5-325 MG tablet Commonly known as: PERCOCET/ROXICET Take 1 tablet by mouth every 4 (four) hours as needed for moderate pain or severe pain.   pantoprazole 40 MG tablet Commonly known as: PROTONIX Take 1 tablet (40 mg total) by mouth daily before breakfast.   polyethylene glycol 17 g packet Commonly known as: MIRALAX / GLYCOLAX Take 17 g by mouth daily as needed for mild constipation or moderate constipation.   rosuvastatin 10 MG tablet Commonly known as: Crestor Take 1 tablet (10 mg total) by mouth daily.   triamterene-hydrochlorothiazide 37.5-25 MG tablet Commonly known as: MAXZIDE-25 Take 1 tablet by mouth daily.        Allergies:  Allergies  Allergen Reactions   Albuterol Nausea And Vomiting   Mucinex [Guaifenesin Er] Rash    Family History:  Family History  Problem Relation Age of Onset   Heart Problems Mother    Breast cancer Mother    Mental illness Father    Heart disease Father    Stroke Father    Diabetes Father    Alcohol abuse Father    Bipolar disorder Father    Heart disease Other    Hypertension Other    Diabetes Other     Social History:  reports that he quit smoking about 29 years ago. His smoking use included cigarettes. He has a 6.25 pack-year smoking history. He has never used smokeless tobacco. He reports current alcohol use of about 42.0 standard drinks of alcohol per week. He reports that he does not use drugs.  ROS: Pertinent ROS in HPI  Physical Exam: Constitutional:  Well nourished. Alert and  oriented, No acute distress. HEENT: [place] AT, moist mucus membranes.  Trachea midline, no masses. Cardiovascular: No clubbing, cyanosis, or edema. Respiratory: Normal respiratory effort, no increased work of breathing. Neurologic: Grossly intact, no focal deficits, moving all 4 extremities. Psychiatric: Normal mood and affect.  Laboratory Data: Lab Results  Component Value Date   WBC 14.6 (H) 10/31/2022   HGB 9.4 (L) 10/31/2022   HCT 29.5 (L) 10/31/2022   MCV 96.4 10/31/2022   PLT 300 10/31/2022    Lab Results  Component Value Date   CREATININE 1.03 10/17/2022    Lab Results  Component Value Date   HGBA1C 6.5 (H) 09/27/2022    Lab Results  Component Value Date   AST 17 10/17/2022   Lab Results  Component Value Date   ALT 15 10/17/2022  I have reviewed the labs.   Pertinent Imaging N/A  Cath Change/ Replacement  Patient is present today for a catheter change due to urinary retention.  8 ml of water was removed from the balloon, a 14 FR foley cath was removed without difficulty.  Patient was cleaned and prepped in a sterile fashion with betadine and 2% lidocaine jelly was instilled into the urethra. A 16  FR Coude foley cath was replaced into the bladder, no complications were noted.  Catheter was irrigated to ensure proper placement.  The balloon was filled with 38m of sterile water. A night bag was attached for drainage.  Patient was given proper instruction on catheter care.    Performed by: SZara Council PA-C and CEvelina Bucy CMA and CKyra Manges CMA  Assessment & Plan:    1. Advanced metastatic prostate cancer -Discussed with patient injection site reactions could include transient burning and stinging, pain, bruising and redness -Discussed with patient that common side effects include hot flashes, sweats, fatigue, weakness, muscle pain, dizziness, clamminess, testicular shrinkage, decreased erections and enlargement of breasts -Discussed the side  effect of thinning of the bones that may lead to a fracture and it is important that he start calcium supplementation with vitamin D -Discussed the increased risks of heart attack, irregular heartbeat, sudden death due to heart problems and stroke -Discussed that elevated blood sugar and increased risk developing diabetes may occur  -loading dose FMills Kolleris given to patient -return in one month to switch to Eligard and check PSA   2. Urinary retention -managed with a Foley catheter for the time being -may consider a voiding trial in the future -return in one month for catheter exchange  Return in about 1 month (around 12/01/2022) for PSA, Foley catheter exchange and Eligard .  These notes generated with voice recognition software. I apologize for  typographical errors.  Royden Purl  [place] [address]  [place], [place] [address] [phone]   I spent 30 minutes on the day of the encounter to include pre-visit record review, face-to-face time with the patient, and post-visit ordering of tests.

## 2022-11-02 LAB — SURGICAL PATHOLOGY

## 2022-11-05 ENCOUNTER — Telehealth: Payer: Self-pay | Admitting: *Deleted

## 2022-11-05 MED ORDER — APALUTAMIDE 240 MG PO TABS: 240.0000 mg | ORAL_TABLET | Freq: Every day | ORAL | 0 refills | Status: DC

## 2022-11-05 NOTE — Telephone Encounter (Signed)
Pt calling asking when he will be released from the facility in [place]. I advised pt that Dr.Brandon doesn't have any dates for possible release. I advised to pt that the facility will facilitate that for him when he's ready. Pt asking how can he save his job and home if he's in the facility. I asked pt to speak with Education officer, museum. Pt also asking about his results from his abdominal mass biopsy.

## 2022-11-05 NOTE — Telephone Encounter (Signed)
Received confirmation that [facility] has received my rx for Alford [place]land and they are working on Special educational needs teacher.

## 2022-11-05 NOTE — Telephone Encounter (Signed)
Biopsy consistent with prostate cancer.  I would also like to get him started on a second treatment medication called apalutimide.  Please order (initial loading dose then daily).  I can take weeks to get approved and we will take to him more about it at his next visit for eligard and voiding trial.  Hollice Espy, MD

## 2022-11-05 NOTE — Telephone Encounter (Signed)
Spoke with patient and advised results. He hope to be out of the facility by his next appt in January. I also advised that I sent the medication Alford Highland to [facility]. I will also start the prior auth once [facility] let me know if it's covered or not.

## 2022-11-06 ENCOUNTER — Other Ambulatory Visit: Payer: Self-pay

## 2022-11-06 ENCOUNTER — Telehealth: Payer: Self-pay | Admitting: Oncology

## 2022-11-06 DIAGNOSIS — R972 Elevated prostate specific antigen [PSA]: Secondary | ICD-10-CM

## 2022-11-06 NOTE — Telephone Encounter (Signed)
[place] req from MD to have pt scheduled, pt communicated that he is currently at physical therapy facility and They would need to coordinate appt. Spoke with rep of facility who took my contact info to call back to notify if facility would accomodate travels.

## 2022-11-09 NOTE — Telephone Encounter (Signed)
Received notification that [facility] can not do Erleada for patient and they have forwarded this to [facility]

## 2022-11-13 ENCOUNTER — Telehealth: Payer: Self-pay

## 2022-11-13 ENCOUNTER — Telehealth: Payer: Self-pay | Admitting: Oncology

## 2022-11-13 NOTE — Telephone Encounter (Signed)
TC to patient to advise of new palliative care referral and to schedule in home visit. During call patient shared that he was in need of a lot of help, as he does not have any medications and is not able to do a lot of things for himself since being Dc'd from SNF. Patient also shared that he has a f/u oncology appt on Thu 12/21 that he does not have transportation to get yo as he is not able to drive.  Initial PC visit scheduled for 12/21 '@830'$  am with RN/SW team.

## 2022-11-13 NOTE — Telephone Encounter (Signed)
[place], from [facility] called and stated the pt does not have transportation for this week. They stated they wanted to get him set up for transportation services with [place]. But when would be the best day to get him back in next week since Dr.Yu's schedule is full?   [place]- [phone]

## 2022-11-13 NOTE — Telephone Encounter (Signed)
TC to oncology office to make aware that per patient is unable to get to his appt scheudled for Thu 12/21 '@1pm'$  due to transportation issues and not being able to drive. Patient request to reschedule appt. SW was told that a message would be sent to provider about rescheduling, and that cancer office has a transportation program that patient could use for future appts.

## 2022-11-13 NOTE — Telephone Encounter (Signed)
[place] on patient to behalf to inquire of any medications ready to be picked. pharm tech stated they do not have any medications called in or ready for patient. SW made patient aware.

## 2022-11-15 ENCOUNTER — Inpatient Hospital Stay: Payer: 59

## 2022-11-15 ENCOUNTER — Inpatient Hospital Stay: Payer: 59 | Attending: Oncology

## 2022-11-15 ENCOUNTER — Inpatient Hospital Stay (HOSPITAL_BASED_OUTPATIENT_CLINIC_OR_DEPARTMENT_OTHER): Payer: 59 | Admitting: Oncology

## 2022-11-15 ENCOUNTER — Encounter: Payer: Self-pay | Admitting: Oncology

## 2022-11-15 ENCOUNTER — Other Ambulatory Visit: Payer: Self-pay | Admitting: Family Medicine

## 2022-11-15 ENCOUNTER — Other Ambulatory Visit: Payer: 59

## 2022-11-15 ENCOUNTER — Other Ambulatory Visit: Payer: Self-pay

## 2022-11-15 VITALS — BP 96/62 | HR 107 | Temp 97.5°F

## 2022-11-15 VITALS — BP 116/81 | HR 125 | Temp 98.0°F | Resp 18 | Wt 168.0 lb

## 2022-11-15 DIAGNOSIS — E538 Deficiency of other specified B group vitamins: Secondary | ICD-10-CM | POA: Insufficient documentation

## 2022-11-15 DIAGNOSIS — C61 Malignant neoplasm of prostate: Secondary | ICD-10-CM | POA: Insufficient documentation

## 2022-11-15 DIAGNOSIS — C7951 Secondary malignant neoplasm of bone: Secondary | ICD-10-CM | POA: Insufficient documentation

## 2022-11-15 DIAGNOSIS — Z515 Encounter for palliative care: Secondary | ICD-10-CM

## 2022-11-15 DIAGNOSIS — D649 Anemia, unspecified: Secondary | ICD-10-CM | POA: Insufficient documentation

## 2022-11-15 DIAGNOSIS — R972 Elevated prostate specific antigen [PSA]: Secondary | ICD-10-CM

## 2022-11-15 DIAGNOSIS — Z789 Other specified health status: Secondary | ICD-10-CM

## 2022-11-15 DIAGNOSIS — R531 Weakness: Secondary | ICD-10-CM

## 2022-11-15 DIAGNOSIS — R5381 Other malaise: Secondary | ICD-10-CM

## 2022-11-15 DIAGNOSIS — M109 Gout, unspecified: Secondary | ICD-10-CM | POA: Diagnosis not present

## 2022-11-15 DIAGNOSIS — Z91148 Patient's other noncompliance with medication regimen for other reason: Secondary | ICD-10-CM

## 2022-11-15 DIAGNOSIS — D638 Anemia in other chronic diseases classified elsewhere: Secondary | ICD-10-CM

## 2022-11-15 LAB — IRON AND TIBC
Iron: 39 ug/dL — ABNORMAL LOW (ref 45–182)
Saturation Ratios: 21 % (ref 17.9–39.5)
TIBC: 186 ug/dL — ABNORMAL LOW (ref 250–450)
UIBC: 147 ug/dL

## 2022-11-15 LAB — COMPREHENSIVE METABOLIC PANEL
ALT: 8 U/L (ref 0–44)
AST: 19 U/L (ref 15–41)
Albumin: 3.1 g/dL — ABNORMAL LOW (ref 3.5–5.0)
Alkaline Phosphatase: 650 U/L — ABNORMAL HIGH (ref 38–126)
Anion gap: 13 (ref 5–15)
BUN: 16 mg/dL (ref 8–23)
CO2: 24 mmol/L (ref 22–32)
Calcium: 8.6 mg/dL — ABNORMAL LOW (ref 8.9–10.3)
Chloride: 94 mmol/L — ABNORMAL LOW (ref 98–111)
Creatinine, Ser: 1 mg/dL (ref 0.61–1.24)
GFR, Estimated: >60 mL/min (ref >60)
Glucose, Bld: 165 mg/dL — ABNORMAL HIGH (ref 70–99)
Potassium: 3.9 mmol/L (ref 3.5–5.1)
Sodium: 131 mmol/L — ABNORMAL LOW (ref 135–145)
Total Bilirubin: 0.8 mg/dL (ref 0.3–1.2)
Total Protein: 7.6 g/dL (ref 6.5–8.1)

## 2022-11-15 LAB — CBC WITH DIFFERENTIAL/PLATELET
Abs Immature Granulocytes: 0.1 10*3/uL — ABNORMAL HIGH (ref 0.00–0.07)
Basophils Absolute: 0.1 10*3/uL (ref 0.0–0.1)
Basophils Relative: 0 %
Eosinophils Absolute: 0.1 10*3/uL (ref 0.0–0.5)
Eosinophils Relative: 1 %
HCT: 27.6 % — ABNORMAL LOW (ref 39.0–52.0)
Hemoglobin: 9 g/dL — ABNORMAL LOW (ref 13.0–17.0)
Immature Granulocytes: 1 %
Lymphocytes Relative: 12 %
Lymphs Abs: 1.7 10*3/uL (ref 0.7–4.0)
MCH: 31.1 pg (ref 26.0–34.0)
MCHC: 32.6 g/dL (ref 30.0–36.0)
MCV: 95.5 fL (ref 80.0–100.0)
Monocytes Absolute: 0.5 10*3/uL (ref 0.1–1.0)
Monocytes Relative: 4 %
Neutro Abs: 11 10*3/uL — ABNORMAL HIGH (ref 1.7–7.7)
Neutrophils Relative %: 82 %
Platelets: 293 10*3/uL (ref 150–400)
RBC: 2.89 MIL/uL — ABNORMAL LOW (ref 4.22–5.81)
RDW: 16.3 % — ABNORMAL HIGH (ref 11.5–15.5)
WBC: 13.4 10*3/uL — ABNORMAL HIGH (ref 4.0–10.5)
nRBC: 0 % (ref 0.0–0.2)

## 2022-11-15 LAB — RETIC PANEL
Immature Retic Fract: 13.9 % (ref 2.3–15.9)
RBC.: 2.89 MIL/uL — ABNORMAL LOW (ref 4.22–5.81)
Retic Count, Absolute: 91 10*3/uL (ref 19.0–186.0)
Retic Ct Pct: 3.2 % — ABNORMAL HIGH (ref 0.4–3.1)
Reticulocyte Hemoglobin: 32.5 pg (ref >27.9)

## 2022-11-15 LAB — PSA: Prostatic Specific Antigen: >1500 ng/mL — ABNORMAL HIGH (ref 0.00–4.00)

## 2022-11-15 LAB — FERRITIN: Ferritin: 2303 ng/mL — ABNORMAL HIGH (ref 24–336)

## 2022-11-15 MED ORDER — VITAMIN B-12 1000 MCG PO TABS: 1000.0000 ug | ORAL_TABLET | Freq: Every day | ORAL | 0 refills | Status: DC

## 2022-11-15 MED ORDER — APALUTAMIDE 240 MG PO TABS: 240.0000 mg | ORAL_TABLET | Freq: Every day | ORAL | 0 refills | Status: DC

## 2022-11-15 NOTE — Telephone Encounter (Signed)
Form placed on Dr Cherrie Gauze desk to sign

## 2022-11-15 NOTE — Telephone Encounter (Signed)
[employer] in regards to [place] as we do not have documentation on the approval and it was approved, Dates 09/26/22-09/27/23. PA number [id number]  Spoke with [facility] at [phone] in regards to Erleada 240 mg RX PA is needed, I was not able to obtain this over the phone and asked to get form faxed to [place] to work on this. I also re sent RX for this to [facility] as they already closed this case per their fax due to not getting benefit information from [place] on time. waiting on the form

## 2022-11-15 NOTE — Telephone Encounter (Signed)
PA faxed to [facility], pending decision.

## 2022-11-15 NOTE — Progress Notes (Signed)
Patient here for follow up. Pt reports that catheter is leaking.

## 2022-11-15 NOTE — Progress Notes (Signed)
COMMUNITY PALLIATIVE CARE SW NOTE  PATIENT NAME: Roger [name] DOB: Sep 07, 1956 MRN: [id number]  PRIMARY CARE PROVIDER: Lahoma Rocker, MD  RESPONSIBLE PARTY:  Acct ID - Guarantor Home Phone Work Phone Relationship Acct Type  [id number] ALVIE, [id number] Self P/F     [place], [place], [place] [address]     PLAN OF CARE and INTERVENTIONS:             GOALS OF CARE/ ADVANCE CARE PLANNING:  Goals include to maximize quality of life and assist with pain management. Our advance care planning conversation included a discussion about:    The value and importance of advance care planning  Review and updating or creation of an advance directive document.                       Patient is a full code. ACD discussed with patient, SW will mail patient ACD forms.  2.    SOCIAL/EMOTIONAL/SPIRITUAL ASSESSMENT/ INTERVENTIONS:         Palliative care encounter: SW and RN completed joint initial in home visit with patient.   Functional changes/updates: patient with new dx of prostate cancer. Patient recently DC from [facility] 11/01/22 and has been weak and nonambulatory since. Patient share that he is in need of assistance in regard to acquiring medications and possible home health services.  Falls: patient reports a fall a couple days ago attempting to transfer. Patient can benefit from [facility] therapy.  Medications: patient needs medications addressed with cancer provider.  Hospice: Patient originally had hospice services and eligibility discussed by patient liaison, of which patients oncologist felt patient would be more appropriate for palliative care services at this time. Patient is open to hospice when appropriate.  PC will continue to monitor for hospice eligibility.    Psychosocial assessment: completed.   In home support: patient resides in home alone. Patient has family members that can provide support as needed but not on an ongoing  basis.  Transportation: patient used to drive however he is not able to at this time. Cancer center is able to provide transportation.  Food: no food insecurities witnessed. However, patient shares that his appetite is poor.  Safety and long term planning: patient feels safe in his home and desires to remain in his home with spouse.    SW discussed goals, reviewed care plan, provided emotional support, used active and reflective listening in the form of reciprocity emotional response. Questions and concerns were addressed. The patient/family was encouraged to call with any additional questions and/or concerns. PC Provided general support and encouragement, no other unmet needs identified. Will continue to follow.   3.         PATIENT/CAREGIVER EDUCATION/ COPING:   Appearance: well groomed, appropriate given situation  Mental Status: Alert/oriented. Eye Contact: Good. Able to engage in proper eye contact  Thought Process: rational  Thought Content: not assessed  Speech: normal  Mood: Normal and calm Affect: Congruent to endorsed mood, full ranging Insight: normal Judgement: normal  Interaction Style: Cooperative   Patient A&O, patient engaged in fluent conversation and answered all questions appropriately. Patient worked full time up until his hospitalization, patient is having difficulty adjusting to his recent physical and functional decline and no longer being able to work. Patient states that his employer has provided him STD paperwork, however patient is hopeful to return to work soon,   4.         PERSONAL  EMERGENCY PLAN:  Patient will call 9-1-1 for emergencies.    5.         COMMUNITY RESOURCES COORDINATION/ HEALTH CARE NAVIGATION:  patients manages his care.    6.      FINANCIAL CONCERNS/NEEDS: None                         Primary Health Insurance:  [employer] through employer Secondary Health Insurance: none Prescription Coverage: Yes, no history of difficulty obtaining or  affording prescriptions reported.     SOCIAL HX:  Social History   Tobacco Use   Smoking status: Former    Packs/day: 0.25    Years: 25.00    Total pack years: 6.25    Types: Cigarettes    Quit date: 1994    Years since quitting: 29.9   Smokeless tobacco: Never  Substance Use Topics   Alcohol use: Yes    Alcohol/week: 42.0 standard drinks of alcohol    Types: 10 Cans of beer, 32 Shots of liquor per week    Comment: everyday. 3-5 servings whiskey nightly & beer    CODE STATUS: Full code  ADVANCED DIRECTIVES: N. discussed MOST FORM COMPLETE:  N HOSPICE EDUCATION PROVIDED: Y  PPS: patient is mod A with ADL's.  Time spent 24mn      [place]

## 2022-11-15 NOTE — Progress Notes (Signed)
PATIENT NAME: Roger [name] DOB: Sep 08, 1956 MRN: [id number]  PRIMARY CARE PROVIDER: Lahoma Rocker, MD  RESPONSIBLE PARTY:  Acct ID - Guarantor Home Phone Work Phone Relationship Acct Type  [id number] Roger [name], Roger [name] [id number] Self P/F     [address] [place], [place] [address]    ACP:  Discussion completed.  Patient desires a full code status at this time.  Does not have HCPOA designated.  Palliative Care SW will provide documents for patient to complete.  Appetite:  Poor with limited po intake.  Last took a bite of rice last night.  Prior to this 3 days ago patient ate.  States he does not have an appetite.  Liquid intake is also poor.  Patient to be seen today at the cancer center for blood work.  Hospice vs Palliative:  Patient was seen by [facility] on 11/13/22 for informational visit.  Patient is aware Dr. Tasia Catchings recommended Palliative Care at this time.  Patient advises he reached out to hospice because he needed help in the home.  He is aware of the differences between both programs and would like to see what Dr. Tasia Catchings recommends.  Weakness:  Patient is very limited in the home.  Requires 1 person assistance with ADL's.  Recent fall in the home.  No injuries reported.  Patient reports grandsons assisted him up.  Now has a lift chair in the home.  Also has a walker and wheelchair.  Would benefit from [facility] therapy for strengthening.   CODE STATUS: Full ADVANCED DIRECTIVES: No MOST FORM: No PPS: 40%   PHYSICAL EXAM:   VITALS: Today's Vitals   11/15/22 0843  BP: 96/62  Pulse: (!) 107  Temp: (!) 97.5 F (36.4 C)  SpO2: 95%    LUNGS: clear to auscultation  CARDIAC: Cor Tachy}  EXTREMITIES: - for edema SKIN: Skin color, texture, turgor normal. No rashes or lesions or normal  NEURO: positive for gait problems and weakness       Lorenza Burton, RN

## 2022-11-17 DIAGNOSIS — Z91148 Patient's other noncompliance with medication regimen for other reason: Secondary | ICD-10-CM | POA: Insufficient documentation

## 2022-11-17 DIAGNOSIS — R5381 Other malaise: Secondary | ICD-10-CM | POA: Insufficient documentation

## 2022-11-17 NOTE — Addendum Note (Signed)
Addended by: Earlie Server on: 11/17/2022 07:17 PM   Modules accepted: Orders

## 2022-11-17 NOTE — Assessment & Plan Note (Signed)
Recommend patient to take folic acid supplementation.

## 2022-11-17 NOTE — Assessment & Plan Note (Signed)
Chronic anemia, check iron TIBC, ferritin, retic panel.

## 2022-11-17 NOTE — Assessment & Plan Note (Signed)
Recommend alcohol cessation. 

## 2022-11-17 NOTE — Assessment & Plan Note (Signed)
Refer to home health for home physical therapy.

## 2022-11-17 NOTE — Assessment & Plan Note (Signed)
Check uric acid level. - elevated.  He is not complaint with allopurinol and Cochicine. Recommend him to resume.  Recommend him to follow up with PCP for further evaluation. Message sent to his PCP

## 2022-11-17 NOTE — Assessment & Plan Note (Addendum)
CT images and pathology were reviewed with patient.  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  Recommend androgen deprivation therapy- he started with loading dose of Firmagon, he will follow up with urology in 4 week to start Eligard Q6 month.  Due to his high disease burden, I recommend patient to start on a second systemic agent He is a not a candidate for Decetaxel treatment, recommend androgen receptor inhibitor. Dr.Brandon has prescribed Apalutamide '240mg'$  daily and I agree with this recommendation. Discussed with Dr.Brandon, she will manage his ADT and Apalutamide. She will refer patient back to me when he develops castration resistance disease.

## 2022-11-17 NOTE — Assessment & Plan Note (Signed)
Patient has difficulty following instruction for all his medications. I will refer him to home heath for medicine management.

## 2022-11-17 NOTE — Progress Notes (Addendum)
Hematology/Oncology Progress note [phone] [phone]    ASSESSMENT & PLAN:   Cancer Staging  Prostate cancer metastatic to bone [facility]) Staging form: Prostate, AJCC 8th Edition - Clinical stage from 10/31/2022: Stage IVB (cN1, cM1b, PSA: 1500) - Signed by Earlie Server, MD on 11/17/2022   Alcohol use Recommend alcohol cessation.   Folate deficiency Recommend patient to take folic acid supplementation.   Acute gout Check uric acid level. - elevated.  He is not complaint with allopurinol and Cochicine. Recommend him to resume.  Recommend him to follow up with PCP for further evaluation. Message sent to his PCP  Prostate cancer metastatic to bone [facility]) CT images and pathology were reviewed with patient.  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  Recommend androgen deprivation therapy- he started with loading dose of Firmagon, he will follow up with urology in 4 week to start Eligard Q6 month.  Due to his high disease burden, I recommend patient to start on a second systemic agent He is a not a candidate for Decetaxel treatment, recommend androgen receptor inhibitor. Dr.Brandon has prescribed Apalutamide '240mg'$  daily and I agree with this recommendation. Discussed with Dr.Brandon, she will manage his ADT and Apalutamide. She will refer patient back to me when he develops castration resistance disease.   Anemia, chronic disease Chronic anemia, check iron TIBC, ferritin, retic panel.   Noncompliance with medication regimen Patient has difficulty following instruction for all his medications. I will refer him to home heath for medicine management.   Physical deconditioning Refer to home health for home physical therapy.    Orders Placed This Encounter  Procedures   Iron and TIBC    Standing Status:   Future    Number of Occurrences:   1    Standing Expiration Date:   11/16/2023   Ferritin    Standing Status:    Future    Number of Occurrences:   1    Standing Expiration Date:   11/16/2023   Retic Panel    Standing Status:   Future    Number of Occurrences:   1    Standing Expiration Date:   11/16/2023   Ambulatory referral to Home Health    Referral Priority:   Routine    Referral Type:   Home Health Care    Referral Reason:   Specialty Services Required    Requested Specialty:   [place]    Number of Visits Requested:   1   Follow up in 6 weeks.  All questions were answered. The patient knows to call the clinic with any problems, questions or concerns.  Earlie Server, MD, PhD [facility] Hematology Oncology 11/15/2022   CHIEF COMPLAINTS/PURPOSE OF CONSULTATION:  Metastatic prostate cancer.   INTERVAL HISTORY Roger [name] is a 66 y.o. male who has above history reviewed by me today presents for follow up visit for metastatic prostate cancer.   Oncology History  Prostate cancer metastatic to bone ([place])  09/08/2022 Imaging   CT abdomen pelvis w contrast showed  1. Mild bilateral hydronephrosis and distended urinary bladder, consistent with bladder outlet obstruction and hydronephrosis secondary to back pressure. No urinary tract calculi. 2. Prostatomegaly with indistinct margins, particularly at the prostatic base. 3. Numerous enlarged bilateral pelvic sidewall, iliac, and retroperitoneal lymph nodes. 4. 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 5. Constellation of findings is consistent prostate malignancy and associated metastatic disease. 6. Hepatic steatosis. 7. Cholelithiasis without evidence  of acute cholecystitis. 8. Pancolonic diverticulosis without evidence of acute diverticulitis.Aortic Atherosclerosis   09/08/2022 Tumor Marker   PSA 1500   09/27/2022 Initial Diagnosis   Prostate cancer metastatic to bone  09/08/22 Patient recently presented to ER for evaluation of urinary difficulty, gout flare/foot swelling.   Lower extremity US in ER negative for blood clot. 09/08/22 CT scan showed hydronephrosis, prostatomegaly, severe pelvic lymphadenopathy, widespread sclerotic osseous lesions. A Foley catheter was placed and patient followed up with urology Dr.Brandon on 09/20/22, PSMA PET was ordered, and planned to get biopsy for tissue diagnosis.  09/24/22 established care with oncology  His work up for prostate cancer was delayed due to hospitalization due to severe sepsis and later again due to chest pain work up in ED.   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  Blood cultures grewout E. coli.  Urine culture also positive for E. Coli, Klebsiella oxytoca, Enterococcus faecalis.  He was treated with antibiotics Rocephin>changed to Unasyn.  Planned lymphadenopathy biopsy was delayed due to severe sepsis.  He was also transfused with PRBC 1 unit due to acute drop of hemoglobin.     10/17/2022 Imaging   CT angio chest pulmonary embolism protocol 1. No evidence acute pulmonary embolism. 2. Widespread sclerotic skeletal metastasis similar to CT 2014. 3. Lymph node along the descending thoracic aorta could represent a metastatic prostate cancer lymph node. 4. Lobular soft tissue on either side of the first RIGHT rib and sternal junction associated with erosion of the joint. Favor inflammatory arthropathy rather than a metastatic lesion. Patient's history of gout which may contribute to this finding.   10/31/2022 Cancer Staging   Staging form: Prostate, AJCC 8th Edition - Clinical stage from 10/31/2022: Stage IVB (cN1, cM1b, PSA: 1500) - Signed by Earlie Server, MD on 11/17/2022 Stage prefix: Initial diagnosis Prostate specific antigen (PSA) range: 20 or greater   10/31/2022 Miscellaneous    Started on loading dose of Firmagon at urologist's office      Today he was accompanied by a male friend.  He feels tired and fatigued. Body pain has improved.  + Foley catheter leakage.  He report not taking any medication currently.   MEDICAL HISTORY:  Past Medical History:  Diagnosis Date   Chest pain    a. 04/2016 MV: small, mild apical defect c/w apical ischemia, EF 55-65%-->Low risk study.   Diabetes mellitus without complication ([place])    Gout    Hematochezia    History of echocardiogram    a. 04/2016 Echo: EF 55-60%, mild LVH, no rwma, midly dil LA.   History of heart attack    Hypertension    Morbid obesity ([place])     SURGICAL HISTORY: Past Surgical History:  Procedure Laterality Date   COLONOSCOPY WITH PROPOFOL N/A 08/05/2018   Procedure: COLONOSCOPY WITH PROPOFOL;  Surgeon: Lin Landsman, MD;  Location: [facility] ENDOSCOPY;  Service: Gastroenterology;  Laterality: N/A;   thumb surgery      SOCIAL HISTORY: Social History   Socioeconomic History   Marital status: Divorced    Spouse name: Not on file   Number of children: 5   Years of education: Not on file   Highest education level: High school graduate  Occupational History   Not on file  Tobacco Use   Smoking status: Former  Packs/day: 0.25    Years: 25.00    Total pack years: 6.25    Types: Cigarettes    Quit date: 19    Years since quitting: 29.9   Smokeless tobacco: Never  Vaping Use   Vaping Use: Never used  Substance and Sexual Activity   Alcohol use: Yes    Alcohol/week: 42.0 standard drinks of alcohol    Types: 10 Cans of beer, 32 Shots of liquor per week    Comment: everyday. 3-5 servings whiskey nightly & beer   Drug use: No   Sexual activity: Not Currently  Other Topics Concern   Not on file  Social History Narrative   Lives at SNF.   Social Determinants of Health   Financial Resource Strain: Not on file  Food Insecurity: Not on file  Transportation Needs: Unmet  Transportation Needs (11/15/2022)   PRAPARE - Hydrologist (Medical): Yes    Lack of Transportation (Non-Medical): Yes  Physical Activity: Not on file  Stress: Not on file  Social Connections: Not on file  Intimate Partner Violence: Not on file    FAMILY HISTORY: Family History  Problem Relation Age of Onset   Heart Problems Mother    Breast cancer Mother    Mental illness Father    Heart disease Father    Stroke Father    Diabetes Father    Alcohol abuse Father    Bipolar disorder Father    Heart disease Other    Hypertension Other    Diabetes Other     ALLERGIES:  is allergic to albuterol and mucinex [guaifenesin er].  MEDICATIONS:  Current Outpatient Medications  Medication Sig Dispense Refill   allopurinol (ZYLOPRIM) 100 MG tablet Take 2 tablets (200 mg total) by mouth 2 (two) times daily. 120 tablet 0   aspirin EC 81 MG tablet Take 1 tablet (81 mg total) by mouth daily. 30 tablet 0   cyanocobalamin (VITAMIN B12) 1000 MCG tablet Take 1 tablet (1,000 mcg total) by mouth daily. 90 tablet 0   isosorbide mononitrate (IMDUR) 30 MG 24 hr tablet TAKE 1 TABLET(30 MG) BY MOUTH DAILY 30 tablet 0   metFORMIN (GLUCOPHAGE) 500 MG tablet Take 1 tablet (500 mg total) by mouth 2 (two) times daily with a meal. 60 tablet 0   metoprolol tartrate (LOPRESSOR) 25 MG tablet Take 1 tablet (25 mg total) by mouth 2 (two) times daily. 30 tablet 0   oxyCODONE (ROXICODONE) 5 MG immediate release tablet Take 1 tablet (5 mg total) by mouth every 6 (six) hours as needed for severe pain. 12 tablet 0   oxyCODONE-acetaminophen (PERCOCET/ROXICET) 5-325 MG tablet Take 1 tablet by mouth every 4 (four) hours as needed for moderate pain or severe pain. 20 tablet 0   pantoprazole (PROTONIX) 40 MG tablet Take 1 tablet (40 mg total) by mouth daily before breakfast.     rosuvastatin (CRESTOR) 10 MG tablet Take 1 tablet (10 mg total) by mouth daily. 30 tablet 0   apalutamide (ERLEADA) 240  MG tablet Take 1 tablet (240 mg total) by mouth daily. 90 tablet 0   febuxostat (ULORIC) 40 MG tablet Take 40 mg by mouth daily.     folic acid (FOLVITE) 1 MG tablet Take 1 tablet (1 mg total) by mouth daily. 30 tablet 2   leuprolide, 6 Month, (LEUPROLIDE ACETATE, 6 MONTH,) 45 MG injection Inject 45 mg into the skin every 6 (six) months. (Patient not taking: Reported on 11/15/2022) 1 each  1   polyethylene glycol (MIRALAX / GLYCOLAX) 17 g packet Take 17 g by mouth daily as needed for mild constipation or moderate constipation. (Patient not taking: Reported on 11/15/2022) 14 each 0   triamterene-hydrochlorothiazide (MAXZIDE-25) 37.5-25 MG tablet Take 1 tablet by mouth daily. 30 tablet 0   No current facility-administered medications for this visit.    Review of Systems  Constitutional:  Positive for fatigue. Negative for appetite change, chills, fever and unexpected weight change.       Ambulation difficulty due to pain  HENT:   Negative for hearing loss and voice change.   Eyes:  Negative for eye problems and icterus.  Respiratory:  Negative for chest tightness, cough and shortness of breath.   Cardiovascular:  Negative for chest pain and leg swelling.  Gastrointestinal:  Negative for abdominal distention and abdominal pain.  Endocrine: Negative for hot flashes.  Genitourinary:         Indwelling catheter, leaking  Musculoskeletal:  Positive for arthralgias.       Right foot pain, swelling  Skin:  Negative for itching and rash.  Neurological:  Negative for light-headedness and numbness.  Hematological:  Negative for adenopathy. Does not bruise/bleed easily.  Psychiatric/Behavioral:  Negative for confusion.      PHYSICAL EXAMINATION: ECOG PERFORMANCE STATUS: 2 - Symptomatic, <50% confined to bed  Vitals:   11/15/22 1351  BP: 116/81  Pulse: (!) 125  Resp: 18  Temp: 98 F (36.7 C)  SpO2: 97%   Filed Weights   11/15/22 1351  Weight: 168 lb (76.2 kg)    Physical  Exam Constitutional:      General: He is not in acute distress.    Appearance: He is obese. He is not diaphoretic.  HENT:     Head: Normocephalic and atraumatic.     Nose: Nose normal.     Mouth/Throat:     Pharynx: No oropharyngeal exudate.  Eyes:     General: No scleral icterus.    Pupils: Pupils are equal, round, and reactive to light.  Cardiovascular:     Rate and Rhythm: Normal rate.     Heart sounds: No murmur heard. Pulmonary:     Effort: Pulmonary effort is normal. No respiratory distress.     Breath sounds: No rales.  Chest:     Chest wall: No tenderness.  Abdominal:     General: There is no distension.     Palpations: Abdomen is soft.     Tenderness: There is no abdominal tenderness.  Musculoskeletal:        General: Normal range of motion.     Cervical back: Normal range of motion.     Comments: Right foot swelling and tenderness.   Skin:    General: Skin is warm and dry.     Findings: No erythema.  Neurological:     Mental Status: He is alert and oriented to person, place, and time.     Cranial Nerves: No cranial nerve deficit.     Motor: No abnormal muscle tone.  Psychiatric:        Mood and Affect: Mood and affect normal.     LABORATORY DATA:  I have reviewed the data as listed    Latest Ref Rng & Units 11/15/2022    1:22 PM 10/31/2022   10:05 AM 10/17/2022    1:53 PM  CBC  WBC 4.0 - 10.5 K/uL 13.4  14.6  19.9   Hemoglobin 13.0 - 17.0 g/dL 9.0  9.4  8.4   Hematocrit 39.0 - 52.0 % 27.6  29.5  26.1   Platelets 150 - 400 K/uL 293  300  298       Latest Ref Rng & Units 11/15/2022    1:22 PM 10/17/2022    1:53 PM 10/06/2022    5:40 AM  CMP  Glucose 70 - 99 mg/dL 165  166  147   BUN 8 - 23 mg/dL '16  18  25   '$ Creatinine 0.61 - 1.24 mg/dL 1.00  1.03  1.21   Sodium 135 - 145 mmol/L 131  131  138   Potassium 3.5 - 5.1 mmol/L 3.9  4.2  4.5   Chloride 98 - 111 mmol/L 94  97  108   CO2 22 - 32 mmol/L '24  26  25   '$ Calcium 8.9 - 10.3 mg/dL 8.6  8.7  7.9    Total Protein 6.5 - 8.1 g/dL 7.6  7.3  6.0   Total Bilirubin 0.3 - 1.2 mg/dL 0.8  0.7  0.9   Alkaline Phos 38 - 126 U/L 650  297  208   AST 15 - 41 U/L '19  17  12   '$ ALT 0 - 44 U/L '8  15  11     '$ RADIOGRAPHIC STUDIES: I have personally reviewed the radiological images as listed and agreed with the findings in the report. CT ABDOMINAL MASS BIOPSY  Result Date: 10/31/2022 INDICATION: 66 year old male with history of prostate cancer and multifocal retroperitoneal lymphadenopathy concerning for metastasis. EXAM: CT BIOPSY COMPARISON:  09/08/2022 MEDICATIONS: None. ANESTHESIA/SEDATION: Fentanyl 50 mcg IV; Versed 1 mg IV Sedation time: 11 minutes; The patient was continuously monitored during the procedure by the interventional radiology nurse under my direct supervision. CONTRAST:  None. COMPLICATIONS: None immediate. PROCEDURE: RADIATION DOSE REDUCTION: This exam was performed according to the departmental dose-optimization program which includes automated exposure control, adjustment of the mA and/or kV according to patient size and/or use of iterative reconstruction technique. Informed consent was obtained from the patient following an explanation of the procedure, risks, benefits and alternatives. A time out was performed prior to the initiation of the procedure. The patient was positioned prone on the CT table and a limited CT was performed for procedural planning demonstrating similar appearing retroperitoneal lymphadenopathy. The procedure was planned. The operative site was prepped and draped in the usual sterile fashion. Appropriate trajectory was confirmed with a 22 gauge spinal needle after the adjacent tissues were anesthetized with 1% Lidocaine with epinephrine. Under intermittent CT guidance, a 17 gauge coaxial needle was advanced into the peripheral aspect of the infrarenal left retroperitoneal lymph node. Appropriate positioning was confirmed and a total of 3 samples were obtained with an 18  gauge core needle biopsy device. The co-axial needle was removed and hemostasis was achieved with manual compression. A limited postprocedural CT was negative for hemorrhage or additional complication. A dressing was placed. The patient tolerated the procedure well without immediate postprocedural complication. IMPRESSION: Technically successful CT guided core needle biopsy of enlarged infrarenal left retroperitoneal lymph node. Ruthann Cancer, MD [facility] Radiology Electronically Signed   By: Ruthann Cancer M.D.   On: 10/31/2022 12:02

## 2022-11-20 ENCOUNTER — Telehealth: Payer: Self-pay

## 2022-11-20 ENCOUNTER — Telehealth: Payer: Self-pay | Admitting: *Deleted

## 2022-11-20 DIAGNOSIS — R972 Elevated prostate specific antigen [PSA]: Secondary | ICD-10-CM

## 2022-11-20 NOTE — Telephone Encounter (Signed)
Received Disability Claim form, completed and sent for physician signature

## 2022-11-20 NOTE — Telephone Encounter (Signed)
-----   Message from Earlie Server, MD sent at 11/17/2022  7:16 PM EST ----- Please arrange him to follow up in 6 week, lab MD cbc cmp PSA

## 2022-11-20 NOTE — Telephone Encounter (Signed)
Please schedule and inform pt of appt:   Lab/MD in 6 weeks (cbc, cmp, PSA).

## 2022-11-21 ENCOUNTER — Telehealth: Payer: Self-pay

## 2022-11-21 NOTE — Telephone Encounter (Signed)
The PA previously done appears to be for [place] only, PA for Eligard submitted via [employer], awaiting official response.

## 2022-11-21 NOTE — Telephone Encounter (Signed)
Form Signed, completed faxed and mailed copy to patient

## 2022-11-21 NOTE — Telephone Encounter (Signed)
-----   Message from [place] sent at 11/15/2022 12:31 PM EST ----- Regarding: Eligard PA done Dates approved 09/26/22-09/27/23. PA number [id number]

## 2022-11-22 NOTE — Telephone Encounter (Signed)
Incoming approval of [place] from [place] for [phone].  Case # [id number]  Dates: 11/21/22 - 11/20/23  Pt previously scheduled.

## 2022-11-27 ENCOUNTER — Telehealth: Payer: Self-pay | Admitting: *Deleted

## 2022-11-27 NOTE — Telephone Encounter (Signed)
Received a second disabilty claim form for patient. Completed and sent for physician signature

## 2022-11-27 NOTE — Telephone Encounter (Signed)
Form signed and patient notified it is ready to pick up and will be available at the front desk

## 2022-11-28 ENCOUNTER — Inpatient Hospital Stay: Payer: 59 | Attending: Oncology | Admitting: Hospice and Palliative Medicine

## 2022-11-28 ENCOUNTER — Other Ambulatory Visit: Payer: Self-pay

## 2022-11-28 ENCOUNTER — Inpatient Hospital Stay: Payer: 59

## 2022-11-28 ENCOUNTER — Ambulatory Visit (INDEPENDENT_AMBULATORY_CARE_PROVIDER_SITE_OTHER): Payer: 59 | Admitting: Urology

## 2022-11-28 ENCOUNTER — Encounter: Payer: Self-pay | Admitting: Licensed Clinical Social Worker

## 2022-11-28 ENCOUNTER — Encounter: Payer: Self-pay | Admitting: Urology

## 2022-11-28 ENCOUNTER — Encounter: Payer: Self-pay | Admitting: Hospice and Palliative Medicine

## 2022-11-28 VITALS — BP 122/89 | HR 80 | Resp 20

## 2022-11-28 VITALS — BP 122/89 | HR 80 | Temp 96.7°F | Resp 20

## 2022-11-28 DIAGNOSIS — Z91148 Patient's other noncompliance with medication regimen for other reason: Secondary | ICD-10-CM

## 2022-11-28 DIAGNOSIS — R630 Anorexia: Secondary | ICD-10-CM

## 2022-11-28 DIAGNOSIS — C7951 Secondary malignant neoplasm of bone: Secondary | ICD-10-CM | POA: Insufficient documentation

## 2022-11-28 DIAGNOSIS — T839XXA Unspecified complication of genitourinary prosthetic device, implant and graft, initial encounter: Secondary | ICD-10-CM

## 2022-11-28 DIAGNOSIS — R531 Weakness: Secondary | ICD-10-CM | POA: Insufficient documentation

## 2022-11-28 DIAGNOSIS — Z139 Encounter for screening, unspecified: Secondary | ICD-10-CM

## 2022-11-28 DIAGNOSIS — R339 Retention of urine, unspecified: Secondary | ICD-10-CM

## 2022-11-28 DIAGNOSIS — C61 Malignant neoplasm of prostate: Secondary | ICD-10-CM | POA: Diagnosis present

## 2022-11-28 DIAGNOSIS — Z515 Encounter for palliative care: Secondary | ICD-10-CM | POA: Diagnosis not present

## 2022-11-28 DIAGNOSIS — R2681 Unsteadiness on feet: Secondary | ICD-10-CM

## 2022-11-28 DIAGNOSIS — N3289 Other specified disorders of bladder: Secondary | ICD-10-CM

## 2022-11-28 MED ORDER — GEMTESA 75 MG PO TABS: 75.0000 mg | ORAL_TABLET | Freq: Every day | ORAL | 0 refills | Status: DC

## 2022-11-28 MED ORDER — OXYCODONE HCL 5 MG PO TABS: 5.0000 mg | ORAL_TABLET | Freq: Four times a day (QID) | ORAL | 0 refills | Status: DC | PRN

## 2022-11-28 NOTE — Telephone Encounter (Addendum)
PA approved for 11/14/22-11/17/23-RX sent to [facility].

## 2022-11-28 NOTE — Progress Notes (Signed)
11/28/2022 7:10 PM   Roger [name] 02-28-1956 [id number]  Referring provider: Lahoma Rocker, MD [address] [place],  [place] [address]  Urological history: 1. Advanced metastatic prostate cancer -PSA > 1500  -CT abdominal mass today  2. 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 male who presents today for issues with his catheter.   We were contacted by Eric Form, RN at the cancer center that he had mentioned that his catheter is leaking from multiple areas.  He was by himself at today's visit.  He stated that the catheter had been leaking, but I could not get a clear indication as to whether the leakage was due to bladder spasms and was happening around the penis or if the leaking was mechanical in nature.  It was noticed that the Foley was not attached securely to the night bag and the pants were wet in the same area.  There was no urine in the night bag and the tubing was hazy which may indicate some sediment issues.    PMH: Past Medical History:  Diagnosis Date   Chest pain    a. 04/2016 MV: small, mild apical defect c/w apical ischemia, EF 55-65%-->Low risk study.   Diabetes mellitus without complication ([place])    Gout    Hematochezia    History of echocardiogram    a. 04/2016 Echo: EF 55-60%, mild LVH, no rwma, midly dil LA.   History of heart attack    Hypertension    Morbid obesity ([place])     Surgical History: Past Surgical History:  Procedure Laterality Date   COLONOSCOPY WITH PROPOFOL N/A 08/05/2018   Procedure: COLONOSCOPY WITH PROPOFOL;  Surgeon: Lin Landsman, MD;  Location[facility] ENDOSCOPY;  Service: Gastroenterology;  Laterality: N/A;   thumb surgery      Home Medications:  Allergies as of 11/28/2022       Reactions   Albuterol Nausea And Vomiting   Mucinex [guaifenesin Er] Rash        Medication List         Accurate as of November 28, 2022 11:59 PM. If you have any questions, ask your nurse or doctor.          STOP taking these medications    cyanocobalamin 1000 MCG tablet Commonly known as: VITAMIN B12 Stopped by: Cleda Imel, PA-C   leuprolide acetate (6 Month) 45 MG injection Generic drug: leuprolide (6 Month) Stopped by: Ankur Snowdon, PA-C   oxyCODONE-acetaminophen 5-325 MG tablet Commonly known as: PERCOCET/ROXICET Stopped by: Irean Hong, NP   polyethylene glycol 17 g packet Commonly known as: MIRALAX / GLYCOLAX Stopped by: Durene Dodge, PA-C       TAKE these medications    allopurinol 100 MG tablet Commonly known as: ZYLOPRIM Take 2 tablets (200 mg total) by mouth 2 (two) times daily.   apalutamide 240 MG tablet Commonly known as: ERLEADA Take 1 tablet (240 mg total) by mouth daily.   aspirin EC 81 MG tablet Take 1 tablet (81 mg total) by mouth daily.   folic acid 1 MG tablet Commonly known as: FOLVITE Take 1 tablet (1 mg total) by mouth daily.   Gemtesa 75 MG Tabs Generic drug: Vibegron Take 75 mg by mouth daily. Started by: Zara Council, PA-C   isosorbide mononitrate 30 MG 24 hr tablet Commonly  known as: IMDUR TAKE 1 TABLET(30 MG) BY MOUTH DAILY   metFORMIN 500 MG tablet Commonly known as: GLUCOPHAGE Take 1 tablet (500 mg total) by mouth 2 (two) times daily with a meal.   metoprolol tartrate 25 MG tablet Commonly known as: LOPRESSOR Take 1 tablet (25 mg total) by mouth 2 (two) times daily.   oxyCODONE 5 MG immediate release tablet Commonly known as: Roxicodone Take 1 tablet (5 mg total) by mouth every 6 (six) hours as needed for severe pain.   pantoprazole 40 MG tablet Commonly known as: PROTONIX Take 1 tablet (40 mg total) by mouth daily before breakfast.   predniSONE 5 MG tablet Commonly known as: DELTASONE Take 5 mg by mouth daily with breakfast.   rosuvastatin 10 MG tablet Commonly known as: Crestor Take 1  tablet (10 mg total) by mouth daily.   triamterene-hydrochlorothiazide 37.5-25 MG tablet Commonly known as: MAXZIDE-25 Take 1 tablet by mouth daily.   Uloric 40 MG tablet Generic drug: febuxostat Take 40 mg by mouth daily.        Allergies:  Allergies  Allergen Reactions   Albuterol Nausea And Vomiting   Mucinex [Guaifenesin Er] Rash    Family History: Family History  Problem Relation Age of Onset   Heart Problems Mother    Breast cancer Mother    Mental illness Father    Heart disease Father    Stroke Father    Diabetes Father    Alcohol abuse Father    Bipolar disorder Father    Heart disease Other    Hypertension Other    Diabetes Other     Social History:  reports that he quit smoking about 30 years ago. His smoking use included cigarettes. He has a 6.25 pack-year smoking history. He has been exposed to tobacco smoke. He has never used smokeless tobacco. He reports current alcohol use of about 42.0 standard drinks of alcohol per week. He reports that he does not use drugs.  ROS: Pertinent ROS in HPI  Physical Exam: Constitutional:  Well nourished. Alert and oriented, No acute distress. HEENT: [place] AT, moist mucus membranes.  Trachea midline Cardiovascular: No clubbing, cyanosis, or edema. Respiratory: Normal respiratory effort, no increased work of breathing. GU: No CVA tenderness.  No bladder fullness or masses.  Patient with uncircumcised phallus. Foreskin easily retracted  Foley in place.  No penile discharge. No penile lesions or rashes. Scrotum without lesions, cysts, rashes and/or edema.   Neurologic: Grossly intact, no focal deficits, moving all 4 extremities.  He is unable to stand without assitance Psychiatric: Normal mood and affect.   Laboratory Data: Lab Results  Component Value Date   WBC 13.4 (H) 11/15/2022   HGB 9.0 (L) 11/15/2022   HCT 27.6 (L) 11/15/2022   MCV 95.5 11/15/2022   PLT 293 11/15/2022    Lab Results  Component Value Date    CREATININE 1.00 11/15/2022    Lab Results  Component Value Date   HGBA1C 6.5 (H) 09/27/2022    Lab Results  Component Value Date   AST 19 11/15/2022   Lab Results  Component Value Date   ALT 8 11/15/2022  I have reviewed the labs.   Pertinent Imaging N/A   Cath Change/ Replacement Patient is present today for a catheter change due to urinary retention.  8 ml of water was removed from the balloon, a 16 FR Coude foley cath was removed without difficulty.  There was some mucus type material, but it did not appear  that the Foley was clogged.  Patient was cleaned and prepped in a sterile fashion with betadine and 2% lidocaine jelly was instilled into the urethra. A 16 FR Coude foley cath was replaced into the bladder, no complications were noted. Foley irrigated easily.  The balloon was filled with 59m of sterile water. A night bag was attached for drainage.  Patient was given proper instruction on catheter care.    Performed by: SZara Council PA-C and DSpurgeon CMA and CElberta Leatherwood CMA   Assessment & Plan:    1. Advanced metastatic prostate cancer -continue Erleada  -patient will be due for an Eligard injection next week -He has many barriers to healthcare such as financial constraints, limited social support, no reliable transportation, ADL limitations and poor medical insight  2. Urinary retention -managed with a Foley catheter for the time being -may consider a voiding trial in the future -return in one month for catheter exchange  Return for Return on the 10th for Eligard injection and PSA .  These notes generated with voice recognition software. I apologize for typographical errors.  SRoyden Purl [address] [place] [phone]  I spent 30 minutes on the day of the encounter to include pre-visit record review, face-to-face time with the patient, and post-visit  ordering of tests.

## 2022-11-28 NOTE — Addendum Note (Signed)
Addended by: Altha Harm R on: 11/28/2022 01:40 PM   Modules accepted: Orders

## 2022-11-28 NOTE — Addendum Note (Signed)
Addended by: Altha Harm R on: 11/28/2022 12:22 PM   Modules accepted: Orders

## 2022-11-28 NOTE — Progress Notes (Signed)
[place] Work  Clinical Social Work was referred by medical provider for assessment of psychosocial needs.  Clinical Social Worker contacted patient by phone  to offer support and assess for needs.  Patient unavailable to talk.  CSW will contact later this week.   FA  Adelene Amas, LCSW  Clinical Social Worker [facility]

## 2022-11-28 NOTE — Progress Notes (Signed)
Nutrition Assessment   Reason for Assessment:  New metastatic prostate cancer, poor appetite   ASSESSMENT:   67 year old male with metastatic prostate cancer to bone.  Past medical history of Etoh use, gout, DM, heart attack, HTN.  Noted recent hospitalization with discharge to SNF.  Patient discharged from SNF on 11/01/22 to home.    Met with patient in clinic.  Patient reports that he has no appetite.  Patient reports that he has trouble getting to the bathroom to have a bowel movement, wears pampers.  Says that his wheelchair is too wide to get into his bathroom.  Also says that he does not want to eat much because he does not want to have a bowel movement.  Daughter and grandsons moved in with him but sounds like they are not with him 24/7. When asked if patient has access to food he said "sometimes."      Medications: patient unsure    Labs: reviewed from 12/21   Anthropometrics:   Height: 70 inches Weight: 168 lb from 12/21 214 lb on 12/6 BMI: 24  21% weight loss in the last 3 weeks, ?? accuracy  NUTRITION DIAGNOSIS: Inadequate oral intake related to cancer, deconditioning as evidenced by 21% weight loss in the last 3 weeks, poor po intake   INTERVENTION:  Provided food bag and complimentary case of ensure plus to patient today Discussed easy to prepare foods (frozen meals, canned soups, canned meats, sandwiches, nabs, etc) Discussed with RN and NP need for more narrow wheelchair to get into bathroom. RN was able to order one for patient Encouraged patient to "nibble" q 2 hours Patient may benefit from trial of appetite stimulant.      MONITORING, EVALUATION, GOAL: weight trends, intake   Next Visit: Wednesday, Jan 23 phone call  Roger [name] B. Zenia Resides, [place], [place] Registered Dietitian [phone]

## 2022-11-28 NOTE — Progress Notes (Signed)
Patient's family will be moving out in the weeks ahead. RN voiced concern given pt is not able to perform many basic adls- that patient may need more support in the home.

## 2022-11-28 NOTE — Progress Notes (Signed)
Patient here for palliative care apt- referral by oncology team. Pt arrives in w/c today with complaints of weakness and his foley cath not functioning. States that the foley cath is leaking in ' multiple areas." Has a poor appetite.   Pt is a poor historian for his medical care and unable to recall what meds he is actually taking. He states that he only takes his oycodone. But can not tell me if he is taking the percocet or just the oxycodone by itself. He states that he was recently d/c from SNF/assisted living and now living in his home. He stated that he has no routine medications that he is currently taking b/c he doesn't have any refills.' He stated that he is taking prednisone and believes that it's 5 mg daily but is not completely sure. He takes the prednisone for his gout.   Pt rides cancer [name] transportation as his main way to get to the apts. He has no one to drive him to apts and request [name] transportation.   Per [name] requires w/c smaller size. Pt's current w/c is not able to fit in his bathroom. He chooses not to eat at times and or uses pull-ups/pampers b/c he does not want to have to go to the bathroom.  Does not have readily access to food. He continues to loose weight.  Patient's family is getting ready to move out of the home. He is currently having some of his adult grand kids staying with him

## 2022-11-28 NOTE — Progress Notes (Addendum)
Roger [name] at [facility] [phone] [phone]   Name: Roger [name] Date: 11/28/2022 MRN: [id number]  DOB: Mar 28, 1956  Patient Care Team: Lahoma Rocker, MD as PCP - General (Rheumatology)    REASON FOR CONSULTATION: Roger [name] is a 67 y.o. 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  Patient was referred to palliative care to address goals and manage ongoing symptoms.  SOCIAL HISTORY:     reports that he quit smoking about 30 years ago. His smoking use included cigarettes. He has a 6.25 pack-year smoking history. He has never used smokeless tobacco. He reports current alcohol use of about 42.0 standard drinks of alcohol per week. He reports that he does not use drugs.  Patient is unmarried.  He has a long-term girlfriend.  He lives at home with his daughter and grandchildren but they are in process of moving out of the house.  In total, patient has two daughters and three sons.  Patient previously worked at [employer] in [employer].   ADVANCE DIRECTIVES:  Does not have  CODE STATUS:   PAST MEDICAL HISTORY: Past Medical History:  Diagnosis Date   Chest pain    a. 04/2016 MV: small, mild apical defect c/w apical ischemia, EF 55-65%-->Low risk study.   Diabetes mellitus without complication ([place])    Gout    Hematochezia    History of echocardiogram    a. 04/2016 Echo: EF 55-60%, mild LVH, no rwma, midly dil LA.   History of heart attack    Hypertension    Morbid obesity ([place])     PAST SURGICAL HISTORY:  Past Surgical History:  Procedure Laterality Date   COLONOSCOPY WITH PROPOFOL N/A 08/05/2018   Procedure: COLONOSCOPY WITH PROPOFOL;  Surgeon: Lin Landsman, MD;  Location: [facility];  Service: Gastroenterology;  Laterality: N/A;   thumb surgery       HEMATOLOGY/ONCOLOGY HISTORY:  Oncology History  Prostate cancer metastatic to bone ([place])  09/08/2022 Imaging   CT abdomen pelvis w contrast showed  1. Mild bilateral hydronephrosis and distended urinary bladder, consistent with bladder outlet obstruction and hydronephrosis secondary to back pressure. No urinary tract calculi. 2. Prostatomegaly with indistinct margins, particularly at the prostatic base. 3. Numerous enlarged bilateral pelvic sidewall, iliac, and retroperitoneal lymph nodes. 4. 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 5. Constellation of findings is consistent prostate malignancy and associated metastatic disease. 6. Hepatic steatosis. 7. Cholelithiasis without evidence of acute cholecystitis. 8. Pancolonic diverticulosis without evidence of acute diverticulitis.Aortic Atherosclerosis   09/08/2022 Tumor Marker   PSA 1500   09/27/2022 Initial Diagnosis   Prostate cancer metastatic to bone  09/08/22 Patient recently presented to ER for evaluation of urinary difficulty, gout flare/foot swelling.  Lower extremity US in ER negative for blood clot. 09/08/22 CT scan showed hydronephrosis, prostatomegaly, severe pelvic lymphadenopathy, widespread sclerotic osseous lesions. A Foley catheter was placed and patient followed up with urology Dr.Brandon on 09/20/22, PSMA PET was ordered, and planned to get biopsy for tissue diagnosis.  09/24/22 established care with oncology  His work up for prostate cancer was delayed due to hospitalization due to severe sepsis and later again due to chest pain work up in ED.   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  Blood cultures grewout E. coli.  Urine culture also  positive for E. Coli, Klebsiella oxytoca, Enterococcus faecalis.  He was treated with antibiotics Rocephin>changed to Unasyn.  Planned lymphadenopathy biopsy was delayed due to severe sepsis.  He was also transfused with PRBC 1 unit due to acute drop of hemoglobin.     10/17/2022 Imaging   CT angio chest pulmonary embolism protocol 1. No evidence acute pulmonary embolism. 2. Widespread sclerotic skeletal metastasis similar to CT 2014. 3. Lymph node along the descending thoracic aorta could represent a metastatic prostate cancer lymph node. 4. Lobular soft tissue on either side of the first RIGHT rib and sternal junction associated with erosion of the joint. Favor inflammatory arthropathy rather than a metastatic lesion. Patient's history of gout which may contribute to this finding.   10/31/2022 Cancer Staging   Staging form: Prostate, AJCC 8th Edition - Clinical stage from 10/31/2022: Stage IVB (cN1, cM1b, PSA: 1500) - Signed by Earlie Server, MD on 11/17/2022 Stage prefix: Initial diagnosis Prostate specific antigen (PSA) range: 20 or greater   10/31/2022 Miscellaneous   Started on loading dose of Firmagon at urologist's office     ALLERGIES:  is allergic to albuterol and mucinex [guaifenesin er].  MEDICATIONS:  Current Outpatient Medications  Medication Sig Dispense Refill   allopurinol (ZYLOPRIM) 100 MG tablet Take 2 tablets (200 mg total) by mouth 2 (two) times daily. 120 tablet 0   aspirin EC 81 MG tablet Take 1 tablet (81 mg total) by mouth daily. 30 tablet 0   oxyCODONE (ROXICODONE) 5 MG immediate release tablet Take 1 tablet (5 mg total) by mouth every 6 (six) hours as needed for severe pain. 12 tablet 0   predniSONE (DELTASONE) 5 MG tablet Take 5 mg by mouth daily with breakfast.     apalutamide (ERLEADA) 240 MG tablet Take 1 tablet (240 mg total) by mouth daily. 90 tablet 0   cyanocobalamin (VITAMIN B12) 1000 MCG tablet Take 1 tablet (1,000 mcg total) by mouth daily. (Patient not  taking: Reported on 11/28/2022) 90 tablet 0   febuxostat (ULORIC) 40 MG tablet Take 40 mg by mouth daily.     folic acid (FOLVITE) 1 MG tablet Take 1 tablet (1 mg total) by mouth daily. 30 tablet 2   isosorbide mononitrate (IMDUR) 30 MG 24 hr tablet TAKE 1 TABLET(30 MG) BY MOUTH DAILY 30 tablet 0   leuprolide, 6 Month, (LEUPROLIDE ACETATE, 6 MONTH,) 45 MG injection Inject 45 mg into the skin every 6 (six) months. (Patient not taking: Reported on 11/15/2022) 1 each 1   metFORMIN (GLUCOPHAGE) 500 MG tablet Take 1 tablet (500 mg total) by mouth 2 (two) times daily with a meal. 60 tablet 0   metoprolol tartrate (LOPRESSOR) 25 MG tablet Take 1 tablet (25 mg total) by mouth 2 (two) times daily. 30 tablet 0   oxyCODONE-acetaminophen (PERCOCET/ROXICET) 5-325 MG tablet Take 1 tablet by mouth every 4 (four) hours as needed for moderate pain or severe pain. 20 tablet 0   pantoprazole (PROTONIX) 40 MG tablet Take 1 tablet (40 mg total) by mouth daily before breakfast.     polyethylene glycol (MIRALAX / GLYCOLAX) 17 g packet Take 17 g by mouth daily as needed for mild constipation or moderate constipation. (Patient not taking: Reported on 11/15/2022) 14 each 0   rosuvastatin (CRESTOR) 10 MG tablet Take 1 tablet (10 mg total) by mouth  daily. 30 tablet 0   triamterene-hydrochlorothiazide (MAXZIDE-25) 37.5-25 MG tablet Take 1 tablet by mouth daily. 30 tablet 0   No current facility-administered medications for this visit.    VITAL SIGNS: BP 122/89   Pulse 80   Temp (!) 96.7 F (35.9 C) (Tympanic)   Resp 20  There were no vitals filed for this visit.  Estimated body mass index is 24.11 kg/m as calculated from the following:   Height as of 10/31/22: '5\' 10"'$  (1.778 m).   Weight as of 11/15/22: 168 lb (76.2 kg).  LABS: CBC:    Component Value Date/Time   WBC 13.4 (H) 11/15/2022 1322   HGB 9.0 (L) 11/15/2022 1322   HGB 11.5 (L) 09/23/2013 1256   HCT 27.6 (L) 11/15/2022 1322   HCT 33.6 (L) 09/23/2013 1256    PLT 293 11/15/2022 1322   PLT 354 09/23/2013 1256   MCV 95.5 11/15/2022 1322   MCV 91 09/23/2013 1256   NEUTROABS 11.0 (H) 11/15/2022 1322   NEUTROABS 5.9 09/23/2013 1256   LYMPHSABS 1.7 11/15/2022 1322   LYMPHSABS 2.1 09/23/2013 1256   MONOABS 0.5 11/15/2022 1322   MONOABS 0.5 09/23/2013 1256   EOSABS 0.1 11/15/2022 1322   EOSABS 0.3 09/23/2013 1256   BASOSABS 0.1 11/15/2022 1322   BASOSABS 0.1 09/23/2013 1256   Comprehensive Metabolic Panel:    Component Value Date/Time   NA 131 (L) 11/15/2022 1322   NA 137 09/23/2013 1256   K 3.9 11/15/2022 1322   K 3.9 09/23/2013 1256   CL 94 (L) 11/15/2022 1322   CL 106 09/23/2013 1256   CO2 24 11/15/2022 1322   CO2 28 09/23/2013 1256   BUN 16 11/15/2022 1322   BUN 27 (H) 09/23/2013 1256   CREATININE 1.00 11/15/2022 1322   CREATININE 2.34 (H) 07/17/2019 1133   GLUCOSE 165 (H) 11/15/2022 1322   GLUCOSE 116 (H) 09/23/2013 1256   CALCIUM 8.6 (L) 11/15/2022 1322   CALCIUM 9.5 09/23/2013 1256   AST 19 11/15/2022 1322   AST 27 09/23/2013 1256   ALT 8 11/15/2022 1322   ALT 23 09/23/2013 1256   ALKPHOS 650 (H) 11/15/2022 1322   ALKPHOS 117 09/23/2013 1256   BILITOT 0.8 11/15/2022 1322   BILITOT 0.3 09/23/2013 1256   PROT 7.6 11/15/2022 1322   PROT 8.1 09/23/2013 1256   ALBUMIN 3.1 (L) 11/15/2022 1322   ALBUMIN 3.4 09/23/2013 1256    RADIOGRAPHIC STUDIES: CT ABDOMINAL MASS BIOPSY  Result Date: 10/31/2022 INDICATION: 67 year old male with history of prostate cancer and multifocal retroperitoneal lymphadenopathy concerning for metastasis. EXAM: CT BIOPSY COMPARISON:  09/08/2022 MEDICATIONS: None. ANESTHESIA/SEDATION: Fentanyl 50 mcg IV; Versed 1 mg IV Sedation time: 11 minutes; The patient was continuously monitored during the procedure by the interventional radiology nurse under my direct supervision. CONTRAST:  None. COMPLICATIONS: None immediate. PROCEDURE: RADIATION DOSE REDUCTION: This exam was performed according to the  departmental dose-optimization program which includes automated exposure control, adjustment of the mA and/or kV according to patient size and/or use of iterative reconstruction technique. Informed consent was obtained from the patient following an explanation of the procedure, risks, benefits and alternatives. A time out was performed prior to the initiation of the procedure. The patient was positioned prone on the CT table and a limited CT was performed for procedural planning demonstrating similar appearing retroperitoneal lymphadenopathy. The procedure was planned. The operative site was prepped and draped in the usual sterile fashion. Appropriate trajectory was confirmed with a 22 gauge spinal needle after  the adjacent tissues were anesthetized with 1% Lidocaine with epinephrine. Under intermittent CT guidance, a 17 gauge coaxial needle was advanced into the peripheral aspect of the infrarenal left retroperitoneal lymph node. Appropriate positioning was confirmed and a total of 3 samples were obtained with an 18 gauge core needle biopsy device. The co-axial needle was removed and hemostasis was achieved with manual compression. A limited postprocedural CT was negative for hemorrhage or additional complication. A dressing was placed. The patient tolerated the procedure well without immediate postprocedural complication. IMPRESSION: Technically successful CT guided core needle biopsy of enlarged infrarenal left retroperitoneal lymph node. Ruthann Cancer, MD [facility] Electronically Signed   By: Ruthann Cancer M.D.   On: 10/31/2022 12:02    PERFORMANCE STATUS (ECOG) : 3 - Symptomatic, >50% confined to bed  Review of Systems Unless otherwise noted, a complete review of systems is negative.  Physical Exam General: NAD Cardiovascular: regular rate and rhythm Pulmonary: clear ant fields Abdomen: soft, nontender, + bowel sounds GU: no suprapubic  tenderness Extremities: no edema, no joint deformities Skin: no rashes Neurological: Weakness but otherwise nonfocal  IMPRESSION: Had a lengthy visit today with patient.  His significant other, Dewaine Oats, participated in the visit via phone.  Patient has been home from SNF about 3 weeks and is doing rather poorly.  He has limited social support.  Despite his daughter and grandchildren also living in the same house, patient states that family is gone most of the day and he is home alone.  Patient's daughter works at night and is often gone several days at a time.  He says that daughter is in process of moving out of the house.    Patient says that he is having extreme difficulty caring for himself.  He wears depends and has an indwelling Foley.  The Foley is reportedly leaking.  He soils himself and has difficulty with maintaining hygiene.  He is able to stand but is unsteady on his feet and is unable to ambulate without assistance.  He is in a wheelchair at baseline.  Of note, patient was working in [employer] several months ago but has been severely debilitated since his recent hospitalization.  He had little improvement with physical therapy at SNF.  Patient admits that he is not taking prescribed medications as he both does not know what he is supposed to take and says that he does not think that anything was prescribed after he left the SNF.  He does not have any of his maintenance medications in the home. He says that he takes oxycodone for pain once every few days but only has several of those tablets left.   Patient previously tried to establish with a PCP in [place] whom he saw once but says that they are no longer in network with his insurance.   Patient says that he has applied for short-term disability.  I would highly recommend that he also apply for Medicaid as he would benefit from assistance through [facility] and may require higher level of placement if family are unable to assist with  his care.   Patient says that he recognizes that he has prostate cancer but has limited insight into the specifics or plan for treatment.  His significant other was able to provide those details. Patient is hoping to proceed with cancer treatment but did ask about future "comfort care."  I also recommended that patient complete ACP documents and we will see if our SW can  assist with this.  Patient says that he would want his sister to be his healthcare proxy.  PLAN: Patient needs substantial social support/healthcare resources. Will send referrals to home health (PT/OT/SW/RN/CNA) in addition to internal referrals for SW, nutrition, and OT.  We reached out to urology to see if they would be able to see him today due to his leaking Foley as transportation is also a significant barrier. Will have RN call pharmacy to inquire if patient has any prescriptions available for pickup.  If not, would consider refilling maintenance medications until patient can establish with a new PCP. Will refill his oxycodone. PDMP reviewed.       Durable Medical Equipment  (From admission, onward)           Start     Ordered   11/28/22 1340  For home use only DME lightweight manual wheelchair with seat cushion  Once       Comments: Patient suffers from cancer, weakness which impairs their ability to perform daily activities like feeding, grooming, and toileting in the home.  A cane, crutch, or walker will not resolve  issue with performing activities of daily living. A wheelchair will allow patient to safely perform daily activities. Patient is not able to propel themselves in the home using a standard weight wheelchair due to general weakness. Patient can self propel in the lightweight wheelchair. Length of need Lifetime. Accessories: elevating leg rests (ELRs), wheel locks, extensions and anti-tippers.   11/28/22 1339             Patient expressed understanding and was in agreement with this plan. He also  understands that He can call the clinic at any time with any questions, concerns, or complaints.     Time Total: 45 minutes  Visit consisted of counseling and education dealing with the complex and emotionally intense issues of symptom management and palliative care in the setting of serious and potentially life-threatening illness.Greater than 50%  of this time was spent counseling and coordinating care related to the above assessment and plan.  Signed by: Altha Harm, PhD, NP-C

## 2022-11-29 ENCOUNTER — Other Ambulatory Visit: Payer: Self-pay | Admitting: *Deleted

## 2022-11-29 ENCOUNTER — Telehealth: Payer: Self-pay | Admitting: *Deleted

## 2022-11-29 DIAGNOSIS — R2681 Unsteadiness on feet: Secondary | ICD-10-CM

## 2022-11-29 DIAGNOSIS — Z758 Other problems related to medical facilities and other health care: Secondary | ICD-10-CM

## 2022-11-29 DIAGNOSIS — C61 Malignant neoplasm of prostate: Secondary | ICD-10-CM

## 2022-11-29 NOTE — Telephone Encounter (Signed)
Received incoming msg from [place] at [facility]  "We are unable to see Mr. Angert for [facility] services due to his insurance. I have tried other agencies as well."

## 2022-11-30 ENCOUNTER — Inpatient Hospital Stay: Payer: 59 | Admitting: Licensed Clinical Social Worker

## 2022-11-30 DIAGNOSIS — C61 Malignant neoplasm of prostate: Secondary | ICD-10-CM

## 2022-11-30 NOTE — Progress Notes (Signed)
[place] Work  Initial Assessment   Roger [name] is a 67 y.o. year old male contacted by phone. Clinical Social Work was referred by medical provider for assessment of psychosocial needs.   SDOH (Social Determinants of Health) assessments performed: Yes SDOH Interventions    Flowsheet Row Clinical Support from 11/30/2022 in [place] at [place] Most recent reading at 11/30/2022 12:02 PM Office Visit from 11/28/2022 in [place] at [place] Most recent reading at 11/28/2022  5:02 PM Appointment from 11/28/2022 in [place] at [place] Most recent reading at 11/28/2022  9:51 AM Appointment from 11/15/2022 in [place] at [place] Most recent reading at 11/15/2022  1:32 PM  SDOH Interventions      Food Insecurity Interventions -- Other (Comment)  [will provide access to food pantry] -- --  Housing Interventions -- Intervention Not Indicated -- --  Transportation Interventions -- CCAR Van ([place]. Only) CCAR [name] ([facility] Only) CCAR [name] ([facility] Only)  Utilities Interventions -- Intervention Not Indicated -- --  Financial Strain Interventions -- Other (Comment)  [cancer ctr pantry provided to help with food insecurity] -- --  Physical Activity Interventions -- Other (Comments)  [referral to cancer center OT screen] -- --  Stress Interventions Provide Counseling -- -- --  Social Connections Interventions -- Other (Comment)  Manufacturing engineer consult] -- --       SDOH Screenings   Food Insecurity: Food Insecurity Present (11/28/2022)  Housing: Low Risk  (11/28/2022)  Transportation Needs: Unmet Transportation Needs (11/28/2022)  Utilities: Not At Risk (11/28/2022)  Depression (PHQ2-9): Low Risk  (11/28/2022)  Financial Resource Strain: High Risk (11/28/2022)  Physical Activity: Inactive (11/28/2022)  Social Connections: Socially Isolated (11/30/2022)  Stress: Stress  Concern Present (11/30/2022)  Tobacco Use: Medium Risk (11/28/2022)     Distress Screen completed: No    09/24/2022    1:17 PM  ONCBCN DISTRESS SCREENING  Screening Type Initial Screening  Distress experienced in past week (1-10) 0      Family/Social Information:  Housing Arrangement: patient lives with daughter and grandchildren, they are moving out in then next month.   Patient's main contact ahmed inniss, son, [phone]  Family members/support persons in your life? Family and Medical Staff Transportation concerns: yes, patient is currently using [place] transportation services.  Employment: Out on work excuse , currently on short term-disability.  Income source: Short-Term Disability Financial concerns: Yes, due to illness and/or loss of work during treatment Type of concern: Utilities, Rent/ mortgage, Transportation, Medical bills, and Food Food access concerns: no immediate concerns, but patient is worries about future financial strain due to loss of income. Religious or spiritual practice: No Services Currently in place:  [employer]  Coping/ Adjustment to diagnosis: Patient understands treatment plan and what happens next? yes Concerns about diagnosis and/or treatment: Pain or discomfort during procedures, Feelings of anger or sadness, Losing my job and/or losing income, Overwhelmed by information, Afraid of cancer, How I will pay for the services I need, How will I care for myself, and Quality of life Patient reported stressors: Housing, Work/ school, Publishing rights manager, Transport planner, Haematologist, Adjusting to my illness, Isolation/ feeling alone, and Physical issues Hopes and/or priorities: To qualify for long term disability and Medicaid Patient enjoys  N/A Current coping skills/ strengths: Average or above average intelligence , Capable of independent living , Motivation for treatment/growth , and Supportive family/friends     SUMMARY: Current SDOH Barriers:  Financial  constraints related to  fixed income, currently on short-term disability, Limited social support, Transportation, Level of care concerns, ADL IADL limitations, and Social Isolation  Clinical Social Work Clinical Goal(s):  Patient will follow up with [employer] to inquire about Medicaid* as directed by SW  Interventions: Discussed common feeling and emotions when being diagnosed with cancer, and the importance of support during treatment Informed patient of the support team roles and support services at [facility] Provided [place] contact information and encouraged patient to call with any questions or concerns Referred patient to [employer], [employer] and Provided patient with information about CSW role in patient care and other available resources.   Follow Up Plan: Patient will contact CSW with any support or resource needs Patient verbalizes understanding of plan: Yes    Nakiyah Beverley, LCSW

## 2022-11-30 NOTE — Telephone Encounter (Signed)
Spoke to Tree surgeon at [place], RX for [facility] is in the system and they will contact patient to get delivery set up and payment if needed.

## 2022-12-03 ENCOUNTER — Ambulatory Visit: Payer: 59 | Admitting: Physician Assistant

## 2022-12-05 ENCOUNTER — Ambulatory Visit: Payer: 59 | Admitting: Urology

## 2022-12-05 ENCOUNTER — Telehealth: Payer: Self-pay | Admitting: *Deleted

## 2022-12-05 ENCOUNTER — Inpatient Hospital Stay: Payer: 59

## 2022-12-05 ENCOUNTER — Inpatient Hospital Stay: Payer: 59 | Admitting: Occupational Therapy

## 2022-12-05 DIAGNOSIS — R531 Weakness: Secondary | ICD-10-CM

## 2022-12-05 DIAGNOSIS — C61 Malignant neoplasm of prostate: Secondary | ICD-10-CM | POA: Diagnosis not present

## 2022-12-05 MED ORDER — LEUPROLIDE ACETATE (6 MONTH) 45 MG ~~LOC~~ KIT
45.0000 mg | PACK | Freq: Once | SUBCUTANEOUS | Status: AC
  Administered 2022-12-05: 45 mg via SUBCUTANEOUS

## 2022-12-05 NOTE — Telephone Encounter (Signed)
Received new update request from [facility] for patient. Form completed and sent for physician signature

## 2022-12-05 NOTE — Telephone Encounter (Signed)
Per Dr. Tasia Catchings, pt will be followed by Dr. Erlene Quan for prostate cancer management and Eligard injection, so he will need to take these forms to Dr. Cherrie Gauze office for them to fill out.   Pt's appts in Feb will need to be cancelled and Dr. Erlene Quan will refer back to [place] once treatment with injections is no longer effective.

## 2022-12-05 NOTE — Progress Notes (Addendum)
Patient ID: Roger [name], male   DOB: 1956-05-28, 67 y.o.   MRN: [id number] Eligard SubQ Injection   Due to Prostate Cancer patient is present today for a Eligard Injection.  Medication: Eligard 6 month Dose: 45 mg  Location: left  Lot: [id number] Exp: [id number]  Patient tolerated well, no complications were noted  Performed by: Edwin Dada, [place]  Per Dr. Tasia Catchings patient is to continue therapy for every 6 months . Patient's next follow up was scheduled for 06/05/23. This appointment was scheduled using wheel and given to patient today along with reminder continue on Vitamin D 800-1000iu and Calcium 1000-'1200mg'$  daily while on Androgen Deprivation Therapy.  PA approval dates:

## 2022-12-05 NOTE — Telephone Encounter (Signed)
Dr Tasia Catchings has declined to sign this form stating patient will no longer be followed here, Dr Erlene Quan will be taking care of his needs. Call to patient and explained to him that he will need to contact [facility] for a new form and have Dr Erlene Quan fill it out

## 2022-12-05 NOTE — Therapy (Signed)
[facility] at [facility], [place], [place], [address] Phone: [phone]   Fax:  [phone]  Occupational Therapy Screen  Patient Details  Name: Roger [name] MRN: [id number] Date of Birth: 1956-10-18 No data recorded  Encounter Date: 12/05/2022   OT End of Session - 12/05/22 1431     Visit Number 0             Past Medical History:  Diagnosis Date   Chest pain    a. 04/2016 MV: small, mild apical defect c/w apical ischemia, EF 55-65%-->Low risk study.   Diabetes mellitus without complication ([place])    Gout    Hematochezia    History of echocardiogram    a. 04/2016 Echo: EF 55-60%, mild LVH, no rwma, midly dil LA.   History of heart attack    Hypertension    Morbid obesity ([place])     Past Surgical History:  Procedure Laterality Date   COLONOSCOPY WITH PROPOFOL N/A 08/05/2018   Procedure: COLONOSCOPY WITH PROPOFOL;  Surgeon: Lin Landsman, MD;  Location: [facility] ENDOSCOPY;  Service: Gastroenterology;  Laterality: N/A;   thumb surgery      There were no vitals filed for this visit.   Subjective Assessment - 12/05/22 1019     Subjective  Doing okay - better than last week. Eating good. They are building me a ramp at the moment. Have 3 step to get in mobile home             NOTE 11/28/22 [name] NP: Patient here for palliative care apt- referral by oncology team. Pt arrives in w/c today with complaints of weakness and his foley cath not functioning. States that the foley cath is leaking in ' multiple areas." Has a poor appetite.    Pt is a poor historian for his medical care and unable to recall what meds he is actually taking. He states that he only takes his oycodone. But can not tell me if he is taking the percocet or just the oxycodone by itself. He states that he was recently d/c from SNF/assisted living and now living in his home. He stated that he has no routine medications that he is  currently taking b/c he doesn't have any refills.' He stated that he is taking prednisone and believes that it's 5 mg daily but is not completely sure. He takes the prednisone for his gout.     Pt rides cancer center [name] transportation as his main way to get to the apts. He has no one to drive him to apts and request [name] transportation.     Per [name] requires w/c smaller size. Pt's current w/c is not able to fit in his bathroom. He chooses not to eat at times and or uses pull-ups/pampers b/c he does not want to have to go to the bathroom.  Does not have readily access to food. He continues to loose weight.   Patient's family is getting ready to move out of the home. He is currently having some of his adult grand kids staying with him   OT SCREEN: Patient presented OT screen today referred by palliative care. Patient's insurance do not cover home health.  Patient was in the hospital last year and discharge for rehab to the nursing home.  11/01/2022. This date patient arrived in wheelchair.  Patient lives in a mobile home with 3 steps to enter. Patient reports they busy building for him a ramp. Patient unable  to get with wheelchair through doors to bedroom and bathroom.  Wheelchair to hide upon further assessment arm rest off on the left and break broken. Patient reportedly has crutches and a walker. His daughter and 2 grandsons is living with him for another month. Compared to last week patient reported eating better and doing better with the Foley catheter. Not doing exercises. Reviewed with patient and recommended 150 minutes a week.  He can divide that in 4 sessions of 6-7 minutes. Reviewed with patient some exercises that he done at SNF for rehab- and importance. -Patient to practice out of lift chair or dining room chair with 2 pillows sit and stand.(Patient was independent sit to stand using 2 pillows in chair) -In sitting can do marching or with family standing holding on the  chair, counter or walker( pt was able to march Supervision for 1 min holding on to 2 chairs) -Abduction of knees and pillow -Ankle dorsi and plantar flexion in sitting -In sitting knee extention and flexion  Provided pt with info on Hospice store or [employer] and more - for cheaper DME ( New w/c to fit thru doors and [facility])  Pt interested in CARE program but do not have transportation -using [facility] transportation Will check into it  Pt to follow up in 2 wks to assess progress an follow thru with home program                                     Visit Diagnosis: Weakness    Problem List Patient Active Problem List   Diagnosis Date Noted   Noncompliance with medication regimen 11/17/2022   Physical deconditioning 11/17/2022   Alcohol abuse 10/01/2022   Delirium tremens ([place]) 10/01/2022   Septicemia due to E. coli ([place]) 09/29/2022   Overweight (BMI 25.0-29.9) 09/28/2022   Thrombocytopenia ([place]) 09/28/2022   Severe sepsis secondary to UTI ([place]) 09/27/2022   Indwelling Foley catheter present 09/27/2022   Prostate cancer metastatic to bone ([place]) 09/27/2022   AKI (acute kidney injury) ([place]) 71/24/5809   Acute metabolic encephalopathy 98/33/8250   Lymphadenopathy 09/24/2022   Alcohol use 09/24/2022   Acute gout 09/24/2022   Folate deficiency 09/24/2022   HLD (hyperlipidemia) 04/10/2021   Anemia, chronic disease 07/21/2018   Acute gouty arthropathy 05/18/2016   Erectile dysfunction 05/18/2016   Diabetes mellitus without complication ([place]) 53/97/6734   Essential hypertension 04/16/2016    [name], OTR/L,CLT 12/05/2022, 2:33 PM  [place] [facility] at [facility] [address], [place], [place], [address] Phone: [phone]   Fax:  [phone]  Name: Roger [name] MRN: [id number] Date of Birth: 01-Feb-1956

## 2022-12-05 NOTE — Telephone Encounter (Signed)
Contacted pt to let him know that the appts in Feb will be cancelled and we will r/s once Dr. Erlene Quan refers him back to [place]. Pt also requesting for disability forms to be faxed to Dr. Cherrie Gauze office and a call back confirming that it has been faxed.   Please cancel appts on 2/6. Pt aware.

## 2022-12-06 ENCOUNTER — Telehealth: Payer: Self-pay

## 2022-12-06 NOTE — Telephone Encounter (Signed)
Noted. Will work on this

## 2022-12-06 NOTE — Telephone Encounter (Signed)
Received forms on patient for renewal disability from Cancer center. See their phone note on 12/05/21. Per their note oncologist is not going to fill these out since he will be following with our office for his Prostate Cancer now.  I have the forms on my desk. I see patient has had several appointments with [facility] and has upcoming appointment on 01/01/23 with [facility] also. Which provider needs to fill this out? Thank you

## 2022-12-06 NOTE — Telephone Encounter (Signed)
Do your best filling out as much as possible (or pass them to [place] or [place]) and then [place] or I can sign them.  Hollice Espy, MD

## 2022-12-10 NOTE — Telephone Encounter (Signed)
Pending providers over view and signature

## 2022-12-11 ENCOUNTER — Encounter: Payer: Self-pay | Admitting: Urology

## 2022-12-11 NOTE — Telephone Encounter (Signed)
Form faxed over today.

## 2022-12-12 ENCOUNTER — Inpatient Hospital Stay (HOSPITAL_BASED_OUTPATIENT_CLINIC_OR_DEPARTMENT_OTHER): Payer: 59 | Admitting: Hospice and Palliative Medicine

## 2022-12-12 DIAGNOSIS — C61 Malignant neoplasm of prostate: Secondary | ICD-10-CM

## 2022-12-12 DIAGNOSIS — C7951 Secondary malignant neoplasm of bone: Secondary | ICD-10-CM

## 2022-12-12 NOTE — Progress Notes (Signed)
Multidisciplinary Oncology Council Documentation  ADDIEL MCCARDLE was presented by our [facility] on 12/12/2022, which included representatives from:  Palliative Care Dietitian  Physical/Occupational Therapist Nurse Navigator Genetics Speech Therapist Social work Survivorship RN Financial Navigator Research RN   Joanna currently presents with history of prostate cancer  We reviewed previous medical and familial history, history of present illness, and recent lab results along with all available histopathologic and imaging studies. The [place] considered available treatment options and made the following recommendations/referrals:  SW, rehab screening  The MOC is a meeting of clinicians from various specialty areas who evaluate and discuss patients for whom a multidisciplinary approach is being considered. Final determinations in the plan of care are those of the provider(s).   Today's extended care, comprehensive team conference, Daeron was not present for the discussion and was not examined.

## 2022-12-13 ENCOUNTER — Telehealth: Payer: Self-pay

## 2022-12-13 NOTE — Telephone Encounter (Signed)
1140 am.  Phone call made to patient to schedule a home visit for next week.  Patient agreeable to next Thursday at 9 am.

## 2022-12-19 ENCOUNTER — Inpatient Hospital Stay: Payer: 59

## 2022-12-19 ENCOUNTER — Inpatient Hospital Stay: Payer: 59 | Admitting: Occupational Therapy

## 2022-12-19 NOTE — Progress Notes (Signed)
Nutrition Follow-up:  Patient with metastatic prostate cancer to bone.  Noted care turned over to Urology.  Spoke with patient via phone for nutrition follow-up.  Patient reports that appetite is better and thinks he has gained 6-7 lb. Says that he has been eating sardines, fish, spaghetti, pork chop, grapes, banana, sweets.  Says that ensure caused diarrhea.     Anthropometrics:   Weight no new recorded   NUTRITION DIAGNOSIS: Inadequate oral intake improved, somewhat    INTERVENTION:  Encouraged patient to continue good sources of protein. Reviewed foods that contain protein    NEXT VISIT: no follow-up Not currently being followed by cancer center MD.    Desmond Lope B. Zenia Resides, [place], [place] Registered Dietitian [phone]

## 2022-12-20 ENCOUNTER — Other Ambulatory Visit: Payer: 59

## 2022-12-20 VITALS — BP 114/74 | HR 104 | Temp 97.5°F

## 2022-12-20 DIAGNOSIS — Z515 Encounter for palliative care: Secondary | ICD-10-CM

## 2022-12-20 NOTE — Progress Notes (Signed)
PATIENT NAME: Roger [name] DOB: Jun 16, 1956 MRN: [id number]  PRIMARY CARE PROVIDER: Lahoma Rocker, MD  RESPONSIBLE PARTY:  Acct ID - Guarantor Home Phone Work Phone Relationship Acct Type  [id number] Roger [name], [name] Self P/F     [address] [place], [place] [address]   Home visit completed with patient.  Appetite:  Patient endorses improvement since my last visit.  He is followed by a dietician at the cancer center.  Patient states he is able to cook meals while he is in his wheelchair.  Patient endorses last meal was last night, he has not had breakfast.  He is taking Ensure but 1/2 bottle as full bottle typically causes diarrhea.  Diabetes:  Patient states he is not checking blood sugars.  Continues on metformin.  No s/s of hypo/hyperglycemia.   Foley Cath:  Remains in place with no issues.  Patient is being seeing by urology for monthly changes.  Urine is clear yellow today.    Functional Status:  Overall improvement in strength.  Patient is able to ambulate short distances in his home.  He is mostly using crutches to get to the bathroom as his wheelchair is to wide to go down the hallway.  Patient now has a ramp leading into his home and a small ramp from the kitchen to the den area.   Medication Management:   Currently taking medications on his own.  We discussed a pill box and patient is interested.  Patient advised I would check with our office to see if we have any and will bring on next visit.   Resources:  Patient states he does not currently have a PCP.  We discussed [place] and patient would like more information.  I will have [facility] follow up with patient.    CODE STATUS: Full ADVANCED DIRECTIVES: No MOST FORM: No PPS: 50%-weak   PHYSICAL EXAM:   VITALS: Today's Vitals   12/20/22 0912  BP: 114/74  Pulse: (!) 104  Temp: (!) 97.5 F (36.4 C)  SpO2: 97%    LUNGS: clear to auscultation  CARDIAC: Cor Tachy}  EXTREMITIES:  left lower extremity with 1+ pitting edema SKIN: Skin color, texture, turgor normal. No rashes or lesions or normal  NEURO: positive for gait problems       Lorenza Burton, RN

## 2022-12-31 NOTE — Progress Notes (Deleted)
Cath Change/ Replacement  Patient is present today for a catheter change due to urinary retention.  ***ml of water was removed from the balloon, a ***FR foley cath was removed without difficulty.  Patient was cleaned and prepped in a sterile fashion with betadine and 2% lidocaine jelly was instilled into the urethra. A *** FR foley cath was replaced into the bladder, {dnt complications:20057}. Urine return was noted ***ml and urine was *** in color. The balloon was filled with 10ml of sterile water. A *** bag was attached for drainage.  A night bag was also given to the patient and patient was given instruction on how to change from one bag to another. Patient was given proper instruction on catheter care.    Performed by: ***  Follow up: ***  

## 2023-01-01 ENCOUNTER — Encounter: Payer: 59 | Admitting: Urology

## 2023-01-01 ENCOUNTER — Ambulatory Visit: Payer: 59 | Admitting: Oncology

## 2023-01-01 ENCOUNTER — Other Ambulatory Visit: Payer: 59

## 2023-01-01 DIAGNOSIS — C61 Malignant neoplasm of prostate: Secondary | ICD-10-CM

## 2023-01-01 DIAGNOSIS — R339 Retention of urine, unspecified: Secondary | ICD-10-CM

## 2023-01-02 ENCOUNTER — Other Ambulatory Visit: Payer: 59

## 2023-01-02 VITALS — BP 132/68 | HR 92 | Temp 98.1°F

## 2023-01-02 DIAGNOSIS — Z515 Encounter for palliative care: Secondary | ICD-10-CM

## 2023-01-02 NOTE — Progress Notes (Signed)
PATIENT NAME: Roger [name] DOB: [birth year] MRN: [id number]  PRIMARY CARE PROVIDER: Lahoma Rocker, MD  RESPONSIBLE PARTY:  Acct ID - Guarantor Home Phone Work Phone Relationship Acct Type  [id number] [name], [name] Self P/F     [address] [place],  [address]  Home visit completed with patient.  Connected with daughter Roger [name] by phone for part of this visit.   Foley Cath:  Patient was scheduled yesterday for a foley cath change but was unable to make it due to lack of transportation.  Patient contact [facility] and they are able to schedule for Friday at 11.  Patient has requested transportation but this will have to be approved by the office manager for [facility].  Patient is waiting for a call back hopefully today to see if approval is given.  I have also contacted [facility] SW to see if there are any other options to assist with transportation.  Foley cath in place and draining clear yellow urine.  Medication Management:  Pill box provided and filled today.  Please see current medication list for active meds.  Patient does not have many medications but does not have a PCP in place either.     Pain:  Patient endorses pain to bilateral feet but unable to describe pain.  States, "its really hard to explain, it is really weird."  Patient endorses occasionally back and hip.  Continues with oxycodone daily or every other day.  Patient reports this is effective.   Resources:  [facility] does not accept patient's current insurance but will be able to accept Medicare once he turns 40 this summer.  Patient and daughter updated on this. Patient is currently getting short term disability checks and has also applied for Medicaid.      CODE STATUS: Full ADVANCED DIRECTIVES: No MOST FORM: No PPS: 40%   PHYSICAL EXAM:   VITALS: Today's Vitals   01/02/23 0843  BP: 132/68  Pulse: 92  Temp: 98.1 F (36.7 C)  SpO2: 95%    LUNGS: clear to auscultation  CARDIAC: Cor RRR}   EXTREMITIES: - for edema SKIN: Skin color, texture, turgor normal. No rashes or lesions or mobility and turgor normal  NEURO: positive for gait problems and weakness       Roger Burton, RN

## 2023-01-04 ENCOUNTER — Ambulatory Visit: Payer: 59 | Admitting: Physician Assistant

## 2023-01-04 DIAGNOSIS — R339 Retention of urine, unspecified: Secondary | ICD-10-CM

## 2023-01-04 NOTE — Progress Notes (Signed)
Cath Change/ Replacement  Patient is present today for a catheter change due to urinary retention.  75m of water was removed from the balloon, a 16FR coude foley cath was removed without difficulty.  Patient was cleaned and prepped in a sterile fashion with betadine and 2% lidocaine jelly was instilled into the urethra. A 16 FR coude foley cath was replaced into the bladder, no complications were noted. Urine return was noted 258mand urine was yellow in color. The balloon was filled with 1078mf sterile water. A night bag was attached for drainage.  A night bag was also given to the patient and patient was given instruction on how to change from one bag to another. Patient was given proper instruction on catheter care.    Performed by: SamDebroah LoopA-C   Additional notes: Discussed that since he has initiated ADT, we may attempt a voiding trial as I suspect his prostate has reduced in size with the ultimate goal of getting him catheter free.  He was initially in agreement with this plan, but subsequently reported some hesitancy because he states he has a difficult time getting to the bathroom due to poor ambulation.  He feels that the indwelling catheter may actually be making urinary management easier for him.  We discussed that keeping a Foley catheter long-term requires weighing the risks of UTI versus the benefit of urinary management.  Ultimately, if his quality of life is improved with a chronic Foley, I am in agreement with keeping in place.  He is undecided today.  Will schedule a 2 part voiding trial for next month and may simply exchange the catheter per patient request at that time.  Follow up: Return in about 4 weeks (around 02/01/2023) for Voiding trial vs catheter change.

## 2023-01-30 ENCOUNTER — Other Ambulatory Visit: Payer: 59

## 2023-01-30 VITALS — BP 110/80 | HR 88 | Temp 97.5°F

## 2023-01-30 DIAGNOSIS — Z515 Encounter for palliative care: Secondary | ICD-10-CM

## 2023-01-30 NOTE — Progress Notes (Signed)
PATIENT NAME: Roger [name] DOB: 30-Jan-1956 MRN: [id number]  PRIMARY CARE PROVIDER: Lahoma Rocker, MD  RESPONSIBLE PARTY:  Acct ID - Guarantor Home Phone Work Phone Relationship Acct Type  [id number] RANDALE, [id number] [phone] Self P/F     [address] [place], [place] [address]   Home visit completed with patient and cousin [place].  ACP:  Reviewed with patient.  He does not wish to have CPR and would like to change his status to DNR.  Does not currently have a living will in place but is wanting to start this process.  I will have our Palliative Care SW follow up with living will documents.   Disability/Medicaid:  Patient is needing assistance with disability renewal form.  Advised patient this would need to be completed by provider who completed the original form.  Patient's daughter will take form to Dr. Hollice Espy to be completed.  Phone call made to [place] to follow up on [facility] application.  Message had left requesting a call back to patient with update on application status.   Loss of Appetite:  Patient has not eaten anything in the last 24 hours.  Attempted to eat some watermelon yesterday but became nauseated. Desires to eat but becomes nauseated when trying to eat.  Mostly consuming liquids like water and Sunny D.  Unable to tolerate nutritional drinks like Ensure/Boost.  Patient advised he tried supplement drinks but this caused diarrhea so he stopped taking them about 1 month ago. Appears thinner with muscle wasting noted.  Patient advised clothing is fitting looser now.   Hospice:  Discussed hospice at length with patient. He was diagnosed last October/November with prostate cancer.  He would like to try and manage symptoms and continue with treatment if possible.  If patient continues with a decline he would like to revisit hospice support.   Nausea/Vomiting/Diarrhea:  Started a little over a week ago.  Patient did take Loperamide 2 tablets one  day.  He said it was effective but he did not take any more.  Does not have any antiemetics in the home.  Medication Management:  Not using the pill box that was set up last month.  Not consistent with medications due to above symptoms.    Pain Management: Out of oxycodone and needing refill. Will follow up with cancer center on refill.  PCP:  Patient is not established with a PCP but is schedule in May to establish services.  Phone call made to practice to see if patient could be seen sooner.  [facility] March 28 @ 320pm has been scheduled.  Patient has agreed but needs to work on transportation there.      Follow up:  secure message sent to [name], NP to advise of above.    CODE STATUS: Full-desires DNR status ADVANCED DIRECTIVES: No MOST FORM: No PPS: 40%   PHYSICAL EXAM:   VITALS: Today's Vitals   01/30/23 0900  BP: 110/80  Pulse: 88  Temp: (!) 97.5 F (36.4 C)  SpO2: 95%    LUNGS: clear to auscultation  CARDIAC: Cor RRR}  EXTREMITIES: - for edema SKIN: Skin color, texture, turgor normal. No rashes or lesions or redness present to buttocks.  Discussed use of barrier cream.   NEURO: positive for gait issues and weakness.        Lorenza Burton, RN

## 2023-01-31 ENCOUNTER — Telehealth: Payer: Self-pay | Admitting: Physician Assistant

## 2023-01-31 ENCOUNTER — Telehealth: Payer: Self-pay | Admitting: Urology

## 2023-01-31 NOTE — Telephone Encounter (Signed)
err

## 2023-01-31 NOTE — Telephone Encounter (Signed)
Patient dropped off document Disability, to be filled out by provider. Patient requested to send it via Fax within ASAP. Document is located in providers tray at front office.Please advise at Mobile [phone] (mobile)  Patient would also like the form mailed back after its faxed to him so he has a copy

## 2023-02-04 ENCOUNTER — Ambulatory Visit: Payer: 59 | Admitting: Physician Assistant

## 2023-02-04 ENCOUNTER — Encounter: Payer: 59 | Admitting: Physician Assistant

## 2023-02-05 ENCOUNTER — Ambulatory Visit (INDEPENDENT_AMBULATORY_CARE_PROVIDER_SITE_OTHER): Payer: 59 | Admitting: Physician Assistant

## 2023-02-05 ENCOUNTER — Encounter: Payer: Self-pay | Admitting: Physician Assistant

## 2023-02-05 ENCOUNTER — Ambulatory Visit: Payer: 59 | Admitting: Physician Assistant

## 2023-02-05 DIAGNOSIS — R339 Retention of urine, unspecified: Secondary | ICD-10-CM | POA: Diagnosis not present

## 2023-02-05 LAB — BLADDER SCAN AMB NON-IMAGING

## 2023-02-05 NOTE — Progress Notes (Signed)
02/05/2023 3:56 PM   Roger [name] 04-04-1956 [id number]  CC: Chief Complaint  Patient presents with   Urinary Retention    HPI: Roger [name] is a 67 y.o. male with PMH advanced metastatic prostate cancer on ADT and urinary retention who presents today for voiding trial.   Foley catheter removed in clinic in the morning, see separate procedure note for details.  He returned to clinic in the afternoon for PVR.  He reports drinking half of beer and a bottle of water throughout the day.  He has not urinated, nor does he feel the urge to do so. Bladder scan on arrival 114m.  PMH: Past Medical History:  Diagnosis Date   Chest pain    a. 04/2016 MV: small, mild apical defect c/w apical ischemia, EF 55-65%-->Low risk study.   Diabetes mellitus without complication ([place]    Gout    Hematochezia    History of echocardiogram    a. 04/2016 Echo: EF 55-60%, mild LVH, no rwma, midly dil LA.   History of heart attack    Hypertension    Morbid [place]     Surgical History: Past Surgical History:  Procedure Laterality Date   COLONOSCOPY WITH PROPOFOL N/A 08/05/2018   Procedure: COLONOSCOPY WITH PROPOFOL;  Surgeon: VLin Landsman MD;  [facility];  Service: Gastroenterology;  Laterality: N/A;   thumb surgery      Home Medications:  Allergies as of 02/05/2023       Reactions   Albuterol Nausea And Vomiting   Mucinex [guaifenesin Er] Rash        Medication List        Accurate as of February 05, 2023  3:56 PM. If you have any questions, ask your nurse or doctor.          STOP taking these medications    allopurinol 100 MG tablet Commonly known as: ZYLOPRIM Stopped by: SDebroah Loop PA-C   aspirin EC 81 MG tablet Stopped by: SDebroah Loop PA-C   folic acid 1 MG tablet Commonly known as: FOLVITE Stopped by: SDebroah Loop PA-C   Gemtesa 75 MG Tabs Generic drug: Vibegron Stopped by: SDebroah Loop PA-C    metoprolol tartrate 25 MG tablet Commonly known as: LOPRESSOR Stopped by: SDebroah Loop PA-C   pantoprazole 40 MG tablet Commonly known as: PROTONIX Stopped by: SDebroah Loop PA-C   triamterene-hydrochlorothiazide 37.5-25 MG tablet Commonly known as: MAXZIDE-25 Stopped by: SDebroah Loop PA-C   Uloric 40 MG tablet Generic drug: febuxostat Stopped by: SDebroah Loop PA-C       TAKE these medications    apalutamide 240 MG tablet Commonly known as: ERLEADA Take 1 tablet (240 mg total) by mouth daily.   isosorbide mononitrate 30 MG 24 hr tablet Commonly known as: IMDUR TAKE 1 TABLET(30 MG) BY MOUTH DAILY   metFORMIN 500 MG tablet Commonly known as: GLUCOPHAGE Take 1 tablet (500 mg total) by mouth 2 (two) times daily with a meal.   oxyCODONE 5 MG immediate release tablet Commonly known as: Roxicodone Take 1 tablet (5 mg total) by mouth every 6 (six) hours as needed for severe pain.   predniSONE 5 MG tablet Commonly known as: DELTASONE Take 5 mg by mouth daily with breakfast.   rosuvastatin 10 MG tablet Commonly known as: Crestor Take 1 tablet (10 mg total) by mouth daily.        Allergies:  Allergies  Allergen Reactions   Albuterol Nausea And Vomiting   Mucinex [Guaifenesin  Er] Rash    Family History: Family History  Problem Relation Age of Onset   Heart Problems Mother    Breast cancer Mother    Mental illness Father    Heart disease Father    Stroke Father    Diabetes Father    Alcohol abuse Father    Bipolar disorder Father    Heart disease Other    Hypertension Other    Diabetes Other     Social History:   reports that he quit smoking about 30 years ago. His smoking use included cigarettes. He has a 6.25 pack-year smoking history. He has been exposed to tobacco smoke. He has never used smokeless tobacco. He reports current alcohol use of about 42.0 standard drinks of alcohol per week. He reports that he does  not use drugs.  Physical Exam: There were no vitals taken for this visit.  Constitutional:  Alert and oriented, no acute distress, nontoxic appearing HEENT: [place], AT Cardiovascular: No clubbing, cyanosis, or edema Respiratory: Normal respiratory effort, no increased work of breathing Skin: No rashes, bruises or suspicious lesions Neurologic: Grossly intact, no focal deficits, moving all 4 extremities Psychiatric: Normal mood and affect  Laboratory Data: Results for orders placed or performed in visit on 02/05/23  Bladder Scan (Post Void Residual) in office  Result Value Ref Range   Scan Result 181m    Assessment & Plan:   1. Urinary retention Voiding trial equivocal today due to poor PO intake, inadequate urine production. We discussed Foley replacement with plans for repeat voiding trial in the future vs trial without catheter with repeat PVR tomorrow vs trial without catheter with return precautions and he elected for the latter.  Counseled him to RTC or go to the ED if unable to void and having pain. Will plan for office visit in 2 weeks for repeat PVR regardless. - Bladder Scan (Post Void Residual) in office  Return in about 2 weeks (around 02/19/2023) for Repeat PVR.  SDebroah Loop PA-C  [facility] [address] [place]  [phone]

## 2023-02-05 NOTE — Progress Notes (Signed)
Catheter Removal  Patient is present today for a catheter removal.  77m of water was drained from the balloon. A 16 Coude FR foley cath was removed from the bladder, no complications were noted. Patient tolerated well.  Performed by: ALanice SchwabCMA  Follow up/ Additional notes: F/UP pm PVR

## 2023-02-06 ENCOUNTER — Telehealth: Payer: Self-pay

## 2023-02-06 NOTE — Telephone Encounter (Signed)
220 pm.  Follow up call made to patient to follow up on overall condition.  Call unsuccessful.  Message left.

## 2023-02-07 ENCOUNTER — Inpatient Hospital Stay
Admission: EM | Admit: 2023-02-07 | Discharge: 2023-02-11 | DRG: 872 | Disposition: A | Discharge status: Home Health | Payer: 59 | Attending: Family Medicine | Admitting: Family Medicine

## 2023-02-07 ENCOUNTER — Other Ambulatory Visit: Payer: Self-pay

## 2023-02-07 ENCOUNTER — Emergency Department: Payer: 59

## 2023-02-07 DIAGNOSIS — I10 Essential (primary) hypertension: Secondary | ICD-10-CM | POA: Diagnosis present

## 2023-02-07 DIAGNOSIS — Z1152 Encounter for screening for COVID-19: Secondary | ICD-10-CM | POA: Diagnosis not present

## 2023-02-07 DIAGNOSIS — C61 Malignant neoplasm of prostate: Secondary | ICD-10-CM | POA: Diagnosis present

## 2023-02-07 DIAGNOSIS — E871 Hypo-osmolality and hyponatremia: Secondary | ICD-10-CM | POA: Diagnosis present

## 2023-02-07 DIAGNOSIS — Z7984 Long term (current) use of oral hypoglycemic drugs: Secondary | ICD-10-CM | POA: Diagnosis not present

## 2023-02-07 DIAGNOSIS — Z79899 Other long term (current) drug therapy: Secondary | ICD-10-CM | POA: Diagnosis not present

## 2023-02-07 DIAGNOSIS — Z794 Long term (current) use of insulin: Secondary | ICD-10-CM

## 2023-02-07 DIAGNOSIS — E876 Hypokalemia: Secondary | ICD-10-CM | POA: Diagnosis present

## 2023-02-07 DIAGNOSIS — N390 Urinary tract infection, site not specified: Secondary | ICD-10-CM | POA: Diagnosis present

## 2023-02-07 DIAGNOSIS — E1169 Type 2 diabetes mellitus with other specified complication: Secondary | ICD-10-CM

## 2023-02-07 DIAGNOSIS — D7589 Other specified diseases of blood and blood-forming organs: Secondary | ICD-10-CM | POA: Diagnosis present

## 2023-02-07 DIAGNOSIS — R531 Weakness: Secondary | ICD-10-CM

## 2023-02-07 DIAGNOSIS — E538 Deficiency of other specified B group vitamins: Secondary | ICD-10-CM | POA: Diagnosis present

## 2023-02-07 DIAGNOSIS — A419 Sepsis, unspecified organism: Secondary | ICD-10-CM | POA: Diagnosis not present

## 2023-02-07 DIAGNOSIS — N179 Acute kidney failure, unspecified: Secondary | ICD-10-CM | POA: Diagnosis present

## 2023-02-07 DIAGNOSIS — E860 Dehydration: Secondary | ICD-10-CM | POA: Diagnosis present

## 2023-02-07 DIAGNOSIS — C7951 Secondary malignant neoplasm of bone: Secondary | ICD-10-CM | POA: Diagnosis present

## 2023-02-07 DIAGNOSIS — D638 Anemia in other chronic diseases classified elsewhere: Secondary | ICD-10-CM | POA: Diagnosis present

## 2023-02-07 DIAGNOSIS — R6520 Severe sepsis without septic shock: Secondary | ICD-10-CM | POA: Diagnosis present

## 2023-02-07 DIAGNOSIS — I252 Old myocardial infarction: Secondary | ICD-10-CM

## 2023-02-07 DIAGNOSIS — R509 Fever, unspecified: Secondary | ICD-10-CM

## 2023-02-07 DIAGNOSIS — R197 Diarrhea, unspecified: Secondary | ICD-10-CM | POA: Diagnosis present

## 2023-02-07 DIAGNOSIS — A0472 Enterocolitis due to Clostridium difficile, not specified as recurrent: Secondary | ICD-10-CM | POA: Diagnosis present

## 2023-02-07 DIAGNOSIS — Z978 Presence of other specified devices: Secondary | ICD-10-CM | POA: Diagnosis present

## 2023-02-07 DIAGNOSIS — E119 Type 2 diabetes mellitus without complications: Secondary | ICD-10-CM | POA: Diagnosis present

## 2023-02-07 DIAGNOSIS — Z789 Other specified health status: Secondary | ICD-10-CM | POA: Diagnosis present

## 2023-02-07 DIAGNOSIS — E440 Moderate protein-calorie malnutrition: Secondary | ICD-10-CM | POA: Diagnosis present

## 2023-02-07 HISTORY — DX: Malignant neoplasm of prostate: C61

## 2023-02-07 LAB — CBC WITH DIFFERENTIAL/PLATELET
Abs Immature Granulocytes: 0.86 10*3/uL — ABNORMAL HIGH (ref 0.00–0.07)
Basophils Absolute: 0.1 10*3/uL (ref 0.0–0.1)
Basophils Relative: 0 %
Eosinophils Absolute: 0.1 10*3/uL (ref 0.0–0.5)
Eosinophils Relative: 0 %
HCT: 24.7 % — ABNORMAL LOW (ref 39.0–52.0)
Hemoglobin: 7.2 g/dL — ABNORMAL LOW (ref 13.0–17.0)
Immature Granulocytes: 4 %
Lymphocytes Relative: 9 %
Lymphs Abs: 1.9 10*3/uL (ref 0.7–4.0)
MCH: 31.4 pg (ref 26.0–34.0)
MCHC: 29.1 g/dL — ABNORMAL LOW (ref 30.0–36.0)
MCV: 107.9 fL — ABNORMAL HIGH (ref 80.0–100.0)
Monocytes Absolute: 0.5 10*3/uL (ref 0.1–1.0)
Monocytes Relative: 3 %
Neutro Abs: 17 10*3/uL — ABNORMAL HIGH (ref 1.7–7.7)
Neutrophils Relative %: 84 %
Platelets: 241 10*3/uL (ref 150–400)
RBC: 2.29 MIL/uL — ABNORMAL LOW (ref 4.22–5.81)
RDW: 16.2 % — ABNORMAL HIGH (ref 11.5–15.5)
WBC: 20.4 10*3/uL — ABNORMAL HIGH (ref 4.0–10.5)
nRBC: 0 % (ref 0.0–0.2)

## 2023-02-07 LAB — URINALYSIS, W/ REFLEX TO CULTURE (INFECTION SUSPECTED)
Bilirubin Urine: NEGATIVE
Glucose, UA: NEGATIVE mg/dL
Hgb urine dipstick: NEGATIVE
Ketones, ur: NEGATIVE mg/dL
Nitrite: POSITIVE — AB
Protein, ur: NEGATIVE mg/dL
Specific Gravity, Urine: 1.013 (ref 1.005–1.030)
pH: 5 (ref 5.0–8.0)

## 2023-02-07 LAB — COMPREHENSIVE METABOLIC PANEL
ALT: 6 U/L (ref 0–44)
AST: 18 U/L (ref 15–41)
Albumin: 2.1 g/dL — ABNORMAL LOW (ref 3.5–5.0)
Alkaline Phosphatase: 165 U/L — ABNORMAL HIGH (ref 38–126)
Anion gap: 13 (ref 5–15)
BUN: 19 mg/dL (ref 8–23)
CO2: 20 mmol/L — ABNORMAL LOW (ref 22–32)
Calcium: 6.9 mg/dL — ABNORMAL LOW (ref 8.9–10.3)
Chloride: 100 mmol/L (ref 98–111)
Creatinine, Ser: 1.38 mg/dL — ABNORMAL HIGH (ref 0.61–1.24)
GFR, Estimated: 57 mL/min — ABNORMAL LOW (ref >60)
Glucose, Bld: 92 mg/dL (ref 70–99)
Potassium: 2.5 mmol/L — CL (ref 3.5–5.1)
Sodium: 133 mmol/L — ABNORMAL LOW (ref 135–145)
Total Bilirubin: 1.9 mg/dL — ABNORMAL HIGH (ref 0.3–1.2)
Total Protein: 5.7 g/dL — ABNORMAL LOW (ref 6.5–8.1)

## 2023-02-07 LAB — LACTIC ACID, PLASMA
Lactic Acid, Venous: 2 mmol/L (ref 0.5–1.9)
Lactic Acid, Venous: 2.2 mmol/L (ref 0.5–1.9)

## 2023-02-07 LAB — PROTIME-INR
INR: 1.3 — ABNORMAL HIGH (ref 0.8–1.2)
Prothrombin Time: 16.1 seconds — ABNORMAL HIGH (ref 11.4–15.2)

## 2023-02-07 LAB — RESP PANEL BY RT-PCR (RSV, FLU A&B, COVID)  RVPGX2
Influenza A by PCR: NEGATIVE
Influenza B by PCR: NEGATIVE
Resp Syncytial Virus by PCR: NEGATIVE
SARS Coronavirus 2 by RT PCR: NEGATIVE

## 2023-02-07 LAB — APTT: aPTT: 35 seconds (ref 24–36)

## 2023-02-07 MED ORDER — SODIUM CHLORIDE 0.9 % IV BOLUS (SEPSIS)
2000.0000 mL | Freq: Once | INTRAVENOUS | Status: AC
  Administered 2023-02-07: 2000 mL via INTRAVENOUS

## 2023-02-07 MED ORDER — INSULIN ASPART 100 UNIT/ML IJ SOLN: 0.0000 [IU] | Freq: Three times a day (TID) | INTRAMUSCULAR | Status: DC

## 2023-02-07 MED ORDER — POLYETHYLENE GLYCOL 3350 17 G PO PACK: 17.0000 g | PACK | Freq: Every day | ORAL | Status: DC | PRN

## 2023-02-07 MED ORDER — POTASSIUM CHLORIDE 10 MEQ/100ML IV SOLN
10.0000 meq | INTRAVENOUS | Status: AC
  Administered 2023-02-07 – 2023-02-08 (×4): 10 meq via INTRAVENOUS
  Filled 2023-02-07 (×4): qty 100

## 2023-02-07 MED ORDER — POTASSIUM CHLORIDE 20 MEQ PO PACK
40.0000 meq | PACK | Freq: Once | ORAL | Status: AC
  Administered 2023-02-07: 40 meq via ORAL
  Filled 2023-02-07: qty 2

## 2023-02-07 MED ORDER — ACETAMINOPHEN 325 MG PO TABS: 650.0000 mg | ORAL_TABLET | Freq: Four times a day (QID) | ORAL | Status: DC | PRN

## 2023-02-07 MED ORDER — ROSUVASTATIN CALCIUM 10 MG PO TABS
10.0000 mg | ORAL_TABLET | Freq: Every day | ORAL | Status: DC
  Administered 2023-02-08 – 2023-02-11 (×4): 10 mg via ORAL
  Filled 2023-02-07 (×4): qty 1

## 2023-02-07 MED ORDER — DOCUSATE SODIUM 100 MG PO CAPS
100.0000 mg | ORAL_CAPSULE | Freq: Two times a day (BID) | ORAL | Status: DC
  Filled 2023-02-07: qty 1

## 2023-02-07 MED ORDER — ONDANSETRON HCL 4 MG/2ML IJ SOLN
4.0000 mg | Freq: Four times a day (QID) | INTRAMUSCULAR | Status: DC | PRN
  Administered 2023-02-07: 4 mg via INTRAVENOUS
  Filled 2023-02-07: qty 2

## 2023-02-07 MED ORDER — LACTATED RINGERS IV SOLN: INTRAVENOUS | Status: AC

## 2023-02-07 MED ORDER — APALUTAMIDE 240 MG PO TABS: 240.0000 mg | ORAL_TABLET | Freq: Every day | ORAL | Status: DC

## 2023-02-07 MED ORDER — OXYCODONE HCL 5 MG PO TABS
5.0000 mg | ORAL_TABLET | Freq: Four times a day (QID) | ORAL | Status: DC | PRN
  Administered 2023-02-09: 5 mg via ORAL
  Filled 2023-02-07: qty 1

## 2023-02-07 MED ORDER — BISACODYL 5 MG PO TBEC: 5.0000 mg | DELAYED_RELEASE_TABLET | Freq: Every day | ORAL | Status: DC | PRN

## 2023-02-07 MED ORDER — SODIUM CHLORIDE 0.9 % IV SOLN
2.0000 g | Freq: Once | INTRAVENOUS | Status: AC
  Administered 2023-02-07: 2 g via INTRAVENOUS
  Filled 2023-02-07: qty 12.5

## 2023-02-07 MED ORDER — ACETAMINOPHEN 650 MG RE SUPP: 650.0000 mg | Freq: Four times a day (QID) | RECTAL | Status: DC | PRN

## 2023-02-07 NOTE — ED Notes (Signed)
Provider notified pt K: 2.5

## 2023-02-07 NOTE — Assessment & Plan Note (Signed)
CBC shows macrocytosis differentials include from folate deficiency versus question alcohol abuse, question if patient has any thyroid issues we will evaluate and follow .

## 2023-02-07 NOTE — ED Provider Notes (Signed)
[facility] Provider Note   Event Date/Time   First MD Initiated Contact with Patient 02/07/23 1859     (approximate) History  Weakness  HPI Roger [name] is a 67 y.o. male with a stated past medical history of stage IV prostate cancer with recent Foley catheter removal 3 days prior to arrival who presents complaining of generalized weakness it has been worsening over the last 3 days as well as fever that was found today to 101 Fahrenheit.  EMS found patient to be febrile to 100.4.  No antipyretics given prior to arrival. ROS: Patient currently denies any vision changes, tinnitus, difficulty speaking, facial droop, sore throat, chest pain, shortness of breath, abdominal pain, nausea/vomiting/diarrhea, dysuria, or weakness/numbness/paresthesias in any extremity   Physical Exam  Triage Vital Signs: ED Triage Vitals  Enc Vitals Group     BP      Pulse      Resp      Temp      Temp src      SpO2      Weight      Height      Head Circumference      Peak Flow      Pain Score      Pain Loc      Pain Edu?      Excl. in [place]?    Most recent vital signs: Vitals:   02/07/23 2309 02/07/23 2315  BP: 119/78   Pulse: 92 92  Resp:    Temp: 99.3 F (37.4 C)   SpO2: 99% 100%   General: Awake, oriented x4. CV:  Good peripheral perfusion.  Resp:  Normal effort.  Abd:  No distention.  Other:  Middle-aged overweight African-American male laying in bed in no acute distress ED Results / Procedures / Treatments  Labs (all labs ordered are listed, but only abnormal results are displayed) Labs Reviewed  LACTIC ACID, PLASMA - Abnormal; Notable for the following components:      Result Value   Lactic Acid, Venous 2.0 (*)    All other components within normal limits  LACTIC ACID, PLASMA - Abnormal; Notable for the following components:   Lactic Acid, Venous 2.2 (*)    All other components within normal limits  COMPREHENSIVE METABOLIC PANEL - Abnormal; Notable for  the following components:   Sodium 133 (*)    Potassium 2.5 (*)    CO2 20 (*)    Creatinine, Ser 1.38 (*)    Calcium 6.9 (*)    Total Protein 5.7 (*)    Albumin 2.1 (*)    Alkaline Phosphatase 165 (*)    Total Bilirubin 1.9 (*)    GFR, Estimated 57 (*)    All other components within normal limits  CBC WITH DIFFERENTIAL/PLATELET - Abnormal; Notable for the following components:   WBC 20.4 (*)    RBC 2.29 (*)    Hemoglobin 7.2 (*)    HCT 24.7 (*)    MCV 107.9 (*)    MCHC 29.1 (*)    RDW 16.2 (*)    Neutro Abs 17.0 (*)    Abs Immature Granulocytes 0.86 (*)    All other components within normal limits  URINALYSIS, W/ REFLEX TO CULTURE (INFECTION SUSPECTED) - Abnormal; Notable for the following components:   Color, Urine YELLOW (*)    APPearance HAZY (*)    Nitrite POSITIVE (*)    Leukocytes,Ua LARGE (*)    Bacteria, UA FEW (*)  All other components within normal limits  PROTIME-INR - Abnormal; Notable for the following components:   Prothrombin Time 16.1 (*)    INR 1.3 (*)    All other components within normal limits  RESP PANEL BY RT-PCR (RSV, FLU A&B, COVID)  [id number]  CULTURE, BLOOD (ROUTINE X 2)  CULTURE, BLOOD (ROUTINE X 2)  URINE CULTURE  APTT  TYPE AND SCREEN   EKG ED ECG REPORT I, Naaman Plummer, the attending physician, personally viewed and interpreted this ECG. Date: 02/07/2023 EKG Time: 1907 Rate: 94 Rhythm: normal sinus rhythm QRS Axis: normal Intervals: normal ST/T Wave abnormalities: normal Narrative Interpretation: no evidence of acute ischemia RADIOLOGY ED MD interpretation: One-view portable chest x-ray interpreted by me shows no evidence of acute abnormalities including no pneumonia, pneumothorax, or widened mediastinum -Agree with radiology assessment Official radiology report(s): DG Chest Port 1 View  Result Date: 02/07/2023 CLINICAL DATA:  Questionable sepsis EXAM: PORTABLE CHEST 1 VIEW COMPARISON:  Chest x-ray dated October 17, 2022  FINDINGS: The heart size and mediastinal contours are within normal limits. Both lungs are clear. Numerous sclerotic osseous lesions, consistent with patient history of metastatic prostate cancer. IMPRESSION: 1. Lungs are clear. 2. Numerous sclerotic osseous lesions, consistent with patient history of metastatic prostate cancer. Electronically Signed   By: Yetta Glassman M.D.   On: 02/07/2023 19:32   PROCEDURES: Critical Care performed: Yes, see critical care procedure note(s) .1-3 Lead EKG Interpretation  Performed by: Naaman Plummer, MD Authorized by: Naaman Plummer, MD     Interpretation: normal     ECG rate:  91   ECG rate assessment: normal     Rhythm: sinus rhythm     Ectopy: none     Conduction: normal    CRITICAL CARE Performed by: Naaman Plummer  Total critical care time: 37 minutes  Critical care time was exclusive of separately billable procedures and treating other patients.  Critical care was necessary to treat or prevent imminent or life-threatening deterioration.  Critical care was time spent personally by me on the following activities: development of treatment plan with patient and/or surrogate as well as nursing, discussions with consultants, evaluation of patient's response to treatment, examination of patient, obtaining history from patient or surrogate, ordering and performing treatments and interventions, ordering and review of laboratory studies, ordering and review of radiographic studies, pulse oximetry and re-evaluation of patient's condition.  MEDICATIONS ORDERED IN ED: Medications  lactated ringers infusion ( Intravenous New Bag/Given 02/07/23 1959)  potassium chloride 10 mEq in 100 mL IVPB (10 mEq Intravenous New Bag/Given 02/07/23 2246)  ondansetron (ZOFRAN) injection 4 mg (4 mg Intravenous Given 02/07/23 2305)  sodium chloride 0.9 % bolus 2,000 mL (0 mLs Intravenous Stopped 02/07/23 2240)  ceFEPIme (MAXIPIME) 2 g in sodium chloride 0.9 % 100 mL IVPB (0 g  Intravenous Stopped 02/07/23 2026)  potassium chloride (KLOR-CON) packet 40 mEq (40 mEq Oral Given 02/07/23 2122)   IMPRESSION / MDM / ASSESSMENT AND PLAN / ED COURSE  I reviewed the triage vital signs and the nursing notes.                             The patient is on the cardiac monitor to evaluate for evidence of arrhythmia and/or significant heart rate changes. Patient's presentation is most consistent with acute presentation with potential threat to life or bodily function. The Pt presents with dysuria, hypotension highly concerning for sepsis (suspected urinary  source). At this time, the Pt is satting well on room air, normotensive, and appears HDS.  Will start empiric antibiotics and fluids.  Due to hypotension, will administer fluids gradually with frequent reassessment. Have low suspicion for a GI, skin/soft tissue, or CNS source at this time, but will reconsider if initial workup is unremarkable.  - CBC, BMP, LFTs - VBG - UA - BCx x2, Lactate - EKG - CXR - Empiric Abx: Cefepime - Fluids: 30cc/kg NS  UA positive for nitrites, leukocytes, and bacteria.  CBC significant for leukocytosis to 20 with hemoglobin of 7.2.  Patient's metabolic panel also significant for potassium of 2.5, creatinine 1.3, calcium 6.9  Given these symptoms and hypotension on arrival, patient was given empiric 30 cc/kg bolus with improvement in patient's blood pressure as well as normal cap refill of less than 2 seconds.  Dispo: Admit to medicine   FINAL CLINICAL IMPRESSION(S) / ED DIAGNOSES   Final diagnoses:  Weakness  Fever, unspecified fever cause  Urinary tract infection without hematuria, site unspecified  Sepsis without acute organ dysfunction, due to unspecified organism [facility])   Rx / DC Orders   ED Discharge Orders     None      Note:  This document was prepared using Dragon voice recognition software and may include unintentional dictation errors.   Naaman Plummer, MD 02/07/23  [phone]

## 2023-02-07 NOTE — Assessment & Plan Note (Signed)
Patient meets severe sepsis criteria secondary to urinary tract infection. Will continue with IV antibiotic regimen and IV fluids. Additional supportive care with antiemetics and antipyretics. Follow culture sensitivity tailor antibiotics to p.o. regimen.

## 2023-02-07 NOTE — ED Notes (Signed)
Pt refusing placement of urethral catheter, Provider Posey Pronto, MD notified

## 2023-02-07 NOTE — ED Triage Notes (Signed)
Patient arrives from home by EMS after a call for weakness for one week.  Patient had a urinary catheter removed two days ago.  He is also a stage 4 prostate cancer patient.  EMS noted an axillary temp of 100.4.

## 2023-02-07 NOTE — Assessment & Plan Note (Addendum)
    Latest Ref Rng & Units 02/07/2023    7:19 PM 11/15/2022    1:22 PM 10/31/2022   10:05 AM  CBC  WBC 4.0 - 10.5 K/uL 20.4  13.4  14.6   Hemoglobin 13.0 - 17.0 g/dL 7.2  9.0  9.4   Hematocrit 39.0 - 52.0 % 24.7  27.6  29.5   Platelets 150 - 400 K/uL 241  293  300   Anemia is worsening from his December blood work. - type and screen, IV PPI, follow CBC suspect patient will need 1 unit. Stool occult. -repeat h/h pending.

## 2023-02-07 NOTE — Assessment & Plan Note (Signed)
Lab Results  Component Value Date   CREATININE 1.38 (H) 02/07/2023   CREATININE 1.00 11/15/2022   CREATININE 1.03 10/17/2022  Secondary to postobstructive etiology with patient's prostate cancer. Will place an indwelling Foley to further alleviate any obstructive symptoms. Monitor creatinine and avoid contrast studies and renally dose all needed medications.

## 2023-02-07 NOTE — Sepsis Progress Note (Signed)
Elink monitoring for the code sepsis protocol.  

## 2023-02-07 NOTE — Assessment & Plan Note (Signed)
Urology consult as deemed appropriate. Will continue patient on home regimen of Erleada.

## 2023-02-07 NOTE — Assessment & Plan Note (Signed)
Attribute to patient's urinary tract infection and severe sepsis and dehydration. Will continue with MIVF and IV antibiotics and electrolyte correction and PT eval prior to discharge. Fall precautions and out of bed with assistance only.

## 2023-02-07 NOTE — Assessment & Plan Note (Signed)
Alcohol level. CIWA protocol if patient is not drinking or has any history of alcohol abuse. Will follow magnesium and potassium level.

## 2023-02-07 NOTE — H&P (Signed)
History and Physical    Chief Complaint: Generalized weakness   HISTORY OF PRESENT ILLNESS: Roger [name] is an 67 y.o. male seen today for generalized weakness for about a week per nurses report patient had a Foley removed about 2 days ago with plans for measuring PVR and following up with urology.  Patient has a history of metastatic prostate cancer and on arrival was noted to be febrile with a temp of 100.4.  Patient on initial presentation meets sepsis criteria and code sepsis was activated patient was started on cefepime 2 g  after blood cultures were collected. Urinalysis is ordered, initial lactic of 2.0.   Pt has  Past Medical History:  Diagnosis Date   Chest pain    a. 04/2016 MV: small, mild apical defect c/w apical ischemia, EF 55-65%-->Low risk study.   Diabetes mellitus without complication ([employer])    Gout    Hematochezia    History of echocardiogram    a. 04/2016 Echo: EF 55-60%, mild LVH, no rwma, midly dil LA.   History of heart attack    Hypertension    Morbid obesity ([place])    Prostate cancer ([place])    Thrombocytopenia ([place]) 09/28/2022     Review of Systems  Constitutional:  Positive for fever.  Neurological:  Positive for weakness.   Allergies  Allergen Reactions   Albuterol Nausea And Vomiting   Mucinex [Guaifenesin Er] Rash   Past Surgical History:  Procedure Laterality Date   COLONOSCOPY WITH PROPOFOL N/A 08/05/2018   Procedure: COLONOSCOPY WITH PROPOFOL;  Surgeon: Lin Landsman, MD;  Location: [facility];  Service: Gastroenterology;  Laterality: N/A;   thumb surgery         MEDICATIONS: Current Outpatient Medications  Medication Instructions   apalutamide (ERLEADA) 240 mg, Oral, Daily   isosorbide mononitrate (IMDUR) 30 MG 24 hr tablet TAKE 1 TABLET(30 MG) BY MOUTH DAILY   metFORMIN (GLUCOPHAGE) 500 mg, Oral, 2 times daily with meals   oxyCODONE (ROXICODONE) 5 mg, Oral, Every 6 hours PRN   predniSONE (DELTASONE) 5 mg, Oral, Daily  with breakfast   rosuvastatin (CRESTOR) 10 mg, Oral, Daily     apalutamide  240 mg Oral Daily   docusate sodium  100 mg Oral BID   insulin aspart  0-9 Units Subcutaneous TID WC   rosuvastatin  10 mg Oral Daily     lactated ringers 150 mL/hr at 02/07/23 1959   potassium chloride 10 mEq (02/07/23 2352)    acetaminophen **OR** acetaminophen, bisacodyl, ondansetron (ZOFRAN) IV, oxyCODONE, polyethylene glycol    ED Course: Pt in Ed patient is alert awake oriented afebrile with O2 sats above 99% on room air. Vitals:   02/08/23 0000 02/08/23 0015 02/08/23 0030 02/08/23 0033  BP: 107/73   119/76  Pulse: 90 90 88 88  Resp:    (!) 21  Temp:    99.3 F (37.4 C)  TempSrc:    Oral  SpO2: 99% 100% 100% 99%  Weight:      Height:       No intake/output data recorded. SpO2: 99 % Blood work in ed shows: Hyponatremia of 133 hypokalemia of 2.5 serum bicarb of 20 anion gap of 13 AKI of 1.38 alk phos of 165 improved from previous 650, albumin of 2.1 total protein 5.7 and total bili of 1.9. Initial lactic of 2 point 0 repeat lactic of 2.2. Leukocytosis of 20.4 hemoglobin of 7.2 MCV 107.9 RDW 16.2 and platelets of 241. PT of 16.1 INR of  1.3. Respiratory panel negative for flu RSV and COVID. Urinalysis shows nitrite positive urine with large leukocytes WBCs from 21-50 and few bacteria with yellow and hazy color urine. Blood and urine cultures collected in the emergency room. Chest x-ray shows lungs are clear however numerous sclerotic osseous lesions consistent with patient's metastatic prostate cancer history. EKG today is sinus rhythm at 92 with PR of 135 and RVH, QRS of 108 and prolonged QTc of 515. Results for orders placed or performed during the hospital encounter of 02/07/23 (from the past 24 hour(s))  Resp panel by RT-PCR (RSV, Flu A&B, Covid) Anterior Nasal Swab     Status: None   Collection Time: 02/07/23  7:17 PM   Specimen: Anterior Nasal Swab  Result Value Ref Range   SARS  Coronavirus 2 by RT PCR NEGATIVE NEGATIVE   Influenza A by PCR NEGATIVE NEGATIVE   Influenza B by PCR NEGATIVE NEGATIVE   Resp Syncytial Virus by PCR NEGATIVE NEGATIVE  Comprehensive metabolic panel     Status: Abnormal   Collection Time: 02/07/23  7:19 PM  Result Value Ref Range   Sodium 133 (L) 135 - 145 mmol/L   Potassium 2.5 (LL) 3.5 - 5.1 mmol/L   Chloride 100 98 - 111 mmol/L   CO2 20 (L) 22 - 32 mmol/L   Glucose, Bld 92 70 - 99 mg/dL   BUN 19 8 - 23 mg/dL   Creatinine, Ser 1.38 (H) 0.61 - 1.24 mg/dL   Calcium 6.9 (L) 8.9 - 10.3 mg/dL   Total Protein 5.7 (L) 6.5 - 8.1 g/dL   Albumin 2.1 (L) 3.5 - 5.0 g/dL   AST 18 15 - 41 U/L   ALT 6 0 - 44 U/L   Alkaline Phosphatase 165 (H) 38 - 126 U/L   Total Bilirubin 1.9 (H) 0.3 - 1.2 mg/dL   GFR, Estimated 57 (L) >60 mL/min   Anion gap 13 5 - 15  CBC with Differential     Status: Abnormal   Collection Time: 02/07/23  7:19 PM  Result Value Ref Range   WBC 20.4 (H) 4.0 - 10.5 K/uL   RBC 2.29 (L) 4.22 - 5.81 MIL/uL   Hemoglobin 7.2 (L) 13.0 - 17.0 g/dL   HCT 24.7 (L) 39.0 - 52.0 %   MCV 107.9 (H) 80.0 - 100.0 fL   MCH 31.4 26.0 - 34.0 pg   MCHC 29.1 (L) 30.0 - 36.0 g/dL   RDW 16.2 (H) 11.5 - 15.5 %   Platelets 241 150 - 400 K/uL   nRBC 0.0 0.0 - 0.2 %   Neutrophils Relative % 84 %   Neutro Abs 17.0 (H) 1.7 - 7.7 K/uL   Lymphocytes Relative 9 %   Lymphs Abs 1.9 0.7 - 4.0 K/uL   Monocytes Relative 3 %   Monocytes Absolute 0.5 0.1 - 1.0 K/uL   Eosinophils Relative 0 %   Eosinophils Absolute 0.1 0.0 - 0.5 K/uL   Basophils Relative 0 %   Basophils Absolute 0.1 0.0 - 0.1 K/uL   Immature Granulocytes 4 %   Abs Immature Granulocytes 0.86 (H) 0.00 - 0.07 K/uL  Lactic acid, plasma     Status: Abnormal   Collection Time: 02/07/23  7:39 PM  Result Value Ref Range   Lactic Acid, Venous 2.0 (HH) 0.5 - 1.9 mmol/L  Urinalysis, w/ Reflex to Culture (Infection Suspected) -Urine, Clean Catch     Status: Abnormal   Collection Time: 02/07/23   9:11 PM  Result Value Ref  Range   Specimen Source URINE, RANDOM    Color, Urine YELLOW (A) YELLOW   APPearance HAZY (A) CLEAR   Specific Gravity, Urine 1.013 1.005 - 1.030   pH 5.0 5.0 - 8.0   Glucose, UA NEGATIVE NEGATIVE mg/dL   Hgb urine dipstick NEGATIVE NEGATIVE   Bilirubin Urine NEGATIVE NEGATIVE   Ketones, ur NEGATIVE NEGATIVE mg/dL   Protein, ur NEGATIVE NEGATIVE mg/dL   Nitrite POSITIVE (A) NEGATIVE   Leukocytes,Ua LARGE (A) NEGATIVE   RBC / HPF 0-5 0 - 5 RBC/hpf   WBC, UA 21-50 0 - 5 WBC/hpf   Bacteria, UA FEW (A) NONE SEEN   Squamous Epithelial / HPF 0-5 0 - 5 /HPF   Mucus PRESENT   Protime-INR     Status: Abnormal   Collection Time: 02/07/23  9:11 PM  Result Value Ref Range   Prothrombin Time 16.1 (H) 11.4 - 15.2 seconds   INR 1.3 (H) 0.8 - 1.2  APTT     Status: None   Collection Time: 02/07/23  9:11 PM  Result Value Ref Range   aPTT 35 24 - 36 seconds  Lactic acid, plasma     Status: Abnormal   Collection Time: 02/07/23 10:18 PM  Result Value Ref Range   Lactic Acid, Venous 2.2 (HH) 0.5 - 1.9 mmol/L  Type and screen [facility]     Status: None   Collection Time: 02/07/23 10:18 PM  Result Value Ref Range   ABO/RH(D) O POS    Antibody Screen NEG    Sample Expiration      02/10/2023,2359 Performed at [facility], [address] [place], [place] [address]   Magnesium     Status: Abnormal   Collection Time: 02/08/23 12:16 AM  Result Value Ref Range   Magnesium 1.2 (L) 1.7 - 2.4 mg/dL  Hemoglobin and hematocrit, blood     Status: Abnormal   Collection Time: 02/08/23 12:16 AM  Result Value Ref Range   Hemoglobin 6.0 (L) 13.0 - 17.0 g/dL   HCT 19.1 (L) 39.0 - 52.0 %    Unresulted Labs (From admission, onward)     Start     Ordered   02/08/23 0500  Phosphorus  Tomorrow morning,   STAT        02/07/23 2350   02/08/23 0500  Magnesium  Tomorrow morning,   STAT        02/07/23 2350   02/08/23 0500  CBC with  Differential/Platelet  Tomorrow morning,   STAT        02/07/23 2350   02/08/23 0500  Comprehensive metabolic panel  Tomorrow morning,   STAT        02/07/23 2350   02/08/23 0053  Gastrointestinal Panel by PCR , Stool  (Gastrointestinal Panel by PCR, Stool                                                                                                                                                     **  Does Not include CLOSTRIDIUM DIFFICILE testing. **If CDIFF testing is needed, place order from the "C Difficile Testing" order set.**)  Once,   R        02/08/23 0052   02/08/23 0053  C Difficile Quick Screen w PCR reflex  (C Difficile quick screen w PCR reflex panel )  Once, for 24 hours,   R       References:    CDiff Information Tool   02/08/23 0052   02/07/23 2344  Ethanol  Add-on,   AD        02/07/23 2343   02/07/23 2111  Urine Culture  Once,   R        02/07/23 2111   02/07/23 1906  Blood Culture (routine x 2)  (Septic presentation on arrival (screening labs, nursing and treatment orders for obvious sepsis))  BLOOD CULTURE X 2,   STAT      02/07/23 1907   Unscheduled  Occult blood card to lab, stool  As needed,   URGENT      02/07/23 2350           Pt has received : Orders Placed This Encounter  Procedures   1-3 Lead EKG Interpretation    This order was created via procedure documentation    Standing Status:   Standing    Number of Occurrences:   1   Resp panel by RT-PCR (RSV, Flu A&B, Covid) Anterior Nasal Swab    Standing Status:   Standing    Number of Occurrences:   1   Blood Culture (routine x 2)    Standing Status:   Standing    Number of Occurrences:   2   Urine Culture    Standing Status:   Standing    Number of Occurrences:   1   Gastrointestinal Panel by PCR , Stool    Standing Status:   Standing    Number of Occurrences:   1   C Difficile Quick Screen w PCR reflex    Standing Status:   Standing    Number of Occurrences:   1   DG Chest Port 1 View     Standing Status:   Standing    Number of Occurrences:   1    Order Specific Question:   Reason for Exam (SYMPTOM  OR DIAGNOSIS REQUIRED)    Answer:   Questionable sepsis - evaluate for abnormality   Lactic acid, plasma    Standing Status:   Standing    Number of Occurrences:   2   Comprehensive metabolic panel    Standing Status:   Standing    Number of Occurrences:   1   CBC with Differential    Standing Status:   Standing    Number of Occurrences:   1   Urinalysis, w/ Reflex to Culture (Infection Suspected) -Urine, Clean Catch    Standing Status:   Standing    Number of Occurrences:   1    Order Specific Question:   Specimen Source    Answer:   Urine, Clean Catch [76]   Protime-INR    Standing Status:   Standing    Number of Occurrences:   1   APTT    Standing Status:   Standing    Number of Occurrences:   1   Ethanol    Standing Status:   Standing    Number of Occurrences:   1   Magnesium    Standing Status:  Standing    Number of Occurrences:   1   Phosphorus    Standing Status:   Standing    Number of Occurrences:   1   Magnesium    Standing Status:   Standing    Number of Occurrences:   1   CBC with Differential/Platelet    Standing Status:   Standing    Number of Occurrences:   1   Comprehensive metabolic panel    Standing Status:   Standing    Number of Occurrences:   1   Occult blood card to lab, stool    Standing Status:   Standing    Number of Occurrences:   3   Hemoglobin and hematocrit, blood    Standing Status:   Standing    Number of Occurrences:   1   Diet Carb Modified Fluid consistency: Thin; Room service appropriate? Yes    Standing Status:   Standing    Number of Occurrences:   1    Order Specific Question:   Diet-HS Snack?    Answer:   Nothing    Order Specific Question:   Calorie Level    Answer:   Medium 1600-2000    Order Specific Question:   Fluid consistency:    Answer:   Thin    Order Specific Question:   Room service appropriate?     Answer:   Yes   Cardiac monitoring    Standing Status:   Standing    Number of Occurrences:   1   Document height and weight    Standing Status:   Standing    Number of Occurrences:   1   Assess and Document Glasgow Coma Scale    Standing Status:   Standing    Number of Occurrences:   1   Document vital signs within 1-hour of fluid bolus completion. Notify provider of abnormal vital signs despite fluid resuscitation.    Standing Status:   Standing    Number of Occurrences:   1   DO NOT delay antibiotics if unable to obtain blood culture.    Standing Status:   Standing    Number of Occurrences:   1   Refer to Sidebar Report: Sepsis Sidebar ED/IP    Sepsis Sidebar ED/IP    Standing Status:   Standing    Number of Occurrences:   1   Notify provider for difficulties obtaining IV access.    Standing Status:   Standing    Number of Occurrences:   1   Insert peripheral IV x 2    Angiocath size 20G or larger    Standing Status:   Standing    Number of Occurrences:   1   Initiate Carrier Fluid Protocol    Standing Status:   Standing    Number of Occurrences:   1   In and Out Cath    Standing Status:   Standing    Number of Occurrences:   1   Insert urethral catheter    Standing Status:   Standing    Number of Occurrences:   1    Order Specific Question:   Catheter type    Answer:   foley    Order Specific Question:   Reason for Insertion    Answer:   Bladder outlet obstruction / other urologic reason    Order Specific Question:   Reason for Insertion    Answer:   Chronic catheter use    Order  Specific Question:   Upon removal of foley, follow the Post Indwelling Urinary Catheter Retention Guidelines if patient meets criteria    Answer:   Yes   Vital signs    Standing Status:   Standing    Number of Occurrences:   1   Notify physician (specify)    Standing Status:   Standing    Number of Occurrences:   20    Order Specific Question:   Notify Physician    Answer:   for  pulse less than 55 or greater than 120    Order Specific Question:   Notify Physician    Answer:   for respiratory rate less than 12 or greater than 25    Order Specific Question:   Notify Physician    Answer:   for temperature greater than 100.5 F    Order Specific Question:   Notify Physician    Answer:   for urinary output less than 30 mL/hr for four hours    Order Specific Question:   Notify Physician    Answer:   for systolic BP less than 90 or greater than 0000000, diastolic BP less than 60 or greater than 100    Order Specific Question:   Notify Physician    Answer:   for new hypoxia w/ oxygen saturations < 88%   Progressive Mobility Protocol: No Restrictions    Standing Status:   Standing    Number of Occurrences:   1   If lactate (lactic acid) >2, verify repeat lactic acid order has been placed to be drawn    Standing Status:   Standing    Number of Occurrences:   1   Document vital signs within 1-hour of fluid bolus completion and notify provider of bolus completion    Standing Status:   Standing    Number of Occurrences:   1   Vital signs    Standing Status:   Standing    Number of Occurrences:   1   Vital signs    Standing Status:   Standing    Number of Occurrences:   1   Notify physician (specify) If patient in A-Fib, change in heart rhythm, or HR > 125 beat/min    If patient in A-Fib, change in heart rhythm, or HR > 125 beat/min    Standing Status:   Standing    Number of Occurrences:   1   Apply Sepsis Care Plan    Standing Status:   Standing    Number of Occurrences:   1   Refer to Sidebar Report: Sepsis Bundle ED/IP    Sepsis Bundle ED/IP    Standing Status:   Standing    Number of Occurrences:   1   Assess and Document Glasgow Coma Scale    Standing Status:   Standing    Number of Occurrences:   1   Initiate Oral Care Protocol    Standing Status:   Standing    Number of Occurrences:   1   Initiate Carrier Fluid Protocol    Standing Status:   Standing     Number of Occurrences:   1   RN may order General Admission PRN Orders utilizing "General Admission PRN medications" (through manage orders) for the following patient needs: allergy symptoms (Claritin), cold sores (Carmex), cough (Robitussin DM), eye irritation (Liquifilm Tears), hemorrhoids (Tucks), indigestion (Maalox), minor skin irritation (Hydrocortisone Cream), muscle pain Suezanne Jacquet Gay), nose irritation (saline nasal spray) and sore throat (Chloraseptic spray).  Standing Status:   Standing    Number of Occurrences:   [phone]   Apply Diabetes Mellitus Care Plan    Standing Status:   Standing    Number of Occurrences:   1   STAT CBG when hypoglycemia is suspected. If treated, recheck every 15 minutes after each treatment until CBG >/= 70 mg/dl    Standing Status:   Standing    Number of Occurrences:   1   Refer to Hypoglycemia Protocol Sidebar Report for treatment of CBG < 70 mg/dl    Standing Status:   Standing    Number of Occurrences:   1   No HS correction Insulin    Standing Status:   Standing    Number of Occurrences:   1   Full code    Standing Status:   Standing    Number of Occurrences:   1    Order Specific Question:   By:    Answer:   Other   Code Sepsis activation.  This occurs automatically when order is signed and prioritizes pharmacy, lab, and radiology services for STAT collections and interventions.  If [facility] downtime, call [facility] [phone]) to activate Code Sepsis.    Standing Status:   Standing    Number of Occurrences:   1    Order Specific Question:   Initiate "Code Sepsis" tracking    Answer:   Yes    Order Specific Question:   Contact [facility] ([phone])    Answer:   No    Order Specific Question:   Reason for Consult?    Answer:   tracking   Consult to hospitalist    Standing Status:   Standing    Number of Occurrences:   1    Order Specific Question:   Place call to:    Answer:   [id number]    Order Specific Question:   Reason for Consult    Answer:    [employer] Consult    Standing Status:   Standing    Number of Occurrences:   1    Order Specific Question:   Reason for Consult (*indicate specifics in comment field)    Answer:   Other (see comment)    Order Specific Question:   Comment:    Answer:   Treatment for SUSPECTED SEPSIS has been initiated. Follow patient to expedite delivery of core sepsis measures, specifically timely, antibiotic administration.   ceFEPime (MAXIPIME) per pharmacy consult             Standing Status:   Standing    Number of Occurrences:   1    Order Specific Question:   Antibiotic Indication:    Answer:   UTI    Order Specific Question:   Duration of therapy in days:    Answer:   7   Enteric precautions (UV disinfection)    Standing Status:   Standing    Number of Occurrences:   1   Pulse oximetry, continuous    Standing Status:   Standing    Number of Occurrences:   1   Pulse oximetry check with vital signs    Standing Status:   Standing    Number of Occurrences:   1   Pulse oximetry (single)    Standing Status:   Standing    Number of Occurrences:   [id number]   Oxygen therapy Mode or (Route): Nasal cannula; Liters Per Minute: 2; Keep 02 saturation: greater than 92 %  Standing Status:   Standing    Number of Occurrences:   20    Order Specific Question:   Mode or (Route)    Answer:   Nasal cannula    Order Specific Question:   Liters Per Minute    Answer:   2    Order Specific Question:   Keep 02 saturation    Answer:   greater than 92 %   ED EKG 12-Lead    Standing Status:   Standing    Number of Occurrences:   1    Order Specific Question:   Notes    Answer:   Baseline   Type and screen [place]     Standing Status:   Standing    Number of Occurrences:   1   Admit to Inpatient (patient's expected length of stay will be greater than 2 midnights or inpatient only procedure)    Standing Status:   Standing    Number of  Occurrences:   1    Order Specific Question:   Hospital Area    Answer:   [place] [[id number]]    Order Specific Question:   Level of Care    Answer:   Telemetry Medical [104]    Order Specific Question:   Covid Evaluation    Answer:   Asymptomatic - no recent exposure (last 10 days) testing not required    Order Specific Question:   Diagnosis    Answer:   Generalized weakness [id number]    Order Specific Question:   Admitting Physician    Answer:   Cherylann Ratel    Order Specific Question:   Attending Physician    Answer:   Cherylann Ratel    Order Specific Question:   Certification:    Answer:   I certify this patient will need inpatient services for at least 2 midnights    Order Specific Question:   Estimated Length of Stay    Answer:   2   Aspiration precautions    Standing Status:   Standing    Number of Occurrences:   1   Fall precautions    Standing Status:   Standing    Number of Occurrences:   1    Meds ordered this encounter  Medications   lactated ringers infusion   sodium chloride 0.9 % bolus 2,000 mL    Already had 1376mL NS via EMS    Order Specific Question:   Reason 30 mL/kg dose is not being ordered    Answer:   First Lactic Acid Pending   ceFEPIme (MAXIPIME) 2 g in sodium chloride 0.9 % 100 mL IVPB    Order Specific Question:   Antibiotic Indication:    Answer:   UTI   potassium chloride 10 mEq in 100 mL IVPB   potassium chloride (KLOR-CON) packet 40 mEq   ondansetron (ZOFRAN) injection 4 mg   apalutamide (ERLEADA) tablet 240 mg   oxyCODONE (Oxy IR/ROXICODONE) immediate release tablet 5 mg   rosuvastatin (CRESTOR) tablet 10 mg   OR Linked Order Group    acetaminophen (TYLENOL) tablet 650 mg    acetaminophen (TYLENOL) suppository 650 mg   docusate sodium (COLACE) capsule 100 mg   polyethylene glycol (MIRALAX / GLYCOLAX) packet 17 g   bisacodyl (DULCOLAX) EC tablet 5 mg   insulin aspart (novoLOG) injection 0-9 Units     Order Specific Question:  Correction coverage:    Answer:   Sensitive (thin, NPO, renal)    Order Specific Question:   CBG < 70:    Answer:   Implement Hypoglycemia Standing Orders and refer to Hypoglycemia Standing Orders sidebar report    Order Specific Question:   CBG 70 - 120:    Answer:   0 units    Order Specific Question:   CBG 121 - 150:    Answer:   1 unit    Order Specific Question:   CBG 151 - 200:    Answer:   2 units    Order Specific Question:   CBG 201 - 250:    Answer:   3 units    Order Specific Question:   CBG 251 - 300:    Answer:   5 units    Order Specific Question:   CBG 301 - 350:    Answer:   7 units    Order Specific Question:   CBG 351 - 400    Answer:   9 units    Order Specific Question:   CBG > 400    Answer:   call MD and obtain STAT lab verification     Admission Imaging : DG Chest Port 1 View  Result Date: 02/07/2023 CLINICAL DATA:  Questionable sepsis EXAM: PORTABLE CHEST 1 VIEW COMPARISON:  Chest x-ray dated October 17, 2022 FINDINGS: The heart size and mediastinal contours are within normal limits. Both lungs are clear. Numerous sclerotic osseous lesions, consistent with patient history of metastatic prostate cancer. IMPRESSION: 1. Lungs are clear. 2. Numerous sclerotic osseous lesions, consistent with patient history of metastatic prostate cancer. Electronically Signed   By: Yetta Glassman M.D.   On: 02/07/2023 19:32    Physical Examination: Vitals:   02/08/23 0000 02/08/23 0015 02/08/23 0030 02/08/23 0033  BP: 107/73   119/76  Pulse: 90 90 88 88  Temp:    99.3 F (37.4 C)  Resp:    (!) 21  Height:      Weight:      SpO2: 99% 100% 100% 99%  TempSrc:    Oral  BMI (Calculated):       Physical Exam Vitals and nursing note reviewed.  Constitutional:      General: He is not in acute distress.    Appearance: Normal appearance. He is not ill-appearing, toxic-appearing or diaphoretic.  HENT:     Head: Normocephalic and atraumatic.      Right Ear: Hearing and external ear normal.     Left Ear: Hearing and external ear normal.     Nose: Nose normal. No nasal deformity.     Mouth/Throat:     Lips: Pink.     Mouth: Mucous membranes are moist.     Tongue: No lesions.     Pharynx: Oropharynx is clear.  Eyes:     Extraocular Movements: Extraocular movements intact.  Cardiovascular:     Rate and Rhythm: Normal rate and regular rhythm.     Pulses: Normal pulses.     Heart sounds: Normal heart sounds.  Pulmonary:     Effort: Pulmonary effort is normal.     Breath sounds: Normal breath sounds.  Abdominal:     General: Bowel sounds are normal. There is no distension.     Palpations: Abdomen is soft. There is no mass.     Tenderness: There is no abdominal tenderness. There is no guarding.     Hernia: No hernia is present.  Musculoskeletal:  Right lower leg: No edema.     Left lower leg: No edema.  Skin:    General: Skin is warm.  Neurological:     General: No focal deficit present.     Mental Status: He is alert and oriented to person, place, and time.     Cranial Nerves: Cranial nerves 2-12 are intact.     Motor: Motor function is intact.  Psychiatric:        Attention and Perception: Attention normal.        Mood and Affect: Mood normal.        Speech: Speech normal.        Behavior: Behavior normal. Behavior is cooperative.        Cognition and Memory: Cognition normal.       Assessment and Plan: * Generalized weakness Attribute to patient's urinary tract infection and severe sepsis and dehydration. Will continue with MIVF and IV antibiotics and electrolyte correction and PT eval prior to discharge. Fall precautions and out of bed with assistance only.  Folate deficiency CBC shows macrocytosis differentials include from folate deficiency versus question alcohol abuse, question if patient has any thyroid issues we will evaluate and follow .   Alcohol use Alcohol level. CIWA protocol if patient is not  drinking or has any history of alcohol abuse. Will follow magnesium and potassium level.   Severe sepsis secondary to UTI [facility]) Patient meets severe sepsis criteria secondary to urinary tract infection. Will continue with IV antibiotic regimen and IV fluids. Additional supportive care with antiemetics and antipyretics. Follow culture sensitivity tailor antibiotics to p.o. regimen.   AKI (acute kidney injury) [facility]) Lab Results  Component Value Date   CREATININE 1.38 (H) 02/07/2023   CREATININE 1.00 11/15/2022   CREATININE 1.03 10/17/2022  Secondary to postobstructive etiology with patient's prostate cancer. Will place an indwelling Foley to further alleviate any obstructive symptoms. Monitor creatinine and avoid contrast studies and renally dose all needed medications.    Indwelling Foley catheter present Will place an indwelling Foley. Strict I's and O's.   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 Will continue patient on home regimen of Erleada.  Anemia, chronic disease    Latest Ref Rng & Units 02/07/2023    7:19 PM 11/15/2022    1:22 PM 10/31/2022   10:05 AM  CBC  WBC 4.0 - 10.5 K/uL 20.4  13.4  14.6   Hemoglobin 13.0 - 17.0 g/dL 7.2  9.0  9.4   Hematocrit 39.0 - 52.0 % 24.7  27.6  29.5   Platelets 150 - 400 K/uL 241  293  300   Anemia is worsening from his December blood work. - type and screen, IV PPI, follow CBC suspect patient will need 1 unit. Stool occult. -repeat h/h pending.    Essential hypertension Vitals:   02/07/23 1900 02/07/23 1908 02/07/23 1930 02/07/23 2000  BP: 121/76 121/76 100/75 109/73   02/07/23 2030 02/07/23 2100 02/07/23 2115 02/07/23 2200  BP: 103/78 110/68 107/75 (!) 126/93   02/07/23 2230 02/07/23 2300 02/07/23 2309  BP: 126/79 119/78 119/78  Home regimen of Imdur and lisinopril. Med rec verification is pending, will hold both medications as blood pressure is low and patient has  AKI.   Diabetes mellitus without complication (HCC) Glycemic protocol, consistent carb diet as tolerated. Metformin held due to AKI. Last A1c was 4 months ago at 6.5.    Diarrhea Pt reports diarrhea since one week. D/d include GIB vs  infectious.  We will obtain GI panel and c.diff and stool guaiac .      DVT prophylaxis:  SCD's.   Code Status:  Full code.   Family Communication:  Amparo Costella; [phone].   Disposition Plan:  Home.   Consults called:  None.  Admission status: Inpatient.    Unit/ Expected LOS: Med tele/ 2 days.    Para Skeans MD [facility]  6 PM- 2 AM. Please contact me via secure Chat 6 PM-2 AM. [phone]( Pager ) To contact the [facility] Attending or Consulting provider [place] or covering provider during after hours [place], for this patient.   Check the care team in [facility] and look for a) attending/consulting [facility] provider listed and b) the [facility] team listed Log into [email or web address] and use [place]'s universal password to access. If you do not have the password, please contact the hospital operator. Locate the [facility] provider you are looking for under [facility] and page to a number that you can be directly reached. If you still have difficulty reaching the provider, please page the [facility] (Director on Call) for the Hospitalists listed on amion for assistance. [email or web address] 02/08/2023, 12:53 AM

## 2023-02-07 NOTE — Assessment & Plan Note (Signed)
Will place an indwelling Foley. Strict I's and O's.

## 2023-02-07 NOTE — Assessment & Plan Note (Signed)
Vitals:   02/07/23 1900 02/07/23 1908 02/07/23 1930 02/07/23 2000  BP: 121/76 121/76 100/75 109/73   02/07/23 2030 02/07/23 2100 02/07/23 2115 02/07/23 2200  BP: 103/78 110/68 107/75 (!) 126/93   02/07/23 2230 02/07/23 2300 02/07/23 2309  BP: 126/79 119/78 119/78  Home regimen of Imdur and lisinopril. Med rec verification is pending, will hold both medications as blood pressure is low and patient has AKI.

## 2023-02-07 NOTE — Consult Note (Signed)
PHARMACY -  BRIEF ANTIBIOTIC NOTE   Pharmacy has received consult(s) for cefepime from an ED provider.  The patient's profile has been reviewed for ht/wt/allergies/indication/available labs.    One time order(s) placed for  Cefepime 2 gram  Further antibiotics/pharmacy consults should be ordered by admitting physician if indicated.                       Thank you, Dorothe Pea, PharmD, BCPS Clinical Pharmacist   02/07/2023  7:17 PM

## 2023-02-07 NOTE — ED Notes (Signed)
UA when able.  Male purwick applied to low continuous wall suction

## 2023-02-07 NOTE — Consult Note (Signed)
CODE SEPSIS - PHARMACY COMMUNICATION  **Broad Spectrum Antibiotics should be administered within 1 hour of Sepsis diagnosis**  Time Code Sepsis Called/Page Received: 1911  Antibiotics Ordered: cefepime  Time of 1st antibiotic administration: 1956  Additional action taken by pharmacy: n/a  If necessary, Name of Provider/Nurse Contacted: n/a    Dorothe Pea ,PharmD Clinical Pharmacist  02/07/2023  7:16 PM

## 2023-02-07 NOTE — ED Notes (Signed)
Provider notified pt lactic 2.0

## 2023-02-07 NOTE — ED Notes (Signed)
Antibiotics not delayed per policy due to second collection of blood cultures.  First set of blood cultures colleted from L arm and sent to prior to administration of antibiotic.

## 2023-02-07 NOTE — ED Notes (Signed)
Provider notified pt lactic acid: 2.2

## 2023-02-07 NOTE — Assessment & Plan Note (Addendum)
Glycemic protocol, consistent carb diet as tolerated. Metformin held due to AKI. Last A1c was 4 months ago at 6.5.

## 2023-02-08 ENCOUNTER — Telehealth (HOSPITAL_COMMUNITY): Payer: Self-pay

## 2023-02-08 ENCOUNTER — Other Ambulatory Visit (HOSPITAL_COMMUNITY): Payer: Self-pay

## 2023-02-08 DIAGNOSIS — R197 Diarrhea, unspecified: Secondary | ICD-10-CM | POA: Diagnosis present

## 2023-02-08 DIAGNOSIS — R531 Weakness: Secondary | ICD-10-CM | POA: Diagnosis not present

## 2023-02-08 LAB — CBC WITH DIFFERENTIAL/PLATELET
Abs Immature Granulocytes: 1.25 10*3/uL — ABNORMAL HIGH (ref 0.00–0.07)
Basophils Absolute: 0.1 10*3/uL (ref 0.0–0.1)
Basophils Relative: 0 %
Eosinophils Absolute: 0.1 10*3/uL (ref 0.0–0.5)
Eosinophils Relative: 1 %
HCT: 17 % — ABNORMAL LOW (ref 39.0–52.0)
Hemoglobin: 5.7 g/dL — ABNORMAL LOW (ref 13.0–17.0)
Immature Granulocytes: 6 %
Lymphocytes Relative: 8 %
Lymphs Abs: 1.8 10*3/uL (ref 0.7–4.0)
MCH: 32.6 pg (ref 26.0–34.0)
MCHC: 33.5 g/dL (ref 30.0–36.0)
MCV: 97.1 fL (ref 80.0–100.0)
Monocytes Absolute: 0.6 10*3/uL (ref 0.1–1.0)
Monocytes Relative: 3 %
Neutro Abs: 17.6 10*3/uL — ABNORMAL HIGH (ref 1.7–7.7)
Neutrophils Relative %: 82 %
Platelets: 194 10*3/uL (ref 150–400)
RBC: 1.75 MIL/uL — ABNORMAL LOW (ref 4.22–5.81)
RDW: 15.9 % — ABNORMAL HIGH (ref 11.5–15.5)
Smear Review: NORMAL
WBC: 21.5 10*3/uL — ABNORMAL HIGH (ref 4.0–10.5)
nRBC: 0 % (ref 0.0–0.2)

## 2023-02-08 LAB — GASTROINTESTINAL PANEL BY PCR, STOOL (REPLACES STOOL CULTURE)

## 2023-02-08 LAB — COMPREHENSIVE METABOLIC PANEL
ALT: 5 U/L (ref 0–44)
AST: 11 U/L — ABNORMAL LOW (ref 15–41)
Albumin: 1.6 g/dL — ABNORMAL LOW (ref 3.5–5.0)
Alkaline Phosphatase: 128 U/L — ABNORMAL HIGH (ref 38–126)
Anion gap: 8 (ref 5–15)
BUN: 17 mg/dL (ref 8–23)
CO2: 20 mmol/L — ABNORMAL LOW (ref 22–32)
Calcium: 6.5 mg/dL — ABNORMAL LOW (ref 8.9–10.3)
Chloride: 106 mmol/L (ref 98–111)
Creatinine, Ser: 1.02 mg/dL (ref 0.61–1.24)
GFR, Estimated: >60 mL/min (ref >60)
Glucose, Bld: 86 mg/dL (ref 70–99)
Potassium: 2.7 mmol/L — CL (ref 3.5–5.1)
Sodium: 134 mmol/L — ABNORMAL LOW (ref 135–145)
Total Bilirubin: 1.4 mg/dL — ABNORMAL HIGH (ref 0.3–1.2)
Total Protein: 4.7 g/dL — ABNORMAL LOW (ref 6.5–8.1)

## 2023-02-08 LAB — TYPE AND SCREEN

## 2023-02-08 LAB — PREPARE RBC (CROSSMATCH)

## 2023-02-08 LAB — GLUCOSE, CAPILLARY
Glucose-Capillary: 88 mg/dL (ref 70–99)
Glucose-Capillary: 79 mg/dL (ref 70–99)
Glucose-Capillary: 80 mg/dL (ref 70–99)
Glucose-Capillary: 83 mg/dL (ref 70–99)

## 2023-02-08 LAB — MAGNESIUM
Magnesium: 1.2 mg/dL — ABNORMAL LOW (ref 1.7–2.4)
Magnesium: 1.3 mg/dL — ABNORMAL LOW (ref 1.7–2.4)

## 2023-02-08 LAB — CLOSTRIDIUM DIFFICILE BY PCR, REFLEXED: Toxigenic C. Difficile by PCR: POSITIVE — AB

## 2023-02-08 LAB — C DIFFICILE QUICK SCREEN W PCR REFLEX
C Diff antigen: POSITIVE — AB
C Diff toxin: NEGATIVE

## 2023-02-08 LAB — HEMOGLOBIN AND HEMATOCRIT, BLOOD
HCT: 19.1 % — ABNORMAL LOW (ref 39.0–52.0)
Hemoglobin: 6 g/dL — ABNORMAL LOW (ref 13.0–17.0)

## 2023-02-08 LAB — PHOSPHORUS: Phosphorus: 2.3 mg/dL — ABNORMAL LOW (ref 2.5–4.6)

## 2023-02-08 LAB — ETHANOL: Alcohol, Ethyl (B): <10 mg/dL (ref <10)

## 2023-02-08 LAB — OCCULT BLOOD X 1 CARD TO LAB, STOOL: Fecal Occult Bld: NEGATIVE

## 2023-02-08 MED ORDER — SODIUM CHLORIDE 0.9% IV SOLUTION: Freq: Once | INTRAVENOUS | Status: AC

## 2023-02-08 MED ORDER — MAGNESIUM SULFATE 4 GM/100ML IV SOLN
4.0000 g | Freq: Once | INTRAVENOUS | Status: AC
  Administered 2023-02-08: 4 g via INTRAVENOUS
  Filled 2023-02-08 (×2): qty 100

## 2023-02-08 MED ORDER — ACETAMINOPHEN 325 MG PO TABS
650.0000 mg | ORAL_TABLET | Freq: Four times a day (QID) | ORAL | Status: DC | PRN
  Administered 2023-02-10: 650 mg via ORAL
  Filled 2023-02-08: qty 2

## 2023-02-08 MED ORDER — MAGNESIUM SULFATE 2 GM/50ML IV SOLN: 2.0000 g | Freq: Once | INTRAVENOUS | Status: DC

## 2023-02-08 MED ORDER — POTASSIUM CHLORIDE 10 MEQ/100ML IV SOLN
10.0000 meq | INTRAVENOUS | Status: AC
  Administered 2023-02-08 (×4): 10 meq via INTRAVENOUS
  Filled 2023-02-08 (×4): qty 100

## 2023-02-08 MED ORDER — SODIUM CHLORIDE 0.9 % IV SOLN
2.0000 g | Freq: Two times a day (BID) | INTRAVENOUS | Status: DC
  Filled 2023-02-08 (×2): qty 12.5

## 2023-02-08 MED ORDER — VANCOMYCIN HCL 1750 MG/350ML IV SOLN
1750.0000 mg | Freq: Once | INTRAVENOUS | Status: AC
  Administered 2023-02-08: 1750 mg via INTRAVENOUS
  Filled 2023-02-08 (×2): qty 350

## 2023-02-08 MED ORDER — ACETAMINOPHEN 650 MG RE SUPP: 650.0000 mg | Freq: Four times a day (QID) | RECTAL | Status: DC | PRN

## 2023-02-08 MED ORDER — ADULT MULTIVITAMIN W/MINERALS CH
1.0000 | ORAL_TABLET | Freq: Every day | ORAL | Status: DC
  Administered 2023-02-08 – 2023-02-11 (×4): 1 via ORAL
  Filled 2023-02-08 (×4): qty 1

## 2023-02-08 MED ORDER — SODIUM CHLORIDE 0.9 % IV SOLN
2.0000 g | Freq: Three times a day (TID) | INTRAVENOUS | Status: DC
  Administered 2023-02-08 – 2023-02-10 (×5): 2 g via INTRAVENOUS
  Filled 2023-02-08: qty 12.5
  Filled 2023-02-08 (×3): qty 2
  Filled 2023-02-08 (×2): qty 12.5

## 2023-02-08 MED ORDER — VANCOMYCIN HCL 125 MG PO CAPS
125.0000 mg | ORAL_CAPSULE | Freq: Four times a day (QID) | ORAL | Status: DC
  Administered 2023-02-08 – 2023-02-09 (×6): 125 mg via ORAL
  Filled 2023-02-08 (×9): qty 1

## 2023-02-08 MED ORDER — VANCOMYCIN HCL 1500 MG/300ML IV SOLN
1500.0000 mg | INTRAVENOUS | Status: DC
  Administered 2023-02-09: 1500 mg via INTRAVENOUS
  Filled 2023-02-08: qty 300

## 2023-02-08 MED ORDER — ENSURE ENLIVE PO LIQD
237.0000 mL | Freq: Three times a day (TID) | ORAL | Status: DC
  Administered 2023-02-08: 237 mL via ORAL

## 2023-02-08 MED ORDER — POTASSIUM CHLORIDE CRYS ER 20 MEQ PO TBCR
40.0000 meq | EXTENDED_RELEASE_TABLET | Freq: Once | ORAL | Status: AC
  Administered 2023-02-08: 40 meq via ORAL
  Filled 2023-02-08: qty 2

## 2023-02-08 MED ORDER — POTASSIUM CHLORIDE 20 MEQ PO PACK
40.0000 meq | PACK | Freq: Once | ORAL | Status: AC
  Administered 2023-02-08: 40 meq via ORAL
  Filled 2023-02-08: qty 2

## 2023-02-08 NOTE — TOC Benefit Eligibility Note (Signed)
Patient Teacher, English as a foreign language completed.    The patient is currently admitted and upon discharge could be taking Vancomycin.  The current 30 day co-pay is $0.00.   The patient is currently admitted and upon discharge could be taking Vancomycin.  The current 30 day co-pay is [place] REQUIRED.   The patient is insured through [employer]

## 2023-02-08 NOTE — Progress Notes (Signed)
This writer went to the lab to get the 2nd unit of blood. Per lab personnel they do not see an order for 2nd unit and requested to enter a new order.  Dr. Shelly Coss made aware and will order 2nd unit.

## 2023-02-08 NOTE — Assessment & Plan Note (Signed)
Pt reports diarrhea since one week. D/d include GIB vs infectious.  We will obtain GI panel and c.diff and stool guaiac .

## 2023-02-08 NOTE — Progress Notes (Signed)
Initial Nutrition Assessment  DOCUMENTATION CODES:   Non-severe (moderate) malnutrition in context of chronic illness  INTERVENTION:   -Ensure Enlive po TID, each supplement provides 350 kcal and 20 grams of protein -MVI with minerals daily -Magic cup TID with meals, each supplement provides 290 kcal and 9 grams of protein  -Downgrade diet to dysphagia 3 for ease of intake  NUTRITION DIAGNOSIS:   Moderate Malnutrition related to chronic illness (metastatic prostate cancer) as evidenced by percent weight loss, mild fat depletion, moderate fat depletion, mild muscle depletion, moderate muscle depletion.  GOAL:   Patient will meet greater than or equal to 90% of their needs  MONITOR:   PO intake, Supplement acceptance  REASON FOR ASSESSMENT:   Malnutrition Screening Tool    ASSESSMENT:   Pt with PMH significant for diabetes mellitus, hypertension, morbid obesity, prostate cancer, thrombocytopenia presented with generalized weakness for about a week  Pt admitted with generalized weakness and severe sepsis secondary to UTI.   Reviewed I/O's: +1.6 L x 24 hours  UOP: 800 ml x 24 hours  Pt with history of metastatic prostate cancer and is followed by urology. Pt is on Erleada at home.   Pt reports feeling poorly and has a flat affect during visit. Pt endorses weakness and general decline in health over the past several months and has been wheelchair bound since October 2023. Per pt, he has had no oral intake over the past 3 days. He shares he has had poor oral intake over the past several months. Pt endorses lack of desire to eat, due to no appetite and everything having a "bland" taste. He also shares that he requires a lot of effort to chew and one swallowed "everything just tastes like a chunk of cardboard".   Pt reports he has "lost a lot of weight" over the past 6 months, but is unsure of amount of his UBW. Reviewed wt hx; pt has experienced a 20.2% wt loss over the past 5  months, which is significant for time frame.   Discussed importance of good meal and supplement intake to promote healing. Pt amenable to supplements and diet downgrade.   Medications reviewed and include lactated ringers infusion @ 150 ml/hr.    Lab Results  Component Value Date   HGBA1C 6.5 (H) 09/27/2022   PTA DM medications are 500 mg metformin BID.   Labs reviewed: Na: 134, K: 2.7, Phos: 2.3, Mg: 1.3, CBGS: 79-88 (inpatient orders for glycemic control are 0-9 units insulin aspart TID with meals).    NUTRITION - FOCUSED PHYSICAL EXAM:  Flowsheet Row Most Recent Value  Orbital Region No depletion  Upper Arm Region Mild depletion  Thoracic and Lumbar Region No depletion  Buccal Region Mild depletion  Temple Region Mild depletion  Clavicle Bone Region No depletion  Clavicle and Acromion Bone Region No depletion  Scapular Bone Region No depletion  Dorsal Hand Mild depletion  Patellar Region Moderate depletion  Anterior Thigh Region Moderate depletion  Posterior Calf Region Moderate depletion  Edema (RD Assessment) None  Hair Reviewed  Eyes Reviewed  Mouth Reviewed  Skin Reviewed  Nails Reviewed       Diet Order:   Diet Order             Diet Carb Modified Fluid consistency: Thin; Room service appropriate? Yes  Diet effective now                   EDUCATION NEEDS:   Education needs have been  addressed  Skin:  Skin Assessment: Reviewed RN Assessment  Last BM:  02/08/23 (type 7)  Height:   Ht Readings from Last 1 Encounters:  02/07/23 5\' 10"  (1.778 m)    Weight:   Wt Readings from Last 1 Encounters:  02/07/23 79.6 kg    Ideal Body Weight:  75.5 kg  BMI:  Body mass index is 25.17 kg/m.  Estimated Nutritional Needs:   Kcal:  [id number]  Protein:  115-130 grams  Fluid:  > 2 L    Loistine Chance, RD, LDN, [place] Registered Dietitian II Certified Diabetes Care and Education Specialist Please refer to [facility] for RD and/or RD on-call/weekend/after  hours pager

## 2023-02-08 NOTE — Telephone Encounter (Signed)
Pharmacy Patient Advocate Encounter  Insurance verification completed.    The patient is insured through [employer]   The patient is currently admitted and ran test claims for the following: Vancomycin, Dificid.  Copays and coinsurance results were relayed to Inpatient clinical team.

## 2023-02-08 NOTE — Progress Notes (Signed)
Pharmacy Antibiotic Note  Roger [name] is a 67 y.o. male admitted on 02/07/2023 with sepsis and UTI.  Pharmacy has been consulted for Vancomycin, Cefepime dosing.  Plan: Cefepime 2 gm IV X 1 given in ED on 3/14 @  1956. Cefepime 2 gm IV Q12H ordered to start on 3/15 @ 0800.  Vancomycin 1750 mg IV X 1 given ordered for 3/15 @ ~ 0200. Vancomycin 1500 mg IV Q24H ordered to start on 3/16 @ ~ 0200.   AUC = 515.8 Vanc trough = 11.9   Height: 5\' 10"  (177.8 cm) Weight: 79.6 kg (175 lb 6.4 oz) IBW/kg (Calculated) : 73  Temp (24hrs), Avg:98.9 F (37.2 C), Min:98.2 F (36.8 C), Max:99.3 F (37.4 C)  Recent Labs  Lab 02/07/23 1919 02/07/23 1939 02/07/23 2218  WBC 20.4*  --   --   CREATININE 1.38*  --   --   LATICACIDVEN  --  2.0* 2.2*    Estimated Creatinine Clearance: 55.8 mL/min (A) (by C-G formula based on SCr of 1.38 mg/dL (H)).    Allergies  Allergen Reactions   Albuterol Nausea And Vomiting   Mucinex [Guaifenesin Er] Rash    Antimicrobials this admission:   >>    >>   Dose adjustments this admission:   Microbiology results:  BCx:   UCx:    Sputum:    MRSA PCR:   Thank you for allowing pharmacy to be a part of this patient's care.  Tavaris Eudy D 02/08/2023 1:07 AM

## 2023-02-08 NOTE — Progress Notes (Addendum)
PROGRESS NOTE    Roger [name]  [id number] DOB: 02-04-1956 DOA: 02/07/2023 PCP: Lahoma Rocker, MD   Brief Narrative:  This 67 yrs old Male with PMH significant for diabetes mellitus, hypertension, morbid obesity, prostate cancer, thrombocytopenia presented in the ED with generalized weakness for about a week.  Patient has history of metastatic prostate cancer and on arrival he was found to be febrile with a temp of 100.4.  Patient is following up with urology and the Foley catheter was removed 2 days ago with the plans for measuring PVR.  Patient meet sepsis criteria started on empiric antibiotics vancomycin and cefepime.  Initial lactic acid 2.0, UA nitrites+, LE+, stool for C. Difficile+, patient is admitted for further evaluation.  Assessment & Plan:   Principal Problem:   Generalized weakness Active Problems:   Alcohol use   Folate deficiency   Severe sepsis secondary to UTI ([place])   AKI (acute kidney injury) ([place])   Indwelling Foley catheter present   Prostate cancer metastatic to bone (HCC)   Anemia, chronic disease   Essential hypertension   Diabetes mellitus without complication (HCC)   Diarrhea  Generalized weakness: Patient presented with generalized weakness could be multifactorial. It could be sepsis secondary to urinary tract infection and dehydration. Continue IV fluid resuscitation and empiric antibiotics. PT and OT evaluation. Continue fall precautions.  Severe sepsis secondary to UTI: Patient met sepsis criteria (tachycardia, fever, tachypnea, lactic acid 2.0, UA positive for UTI).  Continue IV fluid resuscitation and IV antibiotics. Follow-up blood cultures, urine cultures.  Acute kidney injury: Baseline serum creatinine 1.0, creatinine on arrival 1.38. Likely in the setting of sepsis, dehydration, post-obstructive symptoms. Insert Foley catheter for urinary retention. Continue IV fluid resuscitation.  Avoid nephrotoxic medications. Monitor serum  creatinine  Metastatic prostate cancer: Patient follows up with urology. Continue patient on home regimen of [place]. Outpatient oncology follow up.  Acute on chronic normocytic normochromic anemia: Baseline hemoglobin remained between 9 and 10. Presented with hemoglobin 6.0.  There is no any obvious visible bleeding Transfuse 2 unit PRBC.  Monitor H&H.  Essential hypertension: Hold Imdur and lisinopril due to soft blood pressure. Continue IV fluid resuscitation.  Diabetes mellitus: Carb modified diet. Hold metformin due to AKI. Last hemoglobin A1c 6.5. Continue regular insulin sliding scale.  Alcohol use: Patient does have a history of alcohol abuse.   Continue CIWA protocol.  C. Difficile+: Patient reports having diarrhea.  Stool for C. difficile positive Start p.o. vancomycin 125 mg po q6hr  Hypomagnesemia/hypokalemia: Replaced.  Continue to monitor   DVT prophylaxis: SCDs Code Status: Full code Family Communication: No family at bed side. Disposition Plan:     Status is: Inpatient Remains inpatient appropriate because: Admitted for multiple problems.  Sepsis secondary to UTI, acute kidney injury, C. difficile positive, anemia requiring blood transfusion    Consultants:  None  Procedures: None  Antimicrobials:  Anti-infectives (From admission, onward)    Start     Dose/Rate Route Frequency Ordered Stop   02/09/23 0200  vancomycin (VANCOREADY) IVPB 1500 mg/300 mL        1,500 mg 150 mL/hr over 120 Minutes Intravenous Every 24 hours 02/08/23 0106     02/08/23 0800  ceFEPIme (MAXIPIME) 2 g in sodium chloride 0.9 % 100 mL IVPB        2 g 200 mL/hr over 30 Minutes Intravenous Every 12 hours 02/08/23 0107     02/08/23 0115  vancomycin (VANCOREADY) IVPB 1750 mg/350 mL  1,750 mg 175 mL/hr over 120 Minutes Intravenous  Once 02/08/23 0101 02/08/23 0432   02/07/23 1915  ceFEPIme (MAXIPIME) 2 g in sodium chloride 0.9 % 100 mL IVPB        2 g 200 mL/hr over  30 Minutes Intravenous  Once 02/07/23 1907 02/07/23 2026        Subjective: Patient was seen and examined at bedside.  Overnight events noted. Patient continued to have loose watery stools.  He otherwise feels better. He denies any shortness of breath, dizziness, palpitations.  Objective: Vitals:   02/08/23 0045 02/08/23 0202 02/08/23 0739 02/08/23 1155  BP:  111/76 115/74 125/84  Pulse: 87 89 80 80  Resp: 19 17 20 20   Temp:  99.2 F (37.3 C) 98.2 F (36.8 C) 97.9 F (36.6 C)  TempSrc:  Oral Oral Oral  SpO2: 99% 100% 100% 100%  Weight:      Height:        Intake/Output Summary (Last 24 hours) at 02/08/2023 1335 Last data filed at 02/08/2023 1230 Gross per 24 hour  Intake 2170.1 ml  Output 800 ml  Net 1370.1 ml   Filed Weights   02/07/23 1905  Weight: 79.6 kg    Examination:  General exam: Appears comfortable, deconditioned, not in any acute distress. Respiratory system: CTA bilaterally . Respiratory effort normal.  RR 15 Cardiovascular system: S1 & S2 heard, regular rate and rhythm, no murmur. Gastrointestinal system: Abdomen is soft, non tender, non distended, BS+ Central nervous system: Alert and oriented x 3. No focal neurological deficits. Extremities: No edema, no cyanosis, no clubbing Skin: No rashes, lesions or ulcers Psychiatry: Judgement and insight appear normal. Mood & affect appropriate.     Data Reviewed: I have personally reviewed following labs and imaging studies  CBC: Recent Labs  Lab 02/07/23 1919 02/08/23 0016 02/08/23 0416  WBC 20.4*  --  21.5*  NEUTROABS 17.0*  --  17.6*  HGB 7.2* 6.0* 5.7*  HCT 24.7* 19.1* 17.0*  MCV 107.9*  --  97.1  PLT 241  --  [id number]   Basic Metabolic Panel: Recent Labs  Lab 02/07/23 1919 02/08/23 0016 02/08/23 0416  NA 133*  --  134*  K 2.5*  --  2.7*  CL 100  --  106  CO2 20*  --  20*  GLUCOSE 92  --  86  BUN 19  --  17  CREATININE 1.38*  --  1.02  CALCIUM 6.9*  --  6.5*  MG  --  1.2* 1.3*  PHOS   --   --  2.3*   GFR: Estimated Creatinine Clearance: 75.5 mL/min (by C-G formula based on SCr of 1.02 mg/dL). Liver Function Tests: Recent Labs  Lab 02/07/23 1919 02/08/23 0416  AST 18 11*  ALT 6 5  ALKPHOS 165* 128*  BILITOT 1.9* 1.4*  PROT 5.7* 4.7*  ALBUMIN 2.1* 1.6*   No results for input(s): "LIPASE", "AMYLASE" in the last 168 hours. No results for input(s): "AMMONIA" in the last 168 hours. Coagulation Profile: Recent Labs  Lab 02/07/23 2111  INR 1.3*   Cardiac Enzymes: No results for input(s): "CKTOTAL", "CKMB", "CKMBINDEX", "TROPONINI" in the last 168 hours. BNP (last 3 results) No results for input(s): "PROBNP" in the last 8760 hours. HbA1C: No results for input(s): "HGBA1C" in the last 72 hours. CBG: Recent Labs  Lab 02/08/23 0741 02/08/23 1154  GLUCAP 88 79   Lipid Profile: No results for input(s): "CHOL", "HDL", "LDLCALC", "TRIG", "CHOLHDL", "LDLDIRECT" in the  last 72 hours. Thyroid Function Tests: No results for input(s): "TSH", "T4TOTAL", "FREET4", "T3FREE", "THYROIDAB" in the last 72 hours. Anemia Panel: No results for input(s): "VITAMINB12", "FOLATE", "FERRITIN", "TIBC", "IRON", "RETICCTPCT" in the last 72 hours. Sepsis Labs: Recent Labs  Lab 02/07/23 1939 02/07/23 2218  LATICACIDVEN 2.0* 2.2*    Recent Results (from the past 240 hour(s))  Resp panel by RT-PCR (RSV, Flu A&B, Covid) Anterior Nasal Swab     Status: None   Collection Time: 02/07/23  7:17 PM   Specimen: Anterior Nasal Swab  Result Value Ref Range Status   SARS Coronavirus 2 by RT PCR NEGATIVE NEGATIVE Final    Comment: (NOTE) SARS-CoV-2 target nucleic acids are NOT DETECTED.  The SARS-CoV-2 RNA is generally detectable in upper respiratory specimens during the acute phase of infection. The lowest concentration of SARS-CoV-2 viral copies this assay can detect is 138 copies/mL. A negative result does not preclude SARS-Cov-2 infection and should not be used as the sole basis for  treatment or other patient management decisions. A negative result may occur with  improper specimen collection/handling, submission of specimen other than nasopharyngeal swab, presence of viral mutation(s) within the areas targeted by this assay, and inadequate number of viral copies(<138 copies/mL). A negative result must be combined with clinical observations, patient history, and epidemiological information. The expected result is Negative.  Fact Sheet for Patients:  EntrepreneurPulse.com.au  Fact Sheet for Healthcare Providers:  IncredibleEmployment.be  This test is no t yet approved or cleared by the [place] FDA and  has been authorized for detection and/or diagnosis of SARS-CoV-2 by FDA under an Emergency Use Authorization (EUA). This EUA will remain  in effect (meaning this test can be used) for the duration of the COVID-19 declaration under Section 564(b)(1) of the Act, 21 U.S.C.section 360bbb-3(b)(1), unless the authorization is terminated  or revoked sooner.       Influenza A by PCR NEGATIVE NEGATIVE Final   Influenza B by PCR NEGATIVE NEGATIVE Final    Comment: (NOTE) The Xpert Xpress SARS-CoV-2/FLU/RSV plus assay is intended as an aid in the diagnosis of influenza from Nasopharyngeal swab specimens and should not be used as a sole basis for treatment. Nasal washings and aspirates are unacceptable for Xpert Xpress SARS-CoV-2/FLU/RSV testing.  Fact Sheet for Patients: EntrepreneurPulse.com.au  Fact Sheet for Healthcare Providers: IncredibleEmployment.be  This test is not yet approved or cleared by the [place] FDA and has been authorized for detection and/or diagnosis of SARS-CoV-2 by FDA under an Emergency Use Authorization (EUA). This EUA will remain in effect (meaning this test can be used) for the duration of the COVID-19 declaration under Section 564(b)(1) of the Act, 21  U.S.C. section 360bbb-3(b)(1), unless the authorization is terminated or revoked.     Resp Syncytial Virus by PCR NEGATIVE NEGATIVE Final    Comment: (NOTE) Fact Sheet for Patients: EntrepreneurPulse.com.au  Fact Sheet for Healthcare Providers: IncredibleEmployment.be  This test is not yet approved or cleared by the [place] FDA and has been authorized for detection and/or diagnosis of SARS-CoV-2 by FDA under an Emergency Use Authorization (EUA). This EUA will remain in effect (meaning this test can be used) for the duration of the COVID-19 declaration under Section 564(b)(1) of the Act, 21 U.S.C. section 360bbb-3(b)(1), unless the authorization is terminated or revoked.  Performed at [facility], [address] [place], [place] [address]   Blood Culture (routine x 2)     Status: None (Preliminary result)   Collection Time: 02/07/23  7:19 PM   Specimen: BLOOD  Result Value Ref Range Status   Specimen Description BLOOD BLOOD LEFT ARM  Final   Special Requests   Final    BOTTLES DRAWN AEROBIC AND ANAEROBIC Blood Culture results may not be optimal due to an inadequate volume of blood received in culture bottles   Culture   Final    NO GROWTH < 12 HOURS Performed at [facility], [address], [place], [place] [address]    Report Status PENDING  Incomplete  Blood Culture (routine x 2)     Status: None (Preliminary result)   Collection Time: 02/07/23  8:16 PM   Specimen: BLOOD  Result Value Ref Range Status   Specimen Description BLOOD BLOOD LEFT ARM  Final   Special Requests   Final    BOTTLES DRAWN AEROBIC AND ANAEROBIC Blood Culture adequate volume   Culture   Final    NO GROWTH < 12 HOURS Performed at [facility], [address], [place], [place] [address]    Report Status PENDING  Incomplete  Urine Culture     Status: None (Preliminary result)   Collection Time: 02/07/23  9:11 PM   Specimen:  Urine, Clean Catch  Result Value Ref Range Status   Specimen Description   Final    URINE, CLEAN CATCH Performed at [facility], [place] [address], [place], [place] [address]    Special Requests   Final    NONE Reflexed from [id number] Performed at [facility], [address] [place], [place] [address]    Culture PENDING  Incomplete   Report Status PENDING  Incomplete  Gastrointestinal Panel by PCR , Stool     Status: None   Collection Time: 02/08/23  1:17 AM   Specimen: STOOL  Result Value Ref Range Status   Campylobacter species NOT DETECTED NOT DETECTED Final   Plesimonas shigelloides NOT DETECTED NOT DETECTED Final   Salmonella species NOT DETECTED NOT DETECTED Final   Yersinia enterocolitica NOT DETECTED NOT DETECTED Final   Vibrio species NOT DETECTED NOT DETECTED Final   Vibrio cholerae NOT DETECTED NOT DETECTED Final   Enteroaggregative E coli (EAEC) NOT DETECTED NOT DETECTED Final   Enteropathogenic E coli (EPEC) NOT DETECTED NOT DETECTED Final   Enterotoxigenic E coli (ETEC) NOT DETECTED NOT DETECTED Final   Shiga like toxin producing E coli (STEC) NOT DETECTED NOT DETECTED Final   Shigella/Enteroinvasive E coli (EIEC) NOT DETECTED NOT DETECTED Final   Cryptosporidium NOT DETECTED NOT DETECTED Final   Cyclospora cayetanensis NOT DETECTED NOT DETECTED Final   Entamoeba histolytica NOT DETECTED NOT DETECTED Final   Giardia lamblia NOT DETECTED NOT DETECTED Final   Adenovirus F40/41 NOT DETECTED NOT DETECTED Final   Astrovirus NOT DETECTED NOT DETECTED Final   Norovirus GI/GII NOT DETECTED NOT DETECTED Final   Rotavirus A NOT DETECTED NOT DETECTED Final   Sapovirus (I, II, IV, and V) NOT DETECTED NOT DETECTED Final    Comment: Performed at [facility], [address] [place], [place] [address]  C Difficile Quick Screen w PCR reflex     Status: Abnormal   Collection Time: 02/08/23  1:17 AM   Specimen: STOOL  Result Value Ref Range Status   C Diff  antigen POSITIVE (A) NEGATIVE Final   C Diff toxin NEGATIVE NEGATIVE Final   C Diff interpretation Results are indeterminate. See PCR results.  Final    Comment: Performed at [facility], [address] [place], [place] [address]  C. Diff by PCR, Reflexed     Status: Abnormal   Collection Time: 02/08/23  1:17 AM  Result Value Ref Range Status   Toxigenic C. Difficile by PCR POSITIVE (A) NEGATIVE Final    Comment: Positive for toxigenic C. difficile with little to no toxin production. Only treat if clinical presentation suggests symptomatic illness. Performed at [facility], [address] [place], [place] [address]     Radiology Studies: DG Chest [place] 1 View  Result Date: 02/07/2023 CLINICAL DATA:  Questionable sepsis EXAM: PORTABLE CHEST 1 VIEW COMPARISON:  Chest x-ray dated October 17, 2022 FINDINGS: The heart size and mediastinal contours are within normal limits. Both lungs are clear. Numerous sclerotic osseous lesions, consistent with patient history of metastatic prostate cancer. IMPRESSION: 1. Lungs are clear. 2. Numerous sclerotic osseous lesions, consistent with patient history of metastatic prostate cancer. Electronically Signed   By: Yetta Glassman M.D.   On: 02/07/2023 19:32    Scheduled Meds:  apalutamide  240 mg Oral Daily   insulin aspart  0-9 Units Subcutaneous TID WC   potassium chloride  40 mEq Oral Once   rosuvastatin  10 mg Oral Daily   Continuous Infusions:  ceFEPime (MAXIPIME) IV     lactated ringers 150 mL/hr at 02/08/23 1237   potassium chloride 10 mEq (02/08/23 1235)   [START ON 02/09/2023] vancomycin       LOS: 1 day    Time spent: 50 mins    Duard Brady, MD [facility]   If 7PM-7AM, please contact night-coverage

## 2023-02-08 NOTE — Progress Notes (Signed)
Patient arrived to floor from ED on stretcher by Nurse. A/O x 4 on room air. No respiratory distress. Bilateral iv intact. Male purewick in place. Personal belonging at bedside. Made comfortable in bed. Call bell given to pt. Bed alarm on.

## 2023-02-08 NOTE — Progress Notes (Signed)
       CROSS COVER NOTE  NAME: Roger [name] MRN: [id number] DOB : 1956/05/13    HPI/Events of Note   Report:potassium 2.7  On review of chart: Review of labs/reports. HGB 6.0 now 5.7 no blood received Mag 1.3   Assessment and  Interventions   Assessment: Discussed with patient risks benefits blood transfusion, agreed to blood transfusion  No over t bleeding Plan: Mag 4 gm Potassium 8o meq 40 oral 40 IV mag 4 gms Maintain 2 IV access Order for urethral foley discontinued, patient voiding with prostate cancer history, may need urology to place      Kathlene Cote NP [facility]

## 2023-02-08 NOTE — Progress Notes (Signed)
CODE SEPSIS - PHARMACY COMMUNICATION  **Broad Spectrum Antibiotics should be administered within 1 hour of Sepsis diagnosis**  Time Code Sepsis Called/Page Received: 3/14 @ 2350  Antibiotics Ordered: Cefepime, Vancomycin   Time of 1st antibiotic administration: Cefepime 2 gm IV  X 1 on 3/14 @ 1956.   Additional action taken by pharmacy:   If necessary, Name of Provider/Nurse Contacted:     Amr Sturtevant D ,PharmD Clinical Pharmacist  02/08/2023  1:30 AM

## 2023-02-08 NOTE — Progress Notes (Signed)
Lab called with critical potassium of 2.7. Provider informed. New order in place. Hb low bld ordered. Mag replacement order.

## 2023-02-09 DIAGNOSIS — R531 Weakness: Secondary | ICD-10-CM | POA: Diagnosis not present

## 2023-02-09 DIAGNOSIS — E440 Moderate protein-calorie malnutrition: Secondary | ICD-10-CM | POA: Insufficient documentation

## 2023-02-09 LAB — BPAM RBC
Blood Product Expiration Date: 202403202359
Blood Product Expiration Date: 202404182359
ISSUE DATE / TIME: 202403151348
ISSUE DATE / TIME: 202403152003
Unit Type and Rh: 5100
Unit Type and Rh: 5100

## 2023-02-09 LAB — CBC
HCT: 27.7 % — ABNORMAL LOW (ref 39.0–52.0)
Hemoglobin: 9.3 g/dL — ABNORMAL LOW (ref 13.0–17.0)
MCH: 31.3 pg (ref 26.0–34.0)
MCHC: 33.6 g/dL (ref 30.0–36.0)
MCV: 93.3 fL (ref 80.0–100.0)
Platelets: 230 10*3/uL (ref 150–400)
RBC: 2.97 MIL/uL — ABNORMAL LOW (ref 4.22–5.81)
RDW: 18.4 % — ABNORMAL HIGH (ref 11.5–15.5)
WBC: 22.5 10*3/uL — ABNORMAL HIGH (ref 4.0–10.5)
nRBC: 0 % (ref 0.0–0.2)

## 2023-02-09 LAB — BASIC METABOLIC PANEL
Anion gap: 5 (ref 5–15)
BUN: 14 mg/dL (ref 8–23)
CO2: 17 mmol/L — ABNORMAL LOW (ref 22–32)
Calcium: 7.8 mg/dL — ABNORMAL LOW (ref 8.9–10.3)
Chloride: 109 mmol/L (ref 98–111)
Creatinine, Ser: 0.94 mg/dL (ref 0.61–1.24)
GFR, Estimated: >60 mL/min (ref >60)
Glucose, Bld: 84 mg/dL (ref 70–99)
Potassium: 3.7 mmol/L (ref 3.5–5.1)
Sodium: 131 mmol/L — ABNORMAL LOW (ref 135–145)

## 2023-02-09 LAB — TYPE AND SCREEN
ABO/RH(D): O POS
Antibody Screen: NEGATIVE
Unit division: 0
Unit division: 0

## 2023-02-09 LAB — URINE CULTURE: Culture: 20000 — AB

## 2023-02-09 LAB — MAGNESIUM: Magnesium: 1.9 mg/dL (ref 1.7–2.4)

## 2023-02-09 LAB — GLUCOSE, CAPILLARY
Glucose-Capillary: 74 mg/dL (ref 70–99)
Glucose-Capillary: 92 mg/dL (ref 70–99)
Glucose-Capillary: 97 mg/dL (ref 70–99)

## 2023-02-09 LAB — PHOSPHORUS: Phosphorus: 2.3 mg/dL — ABNORMAL LOW (ref 2.5–4.6)

## 2023-02-09 MED ORDER — OXYCODONE HCL 5 MG PO TABS
7.5000 mg | ORAL_TABLET | ORAL | Status: DC | PRN
  Administered 2023-02-09 – 2023-02-11 (×6): 7.5 mg via ORAL
  Filled 2023-02-09 (×6): qty 2

## 2023-02-09 MED ORDER — LISINOPRIL 5 MG PO TABS
5.0000 mg | ORAL_TABLET | Freq: Every day | ORAL | Status: DC
  Administered 2023-02-09 – 2023-02-11 (×3): 5 mg via ORAL
  Filled 2023-02-09 (×3): qty 1

## 2023-02-09 MED ORDER — VANCOMYCIN HCL IN DEXTROSE 1-5 GM/200ML-% IV SOLN
1000.0000 mg | Freq: Two times a day (BID) | INTRAVENOUS | Status: DC
  Administered 2023-02-09: 1000 mg via INTRAVENOUS
  Filled 2023-02-09 (×2): qty 200

## 2023-02-09 MED ORDER — ISOSORBIDE MONONITRATE ER 30 MG PO TB24
30.0000 mg | ORAL_TABLET | Freq: Every day | ORAL | Status: DC
  Administered 2023-02-09 – 2023-02-11 (×3): 30 mg via ORAL
  Filled 2023-02-09 (×3): qty 1

## 2023-02-09 MED ORDER — OXYCODONE HCL 5 MG PO TABS: 7.5000 mg | ORAL_TABLET | Freq: Four times a day (QID) | ORAL | Status: DC | PRN

## 2023-02-09 MED ORDER — SODIUM PHOSPHATES 45 MMOLE/15ML IV SOLN
30.0000 mmol | Freq: Once | INTRAVENOUS | Status: AC
  Administered 2023-02-09: 30 mmol via INTRAVENOUS
  Filled 2023-02-09: qty 10

## 2023-02-09 NOTE — Progress Notes (Signed)
Pharmacy Antibiotic Note  Roger [name] is a 67 y.o. male admitted on 02/07/2023 with sepsis and UTI.  Pharmacy has been consulted for Vancomycin, Cefepime dosing.  Plan: Cefepime 2 gm IV Q8H   Vancomycin 1000 mg IV Q 12 hrs. Goal AUC 400-550. Expected AUC: 481.7 Expected Cmin: 13.5 SCr used: 0.94   Height: 5\' 10"  (177.8 cm) Weight: 79.6 kg (175 lb 6.4 oz) IBW/kg (Calculated) : 73  Temp (24hrs), Avg:98 F (36.7 C), Min:97.6 F (36.4 C), Max:98.3 F (36.8 C)  Recent Labs  Lab 02/07/23 1919 02/07/23 1939 02/07/23 2218 02/08/23 0416 02/09/23 0610  WBC 20.4*  --   --  21.5* 22.5*  CREATININE 1.38*  --   --  1.02 0.94  LATICACIDVEN  --  2.0* 2.2*  --   --      Estimated Creatinine Clearance: 82 mL/min (by C-G formula based on SCr of 0.94 mg/dL).    Allergies  Allergen Reactions   Albuterol Nausea And Vomiting   Mucinex [Guaifenesin Er] Rash    Antimicrobials this admission: Cefepime >> 3/15 Vancomycin >>   Dose adjustments this admission: Vancomycin adjusted from 1500mg  Q24 to 1000mg  Q12 d/t improved renal function  Microbiology results:  BCx: NGTD  UCx:  20k mult species  MRSA PCR: ordered  Thank you for allowing pharmacy to be a part of this patient's care.  Roger [name] 02/09/2023 8:57 AM

## 2023-02-09 NOTE — Progress Notes (Signed)
PROGRESS NOTE    Roger [name]  [id number] DOB: 02-08-1956 DOA: 02/07/2023 PCP: Lahoma Rocker, MD   Brief Narrative:  This 67 yrs old Male with PMH significant for diabetes mellitus, hypertension, morbid obesity, prostate cancer, thrombocytopenia presented in the ED with generalized weakness for about a week.  Patient has history of metastatic prostate cancer and on arrival he was found to be febrile with a temp of 100.4.  Patient is following up with urology and the Foley catheter was removed 2 days ago with the plans for measuring PVR.  Patient meet sepsis criteria started on empiric antibiotics vancomycin and cefepime.  Initial lactic acid 2.0, UA nitrites+, LE+, stool for C. Difficile+, Patient is admitted for further evaluation.  Assessment & Plan:   Principal Problem:   Generalized weakness Active Problems:   Alcohol use   Folate deficiency   Severe sepsis secondary to UTI ([place])   AKI (acute kidney injury) ([place])   Indwelling Foley catheter present   Prostate cancer metastatic to bone (HCC)   Anemia, chronic disease   Essential hypertension   Diabetes mellitus without complication (HCC)   Diarrhea   Malnutrition of moderate degree  Generalized weakness: Patient presented with generalized weakness could be multifactorial. It could be sepsis secondary to urinary tract infection and dehydration. Continue IV fluid resuscitation and empiric antibiotics. PT and OT evaluation. Continue fall precautions.  Severe sepsis secondary to UTI: Patient met sepsis criteria (tachycardia, fever, tachypnea, lactic acid 2.0, UA positive for UTI).   Continue IV fluid resuscitation and empiric IV antibiotics. Blood culture NGTD, urine cultures multiple species contaminated.  Acute kidney injury: > Resolved. Baseline serum creatinine 1.0, creatinine on arrival 1.38. Likely in the setting of sepsis, dehydration, post-obstructive symptoms. Insert Foley catheter for urinary  retention. Continue IV fluid resuscitation.  Avoid nephrotoxic medications. AKI resolved, serum creatinine improved.  Metastatic prostate cancer: Patient follows up with urology. Continue patient on home regimen of [place]. Outpatient oncology follow up.  Acute on chronic normocytic normochromic anemia: Baseline hemoglobin remained between 9 and 10. Presented with hemoglobin 6.0.  There is no any obvious visible bleeding S/p 2 unit PRBC.  Hemoglobin 9.3.  Essential hypertension: Resume Imdur and lisinopril as BP improved. Continue IV fluid resuscitation.  Diabetes mellitus: Carb modified diet. Hold metformin due to AKI. Last hemoglobin A1c 6.5. Continue regular insulin sliding scale.  Alcohol use: Patient does have a history of alcohol abuse.   Continue CIWA protocol.  C. Difficile+: Patient reports having diarrhea.  Stool for C. difficile positive Start p.o. vancomycin 125 mg po q6hr  Hypomagnesemia/hypokalemia: Replaced.  Continue to monitor.  Leukocytosis: Likely elevated due to C. difficile and UTI. Continue antibiotics and monitor WBC.  DVT prophylaxis: SCDs Code Status: Full code Family Communication: No family at bed side. Disposition Plan:     Status is: Inpatient Remains inpatient appropriate because: Admitted for multiple problems.  Sepsis secondary to UTI, acute kidney injury, C. difficile positive, anemia requiring blood transfusion    Consultants:  None  Procedures: None  Antimicrobials:  Anti-infectives (From admission, onward)    Start     Dose/Rate Route Frequency Ordered Stop   02/09/23 2200  vancomycin (VANCOCIN) IVPB 1000 mg/200 mL premix        1,000 mg 200 mL/hr over 60 Minutes Intravenous Every 12 hours 02/09/23 0857     02/09/23 0200  vancomycin (VANCOREADY) IVPB 1500 mg/300 mL  Status:  Discontinued        1,500 mg 150 mL/hr over  120 Minutes Intravenous Every 24 hours 02/08/23 0106 02/09/23 0857   02/08/23 1700  ceFEPIme  (MAXIPIME) 2 g in sodium chloride 0.9 % 100 mL IVPB        2 g 200 mL/hr over 30 Minutes Intravenous Every 8 hours 02/08/23 1422     02/08/23 1530  vancomycin (VANCOCIN) capsule 125 mg        125 mg Oral 4 times daily 02/08/23 1430 02/18/23 1359   02/08/23 0800  ceFEPIme (MAXIPIME) 2 g in sodium chloride 0.9 % 100 mL IVPB  Status:  Discontinued        2 g 200 mL/hr over 30 Minutes Intravenous Every 12 hours 02/08/23 0107 02/08/23 1422   02/08/23 0115  vancomycin (VANCOREADY) IVPB 1750 mg/350 mL        1,750 mg 175 mL/hr over 120 Minutes Intravenous  Once 02/08/23 0101 02/08/23 0432   02/07/23 1915  ceFEPIme (MAXIPIME) 2 g in sodium chloride 0.9 % 100 mL IVPB        2 g 200 mL/hr over 30 Minutes Intravenous  Once 02/07/23 1907 02/07/23 2026        Subjective: Patient was seen and examined at bedside.  Overnight events noted. Patient reports feeling better, reports diarrhea has improved. He denies any shortness of breath, dizziness, palpitations.  Objective: Vitals:   02/08/23 2036 02/09/23 0029 02/09/23 0529 02/09/23 0732  BP: (!) 136/90 (!) 143/90 135/88 (!) 130/96  Pulse: 82 80 82 77  Resp: 18 20 20 18   Temp: 98 F (36.7 C) 98.2 F (36.8 C) 97.9 F (36.6 C) 98.2 F (36.8 C)  TempSrc:    Oral  SpO2: 98% 100% 100% 100%  Weight:      Height:        Intake/Output Summary (Last 24 hours) at 02/09/2023 1032 Last data filed at 02/09/2023 0029 Gross per 24 hour  Intake 1739.54 ml  Output 800 ml  Net 939.54 ml   Filed Weights   02/07/23 1905  Weight: 79.6 kg    Examination:  General exam: Appears comfortable, deconditioned, not in any acute distress. Respiratory system: CTA bilaterally, respiratory effort normal, RR 16. Cardiovascular system: S1 & S2 heard, regular rate and rhythm, no murmur. Gastrointestinal system: Abdomen is soft, non tender, non distended, BS+ Central nervous system: Alert and oriented x 3. No focal neurological deficits. Extremities: No edema, no  cyanosis, no clubbing Skin: No rashes, lesions or ulcers Psychiatry: Judgement and insight appear normal. Mood & affect appropriate.     Data Reviewed: I have personally reviewed following labs and imaging studies  CBC: Recent Labs  Lab 02/07/23 1919 02/08/23 0016 02/08/23 0416 02/09/23 0610  WBC 20.4*  --  21.5* 22.5*  NEUTROABS 17.0*  --  17.6*  --   HGB 7.2* 6.0* 5.7* 9.3*  HCT 24.7* 19.1* 17.0* 27.7*  MCV 107.9*  --  97.1 93.3  PLT 241  --  194 [id number]   Basic Metabolic Panel: Recent Labs  Lab 02/07/23 1919 02/08/23 0016 02/08/23 0416 02/09/23 0610  NA 133*  --  134* 131*  K 2.5*  --  2.7* 3.7  CL 100  --  106 109  CO2 20*  --  20* 17*  GLUCOSE 92  --  86 84  BUN 19  --  17 14  CREATININE 1.38*  --  1.02 0.94  CALCIUM 6.9*  --  6.5* 7.8*  MG  --  1.2* 1.3* 1.9  PHOS  --   --  2.3* 2.3*   GFR: Estimated Creatinine Clearance: 82 mL/min (by C-G formula based on SCr of 0.94 mg/dL). Liver Function Tests: Recent Labs  Lab 02/07/23 1919 02/08/23 0416  AST 18 11*  ALT 6 5  ALKPHOS 165* 128*  BILITOT 1.9* 1.4*  PROT 5.7* 4.7*  ALBUMIN 2.1* 1.6*   No results for input(s): "LIPASE", "AMYLASE" in the last 168 hours. No results for input(s): "AMMONIA" in the last 168 hours. Coagulation Profile: Recent Labs  Lab 02/07/23 2111  INR 1.3*   Cardiac Enzymes: No results for input(s): "CKTOTAL", "CKMB", "CKMBINDEX", "TROPONINI" in the last 168 hours. BNP (last 3 results) No results for input(s): "PROBNP" in the last 8760 hours. HbA1C: No results for input(s): "HGBA1C" in the last 72 hours. CBG: Recent Labs  Lab 02/08/23 0741 02/08/23 1154 02/08/23 1617 02/08/23 2008  GLUCAP 88 79 80 83   Lipid Profile: No results for input(s): "CHOL", "HDL", "LDLCALC", "TRIG", "CHOLHDL", "LDLDIRECT" in the last 72 hours. Thyroid Function Tests: No results for input(s): "TSH", "T4TOTAL", "FREET4", "T3FREE", "THYROIDAB" in the last 72 hours. Anemia Panel: No results for  input(s): "VITAMINB12", "FOLATE", "FERRITIN", "TIBC", "IRON", "RETICCTPCT" in the last 72 hours. Sepsis Labs: Recent Labs  Lab 02/07/23 1939 02/07/23 2218  LATICACIDVEN 2.0* 2.2*    Recent Results (from the past 240 hour(s))  Resp panel by RT-PCR (RSV, Flu A&B, Covid) Anterior Nasal Swab     Status: None   Collection Time: 02/07/23  7:17 PM   Specimen: Anterior Nasal Swab  Result Value Ref Range Status   SARS Coronavirus 2 by RT PCR NEGATIVE NEGATIVE Final    Comment: (NOTE) SARS-CoV-2 target nucleic acids are NOT DETECTED.  The SARS-CoV-2 RNA is generally detectable in upper respiratory specimens during the acute phase of infection. The lowest concentration of SARS-CoV-2 viral copies this assay can detect is 138 copies/mL. A negative result does not preclude SARS-Cov-2 infection and should not be used as the sole basis for treatment or other patient management decisions. A negative result may occur with  improper specimen collection/handling, submission of specimen other than nasopharyngeal swab, presence of viral mutation(s) within the areas targeted by this assay, and inadequate number of viral copies(<138 copies/mL). A negative result must be combined with clinical observations, patient history, and epidemiological information. The expected result is Negative.  Fact Sheet for Patients:  EntrepreneurPulse.com.au  Fact Sheet for Healthcare Providers:  IncredibleEmployment.be  This test is no t yet approved or cleared by the [place] FDA and  has been authorized for detection and/or diagnosis of SARS-CoV-2 by FDA under an Emergency Use Authorization (EUA). This EUA will remain  in effect (meaning this test can be used) for the duration of the COVID-19 declaration under Section 564(b)(1) of the Act, 21 U.S.C.section 360bbb-3(b)(1), unless the authorization is terminated  or revoked sooner.       Influenza A by PCR NEGATIVE  NEGATIVE Final   Influenza B by PCR NEGATIVE NEGATIVE Final    Comment: (NOTE) The Xpert Xpress SARS-CoV-2/FLU/RSV plus assay is intended as an aid in the diagnosis of influenza from Nasopharyngeal swab specimens and should not be used as a sole basis for treatment. Nasal washings and aspirates are unacceptable for Xpert Xpress SARS-CoV-2/FLU/RSV testing.  Fact Sheet for Patients: EntrepreneurPulse.com.au  Fact Sheet for Healthcare Providers: IncredibleEmployment.be  This test is not yet approved or cleared by the [place] FDA and has been authorized for detection and/or diagnosis of SARS-CoV-2 by FDA under an Emergency Use Authorization (EUA). This  EUA will remain in effect (meaning this test can be used) for the duration of the COVID-19 declaration under Section 564(b)(1) of the Act, 21 U.S.C. section 360bbb-3(b)(1), unless the authorization is terminated or revoked.     Resp Syncytial Virus by PCR NEGATIVE NEGATIVE Final    Comment: (NOTE) Fact Sheet for Patients: EntrepreneurPulse.com.au  Fact Sheet for Healthcare Providers: IncredibleEmployment.be  This test is not yet approved or cleared by the [place] FDA and has been authorized for detection and/or diagnosis of SARS-CoV-2 by FDA under an Emergency Use Authorization (EUA). This EUA will remain in effect (meaning this test can be used) for the duration of the COVID-19 declaration under Section 564(b)(1) of the Act, 21 U.S.C. section 360bbb-3(b)(1), unless the authorization is terminated or revoked.  Performed at [facility], [address] [place], [place] [address]   Blood Culture (routine x 2)     Status: None (Preliminary result)   Collection Time: 02/07/23  7:19 PM   Specimen: BLOOD  Result Value Ref Range Status   Specimen Description BLOOD BLOOD LEFT ARM  Final   Special Requests   Final    BOTTLES DRAWN AEROBIC  AND ANAEROBIC Blood Culture results may not be optimal due to an inadequate volume of blood received in culture bottles   Culture   Final    NO GROWTH 2 DAYS Performed at [facility], [address], [place], [place] [address]    Report Status PENDING  Incomplete  Blood Culture (routine x 2)     Status: None (Preliminary result)   Collection Time: 02/07/23  8:16 PM   Specimen: BLOOD  Result Value Ref Range Status   Specimen Description BLOOD BLOOD LEFT ARM  Final   Special Requests   Final    BOTTLES DRAWN AEROBIC AND ANAEROBIC Blood Culture adequate volume   Culture   Final    NO GROWTH 2 DAYS Performed at [facility], [address] [place], [place] [address]    Report Status PENDING  Incomplete  Urine Culture     Status: Abnormal   Collection Time: 02/07/23  9:11 PM   Specimen: Urine, Clean Catch  Result Value Ref Range Status   Specimen Description   Final    URINE, CLEAN CATCH Performed at [facility], [place] [address] [place], [place] [address]    Special Requests   Final    NONE Reflexed from [id number] Performed at [facility], [address] [place], [place] [address]    Culture (A)  Final    20,000 COLONIES/mL MULTIPLE SPECIES PRESENT, SUGGEST RECOLLECTION   Report Status 02/09/2023 FINAL  Final  Gastrointestinal Panel by PCR , Stool     Status: None   Collection Time: 02/08/23  1:17 AM   Specimen: STOOL  Result Value Ref Range Status   Campylobacter species NOT DETECTED NOT DETECTED Final   Plesimonas shigelloides NOT DETECTED NOT DETECTED Final   Salmonella species NOT DETECTED NOT DETECTED Final   Yersinia enterocolitica NOT DETECTED NOT DETECTED Final   Vibrio species NOT DETECTED NOT DETECTED Final   Vibrio cholerae NOT DETECTED NOT DETECTED Final   Enteroaggregative E coli (EAEC) NOT DETECTED NOT DETECTED Final   Enteropathogenic E coli (EPEC) NOT DETECTED NOT DETECTED Final   Enterotoxigenic E coli (ETEC) NOT DETECTED  NOT DETECTED Final   Shiga like toxin producing E coli (STEC) NOT DETECTED NOT DETECTED Final   Shigella/Enteroinvasive E coli (EIEC) NOT DETECTED NOT DETECTED Final  Cryptosporidium NOT DETECTED NOT DETECTED Final   Cyclospora cayetanensis NOT DETECTED NOT DETECTED Final   Entamoeba histolytica NOT DETECTED NOT DETECTED Final   Giardia lamblia NOT DETECTED NOT DETECTED Final   Adenovirus F40/41 NOT DETECTED NOT DETECTED Final   Astrovirus NOT DETECTED NOT DETECTED Final   Norovirus GI/GII NOT DETECTED NOT DETECTED Final   Rotavirus A NOT DETECTED NOT DETECTED Final   Sapovirus (I, II, IV, and V) NOT DETECTED NOT DETECTED Final    Comment: Performed at [facility], [address], [place], [place] [address]  C Difficile Quick Screen w PCR reflex     Status: Abnormal   Collection Time: 02/08/23  1:17 AM   Specimen: STOOL  Result Value Ref Range Status   C Diff antigen POSITIVE (A) NEGATIVE Final   C Diff toxin NEGATIVE NEGATIVE Final   C Diff interpretation Results are indeterminate. See PCR results.  Final    Comment: Performed at [facility], [address] [place], [place] [address]  C. Diff by PCR, Reflexed     Status: Abnormal   Collection Time: 02/08/23  1:17 AM  Result Value Ref Range Status   Toxigenic C. Difficile by PCR POSITIVE (A) NEGATIVE Final    Comment: Positive for toxigenic C. difficile with little to no toxin production. Only treat if clinical presentation suggests symptomatic illness. Performed at [facility], [address] [place], [place] [address]     Radiology Studies: DG Chest [place] 1 View  Result Date: 02/07/2023 CLINICAL DATA:  Questionable sepsis EXAM: PORTABLE CHEST 1 VIEW COMPARISON:  Chest x-ray dated October 17, 2022 FINDINGS: The heart size and mediastinal contours are within normal limits. Both lungs are clear. Numerous sclerotic osseous lesions, consistent with patient history of metastatic prostate cancer.  IMPRESSION: 1. Lungs are clear. 2. Numerous sclerotic osseous lesions, consistent with patient history of metastatic prostate cancer. Electronically Signed   By: Yetta Glassman M.D.   On: 02/07/2023 19:32    Scheduled Meds:  apalutamide  240 mg Oral Daily   feeding supplement  237 mL Oral TID BM   insulin aspart  0-9 Units Subcutaneous TID WC   multivitamin with minerals  1 tablet Oral Daily   rosuvastatin  10 mg Oral Daily   vancomycin  125 mg Oral QID   Continuous Infusions:  ceFEPime (MAXIPIME) IV 2 g (02/09/23 0130)   sodium phosphate 30 mmol in dextrose 5 % 250 mL infusion     vancomycin       LOS: 2 days    Time spent: 35 mins    Duard Brady, MD [facility]   If 7PM-7AM, please contact night-coverage

## 2023-02-10 DIAGNOSIS — R531 Weakness: Secondary | ICD-10-CM | POA: Diagnosis not present

## 2023-02-10 LAB — GLUCOSE, CAPILLARY
Glucose-Capillary: 93 mg/dL (ref 70–99)
Glucose-Capillary: 93 mg/dL (ref 70–99)
Glucose-Capillary: 88 mg/dL (ref 70–99)

## 2023-02-10 LAB — COMPREHENSIVE METABOLIC PANEL
ALT: 19 U/L (ref 0–44)
AST: 104 U/L — ABNORMAL HIGH (ref 15–41)
Albumin: 1.6 g/dL — ABNORMAL LOW (ref 3.5–5.0)
Alkaline Phosphatase: 289 U/L — ABNORMAL HIGH (ref 38–126)
Anion gap: 4 — ABNORMAL LOW (ref 5–15)
BUN: 14 mg/dL (ref 8–23)
CO2: 18 mmol/L — ABNORMAL LOW (ref 22–32)
Calcium: 7.3 mg/dL — ABNORMAL LOW (ref 8.9–10.3)
Chloride: 110 mmol/L (ref 98–111)
Creatinine, Ser: 0.85 mg/dL (ref 0.61–1.24)
GFR, Estimated: >60 mL/min (ref >60)
Glucose, Bld: 85 mg/dL (ref 70–99)
Potassium: 3.1 mmol/L — ABNORMAL LOW (ref 3.5–5.1)
Sodium: 132 mmol/L — ABNORMAL LOW (ref 135–145)
Total Bilirubin: 2.8 mg/dL — ABNORMAL HIGH (ref 0.3–1.2)
Total Protein: 4.9 g/dL — ABNORMAL LOW (ref 6.5–8.1)

## 2023-02-10 LAB — CBC
HCT: 23.4 % — ABNORMAL LOW (ref 39.0–52.0)
Hemoglobin: 7.9 g/dL — ABNORMAL LOW (ref 13.0–17.0)
MCH: 30.9 pg (ref 26.0–34.0)
MCHC: 33.8 g/dL (ref 30.0–36.0)
MCV: 91.4 fL (ref 80.0–100.0)
Platelets: 191 10*3/uL (ref 150–400)
RBC: 2.56 MIL/uL — ABNORMAL LOW (ref 4.22–5.81)
RDW: 18 % — ABNORMAL HIGH (ref 11.5–15.5)
WBC: 16.9 10*3/uL — ABNORMAL HIGH (ref 4.0–10.5)
nRBC: 0 % (ref 0.0–0.2)

## 2023-02-10 LAB — PHOSPHORUS: Phosphorus: 3.2 mg/dL (ref 2.5–4.6)

## 2023-02-10 LAB — MAGNESIUM: Magnesium: 1.9 mg/dL (ref 1.7–2.4)

## 2023-02-10 MED ORDER — POTASSIUM CHLORIDE 20 MEQ PO PACK
40.0000 meq | PACK | Freq: Once | ORAL | Status: AC
  Administered 2023-02-10: 40 meq via ORAL
  Filled 2023-02-10: qty 2

## 2023-02-10 MED ORDER — VANCOMYCIN HCL 125 MG PO CAPS
125.0000 mg | ORAL_CAPSULE | Freq: Four times a day (QID) | ORAL | Status: DC
  Administered 2023-02-10 – 2023-02-11 (×6): 125 mg via ORAL
  Filled 2023-02-10 (×7): qty 1

## 2023-02-10 MED ORDER — CEPHALEXIN 500 MG PO CAPS
500.0000 mg | ORAL_CAPSULE | Freq: Three times a day (TID) | ORAL | Status: DC
  Administered 2023-02-10 – 2023-02-11 (×4): 500 mg via ORAL
  Filled 2023-02-10 (×4): qty 1

## 2023-02-10 NOTE — Evaluation (Signed)
Occupational Therapy Evaluation Patient Details Name: Roger [name] MRN: [id number] DOB: [birth year] Today's Date: 02/10/2023   History of Present Illness Patient is a 67 year old male with generalized weakness for about a week. Found to have Severe sepsis secondary to UTI, C-diff +. History of metastatic prostate cancer, anemia   Clinical Impression   Patient agreeable to OT evaluation. Pt presenting with decreased independence in self care, balance, functional mobility/transfers, and endurance. PTA pt lived alone, was Mod I for ADLs (sponge bathes, uses BSC for toileting), and received assistance for IADLs as needed. Pt currently functioning at supervision for bed mobility, Mod A for simulated toilet transfer, and Min A to take several lateral steps at EOB using RW. He required set up-supervision for seated grooming and LB dressing tasks. Pt will benefit from acute OT to increase overall independence in the areas of ADLs and functional mobility in order to safely discharge to next venue of care. OT recommends ongoing therapy upon discharge to maximize safety and independence with ADLs, decrease fall risk, decrease caregiver burden, and promote return to PLOF.     Recommendations for follow up therapy are one component of a multi-disciplinary discharge planning process, led by the attending physician.  Recommendations may be updated based on patient status, additional functional criteria and insurance authorization.   Follow Up Recommendations  Home health OT     Assistance Recommended at Discharge Intermittent Supervision/Assistance  Patient can return home with the following A little help with walking and/or transfers;A little help with bathing/dressing/bathroom;Assistance with cooking/housework;Assist for transportation;Help with stairs or ramp for entrance    Functional Status Assessment  Patient has had a recent decline in their functional status and demonstrates the ability to make  significant improvements in function in a reasonable and predictable amount of time.  Equipment Recommendations  None recommended by OT    Recommendations for Other Services       Precautions / Restrictions Precautions Precautions: Fall Restrictions Weight Bearing Restrictions: No      Mobility Bed Mobility Overal bed mobility: Needs Assistance Bed Mobility: Supine to Sit, Sit to Supine     Supine to sit: Supervision, HOB elevated Sit to supine: Supervision, HOB elevated   General bed mobility comments: VCs for technique, increased time/effort required    Transfers Overall transfer level: Needs assistance Equipment used: Rolling walker (2 wheels) Transfers: Sit to/from Stand Sit to Stand: Mod assist, From elevated surface                  Balance Overall balance assessment: Needs assistance Sitting-balance support: Feet supported Sitting balance-Leahy Scale: Good     Standing balance support: Bilateral upper extremity supported, Reliant on assistive device for balance Standing balance-Leahy Scale: Poor                             ADL either performed or assessed with clinical judgement   ADL Overall ADL's : Needs assistance/impaired     Grooming: Set up;Sitting;Wash/dry face               Lower Body Dressing: Set up;Sitting/lateral leans Lower Body Dressing Details (indicate cue type and reason): socks Toilet Transfer: Rolling walker (2 wheels);Moderate assistance Toilet Transfer Details (indicate cue type and reason): simulated         Functional mobility during ADLs: Minimal assistance;Rolling walker (2 wheels) (to take ~4 lateral steps at EOB)       Vision Patient Visual  Report: No change from baseline       Perception     Praxis      Pertinent Vitals/Pain Pain Assessment Pain Assessment: Faces Faces Pain Scale: Hurts a little bit Pain Location: R knee, L hand Pain Descriptors / Indicators: Sore Pain  Intervention(s): Limited activity within patient's tolerance, Monitored during session, Repositioned     Hand Dominance Right   Extremity/Trunk Assessment Upper Extremity Assessment Upper Extremity Assessment: Generalized weakness;LUE deficits/detail LUE Deficits / Details: wrapped in ace bandage - pt endorsed doing this himself 2/2 gout, difficulty holding onto RW with this hand 2/2 pain   Lower Extremity Assessment Lower Extremity Assessment: Generalized weakness       Communication Communication Communication: No difficulties   Cognition Arousal/Alertness: Awake/alert Behavior During Therapy: WFL for tasks assessed/performed Overall Cognitive Status: Within Functional Limits for tasks assessed             General Comments       Exercises Other Exercises Other Exercises: OT provided education re: role of OT, OT POC, post acute recs, sitting up for all meals, EOB/OOB mobility with assistance, home/fall safety.     Shoulder Instructions      Home Living Family/patient expects to be discharged to:: Private residence Living Arrangements: Alone Available Help at Discharge: Available PRN/intermittently;Family (cousin checks in on daily) Type of Home: Mobile home Home Access: Ramped entrance     Home Layout: One level     Bathroom Shower/Tub: Sponge bathes at baseline         Home Equipment: Conservation officer, nature (2 wheels);Crutches;Wheelchair - manual;BSC/3in1   Additional Comments: does not use the wheelchair in the bathroom, uses a BSC. sleeps on the couch      Prior Functioning/Environment Prior Level of Function : Independent/Modified Independent             Mobility Comments: uses wheelchair as primary means of mobility. can "take a few steps" with crutches if someone is in the home ADLs Comments: Mod I for ADLs, takes sponge baths at baseline. Receives assistance for IADLs as needed (family delivers groceries, does laundry, and will cook for pt if needed).  Pt is IND with light meal prep. Pt reports RN from palliative services (?) comes to his house & organizes meds in pillbox. Medical transport to appointments.        OT Problem List: Pain;Decreased strength;Decreased range of motion;Decreased activity tolerance;Impaired balance (sitting and/or standing)      OT Treatment/Interventions: Self-care/ADL training;Therapeutic exercise;Neuromuscular education;Energy conservation;DME and/or AE instruction;Manual therapy;Modalities;Balance training;Patient/family education;Visual/perceptual remediation/compensation;Cognitive remediation/compensation;Therapeutic activities;Splinting    OT Goals(Current goals can be found in the care plan section) Acute Rehab OT Goals Patient Stated Goal: return home OT Goal Formulation: With patient Time For Goal Achievement: 02/24/23 Potential to Achieve Goals: Fair   OT Frequency: Min 2X/week    Co-evaluation              AM-PAC OT "6 Clicks" Daily Activity     Outcome Measure Help from another person eating meals?: A Little Help from another person taking care of personal grooming?: A Little Help from another person toileting, which includes using toliet, bedpan, or urinal?: A Lot Help from another person bathing (including washing, rinsing, drying)?: A Lot Help from another person to put on and taking off regular upper body clothing?: A Little Help from another person to put on and taking off regular lower body clothing?: A Lot 6 Click Score: 15   End of Session Equipment Utilized During Treatment:  Gait belt;Rolling walker (2 wheels) Nurse Communication: Mobility status  Activity Tolerance: Patient tolerated treatment well Patient left: in bed;with call bell/phone within reach;with bed alarm set  OT Visit Diagnosis: Unsteadiness on feet (R26.81);Muscle weakness (generalized) (M62.81);Pain                Time: [id number] OT Time Calculation (min): 25 min Charges:  OT General Charges $OT Visit: 1  Visit OT Evaluation $OT Eval Low Complexity: 1 Low  [facility] MS, OTR/L ascom [phone]  02/10/23, 2:59 PM

## 2023-02-10 NOTE — Evaluation (Addendum)
Physical Therapy Evaluation Patient Details Name: Roger [name] MRN: [id number] DOB: February 15, 1956 Today's Date: 02/10/2023  History of Present Illness  Patient is a 67 year old male with generalized weakness for about a week. Found to have Severe sepsis secondary to UTI, C-diff +. History of metastatic prostate cancer, anemia  Clinical Impression  Patient is agreeable to PT. He reports he lives alone in a mobile home with a ramped entrance and is primarily at the wheelchair level for mobility. He sleeps on a couch and reports sponge bathing and using a bed side commode at home.  The patient has generalized weakness today. He required physical assistance for standing with bed height elevated. 2 bouts of standing performed. Unable to stand longer than 20 seconds. He is able to perform bed mobility without physical assistance. The patient is hopeful to discharge home with support from family. He reports having grandsons that will be able to assist him at home as well. Anticipate the patient will need intermittent supervision/assistance at discharge. PT will continue to follow to maximize independence and facilitate return to prior level of function.      Recommendations for follow up therapy are one component of a multi-disciplinary discharge planning process, led by the attending physician.  Recommendations may be updated based on patient status, additional functional criteria and insurance authorization.  Follow Up Recommendations Home health PT      Assistance Recommended at Discharge Intermittent Supervision/Assistance  Patient can return home with the following  A little help with walking and/or transfers;A little help with bathing/dressing/bathroom;Assist for transportation;Help with stairs or ramp for entrance;Assistance with cooking/housework    Equipment Recommendations None recommended by PT  Recommendations for Other Services       Functional Status Assessment Patient has had a  recent decline in their functional status and demonstrates the ability to make significant improvements in function in a reasonable and predictable amount of time.     Precautions / Restrictions Precautions Precautions: Fall Restrictions Weight Bearing Restrictions: No      Mobility  Bed Mobility Overal bed mobility: Needs Assistance Bed Mobility: Supine to Sit, Sit to Supine     Supine to sit: Supervision, HOB elevated Sit to supine: Supervision, HOB elevated   General bed mobility comments: verbal cues for technique. increased time and effort required    Transfers Overall transfer level: Needs assistance Equipment used: Rolling walker (2 wheels) Transfers: Sit to/from Stand Sit to Stand: Mod assist, From elevated surface           General transfer comment: bed height elevated significantly to stand. 2 bouts of standing performed with lifting assistance required. decreased eccentric control    Ambulation/Gait               General Gait Details: not attemoted due to generalized LE weakness and limited standing tolerance  Stairs            Wheelchair Mobility    Modified Rankin (Stroke Patients Only)       Balance Overall balance assessment: Needs assistance Sitting-balance support: Feet supported Sitting balance-Leahy Scale: Good     Standing balance support: Bilateral upper extremity supported, Reliant on assistive device for balance Standing balance-Leahy Scale: Poor Standing balance comment: external support required initially, progressing to only needing rolling walker for support in standing. standing tolerance no more than 20 seconds  Pertinent Vitals/Pain Pain Assessment Pain Assessment: Faces Faces Pain Scale: Hurts a little bit Pain Location: R knee, left index finger Pain Descriptors / Indicators: Sore Pain Intervention(s): Monitored during session, Limited activity within patient's tolerance,  Repositioned    Home Living Family/patient expects to be discharged to:: Private residence Living Arrangements: Alone Available Help at Discharge: Available PRN/intermittently;Family (cousin checks in on daily) Type of Home: Mobile home Home Access: Ramped entrance       Home Layout: One level Home Equipment: Conservation officer, nature (2 wheels);Crutches;Wheelchair - manual;BSC/3in1 Additional Comments: does not use the wheelchair in the bathroom, uses a bed side commode. sleeps on the couch    Prior Function Prior Level of Function : Independent/Modified Independent             Mobility Comments: uses wheelchair as primary means of mobility. can "take a few steps" with crutches if someone is in the home ADLs Comments: sponge bath at baseline     Hand Dominance        Extremity/Trunk Assessment   Upper Extremity Assessment Upper Extremity Assessment: Generalized weakness    Lower Extremity Assessment Lower Extremity Assessment: Generalized weakness       Communication   Communication: No difficulties  Cognition Arousal/Alertness: Awake/alert Behavior During Therapy: WFL for tasks assessed/performed Overall Cognitive Status: Within Functional Limits for tasks assessed                                          General Comments      Exercises     Assessment/Plan    PT Assessment Patient needs continued PT services  PT Problem List Decreased strength;Decreased range of motion;Decreased activity tolerance;Decreased balance;Decreased mobility;Decreased safety awareness       PT Treatment Interventions DME instruction;Gait training;Functional mobility training;Therapeutic activities;Therapeutic exercise;Balance training;Neuromuscular re-education;Patient/family education;Wheelchair mobility training;Cognitive remediation    PT Goals (Current goals can be found in the Care Plan section)  Acute Rehab PT Goals Patient Stated Goal: to go home PT Goal  Formulation: With patient Time For Goal Achievement: 02/24/23 Potential to Achieve Goals: Fair    Frequency Min 2X/week     Co-evaluation               AM-PAC PT "6 Clicks" Mobility  Outcome Measure Help needed turning from your back to your side while in a flat bed without using bedrails?: None Help needed moving from lying on your back to sitting on the side of a flat bed without using bedrails?: A Little Help needed moving to and from a bed to a chair (including a wheelchair)?: A Lot Help needed standing up from a chair using your arms (e.g., wheelchair or bedside chair)?: A Lot Help needed to walk in hospital room?: Total Help needed climbing 3-5 steps with a railing? : Total 6 Click Score: 13    End of Session   Activity Tolerance: Patient tolerated treatment well Patient left: in bed;with call bell/phone within reach;with bed alarm set Nurse Communication: Mobility status PT Visit Diagnosis: Unsteadiness on feet (R26.81);Muscle weakness (generalized) (M62.81)    Time: 1030-1057 PT Time Calculation (min) (ACUTE ONLY): 27 min   Charges:   PT Evaluation $PT Eval Low Complexity: 1 Low PT Treatments $Therapeutic Activity: 8-22 mins       Minna Merritts, PT, MPT   Percell Locus 02/10/2023, 11:18 AM

## 2023-02-10 NOTE — Progress Notes (Addendum)
PROGRESS NOTE    Roger [name]  [id number] DOB: 11/14/1956 DOA: 02/07/2023 PCP: Lahoma Rocker, MD   Brief Narrative:  This 67 yrs old Male with PMH significant for diabetes mellitus, hypertension, morbid obesity, prostate cancer, thrombocytopenia presented in the ED with generalized weakness for about a week.  Patient has history of metastatic prostate cancer and on arrival he was found to be febrile with a temp of 100.4.  Patient is following up with urology and the Foley catheter was removed 2 days ago with the plans for measuring PVR.  Patient meet sepsis criteria on arrival and  started on empiric antibiotics( vancomycin and cefepime ).  Initial lactic acid 2.0, UA nitrites+, LE+, stool for C. Difficile+, Patient is admitted for further evaluation.  Assessment & Plan:   Principal Problem:   Generalized weakness Active Problems:   Alcohol use   Folate deficiency   Severe sepsis secondary to UTI ([place])   AKI (acute kidney injury) ([place])   Indwelling Foley catheter present   Prostate cancer metastatic to bone ([facility])   Anemia, chronic disease   Essential hypertension   Diabetes mellitus without complication ([facility])   Diarrhea   Malnutrition of moderate degree  Generalized weakness: Patient presented with generalized weakness could be multifactorial. It could be due to sepsis secondary to urinary tract infection and dehydration. Continue IV fluid resuscitation and empiric antibiotics. PT and OT evaluation > SNF Continue fall precautions.  Severe sepsis secondary to UTI: Patient met sepsis criteria (tachycardia, fever, tachypnea, lactic acid 2.0, UA positive for UTI).   Continue IV fluid resuscitation and empiric IV antibiotics. Blood culture NGTD, urine cultures multiple species contaminated. De-escalate antibiotics to Keflex and p.o. vancomycin.  Acute kidney injury: > Resolved. Baseline serum creatinine 1.0, creatinine on arrival 1.38. Likely in the setting of sepsis,  dehydration, post-obstructive symptoms. Insert Foley catheter for urinary retention. Continue IV fluid resuscitation.  Avoid nephrotoxic medications. AKI resolved, serum creatinine improved.  Metastatic prostate cancer: Patient follows up with urology. Continue patient on home regimen of Erleada. Outpatient oncology follow up.  Acute on chronic normocytic normochromic anemia: Baseline hemoglobin remained between 9 and 10. Presented with hemoglobin 6.0.  There is no any obvious visible bleeding S/p 2 unit PRBC.  Hemoglobin 9.3> 7.9 Obtain stool for occult blood.  Essential hypertension: Continue Imdur and lisinopril. Continue IV fluid resuscitation.  Diabetes mellitus: Carb modified diet. Hold metformin due to AKI. Last hemoglobin A1c 6.5. Continue regular insulin sliding scale.  Alcohol use: Patient does have a history of alcohol abuse.   Continue CIWA protocol.  C. Difficile+: Patient reports having diarrhea.  Stool for C. difficile positive Continue p.o. vancomycin 125 mg po q6hr  Hypomagnesemia/hypokalemia: Replaced.  Continue to monitor.  Leukocytosis: Likely elevated due to C. difficile and UTI. Continue antibiotics and monitor WBC.  DVT prophylaxis: SCDs Code Status: Full code Family Communication: No family at bed side. Disposition Plan:     Status is: Inpatient Remains inpatient appropriate because: Admitted for multiple problems.  Sepsis secondary to UTI, acute kidney injury, C. difficile positive, anemia requiring blood transfusion, PT and OT recommended HHS.    Consultants:  None  Procedures: None  Antimicrobials:  Anti-infectives (From admission, onward)    Start     Dose/Rate Route Frequency Ordered Stop   02/10/23 1200  cephALEXin (KEFLEX) capsule 500 mg        500 mg Oral Every 8 hours 02/10/23 0936 02/15/23 1359   02/10/23 1030  vancomycin (VANCOCIN) capsule 125 mg  125 mg Oral 4 times daily 02/10/23 0935 02/18/23 0959   02/09/23  2200  vancomycin (VANCOCIN) IVPB 1000 mg/200 mL premix  Status:  Discontinued        1,000 mg 200 mL/hr over 60 Minutes Intravenous Every 12 hours 02/09/23 0857 02/10/23 0912   02/09/23 0200  vancomycin (VANCOREADY) IVPB 1500 mg/300 mL  Status:  Discontinued        1,500 mg 150 mL/hr over 120 Minutes Intravenous Every 24 hours 02/08/23 0106 02/09/23 0857   02/08/23 1700  ceFEPIme (MAXIPIME) 2 g in sodium chloride 0.9 % 100 mL IVPB  Status:  Discontinued        2 g 200 mL/hr over 30 Minutes Intravenous Every 8 hours 02/08/23 1422 02/10/23 0912   02/08/23 1530  vancomycin (VANCOCIN) capsule 125 mg  Status:  Discontinued        125 mg Oral 4 times daily 02/08/23 1430 02/10/23 0912   02/08/23 0800  ceFEPIme (MAXIPIME) 2 g in sodium chloride 0.9 % 100 mL IVPB  Status:  Discontinued        2 g 200 mL/hr over 30 Minutes Intravenous Every 12 hours 02/08/23 0107 02/08/23 1422   02/08/23 0115  vancomycin (VANCOREADY) IVPB 1750 mg/350 mL        1,750 mg 175 mL/hr over 120 Minutes Intravenous  Once 02/08/23 0101 02/08/23 0432   02/07/23 1915  ceFEPIme (MAXIPIME) 2 g in sodium chloride 0.9 % 100 mL IVPB        2 g 200 mL/hr over 30 Minutes Intravenous  Once 02/07/23 1907 02/07/23 2026       Subjective: Patient was seen and examined at bedside.  Overnight events noted. Patient reports feeling better, reports diarrhea has improved. He denies any shortness of breath, dizziness, palpitations.  Objective: Vitals:   02/09/23 1916 02/10/23 0006 02/10/23 0356 02/10/23 0847  BP: 109/70 103/68 127/81 113/76  Pulse: 77 83 79 73  Resp:  20 18 16   Temp: 98.1 F (36.7 C) 98.9 F (37.2 C) 98.1 F (36.7 C) 97.9 F (36.6 C)  TempSrc: Oral  Oral Oral  SpO2: 100% 99% 100% 100%  Weight:      Height:        Intake/Output Summary (Last 24 hours) at 02/10/2023 1126 Last data filed at 02/10/2023 0420 Gross per 24 hour  Intake 584.2 ml  Output 2100 ml  Net -1515.8 ml   Filed Weights   02/07/23 1905   Weight: 79.6 kg    Examination:  General exam: Appears comfortable, not in any acute distress.  Deconditioned Respiratory system: CTA bilaterally, respiratory effort normal, RR 14. Cardiovascular system: S1 & S2 heard, regular rate and rhythm, no murmur. Gastrointestinal system: Abdomen is soft, non tender, non distended, BS+ Central nervous system: Alert and oriented x 3. No focal neurological deficits. Extremities: No edema, no cyanosis, no clubbing Skin: No rashes, lesions or ulcers Psychiatry: Judgement and insight appear normal. Mood & affect appropriate.     Data Reviewed: I have personally reviewed following labs and imaging studies  CBC: Recent Labs  Lab 02/07/23 1919 02/08/23 0016 02/08/23 0416 02/09/23 0610 02/10/23 0510  WBC 20.4*  --  21.5* 22.5* 16.9*  NEUTROABS 17.0*  --  17.6*  --   --   HGB 7.2* 6.0* 5.7* 9.3* 7.9*  HCT 24.7* 19.1* 17.0* 27.7* 23.4*  MCV 107.9*  --  97.1 93.3 91.4  PLT 241  --  194 230 [id number]   Basic Metabolic Panel: Recent Labs  Lab 02/07/23 1919 02/08/23 0016 02/08/23 0416 02/09/23 0610 02/10/23 0510  NA 133*  --  134* 131* 132*  K 2.5*  --  2.7* 3.7 3.1*  CL 100  --  106 109 110  CO2 20*  --  20* 17* 18*  GLUCOSE 92  --  86 84 85  BUN 19  --  17 14 14   CREATININE 1.38*  --  1.02 0.94 0.85  CALCIUM 6.9*  --  6.5* 7.8* 7.3*  MG  --  1.2* 1.3* 1.9 1.9  PHOS  --   --  2.3* 2.3* 3.2   GFR: Estimated Creatinine Clearance: 90.7 mL/min (by C-G formula based on SCr of 0.85 mg/dL). Liver Function Tests: Recent Labs  Lab 02/07/23 1919 02/08/23 0416 02/10/23 0510  AST 18 11* 104*  ALT 6 5 19   ALKPHOS 165* 128* 289*  BILITOT 1.9* 1.4* 2.8*  PROT 5.7* 4.7* 4.9*  ALBUMIN 2.1* 1.6* 1.6*   No results for input(s): "LIPASE", "AMYLASE" in the last 168 hours. No results for input(s): "AMMONIA" in the last 168 hours. Coagulation Profile: Recent Labs  Lab 02/07/23 2111  INR 1.3*   Cardiac Enzymes: No results for input(s):  "CKTOTAL", "CKMB", "CKMBINDEX", "TROPONINI" in the last 168 hours. BNP (last 3 results) No results for input(s): "PROBNP" in the last 8760 hours. HbA1C: No results for input(s): "HGBA1C" in the last 72 hours. CBG: Recent Labs  Lab 02/08/23 2008 02/09/23 1137 02/09/23 1659 02/09/23 2022 02/10/23 0848  GLUCAP 83 74 92 97 93   Lipid Profile: No results for input(s): "CHOL", "HDL", "LDLCALC", "TRIG", "CHOLHDL", "LDLDIRECT" in the last 72 hours. Thyroid Function Tests: No results for input(s): "TSH", "T4TOTAL", "FREET4", "T3FREE", "THYROIDAB" in the last 72 hours. Anemia Panel: No results for input(s): "VITAMINB12", "FOLATE", "FERRITIN", "TIBC", "IRON", "RETICCTPCT" in the last 72 hours. Sepsis Labs: Recent Labs  Lab 02/07/23 1939 02/07/23 2218  LATICACIDVEN 2.0* 2.2*    Recent Results (from the past 240 hour(s))  Resp panel by RT-PCR (RSV, Flu A&B, Covid) Anterior Nasal Swab     Status: None   Collection Time: 02/07/23  7:17 PM   Specimen: Anterior Nasal Swab  Result Value Ref Range Status   SARS Coronavirus 2 by RT PCR NEGATIVE NEGATIVE Final    Comment: (NOTE) SARS-CoV-2 target nucleic acids are NOT DETECTED.  The SARS-CoV-2 RNA is generally detectable in upper respiratory specimens during the acute phase of infection. The lowest concentration of SARS-CoV-2 viral copies this assay can detect is 138 copies/mL. A negative result does not preclude SARS-Cov-2 infection and should not be used as the sole basis for treatment or other patient management decisions. A negative result may occur with  improper specimen collection/handling, submission of specimen other than nasopharyngeal swab, presence of viral mutation(s) within the areas targeted by this assay, and inadequate number of viral copies(<138 copies/mL). A negative result must be combined with clinical observations, patient history, and epidemiological information. The expected result is Negative.  Fact Sheet for  Patients:  EntrepreneurPulse.com.au  Fact Sheet for Healthcare Providers:  IncredibleEmployment.be  This test is no t yet approved or cleared by the [place] FDA and  has been authorized for detection and/or diagnosis of SARS-CoV-2 by FDA under an Emergency Use Authorization (EUA). This EUA will remain  in effect (meaning this test can be used) for the duration of the COVID-19 declaration under Section 564(b)(1) of the Act, 21 U.S.C.section 360bbb-3(b)(1), unless the authorization is terminated  or revoked sooner.  Influenza A by PCR NEGATIVE NEGATIVE Final   Influenza B by PCR NEGATIVE NEGATIVE Final    Comment: (NOTE) The Xpert Xpress SARS-CoV-2/FLU/RSV plus assay is intended as an aid in the diagnosis of influenza from Nasopharyngeal swab specimens and should not be used as a sole basis for treatment. Nasal washings and aspirates are unacceptable for Xpert Xpress SARS-CoV-2/FLU/RSV testing.  Fact Sheet for Patients: EntrepreneurPulse.com.au  Fact Sheet for Healthcare Providers: IncredibleEmployment.be  This test is not yet approved or cleared by the [place] FDA and has been authorized for detection and/or diagnosis of SARS-CoV-2 by FDA under an Emergency Use Authorization (EUA). This EUA will remain in effect (meaning this test can be used) for the duration of the COVID-19 declaration under Section 564(b)(1) of the Act, 21 U.S.C. section 360bbb-3(b)(1), unless the authorization is terminated or revoked.     Resp Syncytial Virus by PCR NEGATIVE NEGATIVE Final    Comment: (NOTE) Fact Sheet for Patients: EntrepreneurPulse.com.au  Fact Sheet for Healthcare Providers: IncredibleEmployment.be  This test is not yet approved or cleared by the [place] FDA and has been authorized for detection and/or diagnosis of SARS-CoV-2 by FDA under an Emergency  Use Authorization (EUA). This EUA will remain in effect (meaning this test can be used) for the duration of the COVID-19 declaration under Section 564(b)(1) of the Act, 21 U.S.C. section 360bbb-3(b)(1), unless the authorization is terminated or revoked.  Performed at [facility], [address] [place], [place] [address]   Blood Culture (routine x 2)     Status: None (Preliminary result)   Collection Time: 02/07/23  7:19 PM   Specimen: BLOOD  Result Value Ref Range Status   Specimen Description BLOOD BLOOD LEFT ARM  Final   Special Requests   Final    BOTTLES DRAWN AEROBIC AND ANAEROBIC Blood Culture results may not be optimal due to an inadequate volume of blood received in culture bottles   Culture   Final    NO GROWTH 3 DAYS Performed at [facility], [address], [place], [place] [address]    Report Status PENDING  Incomplete  Blood Culture (routine x 2)     Status: None (Preliminary result)   Collection Time: 02/07/23  8:16 PM   Specimen: BLOOD  Result Value Ref Range Status   Specimen Description BLOOD BLOOD LEFT ARM  Final   Special Requests   Final    BOTTLES DRAWN AEROBIC AND ANAEROBIC Blood Culture adequate volume   Culture   Final    NO GROWTH 3 DAYS Performed at [facility], [address], [place], [place] [address]    Report Status PENDING  Incomplete  Urine Culture     Status: Abnormal   Collection Time: 02/07/23  9:11 PM   Specimen: Urine, Clean Catch  Result Value Ref Range Status   Specimen Description   Final    URINE, CLEAN CATCH Performed at [facility], [place] [address], [place], [place] [address]    Special Requests   Final    NONE Reflexed from [id number] Performed at [facility], [address] [place], [place] [address]    Culture (A)  Final    20,000 COLONIES/mL MULTIPLE SPECIES PRESENT, SUGGEST RECOLLECTION   Report Status 02/09/2023 FINAL  Final  Gastrointestinal Panel by PCR , Stool      Status: None   Collection Time: 02/08/23  1:17 AM   Specimen: STOOL  Result Value Ref Range Status   Campylobacter  species NOT DETECTED NOT DETECTED Final   Plesimonas shigelloides NOT DETECTED NOT DETECTED Final   Salmonella species NOT DETECTED NOT DETECTED Final   Yersinia enterocolitica NOT DETECTED NOT DETECTED Final   Vibrio species NOT DETECTED NOT DETECTED Final   Vibrio cholerae NOT DETECTED NOT DETECTED Final   Enteroaggregative E coli (EAEC) NOT DETECTED NOT DETECTED Final   Enteropathogenic E coli (EPEC) NOT DETECTED NOT DETECTED Final   Enterotoxigenic E coli (ETEC) NOT DETECTED NOT DETECTED Final   Shiga like toxin producing E coli (STEC) NOT DETECTED NOT DETECTED Final   Shigella/Enteroinvasive E coli (EIEC) NOT DETECTED NOT DETECTED Final   Cryptosporidium NOT DETECTED NOT DETECTED Final   Cyclospora cayetanensis NOT DETECTED NOT DETECTED Final   Entamoeba histolytica NOT DETECTED NOT DETECTED Final   Giardia lamblia NOT DETECTED NOT DETECTED Final   Adenovirus F40/41 NOT DETECTED NOT DETECTED Final   Astrovirus NOT DETECTED NOT DETECTED Final   Norovirus GI/GII NOT DETECTED NOT DETECTED Final   Rotavirus A NOT DETECTED NOT DETECTED Final   Sapovirus (I, II, IV, and V) NOT DETECTED NOT DETECTED Final    Comment: Performed at [facility], [address] [place], [place] [address]  C Difficile Quick Screen w PCR reflex     Status: Abnormal   Collection Time: 02/08/23  1:17 AM   Specimen: STOOL  Result Value Ref Range Status   C Diff antigen POSITIVE (A) NEGATIVE Final   C Diff toxin NEGATIVE NEGATIVE Final   C Diff interpretation Results are indeterminate. See PCR results.  Final    Comment: Performed at [facility], [address] [place], [place] [address]  C. Diff by PCR, Reflexed     Status: Abnormal   Collection Time: 02/08/23  1:17 AM  Result Value Ref Range Status   Toxigenic C. Difficile by PCR POSITIVE (A) NEGATIVE Final    Comment:  Positive for toxigenic C. difficile with little to no toxin production. Only treat if clinical presentation suggests symptomatic illness. Performed at [facility], [address], [place], [place] [address]     Radiology Studies: No results found.  Scheduled Meds:  apalutamide  240 mg Oral Daily   cephALEXin  500 mg Oral Q8H   feeding supplement  237 mL Oral TID BM   insulin aspart  0-9 Units Subcutaneous TID WC   isosorbide mononitrate  30 mg Oral Daily   lisinopril  5 mg Oral Daily   multivitamin with minerals  1 tablet Oral Daily   rosuvastatin  10 mg Oral Daily   vancomycin  125 mg Oral QID   Continuous Infusions:   LOS: 3 days    Time spent: 35 mins  Duard Brady, MD [facility]   If 7PM-7AM, please contact night-coverage

## 2023-02-11 DIAGNOSIS — R531 Weakness: Secondary | ICD-10-CM | POA: Diagnosis not present

## 2023-02-11 LAB — BASIC METABOLIC PANEL
Anion gap: 6 (ref 5–15)
BUN: 12 mg/dL (ref 8–23)
CO2: 19 mmol/L — ABNORMAL LOW (ref 22–32)
Calcium: 7.7 mg/dL — ABNORMAL LOW (ref 8.9–10.3)
Chloride: 111 mmol/L (ref 98–111)
Creatinine, Ser: 0.86 mg/dL (ref 0.61–1.24)
GFR, Estimated: >60 mL/min (ref >60)
Glucose, Bld: 93 mg/dL (ref 70–99)
Potassium: 3.5 mmol/L (ref 3.5–5.1)
Sodium: 136 mmol/L (ref 135–145)

## 2023-02-11 LAB — HEMOGLOBIN AND HEMATOCRIT, BLOOD
HCT: 24.5 % — ABNORMAL LOW (ref 39.0–52.0)
Hemoglobin: 8.2 g/dL — ABNORMAL LOW (ref 13.0–17.0)

## 2023-02-11 LAB — GLUCOSE, CAPILLARY
Glucose-Capillary: 81 mg/dL (ref 70–99)
Glucose-Capillary: 79 mg/dL (ref 70–99)
Glucose-Capillary: 83 mg/dL (ref 70–99)

## 2023-02-11 LAB — PHOSPHORUS: Phosphorus: 2.7 mg/dL (ref 2.5–4.6)

## 2023-02-11 LAB — MAGNESIUM: Magnesium: 1.8 mg/dL (ref 1.7–2.4)

## 2023-02-11 MED ORDER — LISINOPRIL 5 MG PO TABS: 5.0000 mg | ORAL_TABLET | Freq: Every day | ORAL | 1 refills | Status: DC

## 2023-02-11 MED ORDER — ISOSORBIDE MONONITRATE ER 30 MG PO TB24: ORAL_TABLET | ORAL | 1 refills | Status: DC

## 2023-02-11 MED ORDER — OXYCODONE HCL 7.5 MG PO TABS: 7.5000 mg | ORAL_TABLET | ORAL | 0 refills | Status: AC | PRN

## 2023-02-11 MED ORDER — ROSUVASTATIN CALCIUM 10 MG PO TABS: 10.0000 mg | ORAL_TABLET | Freq: Every day | ORAL | 1 refills | Status: DC

## 2023-02-11 MED ORDER — CEPHALEXIN 500 MG PO CAPS: 500.0000 mg | ORAL_CAPSULE | Freq: Three times a day (TID) | ORAL | 0 refills | Status: AC

## 2023-02-11 MED ORDER — VANCOMYCIN HCL 125 MG PO CAPS: 125.0000 mg | ORAL_CAPSULE | Freq: Four times a day (QID) | ORAL | 0 refills | Status: AC

## 2023-02-11 NOTE — TOC Transition Note (Addendum)
Transition of Care [facility]) - CM/SW Discharge Note   Patient Details  Name: Roger [name] MRN: [id number] Date of Birth: 01-Oct-1956  Transition of Care [facility]) CM/SW Contact:  Gerilyn Pilgrim, LCSW Phone Number: 02/11/2023, 11:08 AM   Clinical Narrative:   Pt discharging today. Pt reports he is using medical transport and transport through [facility]. Pt states he has family that can assist with medications at discharge. Pt interested in [facility] but does not have a PCP pt has an appt to follow up on 3/28 but does not have a provider currently. Pt wanting to know about HH copays. Message sent to bobby ashley with [facility] to verify. Pt has applied for medicaid and application is currently pending. Pt reported concern for food insecurity. Food and substance use resources added to AVS.  Pt to transport home via [facility]. CSW got confirmation of cop[facility] for [facility] for patient that it would be 130 a visit per discipline. Pt would like to decline HH at this time. CSW offered for Outpatient PT but pt declines this as well.   [facility] arranged. Pt is 5th in line.  3/18 Bobby with [facility] stated pt will not have copays if orders are edited to reflect home PT only. CSW messaged MD.         Patient Goals and CMS Choice      Discharge Placement                         Discharge Plan and Services Additional resources added to the After Visit Summary for                                       Social Determinants of Health (SDOH) Interventions SDOH Screenings   Food Insecurity: Food Insecurity Present (02/08/2023)  Housing: Low Risk  (02/08/2023)  Transportation Needs: Unmet Transportation Needs (02/08/2023)  Utilities: Not At Risk (02/08/2023)  Depression (PHQ2-9): Low Risk  (11/28/2022)  Financial Resource Strain: High Risk (11/28/2022)  Physical Activity: Inactive (11/28/2022)  Social Connections: Socially Isolated (11/30/2022)  Stress: Stress Concern Present (11/30/2022)  Tobacco  Use: Medium Risk (02/07/2023)     Readmission Risk Interventions     No data to display

## 2023-02-11 NOTE — Discharge Instructions (Signed)
Food Resources  Agency Name: [facility] Agency Address: [address], [place], [place] [address] Phone: [phone] Website: [email or web address]  Service(s) Offered: Housing services, self-sufficiency, congregate meal  program, weatherization program, Administrator, sports program, emergency food assistance,  housing counseling, home ownership program, wheels -towork program. Meals free for 60 and older at various  locations from 9am-1pm, Monday-Friday:  [employer], [place], [place], [address], [place]   [facility], [place] [phone] The [address], [address]   [place], [place]  Agency Name: [facility] Address: [place] [address] [place], [place], [place] [address] Phone: [phone] Website: [email or web address] Service(s) Offered: Home delivered hot, frozen, and emergency  meals. Grocery assistance program which matches  volunteers one-on-one with seniors unable to grocery shop  for themselves. Must be 60 years and older; less than 20  hours of in-home aide service, limited or no driving ability;  live alone or with someone with a disability; live in  [place].  Agency Name: [employer] ([place]) Address: [address] [place], [place] [address] Phone: [phone] Service(s) Offered: Food is served to [place], homeless, elderly, and low  income people in the community every Saturday (11:30  am-12:30 pm) and Sunday (12:30 pm-1:30pm). Volunteers  also offer help and encouragement in seeking employment,  and spiritual guidance. March 21, 2017 8  Agency Name: [employer] Services Address: [address] [place], [place] [address] Phone: [phone] Service(s) Offered: Child support services; child welfare services; food stamps;  Medicaid;  work first family assistance; and aid with fuel,  rent, food and medicine.  Agency Name: Chemical [employer] Address: [address], [place], [place] Phone: [phone] Website: [email or web address] Services Offered: Monday 10:00am-12:00, 8:00pm-9:00pm, and Friday  10:00am-12:00. Agency Name: [employer] [facility] [place] Address: [address] [place], [place] [address] Phone: [phone] Website: [email or web address] Service(s) Offered: Serves weekday meals, open from 11:30 am- 1:00 pm., and  6:30-7:30pm, Monday-Wednesday-Friday distributes food  3:30-6pm, Monday-Wednesday-Friday.  Agency Name: [employer] Address: [address] [place], [place] Phone: [phone] Website: [email or web address] Services Offered: Distributes food the 4th Saturday of the month, starting at  8:00 am Agency Name: [facility] Address: [id number] [place], [place] [address] Phone: [phone] Website: [email or web address] Service(s) Offered: Bread of life, weekly food pantry. Open Wednesdays from  10:00am-noon.  Agency Name: [employer] [facility] Address: [place], [place], [place] Phone: [phone] Services Offered: Distributes food 9am-1pm, Monday-Thursday. Call for details.   Agency Name: [place] Address: [address], [place], [place] [address] Phone: [phone] Website: [email or web address] Service(s) Offered: Youth worker. Call for assistance. Agency Name: [facility] [employer] [facility] Address: [address] [place], [place] [address] Phone: [phone] Service Offered: Emergency Food Pantry. Call for appointment.  Agency Name: [place] Address: [address], [place], [place] [address] Phone: [phone] Website: [email or web address] Services Offered: Youth worker. Call for details Agency Name: [facility] Address: [place], [place] Phone: [phone] Website: [email or web address] Service(s) Offered: Emergency Food Pantry. Call for details.  Agency Name: [employer] Address: [place]. [address], [place],  [address] Phone: [phone] or [phone] Website: [email or web address] Service(s) Offered: Distribute food 9am-11:30 am, Tuesday-Friday, and 1- 3:30pm, Monday-Friday. Food pantry Monday-Friday  1pm-3pm, fresh items, Mon.-Wed.-Fri.  Agency Name: [facility] Address: [phone]  [place], [place] [address] Phone: [phone] Website: [email or web address] Services Offered: Distribute food Tues and Sats from 9:00am-noon. Closed  1st Saturday of each month. Call for details    Intensive Outpatient Programs   [place] [address] [place],  [place], [place] (Inpatient and outpatient)[phone] (Suboxone and Methadone) [address] [phone]  ADS: Alcohol & Drug [facility] Programs - Intensive Outpatient [place] [phone] [place], [place], [place] (Outpatient, Inpatient, [facility] (Groups and Residental) (insurance only) [place], [place]   [facility] (for caregivers and family) [address]een [address] [place], [place]  Residential Treatment Programs  [facility] [facility](2 years) Residential: 79 days)[facility] ([place], [place], [place] or [phone]  D.R.E.A.M.S Treatment [facility] [place], [place], [place]  [place] (RTS) [place], [place], [place] Admissions: 8am-3pm M-F  [facility] Program: Residential Program [phone] Days)             ADATC: [facility]  [place], [place], [place] or [phone] in Hours over the weekend or by referral)   [place] Crisis: [facility]:[phone] (for crisis response 24 hours a day)

## 2023-02-11 NOTE — Discharge Summary (Signed)
Physician Discharge Summary  Roger [name] [id number] DOB: 1956/03/08 DOA: 02/07/2023  PCP: Lahoma Rocker, MD  Admit date: 02/07/2023  Discharge date: 02/11/2023  Admitted From: Home.  Disposition:  Home Health Services  Recommendations for Outpatient Follow-up:  Follow up with PCP in 1-2 weeks. Please obtain BMP/CBC in one week. Advised to follow-up with oncology as scheduled. Advised to take Keflex 500 mg twice daily for 5 days for UTI. Advised to take vancomycin 125 mg p.o. every 6 hours for 8 days for C. difficile infection.  [place] Equipment/Devices:None  Discharge Condition: Stable CODE STATUS:Full code Diet recommendation: Heart Healthy   Brief [facility] Course: This 67 yrs old Male with PMH significant for diabetes mellitus, hypertension, morbid obesity, prostate cancer, thrombocytopenia presented in the ED with generalized weakness for about a week.  Patient has history of metastatic prostate cancer and on arrival he was found to be febrile with a temp of 100.4.  Patient is following up with urology and the Foley catheter was removed 2 days ago with the plans for measuring PVR.  Patient meets sepsis criteria on arrival and  started on empiric antibiotics( vancomycin and cefepime ). Initial lactic acid 2.0, UA nitrites+, LE+, stool for C. Difficile+, Patient was admitted for further evaluation.Patient was started on empiric antibiotics and IV fluid resuscitation. He was also started on p.o. vancomycin for C. difficile positive.  Renal function has improved.  Sepsis physiology resolved.  Blood cultures no growth to date, urine culture shows multiple species contaminated.  Antibiotic de-escalated to Keflex and p.o. vancomycin.  Hemoglobin dropped to 7.9, stool for occult blood negative. Likely secondary to chronic normochromic normocytic anemia.  S/p 2 unit PRBCs.  Hemoglobin remained stable afterwards. PT and OT recommended home health services.   Patient feels better and wants to be discharged.    Patient is being discharged home.  Discharge Diagnoses:  Principal Problem:   Generalized weakness Active Problems:   Alcohol use   Folate deficiency   Severe sepsis secondary to UTI ([place])   AKI (acute kidney injury) ([place])   Indwelling Foley catheter present   Prostate cancer metastatic to bone (HCC)   Anemia, chronic disease   Essential hypertension   Diabetes mellitus without complication (HCC)   Diarrhea   Malnutrition of moderate degree  Generalized weakness: Patient presented with generalized weakness could be multifactorial. It could be due to sepsis secondary to urinary tract infection and dehydration. Continue IV fluid resuscitation and empiric antibiotics. PT and OT evaluation > SNF Continue fall precautions.   Severe sepsis secondary to UTI: Patient met sepsis criteria (tachycardia, fever, tachypnea, lactic acid 2.0, UA positive for UTI).   Continue IV fluid resuscitation and empiric IV antibiotics. Blood culture NGTD, urine cultures multiple species> contaminated. De-escalate antibiotics to Keflex and p.o. vancomycin.   Acute kidney injury: > Resolved. Baseline serum creatinine 1.0, creatinine on arrival 1.38. Likely in the setting of sepsis, dehydration, post-obstructive symptoms. Insert Foley catheter for urinary retention. Continue IV fluid resuscitation.  Avoid nephrotoxic medications. AKI resolved, serum creatinine improved.   Metastatic prostate cancer: Patient follows up with urology. Continue patient on home regimen of Erleada. Outpatient oncology follow up.   Acute on chronic normocytic normochromic anemia: Baseline hemoglobin remained between 9 and 10. Presented with hemoglobin 6.0.  There is no any obvious visible bleeding S/p 2 unit PRBC.  Hemoglobin 9.3> 7.9 Stool for occult blood negative   Essential hypertension: Continue Imdur and lisinopril.   Diabetes mellitus: Carb modified  diet. Hold metformin due to AKI. Last hemoglobin A1c 6.5. Continue regular insulin sliding scale.   Alcohol use: Patient does have a history of alcohol abuse.   Continue CIWA protocol.   C. Difficile+: Patient reports having diarrhea.  Stool for C. difficile positive Continue p.o. vancomycin 125 mg po q6hr  x 8 days   Hypomagnesemia/hypokalemia: Replaced. Resolved.   Leukocytosis: Likely elevated due to C. difficile and UTI. Continue antibiotics and monitor WBC.  Discharge Instructions  Discharge Instructions     Call MD for:  difficulty breathing, headache or visual disturbances   Complete by: As directed    Call MD for:  persistant dizziness or light-headedness   Complete by: As directed    Call MD for:  persistant nausea and vomiting   Complete by: As directed    Diet - low sodium heart healthy   Complete by: As directed    Diet Carb Modified   Complete by: As directed    Discharge instructions   Complete by: As directed    Advised to follow-up with primary care physician in 1 week. Advised to follow-up with oncology as scheduled. Advised to take Keflex 500 mg twice daily for 5 days for UTI. Advised to take vancomycin 125 mg p.o. every 6 hours for 8 days for C. difficile infection.   Increase activity slowly   Complete by: As directed       Allergies as of 02/11/2023       Reactions   Albuterol Nausea And Vomiting   Mucinex [guaifenesin Er] Rash        Medication List     STOP taking these medications    metFORMIN 500 MG tablet Commonly known as: GLUCOPHAGE   predniSONE 10 MG tablet Commonly known as: DELTASONE   predniSONE 5 MG tablet Commonly known as: DELTASONE       TAKE these medications    apalutamide 240 MG tablet Commonly known as: ERLEADA Take 1 tablet (240 mg total) by mouth daily.   cephALEXin 500 MG capsule Commonly known as: KEFLEX Take 1 capsule (500 mg total) by mouth every 8 (eight) hours for 5 days.   isosorbide  mononitrate 30 MG 24 hr tablet Commonly known as: IMDUR TAKE 1 TABLET(30 MG) BY MOUTH DAILY   lisinopril 5 MG tablet Commonly known as: ZESTRIL Take 1 tablet (5 mg total) by mouth daily. Start taking on: February 12, 2023   oxyCODONE HCl 7.5 MG Tabs Take 7.5 mg by mouth every 4 (four) hours as needed for up to 3 days for severe pain. What changed:  medication strength how much to take when to take this   rosuvastatin 10 MG tablet Commonly known as: Crestor Take 1 tablet (10 mg total) by mouth daily.   vancomycin 125 MG capsule Commonly known as: VANCOCIN Take 1 capsule (125 mg total) by mouth 4 (four) times daily for 8 days.        Follow-up Information     Lahoma Rocker, MD Follow up.   Specialty: Rheumatology Contact information: [address] [place] [address] [place] [address] [phone]                Allergies  Allergen Reactions   Albuterol Nausea And Vomiting   Mucinex [Guaifenesin Er] Rash    Consultations: None   Procedures/Studies: DG Chest Port 1 View  Result Date: 02/07/2023 CLINICAL DATA:  Questionable sepsis EXAM: PORTABLE CHEST 1 VIEW COMPARISON:  Chest x-ray dated October 17, 2022 FINDINGS: The heart size  and mediastinal contours are within normal limits. Both lungs are clear. Numerous sclerotic osseous lesions, consistent with patient history of metastatic prostate cancer. IMPRESSION: 1. Lungs are clear. 2. Numerous sclerotic osseous lesions, consistent with patient history of metastatic prostate cancer. Electronically Signed   By: Yetta Glassman M.D.   On: 02/07/2023 19:32    Subjective: Patient was seen and examined at bedside.  Overnight events noted.  Patient reports doing very much improved.   He wants to be discharged.  He denies any flank pain, nausea, vomiting.  Discharge Exam: Vitals:   02/11/23 0423 02/11/23 0800  BP: 114/72 105/81  Pulse: 80 75  Resp: 18 18  Temp: 97.9 F (36.6 C) 97.6 F (36.4 C)  SpO2: 100%  100%   Vitals:   02/10/23 1814 02/10/23 1924 02/11/23 0423 02/11/23 0800  BP: 102/76 112/71 114/72 105/81  Pulse: 79 82 80 75  Resp: 18 18 18 18   Temp: 98.3 F (36.8 C) 97.9 F (36.6 C) 97.9 F (36.6 C) 97.6 F (36.4 C)  TempSrc: Oral  Oral Oral  SpO2: 100% 100% 100% 100%  Weight:      Height:        General: Pt is alert, awake, not in acute distress Cardiovascular: RRR, S1/S2 +, no rubs, no gallops Respiratory: CTA bilaterally, no wheezing, no rhonchi Abdominal: Soft, NT, ND, bowel sounds + Extremities: no edema, no cyanosis    The results of significant diagnostics from this hospitalization (including imaging, microbiology, ancillary and laboratory) are listed below for reference.     Microbiology: Recent Results (from the past 240 hour(s))  Resp panel by RT-PCR (RSV, Flu A&B, Covid) Anterior Nasal Swab     Status: None   Collection Time: 02/07/23  7:17 PM   Specimen: Anterior Nasal Swab  Result Value Ref Range Status   SARS Coronavirus 2 by RT PCR NEGATIVE NEGATIVE Final    Comment: (NOTE) SARS-CoV-2 target nucleic acids are NOT DETECTED.  The SARS-CoV-2 RNA is generally detectable in upper respiratory specimens during the acute phase of infection. The lowest concentration of SARS-CoV-2 viral copies this assay can detect is 138 copies/mL. A negative result does not preclude SARS-Cov-2 infection and should not be used as the sole basis for treatment or other patient management decisions. A negative result may occur with  improper specimen collection/handling, submission of specimen other than nasopharyngeal swab, presence of viral mutation(s) within the areas targeted by this assay, and inadequate number of viral copies(<138 copies/mL). A negative result must be combined with clinical observations, patient history, and epidemiological information. The expected result is Negative.  Fact Sheet for Patients:  EntrepreneurPulse.com.au  Fact Sheet  for Healthcare Providers:  IncredibleEmployment.be  This test is no t yet approved or cleared by the [place] FDA and  has been authorized for detection and/or diagnosis of SARS-CoV-2 by FDA under an Emergency Use Authorization (EUA). This EUA will remain  in effect (meaning this test can be used) for the duration of the COVID-19 declaration under Section 564(b)(1) of the Act, 21 U.S.C.section 360bbb-3(b)(1), unless the authorization is terminated  or revoked sooner.       Influenza A by PCR NEGATIVE NEGATIVE Final   Influenza B by PCR NEGATIVE NEGATIVE Final    Comment: (NOTE) The Xpert Xpress SARS-CoV-2/FLU/RSV plus assay is intended as an aid in the diagnosis of influenza from Nasopharyngeal swab specimens and should not be used as a sole basis for treatment. Nasal washings and aspirates are unacceptable for Xpert Xpress  SARS-CoV-2/FLU/RSV testing.  Fact Sheet for Patients: EntrepreneurPulse.com.au  Fact Sheet for Healthcare Providers: IncredibleEmployment.be  This test is not yet approved or cleared by the [place] FDA and has been authorized for detection and/or diagnosis of SARS-CoV-2 by FDA under an Emergency Use Authorization (EUA). This EUA will remain in effect (meaning this test can be used) for the duration of the COVID-19 declaration under Section 564(b)(1) of the Act, 21 U.S.C. section 360bbb-3(b)(1), unless the authorization is terminated or revoked.     Resp Syncytial Virus by PCR NEGATIVE NEGATIVE Final    Comment: (NOTE) Fact Sheet for Patients: EntrepreneurPulse.com.au  Fact Sheet for Healthcare Providers: IncredibleEmployment.be  This test is not yet approved or cleared by the [place] FDA and has been authorized for detection and/or diagnosis of SARS-CoV-2 by FDA under an Emergency Use Authorization (EUA). This EUA will remain in effect (meaning  this test can be used) for the duration of the COVID-19 declaration under Section 564(b)(1) of the Act, 21 U.S.C. section 360bbb-3(b)(1), unless the authorization is terminated or revoked.  Performed at [facility], [address], [place], [place] [address]   Blood Culture (routine x 2)     Status: None (Preliminary result)   Collection Time: 02/07/23  7:19 PM   Specimen: BLOOD  Result Value Ref Range Status   Specimen Description   Final    BLOOD BLOOD LEFT ARM Performed at [facility], [address], [place], [place] [address]    Special Requests   Final    BOTTLES DRAWN AEROBIC AND ANAEROBIC Blood Culture results may not be optimal due to an inadequate volume of blood received in culture bottles Performed at [facility], [address], [place], [place] [address]    Culture   Final    NO GROWTH 4 DAYS Performed at [facility], [place] [address], [place], [place] [address]    Report Status PENDING  Incomplete  Blood Culture (routine x 2)     Status: None (Preliminary result)   Collection Time: 02/07/23  8:16 PM   Specimen: BLOOD  Result Value Ref Range Status   Specimen Description   Final    BLOOD BLOOD LEFT ARM Performed at [facility], [address] [place], [place] [address]    Special Requests   Final    BOTTLES DRAWN AEROBIC AND ANAEROBIC Blood Culture adequate volume Performed at [facility], [address] [place], [place] [address]    Culture   Final    NO GROWTH 4 DAYS Performed at [facility], [place] [address], [place], [place] [address]    Report Status PENDING  Incomplete  Urine Culture     Status: Abnormal   Collection Time: 02/07/23  9:11 PM   Specimen: Urine, Clean Catch  Result Value Ref Range Status   Specimen Description   Final    URINE, CLEAN CATCH Performed at [facility], [place] [address] [place], [place] [address]    Special Requests   Final    NONE Reflexed from  [id number] Performed at [facility], [address] [place],  [address]    Culture (A)  Final    20,000 COLONIES/mL MULTIPLE SPECIES PRESENT, SUGGEST RECOLLECTION   Report Status 02/09/2023 FINAL  Final  Gastrointestinal Panel by PCR , Stool     Status: None   Collection Time: 02/08/23  1:17 AM   Specimen: STOOL  Result Value Ref Range Status  Campylobacter species NOT DETECTED NOT DETECTED Final   Plesimonas shigelloides NOT DETECTED NOT DETECTED Final   Salmonella species NOT DETECTED NOT DETECTED Final   Yersinia enterocolitica NOT DETECTED NOT DETECTED Final   Vibrio species NOT DETECTED NOT DETECTED Final   Vibrio cholerae NOT DETECTED NOT DETECTED Final   Enteroaggregative E coli (EAEC) NOT DETECTED NOT DETECTED Final   Enteropathogenic E coli (EPEC) NOT DETECTED NOT DETECTED Final   Enterotoxigenic E coli (ETEC) NOT DETECTED NOT DETECTED Final   Shiga like toxin producing E coli (STEC) NOT DETECTED NOT DETECTED Final   Shigella/Enteroinvasive E coli (EIEC) NOT DETECTED NOT DETECTED Final   Cryptosporidium NOT DETECTED NOT DETECTED Final   Cyclospora cayetanensis NOT DETECTED NOT DETECTED Final   Entamoeba histolytica NOT DETECTED NOT DETECTED Final   Giardia lamblia NOT DETECTED NOT DETECTED Final   Adenovirus F40/41 NOT DETECTED NOT DETECTED Final   Astrovirus NOT DETECTED NOT DETECTED Final   Norovirus GI/GII NOT DETECTED NOT DETECTED Final   Rotavirus A NOT DETECTED NOT DETECTED Final   Sapovirus (I, II, IV, and V) NOT DETECTED NOT DETECTED Final    Comment: Performed at [facility], [address] [place], [place] [address]  C Difficile Quick Screen w PCR reflex     Status: Abnormal   Collection Time: 02/08/23  1:17 AM   Specimen: STOOL  Result Value Ref Range Status   C Diff antigen POSITIVE (A) NEGATIVE Final   C Diff toxin NEGATIVE NEGATIVE Final   C Diff interpretation Results are indeterminate. See PCR results.  Final    Comment:  Performed at [facility], [address] [place], [place] [address]  C. Diff by PCR, Reflexed     Status: Abnormal   Collection Time: 02/08/23  1:17 AM  Result Value Ref Range Status   Toxigenic C. Difficile by PCR POSITIVE (A) NEGATIVE Final    Comment: Positive for toxigenic C. difficile with little to no toxin production. Only treat if clinical presentation suggests symptomatic illness. Performed at [facility], [place] [place],  [address]      Labs: BNP (last 3 results) No results for input(s): "BNP" in the last 8760 hours. Basic Metabolic Panel: Recent Labs  Lab 02/07/23 1919 02/08/23 0016 02/08/23 0416 02/09/23 0610 02/10/23 0510 02/11/23 0418  NA 133*  --  134* 131* 132* 136  K 2.5*  --  2.7* 3.7 3.1* 3.5  CL 100  --  106 109 110 111  CO2 20*  --  20* 17* 18* 19*  GLUCOSE 92  --  86 84 85 93  BUN 19  --  17 14 14 12   CREATININE 1.38*  --  1.02 0.94 0.85 0.86  CALCIUM 6.9*  --  6.5* 7.8* 7.3* 7.7*  MG  --  1.2* 1.3* 1.9 1.9 1.8  PHOS  --   --  2.3* 2.3* 3.2 2.7   Liver Function Tests: Recent Labs  Lab 02/07/23 1919 02/08/23 0416 02/10/23 0510  AST 18 11* 104*  ALT 6 5 19   ALKPHOS 165* 128* 289*  BILITOT 1.9* 1.4* 2.8*  PROT 5.7* 4.7* 4.9*  ALBUMIN 2.1* 1.6* 1.6*   No results for input(s): "LIPASE", "AMYLASE" in the last 168 hours. No results for input(s): "AMMONIA" in the last 168 hours. CBC: Recent Labs  Lab 02/07/23 1919 02/08/23 0016 02/08/23 0416 02/09/23 0610 02/10/23 0510 02/11/23 0418  WBC 20.4*  --  21.5* 22.5* 16.9*  --   NEUTROABS  17.0*  --  17.6*  --   --   --   HGB 7.2* 6.0* 5.7* 9.3* 7.9* 8.2*  HCT 24.7* 19.1* 17.0* 27.7* 23.4* 24.5*  MCV 107.9*  --  97.1 93.3 91.4  --   PLT 241  --  194 230 191  --    Cardiac Enzymes: No results for input(s): "CKTOTAL", "CKMB", "CKMBINDEX", "TROPONINI" in the last 168 hours. BNP: Invalid input(s): "POCBNP" CBG: Recent Labs  Lab 02/09/23 2022 02/10/23 0848  02/10/23 1203 02/10/23 1816 02/11/23 0857  GLUCAP 97 93 93 88 79   D-Dimer No results for input(s): "DDIMER" in the last 72 hours. Hgb A1c No results for input(s): "HGBA1C" in the last 72 hours. Lipid Profile No results for input(s): "CHOL", "HDL", "LDLCALC", "TRIG", "CHOLHDL", "LDLDIRECT" in the last 72 hours. Thyroid function studies No results for input(s): "TSH", "T4TOTAL", "T3FREE", "THYROIDAB" in the last 72 hours.  Invalid input(s): "FREET3" Anemia work up No results for input(s): "VITAMINB12", "FOLATE", "FERRITIN", "TIBC", "IRON", "RETICCTPCT" in the last 72 hours. Urinalysis    Component Value Date/Time   COLORURINE YELLOW (A) 02/07/2023 2111   APPEARANCEUR HAZY (A) 02/07/2023 2111   LABSPEC 1.013 02/07/2023 2111   PHURINE 5.0 02/07/2023 2111   GLUCOSEU NEGATIVE 02/07/2023 2111   HGBUR NEGATIVE 02/07/2023 2111   Doyline NEGATIVE 02/07/2023 2111   KETONESUR NEGATIVE 02/07/2023 2111   PROTEINUR NEGATIVE 02/07/2023 2111   NITRITE POSITIVE (A) 02/07/2023 2111   LEUKOCYTESUR LARGE (A) 02/07/2023 2111   Sepsis Labs Recent Labs  Lab 02/07/23 1919 02/08/23 0416 02/09/23 0610 02/10/23 0510  WBC 20.4* 21.5* 22.5* 16.9*   Microbiology Recent Results (from the past 240 hour(s))  Resp panel by RT-PCR (RSV, Flu A&B, Covid) Anterior Nasal Swab     Status: None   Collection Time: 02/07/23  7:17 PM   Specimen: Anterior Nasal Swab  Result Value Ref Range Status   SARS Coronavirus 2 by RT PCR NEGATIVE NEGATIVE Final    Comment: (NOTE) SARS-CoV-2 target nucleic acids are NOT DETECTED.  The SARS-CoV-2 RNA is generally detectable in upper respiratory specimens during the acute phase of infection. The lowest concentration of SARS-CoV-2 viral copies this assay can detect is 138 copies/mL. A negative result does not preclude SARS-Cov-2 infection and should not be used as the sole basis for treatment or other patient management decisions. A negative result may occur with   improper specimen collection/handling, submission of specimen other than nasopharyngeal swab, presence of viral mutation(s) within the areas targeted by this assay, and inadequate number of viral copies(<138 copies/mL). A negative result must be combined with clinical observations, patient history, and epidemiological information. The expected result is Negative.  Fact Sheet for Patients:  EntrepreneurPulse.com.au  Fact Sheet for Healthcare Providers:  IncredibleEmployment.be  This test is no t yet approved or cleared by the [place] FDA and  has been authorized for detection and/or diagnosis of SARS-CoV-2 by FDA under an Emergency Use Authorization (EUA). This EUA will remain  in effect (meaning this test can be used) for the duration of the COVID-19 declaration under Section 564(b)(1) of the Act, 21 U.S.C.section 360bbb-3(b)(1), unless the authorization is terminated  or revoked sooner.       Influenza A by PCR NEGATIVE NEGATIVE Final   Influenza B by PCR NEGATIVE NEGATIVE Final    Comment: (NOTE) The Xpert Xpress SARS-CoV-2/FLU/RSV plus assay is intended as an aid in the diagnosis of influenza from Nasopharyngeal swab specimens and should not be used as a sole  basis for treatment. Nasal washings and aspirates are unacceptable for Xpert Xpress SARS-CoV-2/FLU/RSV testing.  Fact Sheet for Patients: EntrepreneurPulse.com.au  Fact Sheet for Healthcare Providers: IncredibleEmployment.be  This test is not yet approved or cleared by the [place] FDA and has been authorized for detection and/or diagnosis of SARS-CoV-2 by FDA under an Emergency Use Authorization (EUA). This EUA will remain in effect (meaning this test can be used) for the duration of the COVID-19 declaration under Section 564(b)(1) of the Act, 21 U.S.C. section 360bbb-3(b)(1), unless the authorization is terminated or revoked.      Resp Syncytial Virus by PCR NEGATIVE NEGATIVE Final    Comment: (NOTE) Fact Sheet for Patients: EntrepreneurPulse.com.au  Fact Sheet for Healthcare Providers: IncredibleEmployment.be  This test is not yet approved or cleared by the [place] FDA and has been authorized for detection and/or diagnosis of SARS-CoV-2 by FDA under an Emergency Use Authorization (EUA). This EUA will remain in effect (meaning this test can be used) for the duration of the COVID-19 declaration under Section 564(b)(1) of the Act, 21 U.S.C. section 360bbb-3(b)(1), unless the authorization is terminated or revoked.  Performed at [facility], [address], [place], [place] [address]   Blood Culture (routine x 2)     Status: None (Preliminary result)   Collection Time: 02/07/23  7:19 PM   Specimen: BLOOD  Result Value Ref Range Status   Specimen Description   Final    BLOOD BLOOD LEFT ARM Performed at [facility], [address] [place], [place] [address]    Special Requests   Final    BOTTLES DRAWN AEROBIC AND ANAEROBIC Blood Culture results may not be optimal due to an inadequate volume of blood received in culture bottles Performed at [facility], [address] [place], [place] [address]    Culture   Final    NO GROWTH 4 DAYS Performed at [facility], [place] [address] [place], [place] [address]    Report Status PENDING  Incomplete  Blood Culture (routine x 2)     Status: None (Preliminary result)   Collection Time: 02/07/23  8:16 PM   Specimen: BLOOD  Result Value Ref Range Status   Specimen Description   Final    BLOOD BLOOD LEFT ARM Performed at [facility], [address], [place], [place] [address]    Special Requests   Final    BOTTLES DRAWN AEROBIC AND ANAEROBIC Blood Culture adequate volume Performed at [facility], [address], [place], [place] [address]    Culture   Final     NO GROWTH 4 DAYS Performed at [facility], [place] [address] [place], [place] [address]    Report Status PENDING  Incomplete  Urine Culture     Status: Abnormal   Collection Time: 02/07/23  9:11 PM   Specimen: Urine, Clean Catch  Result Value Ref Range Status   Specimen Description   Final    URINE, CLEAN CATCH Performed at [facility], [place] [address], [place], [place] [address]    Special Requests   Final    NONE Reflexed from [id number] Performed at [facility], [address] [place], [place] [address]    Culture (A)  Final    20,000 COLONIES/mL MULTIPLE SPECIES PRESENT, SUGGEST RECOLLECTION   Report Status 02/09/2023 FINAL  Final  Gastrointestinal Panel by PCR , Stool     Status: None   Collection Time: 02/08/23  1:17 AM  Specimen: STOOL  Result Value Ref Range Status   Campylobacter species NOT DETECTED NOT DETECTED Final   Plesimonas shigelloides NOT DETECTED NOT DETECTED Final   Salmonella species NOT DETECTED NOT DETECTED Final   Yersinia enterocolitica NOT DETECTED NOT DETECTED Final   Vibrio species NOT DETECTED NOT DETECTED Final   Vibrio cholerae NOT DETECTED NOT DETECTED Final   Enteroaggregative E coli (EAEC) NOT DETECTED NOT DETECTED Final   Enteropathogenic E coli (EPEC) NOT DETECTED NOT DETECTED Final   Enterotoxigenic E coli (ETEC) NOT DETECTED NOT DETECTED Final   Shiga like toxin producing E coli (STEC) NOT DETECTED NOT DETECTED Final   Shigella/Enteroinvasive E coli (EIEC) NOT DETECTED NOT DETECTED Final   Cryptosporidium NOT DETECTED NOT DETECTED Final   Cyclospora cayetanensis NOT DETECTED NOT DETECTED Final   Entamoeba histolytica NOT DETECTED NOT DETECTED Final   Giardia lamblia NOT DETECTED NOT DETECTED Final   Adenovirus F40/41 NOT DETECTED NOT DETECTED Final   Astrovirus NOT DETECTED NOT DETECTED Final   Norovirus GI/GII NOT DETECTED NOT DETECTED Final   Rotavirus A NOT DETECTED NOT DETECTED Final   Sapovirus (I, II, IV, and  V) NOT DETECTED NOT DETECTED Final    Comment: Performed at [facility], [address] [place], [place] [address]  C Difficile Quick Screen w PCR reflex     Status: Abnormal   Collection Time: 02/08/23  1:17 AM   Specimen: STOOL  Result Value Ref Range Status   C Diff antigen POSITIVE (A) NEGATIVE Final   C Diff toxin NEGATIVE NEGATIVE Final   C Diff interpretation Results are indeterminate. See PCR results.  Final    Comment: Performed at [facility], [address] [place], [place] [address]  C. Diff by PCR, Reflexed     Status: Abnormal   Collection Time: 02/08/23  1:17 AM  Result Value Ref Range Status   Toxigenic C. Difficile by PCR POSITIVE (A) NEGATIVE Final    Comment: Positive for toxigenic C. difficile with little to no toxin production. Only treat if clinical presentation suggests symptomatic illness. Performed at [facility], [address], [place], [place] [address]      Time coordinating discharge: Over 30 minutes  SIGNED:   Duard Brady, MD  [facility] 02/11/2023, 11:48 AM Pager   If 7PM-7AM, please contact night-coverage

## 2023-02-12 ENCOUNTER — Other Ambulatory Visit: Payer: Self-pay | Admitting: Family Medicine

## 2023-02-12 LAB — CULTURE, BLOOD (ROUTINE X 2)
Culture: NO GROWTH
Culture: NO GROWTH
Special Requests: ADEQUATE

## 2023-02-12 MED ORDER — OXYCODONE HCL 5 MG PO TABS: 5.0000 mg | ORAL_TABLET | Freq: Three times a day (TID) | ORAL | 0 refills | Status: AC | PRN

## 2023-02-12 NOTE — Progress Notes (Unsigned)
{  Select_TRH_Note:26780} 

## 2023-02-13 ENCOUNTER — Other Ambulatory Visit: Payer: 59

## 2023-02-18 ENCOUNTER — Telehealth: Payer: Self-pay

## 2023-02-18 DIAGNOSIS — C61 Malignant neoplasm of prostate: Secondary | ICD-10-CM

## 2023-02-18 NOTE — Telephone Encounter (Signed)
Spoke with patient as I did not have a number for [employer]. Patient sounded alert. He is not having any acute symptoms of urinary pain, abdominal pain or bloating, no fever. He is just having lingering diarrhea issue and not really eating or drinking due to diarrhea and weakness. This has been an issue since been in the hospital on 02/07/23. Patient states he does not feel like he is having acute issue but possible a general decline in his health. He did not have his sisters phone number and gave me his girlfriends number Ella Jubilee [phone], to call and ask for his sister's phone number. I called but there was no answer. He is aware and recalls speaking with [name], RN and the suggestion of seen if he qualifies for hospice care

## 2023-02-18 NOTE — Telephone Encounter (Signed)
Yes, we can absolutely refer to hospice, however I have some questions based on the original message. He's due to follow up with me in clinic tomorrow. If he still urinating? Has he had any fevers? Is he having abdominal pain or distention?

## 2023-02-18 NOTE — Telephone Encounter (Signed)
815 am.  Message received from [facility] of patient.  He fell off the sofa over the weekend.  Unable to care for himself at home and needs more assistance.  Phone call made to patient to follow up on overall condition.  Patient reports no appetite-continues to loose weight.  Last weight from hospital 175 lbs-40 lb weight loss 4 months.  Diarrhea has improved.  Very weak with limited mobility.  Discussed hospice support with patient and he is agreeable to proceed.  Patient does not have primary PCP at this time.  Phone call made to urology to see if they would give an order.  Message left for Charlene.  Response pending.

## 2023-02-18 NOTE — Telephone Encounter (Signed)
Hospice referral placed.

## 2023-02-18 NOTE — Telephone Encounter (Signed)
[name] [facility] called asking if Dr Erlene Quan can give order/referral for Hospice. See phone note in the chart, putting information as the reason below: Roger, Lue[place], RN   JB   02/18/23  8:27 AM Note 815 am.  Message received from [facility] of patient.  He fell off the sofa over the weekend.  Unable to care for himself at home and needs more assistance.  Phone call made to patient to follow up on overall condition.  Patient reports no appetite-continues to loose weight.  Last weight from hospital 175 lbs-40 lb weight loss 4 months.  Diarrhea has improved.  Very weak with limited mobility.  Discussed hospice support with patient and he is agreeable to proceed.  Patient does not have primary PCP at this time.  Phone call made to urology to see if they would give an order.  Message left for Charlene.  Response pending.

## 2023-02-18 NOTE — Telephone Encounter (Addendum)
error 

## 2023-02-19 ENCOUNTER — Ambulatory Visit: Payer: 59 | Admitting: Physician Assistant

## 2023-02-19 ENCOUNTER — Telehealth: Payer: Self-pay

## 2023-02-19 NOTE — Telephone Encounter (Signed)
Copied from [place] [id number]. Topic: Transportation - Transportation >> Feb 19, 2023 12:49 PM Oley Balm E wrote: Reason for CRM: Pt's friend Dewaine Oats called requesting transportation for the patient's appt on Thursday Best contact: [phone]  Wheelchair access needed

## 2023-02-20 ENCOUNTER — Telehealth: Payer: Self-pay | Admitting: Physician Assistant

## 2023-02-20 NOTE — Progress Notes (Deleted)
New patient visit   Patient: Roger [name]   DOB: August 08, 1956   67 y.o. Male  MRN: [id number] Visit Date: 02/21/2023  Today's healthcare provider: Mikey Kirschner, PA-C   No chief complaint on file.  Subjective    Roger [name] is a 67 y.o. male who presents today as a new patient to establish care.  HPI  ***  Past Medical History:  Diagnosis Date   Chest pain    a. 04/2016 MV: small, mild apical defect c/w apical ischemia, EF 55-65%-->Low risk study.   Diabetes mellitus without complication ([place])    Gout    Hematochezia    History of echocardiogram    a. 04/2016 Echo: EF 55-60%, mild LVH, no rwma, midly dil LA.   History of heart attack    Hypertension    Morbid obesity ([place])    Prostate cancer ([place])    Thrombocytopenia ([place]) 09/28/2022   Past Surgical History:  Procedure Laterality Date   COLONOSCOPY WITH PROPOFOL N/A 08/05/2018   Procedure: COLONOSCOPY WITH PROPOFOL;  Surgeon: Lin Landsman, MD;  Location[facility] ENDOSCOPY;  Service: Gastroenterology;  Laterality: N/A;   thumb surgery     Family Status  Relation Name Status   Mother  Alive   Father  Alive   Other  (Not Specified)   Family History  Problem Relation Age of Onset   Heart Problems Mother    Breast cancer Mother    Mental illness Father    Heart disease Father    Stroke Father    Diabetes Father    Alcohol abuse Father    Bipolar disorder Father    Heart disease Other    Hypertension Other    Diabetes Other    Social History   Socioeconomic History   Marital status: Divorced    Spouse name: Not on file   Number of children: 5   Years of education: Not on file   Highest education level: High school graduate  Occupational History   Not on file  Tobacco Use   Smoking status: Former    Packs/day: 0.25    Years: 25.00    Additional pack years: 0.00    Total pack years: 6.25    Types: Cigarettes    Quit date: 79    Years since quitting: 30.2    Passive exposure: Past    Smokeless tobacco: Never  Vaping Use   Vaping Use: Never used  Substance and Sexual Activity   Alcohol use: Yes    Alcohol/week: 42.0 standard drinks of alcohol    Types: 10 Cans of beer, 32 Shots of liquor per week    Comment: everyday. 3-5 servings whiskey nightly & beer   Drug use: No   Sexual activity: Not Currently  Other Topics Concern   Not on file  Social History Narrative   Lives at SNF.   Social Determinants of Health   Financial Resource Strain: High Risk (11/28/2022)   Overall Financial Resource Strain (CARDIA)    Difficulty of Paying Living Expenses: Very hard  Food Insecurity: Food Insecurity Present (02/08/2023)   Hunger Vital Sign    Worried About Running Out of Food in the Last Year: Sometimes true    Ran Out of Food in the Last Year: Sometimes true  Transportation Needs: Unmet Transportation Needs (02/08/2023)   PRAPARE - Transportation    Lack of Transportation (Medical): No    Lack of Transportation (Non-Medical): Yes  Physical Activity: Inactive (11/28/2022)  Exercise Vital Sign    Days of Exercise per Week: 0 days    Minutes of Exercise per Session: 0 min  Stress: Stress Concern Present (11/30/2022)   [place]    Feeling of Stress : Rather much  Social Connections: Socially Isolated (11/30/2022)   Social Connection and Isolation Panel [NHANES]    Frequency of Communication with Friends and Family: More than three times a week    Frequency of Social Gatherings with Friends and Family: Twice a week    Attends Religious Services: Never    Marine scientist or Organizations: No    Attends Archivist Meetings: Never    Marital Status: Divorced   Outpatient Medications Prior to Visit  Medication Sig   apalutamide (ERLEADA) 240 MG tablet Take 1 tablet (240 mg total) by mouth daily. (Patient not taking: Reported on 02/08/2023)   isosorbide mononitrate (IMDUR) 30 MG 24 hr tablet TAKE 1  TABLET(30 MG) BY MOUTH DAILY   lisinopril (ZESTRIL) 5 MG tablet Take 1 tablet (5 mg total) by mouth daily.   rosuvastatin (CRESTOR) 10 MG tablet Take 1 tablet (10 mg total) by mouth daily.   No facility-administered medications prior to visit.   Allergies  Allergen Reactions   Albuterol Nausea And Vomiting   Mucinex [Guaifenesin Er] Rash     There is no immunization history on file for this patient.  Health Maintenance  Topic Date Due   COVID-19 Vaccine (1) Never done   Diabetic kidney evaluation - Urine ACR  Never done   Hepatitis C Screening  Never done   DTaP/Tdap/Td (1 - Tdap) Never done   Zoster Vaccines- Shingrix (1 of 2) Never done   OPHTHALMOLOGY EXAM  11/28/2017   FOOT EXAM  04/19/2019   COLONOSCOPY (Pts 45-74yrs Insurance coverage will need to be confirmed)  08/05/2021   INFLUENZA VACCINE  Never done   HEMOGLOBIN A1C  03/28/2023   Diabetic kidney evaluation - eGFR measurement  02/11/2024   HIV Screening  Completed   HPV VACCINES  Aged Out    Patient Care Team: Lahoma Rocker, MD as PCP - General (Rheumatology)  Review of Systems  {Labs  Heme  Chem  Endocrine  Serology  Results Review (optional):23779}   Objective    There were no vitals taken for this visit. {Show previous vital signs (optional):23777}  Physical Exam ***  Depression Screen    11/28/2022    5:02 PM 07/17/2019   10:26 AM 04/18/2018    2:30 PM  PHQ 2/9 Scores  PHQ - 2 Score 0 0 0   No results found for any visits on 02/21/23.  Assessment & Plan     ***  No follow-ups on file.     {provider attestation***:1}   Mikey Kirschner, PA-C  [place] [phone] (phone) [phone] (fax)  [place]

## 2023-02-21 ENCOUNTER — Ambulatory Visit: Payer: 59 | Admitting: Physician Assistant

## 2023-02-21 ENCOUNTER — Telehealth: Payer: Self-pay | Admitting: Physician Assistant

## 2023-02-26 ENCOUNTER — Telehealth: Payer: Self-pay | Admitting: *Deleted

## 2023-02-26 ENCOUNTER — Telehealth: Payer: Self-pay

## 2023-02-26 NOTE — Telephone Encounter (Signed)
Copied from [place] [id number]. Topic: Transportation - Transportation >> Feb 26, 2023 10:14 AM Barbie Haggis wrote: Reason for CRM:  Patient is requesting transportation for an upcoming new patient visit 04/05/2023

## 2023-02-27 ENCOUNTER — Ambulatory Visit: Payer: Self-pay | Admitting: *Deleted

## 2023-02-27 NOTE — Patient Instructions (Signed)
Visit Information  Thank you for taking time to visit with me today. Please don't hesitate to contact me if I can be of assistance to you.   Following are the goals we discussed today:   Goals Addressed             This Visit's Progress    "I need transportation to my doctor's appointments"       Activities and task to complete in order to accomplish goals.   CSW to contact [facility] to complete referral for rides to medical appointments         Our next appointment is by telephone on 02/27/23 at 11am  Please call the care guide team at [phone] if you need to cancel or reschedule your appointment.   If you are experiencing a Mental Health or [place] or need someone to talk to, please call 911   Patient verbalizes understanding of instructions and care plan provided today and agrees to view in [place]. Active MyChart status and patient understanding of how to access instructions and care plan via MyChart confirmed with patient.     Telephone follow up appointment with care management team member scheduled for: 02/27/23  Elliot Gurney, [place] Worker  [facility] Care Management [phone] \

## 2023-02-27 NOTE — Patient Instructions (Signed)
Visit Information  Thank you for taking time to visit with me today. Please don't hesitate to contact me if I can be of assistance to you.   Following are the goals we discussed today:   Goals Addressed             This Visit's Progress    "I need transportation to my doctor's appointments"       Activities and task to complete in order to accomplish goals.   Patient to await return call from [facility] to complete referral for rides to medical appointments         Our next appointment is by telephone on 03/01/23 at 1pm  Please call the care guide team at [phone] if you need to cancel or reschedule your appointment.   If you are experiencing a Mental Health or [place] or need someone to talk to, please call 911   Patient verbalizes understanding of instructions and care plan provided today and agrees to view in [place]. Active MyChart status and patient understanding of how to access instructions and care plan via MyChart confirmed with patient.     Telephone follow up appointment with care management team member scheduled for: 03/01/23  Elliot Gurney, [place] Worker  [facility] Care Management [phone]

## 2023-02-27 NOTE — Patient Outreach (Signed)
  Care Coordination   Initial Visit Note   02/27/2023 Late Entry Name: Roger [name] MRN: [id number] DOB: 10-12-1956  Roger [name] is a 67 y.o. year old male who sees Lahoma Rocker, MD for primary care. I spoke with  Roger [name] by phone on 02/26/23.  What matters to the patients health and wellness today?  Transportation to medical appointments    Goals Addressed             This Visit's Progress    "I need transportation to my doctor's appointments"       Activities and task to complete in order to accomplish goals.   CSW to contact [facility] to complete referral for rides to medical appointments         SDOH assessments and interventions completed:  Yes  SDOH Interventions Today    Flowsheet Row Most Recent Value  SDOH Interventions   Food Insecurity Interventions --  [receives food stamps 43.00 per month]  Housing Interventions Intervention Not Indicated  [resides in mobile home that is paid for]  Utilities Interventions Intervention Not Indicated        Care Coordination Interventions:  Yes, provided  Interventions Today    Flowsheet Row Most Recent Value  Chronic Disease   Chronic disease during today's visit Other  [weakness]  General Interventions   General Interventions Discussed/Reviewed General Interventions Discussed, [employer] contacted, patient able to be utilize this service for transportation, will need to call in the morning to refer patient and have him assessed for eligibility]  Education Interventions   Education Provided Provided Education  Provided Verbal Education On [employer] referral processed discussed with patient]       Follow up plan: Follow up call scheduled for 02/28/23    Encounter Outcome:  Pt. Visit Completed

## 2023-02-27 NOTE — Patient Outreach (Signed)
  Care Coordination   Follow Up Visit Note   02/27/2023 Name: Roger [name] MRN: [id number] DOB: 1956/07/15  Roger [name] is a 67 y.o. year old male who sees Roger Rocker, MD for primary care. I spoke with  Roger [name] by phone today.  What matters to the patients health and wellness today?  Transportation to medical appointments    Goals Addressed             This Visit's Progress    "I need transportation to my doctor's appointments"       Activities and task to complete in order to accomplish goals.   Patient to await return call from [facility] to complete referral for rides to medical appointments         SDOH assessments and interventions completed:  No     Care Coordination Interventions:  Yes, provided  Interventions Today    Flowsheet Row Most Recent Value  Chronic Disease   Chronic disease during today's visit Other  [weakness]  General Interventions   General Interventions Discussed/Reviewed General Interventions Reviewed, [employer], Communication with  Communication with --  [Phone call to [facility] to refer patient for transportation-they requested that patient call to enroll-collaboration phone call to [facility]-VM left requesting a return call]  Education Interventions   Provided Verbal Education On [employer]  [discussed [employer] transporation enrollment process]       Follow up plan: Follow up call scheduled for 03/01/23    Encounter Outcome:  Pt. Visit Completed

## 2023-03-01 ENCOUNTER — Ambulatory Visit: Payer: Self-pay | Admitting: *Deleted

## 2023-03-01 NOTE — Patient Outreach (Signed)
  Care Coordination   Follow Up Visit Note   03/01/2023 Name: MITCHEL BLACHE MRN: [id number] DOB: [birth year]  KAYDE HOGGATT is a 67 y.o. year old male who sees Casimer Lanius, MD for primary care. I spoke with  Nena Alexander by phone today.  What matters to the patients health and wellness today?  Transportation to medical appointments. Patient has contacted [facility] to completed assessment, however they have not returned the call to date.    Goals Addressed             This Visit's Progress    "I need transportation to my doctor's appointments"       Activities and task to complete in order to accomplish goals.   Patient to await return call from [facility] to complete referral for rides to medical appointments CSW will continue to assist with coordinating transportation for patient to medical appointments         SDOH assessments and interventions completed:  No     Care Coordination Interventions:  Yes, provided  Interventions Today    Flowsheet Row Most Recent Value  Chronic Disease   Chronic disease during today's visit Other  [weakness]  General Interventions   General Interventions Discussed/Reviewed General Interventions Reviewed, Communication with  Communication with --  [[facility] contacted to follow up with transportation referral request-VM left for a return call]       Follow up plan: Follow up call scheduled for 03/05/23    Encounter Outcome:  Pt. Visit Completed

## 2023-03-01 NOTE — Patient Instructions (Signed)
Visit Information  Thank you for taking time to visit with me today. Please don't hesitate to contact me if I can be of assistance to you.   Following are the goals we discussed today:   Goals Addressed             This Visit's Progress    "I need transportation to my doctor's appointments"       Activities and task to complete in order to accomplish goals.   Patient to await return call from [facility] to complete referral for rides to medical appointments CSW will continue to assist with coordinating transportation for patient to medical appointments         Our next appointment is by telephone on 03/05/23 at 10am  Please call the care guide team at [phone] if you need to cancel or reschedule your appointment.   If you are experiencing a Mental Health or Behavioral Health Crisis or need someone to talk to, please call 911   Patient verbalizes understanding of instructions and care plan provided today and agrees to view in MyChart. Active MyChart status and patient understanding of how to access instructions and care plan via MyChart confirmed with patient.     Telephone follow up appointment with care management team member scheduled for: 03/05/23  Verna Czech, LCSW Clinical Social Worker  [facility] Care Management [phone]

## 2023-03-04 ENCOUNTER — Encounter: Payer: Self-pay | Admitting: *Deleted

## 2023-03-04 ENCOUNTER — Ambulatory Visit: Payer: Self-pay | Admitting: *Deleted

## 2023-03-04 NOTE — Patient Outreach (Signed)
  Care Coordination   Follow Up Visit Note   03/04/2023 Name: Roger [name] MRN: [id number] DOB: Oct 25, 1956  Roger [name] is a 67 y.o. year old male who sees Casimer Lanius, MD for primary care. I spoke with  [facility] on behalf of Roger [name] by phone today.  What matters to the patients health and wellness today?  Transportation to medical appointments    Goals Addressed             This Visit's Progress    "I need transportation to my doctor's appointments"       Activities and task to complete in order to accomplish goals.   Patient to await return call from [facility] to complete referral for rides to medical appointments -follow up phone made to [facility] to follow up on previous phone call-preliminary referral completed CSW will continue to assist with coordinating transportation for patient to medical appointments         SDOH assessments and interventions completed:  No     Care Coordination Interventions:  Yes, provided  Interventions Today    Flowsheet Row Most Recent Value  General Interventions   General Interventions Discussed/Reviewed Communication with  Communication with --  [[facility]-completed preliminary referral for patient to begin to receive transporation to medical appointments. [facility] will contact patient to confirm eligibility]       Follow up plan: Follow up call scheduled for 03/11/23    Encounter Outcome:  Pt. Visit Completed

## 2023-03-06 ENCOUNTER — Telehealth: Payer: Self-pay | Admitting: *Deleted

## 2023-03-06 NOTE — Patient Outreach (Signed)
  Care Coordination   Follow Up Visit Note   03/06/2023 Name: CORSON AYBAR MRN: [id number] DOB: Dec 13, 1955  SHAWNDELL MALLERNEE is a 67 y.o. year old male who sees Casimer Lanius, MD for primary care. I spoke with  Nena Alexander by phone today.  What matters to the patients health and wellness today?  Transportation to medical appointments    Goals Addressed             This Visit's Progress    "I need transportation to my doctor's appointments"       Activities and task to complete in order to accomplish goals.   Patient to await return call from [facility] to complete referral for rides to medical appointments -collaboration phone made to [facility] to follow up on previous phone call-preliminary referral completed CSW will continue to assist with coordinating transportation for patient to medical appointments         SDOH assessments and interventions completed:  No     Care Coordination Interventions:  Yes, provided  Interventions Today    Flowsheet Row Most Recent Value  Chronic Disease   Chronic disease during today's visit Other  [weakness]  General Interventions   General Interventions Discussed/Reviewed General Interventions Reviewed, [employer]  Education Interventions   Provided Verbal Education On [employer] confirms receiving no call from [employer] regarding transportation coordination-collaboration phone call to [facility] VM left requesting a return call]       Follow up plan: Follow up call scheduled for 03/11/23    Encounter Outcome:  Pt. Visit Completed

## 2023-03-06 NOTE — Patient Instructions (Signed)
Visit Information  Thank you for taking time to visit with me today. Please don't hesitate to contact me if I can be of assistance to you.   Following are the goals we discussed today:   Goals Addressed             This Visit's Progress    "I need transportation to my doctor's appointments"       Activities and task to complete in order to accomplish goals.   Patient to await return call from [facility] to complete referral for rides to medical appointments -collaboration phone made to [facility] to follow up on previous phone call-preliminary referral completed CSW will continue to assist with coordinating transportation for patient to medical appointments         Our next appointment is by telephone on 03/11/23 at 10am  Please call the care guide team at [phone] if you need to cancel or reschedule your appointment.   If you are experiencing a Mental Health or Behavioral Health Crisis or need someone to talk to, please call 911   Patient verbalizes understanding of instructions and care plan provided today and agrees to view in MyChart. Active MyChart status and patient understanding of how to access instructions and care plan via MyChart confirmed with patient.     Telephone follow up appointment with care management team member scheduled for: 03/11/23  Verna Czech, LCSW Clinical Social Worker  [facility] Management [phone]

## 2023-03-11 ENCOUNTER — Encounter: Payer: Self-pay | Admitting: *Deleted

## 2023-03-11 NOTE — Patient Outreach (Signed)
  Care Coordination   Follow Up Visit Note   03/11/2023 Name: COPEN SHEELEY MRN: [id number] DOB: May 09, 1956  DANTAVIOUS GOBLIRSCH is a 67 y.o. year old male who sees Casimer Lanius, MD for primary care. I  spoke with Medicaid transportation and [facility] regarding patient's transportation options  What matters to the patients health and wellness today?  Transportation to Medical appointments    Goals Addressed             This Visit's Progress    "I need transportation to my doctor's appointments"       Activities and task to complete in order to accomplish goals.   Patient to await call [facility] [phone] to enroll, patient not eligible for Medicaid transportation but is able to ride [facility]-free of charge at this time CSW will continue to assist with coordinating transportation for patient to medical appointments         SDOH assessments and interventions completed:  No     Care Coordination Interventions:  Yes, provided  Interventions Today    Flowsheet Row Most Recent Value  Chronic Disease   Chronic disease during today's visit Other  [weakness]  General Interventions   General Interventions Discussed/Reviewed Communication with  Communication with --  [Return call from [facility] transportation, patient not eligble as he only has family planning medicaid-patient eligible for [facility] -he would have to call and enroll [phone]-currently they are not charging for rides]       Follow up plan: Follow up call scheduled for 03/15/23     Encounter Outcome:  Pt. Visit Completed

## 2023-03-11 NOTE — Telephone Encounter (Signed)
This encounter was created in error - please disregard.

## 2023-03-11 NOTE — Patient Outreach (Signed)
  Care Coordination   Follow Up Visit Note   03/11/2023 Name: Roger [name] MRN: [id number] DOB: Mar 04, 1956  Roger [name] is a 67 y.o. year old male who sees Casimer Lanius, MD for primary care. I  [employer]  What matters to the patients health and wellness today?  Transportation to medical appointments    Goals Addressed             This Visit's Progress    "I need transportation to my doctor's appointments"       Activities and task to complete in order to accomplish goals.   Patient to await return call from [facility] to complete referral for rides to medical appointments -collaboration phone made to [facility] to follow up on previous phone call-preliminary referral completed however patient is currently not on approval list to date CSW will continue to assist with coordinating transportation for patient to medical appointments         SDOH assessments and interventions completed:  No     Care Coordination Interventions:  Yes, provided  Interventions Today    Flowsheet Row Most Recent Value  Chronic Disease   Chronic disease during today's visit Other  [WEAKNESS]  General Interventions   General Interventions Discussed/Reviewed [employer], Communication with  Communication with --  [[facility]-confirmed that patient has not been approved for medicaid rides-discussed the importance of this approval to coordinate rides to medical appointments as patient is home bound at this time. Transferred to coodinator-left VM]       Follow up plan: Follow up call scheduled for 03/18/23    Encounter Outcome:  Pt. Visit Completed

## 2023-03-12 ENCOUNTER — Telehealth: Payer: Self-pay | Admitting: *Deleted

## 2023-03-12 ENCOUNTER — Encounter: Payer: Self-pay | Admitting: *Deleted

## 2023-03-12 NOTE — Patient Instructions (Signed)
Visit Information  Thank you for taking time to visit with me today. Please don't hesitate to contact me if I can be of assistance to you.   Following are the goals we discussed today:   Goals Addressed             This Visit's Progress    "I need transportation to my doctor's appointments"       Activities and task to complete in order to accomplish goals.   Patient to await call [facility] [phone] to complete home assessment CSW will continue to assist with coordinating transportation for patient to medical appointments         Our next appointment is by telephone on 03/15/23 at 10am  Please call the care guide team at [phone] if you need to cancel or reschedule your appointment.   If you are experiencing a Mental Health or Behavioral Health Crisis or need someone to talk to, please call 911   Patient verbalizes understanding of instructions and care plan provided today and agrees to view in MyChart. Active MyChart status and patient understanding of how to access instructions and care plan via MyChart confirmed with patient.     Telephone follow up appointment with care management team member scheduled for: 03/15/23  Verna Czech, LCSW Clinical Social Worker  [facility] Care Management [phone]

## 2023-03-12 NOTE — Telephone Encounter (Signed)
This encounter was created in error - please disregard.

## 2023-03-12 NOTE — Patient Outreach (Signed)
  Care Coordination   Follow Up Visit Note   03/12/2023 Name: Roger [name] MRN: [id number] DOB: August 04, 1956  Roger [name] is a 67 y.o. year old male who sees Casimer Lanius, MD for primary care. I spoke with  Nena Alexander by phone today.  What matters to the patients health and wellness today?  Transportation to medical appointments    Goals Addressed             This Visit's Progress    "I need transportation to my doctor's appointments"       Activities and task to complete in order to accomplish goals.   Patient to await call [facility] [phone] to complete home assessment CSW will continue to assist with coordinating transportation for patient to medical appointments         SDOH assessments and interventions completed:  No     Care Coordination Interventions:  Yes, provided  Interventions Today    Flowsheet Row Most Recent Value  Chronic Disease   Chronic disease during today's visit Diabetes, Hypertension (HTN)  General Interventions   General Interventions Discussed/Reviewed General Interventions Reviewed, [employer], Communication with  Communication with --  [employer] phone call to [facility]-patient now enrolled, however home assessment will need to be done before rides can be scheduled to make sure that the Zenaida Niece can get to patient's home and they can get the W/C up and down the ramp safely]  Education Interventions   Education Provided Provided Education  Provided Verbal Education On [employer] eligibility process]       Follow up plan: Follow up call scheduled for 03/15/23    Encounter Outcome:  Pt. Visit Completed

## 2023-03-15 ENCOUNTER — Ambulatory Visit: Payer: Self-pay | Admitting: *Deleted

## 2023-03-15 NOTE — Patient Instructions (Signed)
Visit Information  Thank you for taking time to visit with me today. Please don't hesitate to contact me if I can be of assistance to you.   Following are the goals we discussed today:   Goals Addressed             This Visit's Progress    "I need transportation to my doctor's appointments"       Activities and task to complete in order to accomplish goals.   Patient approved for [facility] [phone] patient agreeable to call to schedule rides to medical appointments CSW will continue to assist with coordinating transportation for patient to medical appointments         Our next appointment is by telephone on 03/29/23 at 10am  Please call the care guide team at [phone] if you need to cancel or reschedule your appointment.   If you are experiencing a Mental Health or Behavioral Health Crisis or need someone to talk to, please call 911   Patient verbalizes understanding of instructions and care plan provided today and agrees to view in MyChart. Active MyChart status and patient understanding of how to access instructions and care plan via MyChart confirmed with patient.     Telephone follow up appointment with care management team member scheduled for: 03/29/23 Verna Czech, LCSW Clinical Social Worker  [facility] Care Management [phone]

## 2023-03-15 NOTE — Patient Outreach (Signed)
  Care Coordination   Follow Up Visit Note   03/15/2023 Name: Roger [name] MRN: [id number] DOB: 1956/05/22  Roger [name] is a 67 y.o. year old male who sees Casimer Lanius, MD for primary care. I spoke with  Nena Alexander by phone today.  What matters to the patients health and wellness today?  Transportation to medical appointments    Goals Addressed             This Visit's Progress    "I need transportation to my doctor's appointments"       Activities and task to complete in order to accomplish goals.   Patient approved for [facility] [phone] patient agreeable to call to schedule rides to medical appointments CSW will continue to assist with coordinating transportation for patient to medical appointments         SDOH assessments and interventions completed:  No     Care Coordination Interventions:  Yes, provided  Interventions Today    Flowsheet Row Most Recent Value  Chronic Disease   Chronic disease during today's visit Diabetes, Hypertension (HTN)  General Interventions   General Interventions Discussed/Reviewed General Interventions Reviewed, [employer], Communication with  Communication with --  [[facility]-confirmed that patient is now approved to schedule rides]  Education Interventions   Education Provided Provided Education  [employer] provided-patient instructed on how to schedule rides for medical visits]  Provided Verbal Education On --  [employer] provided-patient instructed on how to schedule rides for medical visits]       Follow up plan: Follow up call scheduled for 03/29/23    Encounter Outcome:  Pt. Visit Completed

## 2023-03-29 ENCOUNTER — Ambulatory Visit: Payer: Self-pay | Admitting: *Deleted

## 2023-03-29 ENCOUNTER — Ambulatory Visit: Payer: 59 | Admitting: Family Medicine

## 2023-03-29 NOTE — Patient Outreach (Signed)
  Care Coordination   Follow Up Visit Note   03/29/2023 Name: Roger [name] MRN: [id number] DOB: Jan 21, 1956  Roger [name] is a 67 y.o. year old male who sees Casimer Lanius, MD for primary care. I spoke with  Nena Alexander by phone today.  What matters to the patients health and wellness today?  Transportation to medical appointments. Patient now active with [facility] and understands to contact them with transportation needs to medical appointments.     Goals Addressed             This Visit's Progress    "I need transportation to my doctor's appointments"       Activities and task to complete in order to accomplish goals.   Patient approved for [facility] [phone] patient agreeable to call to schedule rides to medical appointments Transportation arranged for PCP appointment on 04/05/23         SDOH assessments and interventions completed:  No     Care Coordination Interventions:  Yes, provided  Interventions Today    Flowsheet Row Most Recent Value  Chronic Disease   Chronic disease during today's visit Diabetes, Hypertension (HTN)  General Interventions   General Interventions Discussed/Reviewed General Interventions Reviewed, [employer], Communication with  Communication with --  [employer] phone call to [facility] to confirm that patient has been approved for transportation and that his ride to the PCP office has been arranged-patient approved for rides, and has been arranged for 04/05/23 at 3:20pm]  Education Interventions   Education Provided Provided Education  [patient educated on process to arrange transportation through [facility]]       Follow up plan: No further intervention required.   Encounter Outcome:  Pt. Visit Completed

## 2023-03-29 NOTE — Patient Instructions (Signed)
Visit Information  Thank you for taking time to visit with me today. Please don't hesitate to contact me if I can be of assistance to you.   Following are the goals we discussed today:   Goals Addressed             This Visit's Progress    "I need transportation to my doctor's appointments"       Activities and task to complete in order to accomplish goals.   Patient approved for [facility] [phone] patient agreeable to call to schedule rides to medical appointments Transportation arranged for PCP appointment on 04/05/23        If you are experiencing a Mental Health or Behavioral Health Crisis or need someone to talk to, please call the Suicide and Crisis Lifeline: 988   Patient verbalizes understanding of instructions and care plan provided today and agrees to view in MyChart. Active MyChart status and patient understanding of how to access instructions and care plan via MyChart confirmed with patient.     No further follow up required: patient to contact [facility] to arrange transport to medical appointments  [employer], [place] Clinical Social Worker  [facility] Care Management [phone]

## 2023-04-05 ENCOUNTER — Ambulatory Visit (INDEPENDENT_AMBULATORY_CARE_PROVIDER_SITE_OTHER): Payer: Medicaid Other | Admitting: Physician Assistant

## 2023-04-05 ENCOUNTER — Encounter: Payer: Self-pay | Admitting: Physician Assistant

## 2023-04-05 VITALS — BP 108/82 | HR 89

## 2023-04-05 DIAGNOSIS — E1159 Type 2 diabetes mellitus with other circulatory complications: Secondary | ICD-10-CM

## 2023-04-05 DIAGNOSIS — E785 Hyperlipidemia, unspecified: Secondary | ICD-10-CM

## 2023-04-05 DIAGNOSIS — C61 Malignant neoplasm of prostate: Secondary | ICD-10-CM

## 2023-04-05 DIAGNOSIS — E1169 Type 2 diabetes mellitus with other specified complication: Secondary | ICD-10-CM

## 2023-04-05 DIAGNOSIS — D638 Anemia in other chronic diseases classified elsewhere: Secondary | ICD-10-CM

## 2023-04-05 DIAGNOSIS — R600 Localized edema: Secondary | ICD-10-CM

## 2023-04-05 DIAGNOSIS — I152 Hypertension secondary to endocrine disorders: Secondary | ICD-10-CM

## 2023-04-05 DIAGNOSIS — C7951 Secondary malignant neoplasm of bone: Secondary | ICD-10-CM

## 2023-04-05 DIAGNOSIS — Z8739 Personal history of other diseases of the musculoskeletal system and connective tissue: Secondary | ICD-10-CM

## 2023-04-05 DIAGNOSIS — E119 Type 2 diabetes mellitus without complications: Secondary | ICD-10-CM | POA: Diagnosis not present

## 2023-04-05 NOTE — Progress Notes (Unsigned)
New patient visit   Patient: Roger [name]   DOB: 09/19/1956   67 y.o. [name]  MRN: [id number] Visit Date: 04/05/2023  Today's healthcare provider: Alfredia Ferguson, PA-C   No chief complaint on file.  Subjective    JOSEPH CARLSTROM is a 67 y.o. [name] who presents today as a new patient to establish care.  HPI  Palliative care but no hospice ;  Past Medical History:  Diagnosis Date   Chest pain    a. 04/2016 MV: small, mild apical defect c/w apical ischemia, EF 55-65%-->Low risk study.   Diabetes mellitus without complication (HCC)    Gout    Hematochezia    History of echocardiogram    a. 04/2016 Echo: EF 55-60%, mild LVH, no rwma, midly dil LA.   History of heart attack    Hypertension    Morbid obesity (HCC)    Prostate cancer (HCC)    Thrombocytopenia (HCC) 09/28/2022   Past Surgical History:  Procedure Laterality Date   COLONOSCOPY WITH PROPOFOL N/A 08/05/2018   Procedure: COLONOSCOPY WITH PROPOFOL;  Surgeon: Toney Reil, MD;  Location: [facility] ENDOSCOPY;  Service: Gastroenterology;  Laterality: N/A;   thumb surgery     Family Status  Relation Name Status   Mother  Alive   Father  Alive   Other  (Not Specified)   Family History  Problem Relation Age of Onset   Heart Problems Mother    Breast cancer Mother    Mental illness Father    Heart disease Father    Stroke Father    Diabetes Father    Alcohol abuse Father    Bipolar disorder Father    Heart disease Other    Hypertension Other    Diabetes Other    Social History   Socioeconomic History   Marital status: Divorced    Spouse name: Not on file   Number of children: 5   Years of education: Not on file   Highest education level: High school graduate  Occupational History   Not on file  Tobacco Use   Smoking status: Former    Packs/day: 0.25    Years: 25.00    Additional pack years: 0.00    Total pack years: 6.25    Types: Cigarettes    Quit date: 74    Years since quitting: 30.3     Passive exposure: Past   Smokeless tobacco: Never  Vaping Use   Vaping Use: Never used  Substance and Sexual Activity   Alcohol use: Yes    Alcohol/week: 42.0 standard drinks of alcohol    Types: 10 Cans of beer, 32 Shots of liquor per week    Comment: everyday. 3-5 servings whiskey nightly & beer   Drug use: No   Sexual activity: Not Currently  Other Topics Concern   Not on file  Social History Narrative   Lives at SNF.   Social Determinants of Health   Financial Resource Strain: High Risk (11/28/2022)   Overall Financial Resource Strain (CARDIA)    Difficulty of Paying Living Expenses: Very hard  Food Insecurity: Food Insecurity Present (02/26/2023)   Hunger Vital Sign    Worried About Running Out of Food in the Last Year: Sometimes true    Ran Out of Food in the Last Year: Sometimes true  Transportation Needs: Unmet Transportation Needs (02/08/2023)   PRAPARE - Administrator, Civil Service (Medical): No    Lack of Transportation (Non-Medical): Yes  Physical  Activity: Inactive (11/28/2022)   Exercise Vital Sign    Days of Exercise per Week: 0 days    Minutes of Exercise per Session: 0 min  Stress: Stress Concern Present (11/30/2022)   [employer] of Occupational Health - Occupational Stress Questionnaire    Feeling of Stress : Rather much  Social Connections: Socially Isolated (11/30/2022)   Social Connection and Isolation Panel [NHANES]    Frequency of Communication with Friends and Family: More than three times a week    Frequency of Social Gatherings with Friends and Family: Twice a week    Attends Religious Services: Never    Database administrator or Organizations: No    Attends Banker Meetings: Never    Marital Status: Divorced   Outpatient Medications Prior to Visit  Medication Sig   apalutamide (ERLEADA) 240 MG tablet Take 1 tablet (240 mg total) by mouth daily. (Patient not taking: Reported on 02/08/2023)   isosorbide mononitrate (IMDUR)  30 MG 24 hr tablet TAKE 1 TABLET(30 MG) BY MOUTH DAILY   lisinopril (ZESTRIL) 5 MG tablet Take 1 tablet (5 mg total) by mouth daily.   rosuvastatin (CRESTOR) 10 MG tablet Take 1 tablet (10 mg total) by mouth daily.   No facility-administered medications prior to visit.   Allergies  Allergen Reactions   Albuterol Nausea And Vomiting   Mucinex [Guaifenesin Er] Rash     There is no immunization history on file for this patient.  Health Maintenance  Topic Date Due   COVID-19 Vaccine (1) Never done   Hepatitis C Screening  Never done   DTaP/Tdap/Td (1 - Tdap) Never done   Zoster Vaccines- Shingrix (1 of 2) Never done   OPHTHALMOLOGY EXAM  11/28/2017   FOOT EXAM  04/19/2019   COLONOSCOPY (Pts 45-2yrs Insurance coverage will need to be confirmed)  08/05/2021   HEMOGLOBIN A1C  03/28/2023   INFLUENZA VACCINE  06/27/2023   HIV Screening  Completed   HPV VACCINES  Aged Out    Patient Care Team: Casimer Lanius, MD as PCP - General (Rheumatology)  Review of Systems  {Labs  Heme  Chem  Endocrine  Serology  Results Review (optional):23779}   Objective    There were no vitals taken for this visit. {Show previous vital signs (optional):23777}  Physical Exam ***  Depression Screen    11/28/2022    5:02 PM 07/17/2019   10:26 AM 04/18/2018    2:30 PM  PHQ 2/9 Scores  PHQ - 2 Score 0 0 0   No results found for any visits on 04/05/23.  Assessment & Plan     ***  No follow-ups on file.     I, Alfredia Ferguson, PA-C have reviewed all documentation for this visit. The documentation on  04/05/23   for the exam, diagnosis, procedures, and orders are all accurate and complete.  Alfredia Ferguson, PA-C [facility] [address] [place], [place], [address] Office: [phone] Fax: [phone]   [facility]

## 2023-04-08 ENCOUNTER — Telehealth: Payer: Self-pay | Admitting: *Deleted

## 2023-04-08 ENCOUNTER — Encounter: Payer: Self-pay | Admitting: Physician Assistant

## 2023-04-08 ENCOUNTER — Encounter: Payer: Self-pay | Admitting: Licensed Clinical Social Worker

## 2023-04-08 DIAGNOSIS — Z8739 Personal history of other diseases of the musculoskeletal system and connective tissue: Secondary | ICD-10-CM | POA: Insufficient documentation

## 2023-04-08 LAB — COMPREHENSIVE METABOLIC PANEL
ALT: 12 IU/L (ref 0–44)
AST: 32 IU/L (ref 0–40)
Albumin/Globulin Ratio: 1.2 (ref 1.2–2.2)
Albumin: 2.8 g/dL — ABNORMAL LOW (ref 3.9–4.9)
Alkaline Phosphatase: 192 IU/L — ABNORMAL HIGH (ref 44–121)
BUN/Creatinine Ratio: 10 (ref 10–24)
BUN: 8 mg/dL (ref 8–27)
Bilirubin Total: 0.9 mg/dL (ref 0.0–1.2)
CO2: 17 mmol/L — ABNORMAL LOW (ref 20–29)
Calcium: 7.8 mg/dL — ABNORMAL LOW (ref 8.6–10.2)
Chloride: 101 mmol/L (ref 96–106)
Creatinine, Ser: 0.82 mg/dL (ref 0.76–1.27)
Globulin, Total: 2.3 g/dL (ref 1.5–4.5)
Glucose: 88 mg/dL (ref 70–99)
Potassium: 3.4 mmol/L — ABNORMAL LOW (ref 3.5–5.2)
Sodium: 138 mmol/L (ref 134–144)
Total Protein: 5.1 g/dL — ABNORMAL LOW (ref 6.0–8.5)
eGFR: 98 mL/min/{1.73_m2} (ref >59)

## 2023-04-08 LAB — CBC WITH DIFFERENTIAL/PLATELET
Basophils Absolute: 0 10*3/uL (ref 0.0–0.2)
Basos: 0 %
EOS (ABSOLUTE): 0.1 10*3/uL (ref 0.0–0.4)
Eos: 1 %
Hematocrit: 18.2 % — ABNORMAL LOW (ref 37.5–51.0)
Hemoglobin: 6.1 g/dL — CL (ref 13.0–17.7)
Immature Grans (Abs): 0 10*3/uL (ref 0.0–0.1)
Immature Granulocytes: 0 %
Lymphocytes Absolute: 2 10*3/uL (ref 0.7–3.1)
Lymphs: 30 %
MCH: 32.6 pg (ref 26.6–33.0)
MCHC: 33.5 g/dL (ref 31.5–35.7)
MCV: 97 fL (ref 79–97)
Monocytes Absolute: 0.3 10*3/uL (ref 0.1–0.9)
Monocytes: 4 %
Neutrophils Absolute: 4.3 10*3/uL (ref 1.4–7.0)
Neutrophils: 65 %
Platelets: 169 10*3/uL (ref 150–450)
RBC: 1.87 x10E6/uL — CL (ref 4.14–5.80)
RDW: 17 % — ABNORMAL HIGH (ref 11.6–15.4)
WBC: 6.8 10*3/uL (ref 3.4–10.8)

## 2023-04-08 LAB — LIPID PANEL
Chol/HDL Ratio: 5.5 ratio — ABNORMAL HIGH (ref 0.0–5.0)
Cholesterol, Total: 120 mg/dL (ref 100–199)
HDL: 22 mg/dL — ABNORMAL LOW (ref >39)
LDL Chol Calc (NIH): 56 mg/dL (ref 0–99)
Triglycerides: 260 mg/dL — ABNORMAL HIGH (ref 0–149)
VLDL Cholesterol Cal: 42 mg/dL — ABNORMAL HIGH (ref 5–40)

## 2023-04-08 LAB — HEMOGLOBIN A1C
Est. average glucose Bld gHb Est-mCnc: 80 mg/dL
Hgb A1c MFr Bld: 4.4 % — ABNORMAL LOW (ref 4.8–5.6)

## 2023-04-08 LAB — BRAIN NATRIURETIC PEPTIDE: BNP: 203.5 pg/mL — ABNORMAL HIGH (ref 0.0–100.0)

## 2023-04-08 LAB — URIC ACID: Uric Acid: 8.3 mg/dL (ref 3.8–8.4)

## 2023-04-08 NOTE — Assessment & Plan Note (Signed)
Followed but uro/onc. Pt on palliative care.

## 2023-04-08 NOTE — Assessment & Plan Note (Signed)
Pt is unmediated, denies recent flares. Will check uric acid/ cmp. Would only treat to reduce symptoms/improve quality of life, and pt reports no recent flares

## 2023-04-08 NOTE — Patient Instructions (Signed)
Visit Information  Thank you for taking time to visit with me today. Please don't hesitate to contact me if I can be of assistance to you.   Following are the goals we discussed today:   Goals Addressed             This Visit's Progress    "I am open to getting some help at home"       Care Coordination Interventions: Confirmed that patient is open to personal care services-request form to be faxed to patient's provider for completion           Our next appointment is by telephone on 04/23/23 at 3pm  Please call the care guide team at [phone] if you need to cancel or reschedule your appointment.   If you are experiencing a Mental Health or Behavioral Health Crisis or need someone to talk to, please call 911   Patient verbalizes understanding of instructions and care plan provided today and agrees to view in MyChart. Active MyChart status and patient understanding of how to access instructions and care plan via MyChart confirmed with patient.     Telephone follow up appointment with care management team member scheduled for: 04/23/23  Verna Czech, LCSW Clinical Social Worker  [facility] Care Management [phone]

## 2023-04-08 NOTE — Assessment & Plan Note (Signed)
Followed by onco/heme.  Will repeat cbc today

## 2023-04-08 NOTE — Assessment & Plan Note (Signed)
Lisinopril and imdur are both on pt's list but he reports he does not take them . Last cardio visit 2018. Currently well controlled. Given pt is on palliative care, will only treat/refer if exacerbation.  Appears euvolemic today

## 2023-04-08 NOTE — Progress Notes (Signed)
[facility] CSW Progress Note  Clinical Child psychotherapist  spoke with [facility] CSW  to discuss patient's need.  [facility] CSW stated she will contact the patient and discuss Medicaid application status and other available resources.    Joseph Art, LCSW Clinical Social Worker [facility]

## 2023-04-08 NOTE — Patient Outreach (Signed)
  Care Coordination   Follow Up Visit Note   04/08/2023 Name: Roger [name] MRN: [id number] DOB: [birth year]  Roger [name] is a 67 y.o. year old male who sees Ok Edwards, Lillia Abed, [place] for primary care. I spoke with  Roger [name] by phone today.  What matters to the patients health and wellness today?  Patient states that transportation to medical appointment went well. Patient confirms needing assistance in the home, applying for personal care services accepted.   Goals Addressed             This Visit's Progress    "I am open to getting some help at home"       Care Coordination Interventions: Confirmed that patient is open to personal care services-request form to be faxed to patient's provider for completion           SDOH assessments and interventions completed:  No     Care Coordination Interventions:  Yes, provided  Interventions Today    Flowsheet Row Most Recent Value  Chronic Disease   Chronic disease during today's visit Diabetes, Hypertension (HTN), Other  [Prostate cancer]  General Interventions   General Interventions Discussed/Reviewed General Interventions Reviewed, [employer], Level of Care  Level of Care Personal Care Services  [patient requesting assistance with ADL's-will request that patient's provider complete request form for an assessment for personal care services-per patient, he has full Medicaid]  Education Interventions   Education Provided Provided Education  Provided Verbal Education On [employer]  [patient educated on application process for personal care services]       Follow up plan: Follow up call scheduled for 04/23/23    Encounter Outcome:  Pt. Visit Completed

## 2023-04-08 NOTE — Assessment & Plan Note (Signed)
Historically. Pt reports not taking his statin. Will withhold

## 2023-04-08 NOTE — Assessment & Plan Note (Signed)
Historically. Unmedicated. Will get cmp/a1c

## 2023-04-09 ENCOUNTER — Encounter: Payer: Self-pay | Admitting: *Deleted

## 2023-04-09 NOTE — Patient Outreach (Signed)
  Care Coordination   Care Coordination  Visit Note   04/09/2023 late entry Name: Roger [name] MRN: [id number] DOB: 13-Sep-1956  Roger [name] is a 67 y.o. year old male who sees Ok Edwards, Lillia Abed, [place] for primary care. I  [facility]/23 to discuss options for in home care  What matters to the patients health and wellness today?  In home care options to assist with patient's daily care needs    Goals Addressed             This Visit's Progress    "I am open to getting some help at home"       Care Coordination Interventions: PCP requesting that Oncology follow up with patient, in home care options will be based on this follow up            SDOH assessments and interventions completed:  No     Care Coordination Interventions:  Yes, provided  Interventions Today    Flowsheet Row Most Recent Value  Chronic Disease   Chronic disease during today's visit Diabetes, Hypertension (HTN)  General Interventions   General Interventions Discussed/Reviewed Communication with  Communication with Social Work, PCP/Specialists  [Patient's PCP would like assistance with available resources for in home care and follow up with Oncologist for possible referral for hospice care]  Level of Care Personal Care Services  [Patient's medicaid coverage discussed, as well as available resources to assist with patient's care in the home]       Follow up plan: Follow up call scheduled for 04/23/23    Encounter Outcome:  Pt. Visit Completed

## 2023-04-23 ENCOUNTER — Encounter: Payer: Self-pay | Admitting: *Deleted

## 2023-04-23 ENCOUNTER — Telehealth: Payer: Self-pay | Admitting: *Deleted

## 2023-04-23 NOTE — Patient Outreach (Signed)
  Care Coordination   04/23/2023 Name: Roger [name] MRN: [id number] DOB: 1956/10/06   Care Coordination Outreach Attempts:  An unsuccessful telephone outreach was attempted for a scheduled appointment today.  Follow Up Plan:  Additional outreach attempts will be made to offer the patient care coordination information and services.   Encounter Outcome:  No Answer   Care Coordination Interventions:  No, not indicated    Brecklyn Galvis, LCSW Clinical Social Worker  [facility] Care Manager [phone]

## 2023-04-24 ENCOUNTER — Inpatient Hospital Stay: Payer: Medicaid Other | Attending: Oncology | Admitting: Oncology

## 2023-04-24 ENCOUNTER — Inpatient Hospital Stay: Payer: Medicaid Other

## 2023-04-24 ENCOUNTER — Encounter: Payer: Self-pay | Admitting: Oncology

## 2023-04-24 ENCOUNTER — Other Ambulatory Visit: Payer: Self-pay | Admitting: Oncology

## 2023-04-24 VITALS — BP 109/81 | HR 79 | Temp 97.5°F | Resp 18 | Wt 168.7 lb

## 2023-04-24 DIAGNOSIS — D649 Anemia, unspecified: Secondary | ICD-10-CM | POA: Diagnosis not present

## 2023-04-24 DIAGNOSIS — C61 Malignant neoplasm of prostate: Secondary | ICD-10-CM

## 2023-04-24 DIAGNOSIS — E538 Deficiency of other specified B group vitamins: Secondary | ICD-10-CM | POA: Diagnosis not present

## 2023-04-24 DIAGNOSIS — C7951 Secondary malignant neoplasm of bone: Secondary | ICD-10-CM | POA: Insufficient documentation

## 2023-04-24 DIAGNOSIS — D638 Anemia in other chronic diseases classified elsewhere: Secondary | ICD-10-CM

## 2023-04-24 LAB — CBC WITH DIFFERENTIAL/PLATELET
Abs Immature Granulocytes: 0.03 10*3/uL (ref 0.00–0.07)
Basophils Absolute: 0 10*3/uL (ref 0.0–0.1)
Basophils Relative: 0 %
Eosinophils Absolute: 0 10*3/uL (ref 0.0–0.5)
Eosinophils Relative: 0 %
HCT: 20.4 % — ABNORMAL LOW (ref 39.0–52.0)
Hemoglobin: 6.6 g/dL — CL (ref 13.0–17.0)
Immature Granulocytes: 1 %
Lymphocytes Relative: 25 %
Lymphs Abs: 1.6 10*3/uL (ref 0.7–4.0)
MCH: 34 pg (ref 26.0–34.0)
MCHC: 32.4 g/dL (ref 30.0–36.0)
MCV: 105.2 fL — ABNORMAL HIGH (ref 80.0–100.0)
Monocytes Absolute: 0.2 10*3/uL (ref 0.1–1.0)
Monocytes Relative: 3 %
Neutro Abs: 4.5 10*3/uL (ref 1.7–7.7)
Neutrophils Relative %: 71 %
Platelets: 184 10*3/uL (ref 150–400)
RBC: 1.94 MIL/uL — ABNORMAL LOW (ref 4.22–5.81)
RDW: 18 % — ABNORMAL HIGH (ref 11.5–15.5)
WBC: 6.3 10*3/uL (ref 4.0–10.5)
nRBC: 0 % (ref 0.0–0.2)

## 2023-04-24 LAB — IRON AND TIBC: Iron: 75 ug/dL (ref 45–182)

## 2023-04-24 LAB — TYPE AND SCREEN
ABO/RH(D): O POS
Antibody Screen: NEGATIVE

## 2023-04-24 LAB — FOLATE: Folate: 3.6 ng/mL — ABNORMAL LOW (ref >5.9)

## 2023-04-24 LAB — RETIC PANEL
Immature Retic Fract: 19.1 % — ABNORMAL HIGH (ref 2.3–15.9)
RBC.: 1.92 MIL/uL — ABNORMAL LOW (ref 4.22–5.81)
Retic Count, Absolute: 63.7 10*3/uL (ref 19.0–186.0)
Retic Ct Pct: 3.3 % — ABNORMAL HIGH (ref 0.4–3.1)
Reticulocyte Hemoglobin: 36.1 pg (ref >27.9)

## 2023-04-24 LAB — PREPARE RBC (CROSSMATCH)

## 2023-04-24 LAB — VITAMIN B12: Vitamin B-12: 993 pg/mL — ABNORMAL HIGH (ref 180–914)

## 2023-04-24 LAB — BPAM RBC

## 2023-04-24 LAB — SAMPLE TO BLOOD BANK

## 2023-04-24 LAB — PSA: Prostatic Specific Antigen: 0.56 ng/mL (ref 0.00–4.00)

## 2023-04-24 LAB — LACTATE DEHYDROGENASE: LDH: 104 U/L (ref 98–192)

## 2023-04-24 LAB — FERRITIN: Ferritin: 1290 ng/mL — ABNORMAL HIGH (ref 24–336)

## 2023-04-24 MED ORDER — FOLIC ACID 1 MG PO TABS: 1.0000 mg | ORAL_TABLET | Freq: Every day | ORAL | 0 refills | Status: DC

## 2023-04-24 NOTE — Addendum Note (Signed)
Addended by: Rickard Patience on: 04/24/2023 07:20 PM   Modules accepted: Orders

## 2023-04-24 NOTE — Progress Notes (Signed)
Hematology/Oncology Progress note [phone] [phone]    ASSESSMENT & PLAN:   Cancer Staging  Prostate cancer metastatic to bone [facility]) Staging form: Prostate, AJCC 8th Edition - Clinical stage from 10/31/2022: Stage IVB (cN1, cM1b, PSA: 1500) - Signed by Rickard Patience, MD on 11/17/2022   Prostate cancer metastatic to bone [facility]) CT images and pathology were reviewed with patient.  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  Recommend to continue androgen deprivation therapy-Eligard 45mg  Q71m, last given on 12/05/2022 by urology He is a not a candidate for Decetaxel treatment. Repeat PSA today showed levels of 0.56.  Likely disease is still castration sensitive. If patient is interested in further treatments, recommend ARPI, can consider resuming Apalutamide 240mg  daily.   Refer to palliative care for further discussion.    Symptomatic anemia Hemoglobin today 6.6, recommend 1 unit of PRBC transfusion  Anemia, chronic disease I recommend to check CBC, iron, TIBC ferritin, folate, B12, reticulocyte panel, LDH, haptoglobin.  Multiple myeloma panel, light chain ratio.  Folate deficiency Recommend patient to take folic acid 1 mg daily supplementation.    Orders Placed This Encounter  Procedures   Ferritin    Standing Status:   Future    Number of Occurrences:   1    Standing Expiration Date:   10/25/2023   Iron and TIBC    Standing Status:   Future    Number of Occurrences:   1    Standing Expiration Date:   04/23/2024   Folate    Standing Status:   Future    Number of Occurrences:   1    Standing Expiration Date:   04/23/2024   Vitamin B12    Standing Status:   Future    Number of Occurrences:   1    Standing Expiration Date:   04/23/2024   CBC with Differential/Platelet    Standing Status:   Future    Number of Occurrences:   1    Standing Expiration Date:   04/23/2024   Retic Panel    Standing Status:    Future    Number of Occurrences:   1    Standing Expiration Date:   04/23/2024   Lactate dehydrogenase    Standing Status:   Future    Number of Occurrences:   1    Standing Expiration Date:   04/23/2024   Haptoglobin    Standing Status:   Future    Number of Occurrences:   1    Standing Expiration Date:   04/23/2024   PSA    Standing Status:   Future    Number of Occurrences:   1    Standing Expiration Date:   04/23/2024   Multiple Myeloma Panel (SPEP&IFE w/QIG)    Standing Status:   Future    Number of Occurrences:   1    Standing Expiration Date:   04/23/2024   Kappa/lambda light chains    Standing Status:   Future    Number of Occurrences:   1    Standing Expiration Date:   04/23/2024   Hold Tube- Blood Bank    Standing Status:   Future    Number of Occurrences:   1    Standing Expiration Date:   04/23/2024   RTC Repeat CBC weekly +/- PRBC transfusion x 2 Follow in 3 weeks.    All questions were answered. The patient knows to call the clinic with any problems, questions or concerns.  Rickard Patience, MD, PhD [facility] Hematology Oncology 04/24/2023   CHIEF COMPLAINTS/PURPOSE OF CONSULTATION:  Metastatic prostate cancer.   INTERVAL HISTORY Roger [name] is a 67 y.o. male who has above history reviewed by me today presents for follow up visit for metastatic prostate cancer.   Oncology History  Prostate cancer metastatic to bone (HCC)  09/08/2022 Imaging   CT abdomen pelvis w contrast showed  1. Mild bilateral hydronephrosis and distended urinary bladder, consistent with bladder outlet obstruction and hydronephrosis secondary to back pressure. No urinary tract calculi. 2. Prostatomegaly with indistinct margins, particularly at the prostatic base. 3. Numerous enlarged bilateral pelvic sidewall, iliac, and retroperitoneal lymph nodes. 4. 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 5. Constellation of findings is consistent  prostate malignancy and associated metastatic disease. 6. Hepatic steatosis. 7. Cholelithiasis without evidence of acute cholecystitis. 8. Pancolonic diverticulosis without evidence of acute diverticulitis.Aortic Atherosclerosis   09/08/2022 Tumor Marker   PSA 1500   09/27/2022 Initial Diagnosis   Prostate cancer metastatic to bone  09/08/22 Patient recently presented to ER for evaluation of urinary difficulty, gout flare/foot swelling.  Lower extremity US in ER negative for blood clot. 09/08/22 CT scan showed hydronephrosis, prostatomegaly, severe pelvic lymphadenopathy, widespread sclerotic osseous lesions. A Foley catheter was placed and patient followed up with urology Dr.Brandon on 09/20/22, PSMA PET was ordered, and planned to get biopsy for tissue diagnosis.  09/24/22 established care with oncology  His work up for prostate cancer was delayed due to hospitalization due to severe sepsis and later again due to chest pain work up in ED.   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  Blood cultures grewout E. coli.  Urine culture also positive for E. Coli, Klebsiella oxytoca, Enterococcus faecalis.  He was treated with antibiotics Rocephin>changed to Unasyn.  Planned lymphadenopathy biopsy was delayed due to severe sepsis.  He was also transfused with PRBC 1 unit due to acute drop of hemoglobin.     10/17/2022 Imaging   CT angio chest pulmonary embolism protocol 1. No evidence acute pulmonary embolism. 2. Widespread sclerotic skeletal metastasis similar to CT 2014. 3. Lymph node along the descending thoracic aorta could represent a metastatic prostate cancer lymph node. 4. Lobular soft tissue on either side of the first RIGHT rib and sternal junction associated  with erosion of the joint. Favor inflammatory arthropathy rather than a metastatic lesion. Patient's history of gout which may contribute to this finding.   10/31/2022 Cancer Staging   Staging form: Prostate, AJCC 8th Edition - Clinical stage from 10/31/2022: Stage IVB (cN1, cM1b, PSA: 1500) - Signed by Rickard Patience, MD on 11/17/2022 Stage prefix: Initial diagnosis Prostate specific antigen (PSA) range: 20 or greater   10/31/2022 Miscellaneous   Started on loading dose of Firmagon at urologist's office 12/05/2022, patient got Eligard 45 mg injection.     INTERVAL HISTORY Roger [name] is a 67 y.o. male who has above history reviewed by me today presents for follow up visit  Today he was referred back to hematology oncology for reestablish of care due to anemia. Patient was last seen by me on 11/15/2022.  At that time, patient has been started on androgen deprivation therapy and there was plan for him to start on Apalutamide 240 mg daily, managed by  Dr. Apolinar Junes. Per my discussion with Dr. Apolinar Junes, urology will manage antigen deprivation therapy as well as the Apalutamide until he develops castration resistant disease. Patient is a poor historian.  He cannot tell me how long he stayed on Apalutamide  He is on androgen deprivation therapy, Eligard was last given on 12/05/2022. 02/07/2023 - 02/11/2023, patient was admitted to the hospital due to sepsis secondary to UTI, C. difficile patient was treated with antibiotics, during hospitalization, patient has also developed worsening of anemia, stool for occult negative, status post 2 units of PRBC transfusion. Patient was discharged home. 02/18/2023, patient request to being go to hospice.  Hospice referral was placed by urology  Today patient reports feeling tired and fatigued.  Denies any blood in the stool. 04/05/2023, patient had blood work done, obtained by primary care provider.  Hemoglobin was 6.6 Patient's friend Quasean Pinho was called by patient  during the encounter and participate in the discussion. Per patient and his friend, patient wanted to be involved to hospice primary as he wants to get some help at home. Currently he is interested in revoking hospice and may consider further prostate cancer treatments.  MEDICAL HISTORY:  Past Medical History:  Diagnosis Date   Chest pain    a. 04/2016 MV: small, mild apical defect c/w apical ischemia, EF 55-65%-->Low risk study.   Diabetes mellitus without complication (HCC)    Gout    Hematochezia    History of echocardiogram    a. 04/2016 Echo: EF 55-60%, mild LVH, no rwma, midly dil LA.   History of heart attack    Hypertension    Morbid obesity (HCC)    Prostate cancer (HCC)    Thrombocytopenia (HCC) 09/28/2022    SURGICAL HISTORY: Past Surgical History:  Procedure Laterality Date   COLONOSCOPY WITH PROPOFOL N/A 08/05/2018   Procedure: COLONOSCOPY WITH PROPOFOL;  Surgeon: Toney Reil, MD;  Location: [facility] ENDOSCOPY;  Service: Gastroenterology;  Laterality: N/A;   thumb surgery      SOCIAL HISTORY: Social History   Socioeconomic History   Marital status: Divorced    Spouse name: Not on file   Number of children: 5   Years of education: Not on file   Highest education level: High school graduate  Occupational History   Not on file  Tobacco Use   Smoking status: Former    Packs/day: 0.25    Years: 25.00    Additional pack years: 0.00    Total pack years: 6.25    Types: Cigarettes    Quit date: 51    Years since quitting: 30.4    Passive exposure: Past   Smokeless tobacco: Never  Vaping Use   Vaping Use: Never used  Substance and Sexual Activity   Alcohol use: Yes    Alcohol/week: 42.0 standard drinks of alcohol    Types: 10 Cans of beer, 32 Shots of liquor per week    Comment: everyday. 3-5 servings whiskey nightly & beer   Drug use: No   Sexual activity: Not Currently  Other Topics Concern   Not on file  Social History Narrative   Lives at SNF.    Social Determinants of Health   Financial Resource Strain: High Risk (11/28/2022)   Overall Financial Resource Strain (CARDIA)    Difficulty of Paying Living Expenses: Very hard  Food Insecurity: Food Insecurity Present (02/26/2023)   Hunger Vital Sign    Worried About Running Out of Food in the Last Year: Sometimes true    Ran  Out of Food in the Last Year: Sometimes true  Transportation Needs: Unmet Transportation Needs (02/08/2023)   PRAPARE - Transportation    Lack of Transportation (Medical): No    Lack of Transportation (Non-Medical): Yes  Physical Activity: Inactive (11/28/2022)   Exercise Vital Sign    Days of Exercise per Week: 0 days    Minutes of Exercise per Session: 0 min  Stress: Stress Concern Present (11/30/2022)   [employer] of Occupational Health - Occupational Stress Questionnaire    Feeling of Stress : Rather much  Social Connections: Socially Isolated (11/30/2022)   Social Connection and Isolation Panel [NHANES]    Frequency of Communication with Friends and Family: More than three times a week    Frequency of Social Gatherings with Friends and Family: Twice a week    Attends Religious Services: Never    Database administrator or Organizations: No    Attends Banker Meetings: Never    Marital Status: Divorced  Catering manager Violence: Not At Risk (02/08/2023)   Humiliation, Afraid, Rape, and Kick questionnaire    Fear of Current or Ex-Partner: No    Emotionally Abused: No    Physically Abused: No    Sexually Abused: No    FAMILY HISTORY: Family History  Problem Relation Age of Onset   Heart Problems Mother    Breast cancer Mother    Mental illness Father    Heart disease Father    Stroke Father    Diabetes Father    Alcohol abuse Father    Bipolar disorder Father    Heart disease Other    Hypertension Other    Diabetes Other     ALLERGIES:  is allergic to albuterol and mucinex [guaifenesin er].  MEDICATIONS:  Current Outpatient  Medications  Medication Sig Dispense Refill   loperamide (IMODIUM) 2 MG capsule Take 2 mg by mouth as needed.     LORazepam (ATIVAN) 0.5 MG tablet Take 0.5 mg by mouth every 4 (four) hours as needed.     oxyCODONE (OXY IR/ROXICODONE) 5 MG immediate release tablet Take 5 mg by mouth every 4 (four) hours as needed.     nitroGLYCERIN (NITROSTAT) 0.4 MG SL tablet Place 0.4 mg under the tongue every 5 (five) minutes as needed. (Patient not taking: Reported on 04/24/2023)     No current facility-administered medications for this visit.    Review of Systems  Constitutional:  Positive for fatigue. Negative for appetite change, chills, fever and unexpected weight change.       Ambulation difficulty due to pain  HENT:   Negative for hearing loss and voice change.   Eyes:  Negative for eye problems and icterus.  Respiratory:  Negative for chest tightness, cough and shortness of breath.   Cardiovascular:  Negative for chest pain and leg swelling.  Gastrointestinal:  Negative for abdominal distention and abdominal pain.  Endocrine: Negative for hot flashes.  Musculoskeletal:  Positive for arthralgias.  Skin:  Negative for itching and rash.  Neurological:  Negative for light-headedness and numbness.  Hematological:  Negative for adenopathy. Does not bruise/bleed easily.  Psychiatric/Behavioral:  Negative for confusion.      PHYSICAL EXAMINATION: ECOG PERFORMANCE STATUS: 2 - Symptomatic, <50% confined to bed  Vitals:   04/24/23 1021  BP: 109/81  Pulse: 79  Resp: 18  Temp: (!) 97.5 F (36.4 C)   Filed Weights   04/24/23 1021  Weight: 168 lb 11.2 oz (76.5 kg)    Physical Exam Constitutional:  General: He is not in acute distress.    Appearance: He is obese. He is not diaphoretic.  HENT:     Head: Normocephalic.  Eyes:     General: No scleral icterus. Cardiovascular:     Rate and Rhythm: Normal rate.     Heart sounds: No murmur heard. Pulmonary:     Effort: Pulmonary effort is  normal. No respiratory distress.  Abdominal:     General: There is no distension.     Palpations: Abdomen is soft.  Musculoskeletal:        General: Normal range of motion.     Cervical back: Normal range of motion.  Skin:    General: Skin is warm and dry.     Findings: No erythema.  Neurological:     Mental Status: He is alert and oriented to person, place, and time. Mental status is at baseline.     Cranial Nerves: No cranial nerve deficit.     Motor: No abnormal muscle tone.  Psychiatric:        Mood and Affect: Mood and affect normal.      LABORATORY DATA:  I have reviewed the data as listed    Latest Ref Rng & Units 04/24/2023   11:23 AM 04/05/2023    4:31 PM 02/11/2023    4:18 AM  CBC  WBC 4.0 - 10.5 K/uL 6.3  6.8    Hemoglobin 13.0 - 17.0 g/dL 6.6  6.1  8.2   Hematocrit 39.0 - 52.0 % 20.4  18.2  24.5   Platelets 150 - 400 K/uL 184  169        Latest Ref Rng & Units 04/05/2023    4:31 PM 02/11/2023    4:18 AM 02/10/2023    5:10 AM  CMP  Glucose 70 - 99 mg/dL 88  93  85   BUN 8 - 27 mg/dL 8  12  14    Creatinine 0.76 - 1.27 mg/dL 1.61  0.96  0.45   Sodium 134 - 144 mmol/L 138  136  132   Potassium 3.5 - 5.2 mmol/L 3.4  3.5  3.1   Chloride 96 - 106 mmol/L 101  111  110   CO2 20 - 29 mmol/L 17  19  18    Calcium 8.6 - 10.2 mg/dL 7.8  7.7  7.3   Total Protein 6.0 - 8.5 g/dL 5.1   4.9   Total Bilirubin 0.0 - 1.2 mg/dL 0.9   2.8   Alkaline Phos 44 - 121 IU/L 192   289   AST 0 - 40 IU/L 32   104   ALT 0 - 44 IU/L 12   19     RADIOGRAPHIC STUDIES: I have personally reviewed the radiological images as listed and agreed with the findings in the report. No results found.

## 2023-04-24 NOTE — Assessment & Plan Note (Signed)
I recommend to check CBC, iron, TIBC ferritin, folate, B12, reticulocyte panel, LDH, haptoglobin.  Multiple myeloma panel, light chain ratio.

## 2023-04-24 NOTE — Assessment & Plan Note (Signed)
Hemoglobin today 6.6, recommend 1 unit of PRBC transfusion

## 2023-04-24 NOTE — Progress Notes (Signed)
Pt here per PCP request. Per pt's sig other, pt was referred back due to malnutrition and anemia.  Hemoglobin on 5/10 was 6.1, pt reports that he has not had a blood transfusion.

## 2023-04-24 NOTE — Assessment & Plan Note (Addendum)
CT images and pathology were reviewed with patient.  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  Recommend to continue androgen deprivation therapy-Eligard 45mg  Q41m, last given on 12/05/2022 by urology He is a not a candidate for Decetaxel treatment. Repeat PSA today showed levels of 0.56.  Likely disease is still castration sensitive. If patient is interested in further treatments, recommend ARPI, can consider resuming Apalutamide 240mg  daily.   Refer to palliative care for further discussion.

## 2023-04-24 NOTE — Addendum Note (Signed)
Addended by: Rickard Patience on: 04/24/2023 07:19 PM   Modules accepted: Orders

## 2023-04-24 NOTE — Assessment & Plan Note (Addendum)
Recommend patient to take folic acid 1 mg daily supplementation.

## 2023-04-25 ENCOUNTER — Telehealth: Payer: Self-pay

## 2023-04-25 ENCOUNTER — Inpatient Hospital Stay: Payer: Medicaid Other

## 2023-04-25 DIAGNOSIS — D649 Anemia, unspecified: Secondary | ICD-10-CM

## 2023-04-25 DIAGNOSIS — C61 Malignant neoplasm of prostate: Secondary | ICD-10-CM | POA: Diagnosis not present

## 2023-04-25 LAB — KAPPA/LAMBDA LIGHT CHAINS
Kappa free light chain: 28.3 mg/L — ABNORMAL HIGH (ref 3.3–19.4)
Kappa, lambda light chain ratio: 0.92 (ref 0.26–1.65)
Lambda free light chains: 30.6 mg/L — ABNORMAL HIGH (ref 5.7–26.3)

## 2023-04-25 LAB — TYPE AND SCREEN

## 2023-04-25 MED ORDER — SODIUM CHLORIDE 0.9% IV SOLUTION
250.0000 mL | Freq: Once | INTRAVENOUS | Status: AC
  Administered 2023-04-25: 250 mL via INTRAVENOUS
  Filled 2023-04-25: qty 250

## 2023-04-25 MED ORDER — DIPHENHYDRAMINE HCL 25 MG PO CAPS
25.0000 mg | ORAL_CAPSULE | Freq: Once | ORAL | Status: AC
  Administered 2023-04-25: 25 mg via ORAL
  Filled 2023-04-25: qty 1

## 2023-04-25 MED ORDER — ACETAMINOPHEN 325 MG PO TABS
650.0000 mg | ORAL_TABLET | Freq: Once | ORAL | Status: AC
  Administered 2023-04-25: 650 mg via ORAL
  Filled 2023-04-25: qty 2

## 2023-04-25 NOTE — Telephone Encounter (Signed)
Called patient and informed him that Dr. Cathie Hoops would like him to start folic acid daily and that Rx has been sent to pharmacy. I also informed patient that Dr. Cathie Hoops would like to do weekly labs +/- PRBC and follow-up on 6/20. I will have scheduling schedule these appointments and notify patient of appointment dates and times.

## 2023-04-25 NOTE — Telephone Encounter (Signed)
-----   Message from Rickard Patience, MD sent at 04/24/2023  7:21 PM EDT ----- Please advise patient to start folic acid 1mg  daily, Rx sent  Recommend lab weekly +/- D2 PRBC  Follow up on 6/20 lab MD +/- D2 PRBC  All labs are ordered. Thanks.

## 2023-04-26 ENCOUNTER — Inpatient Hospital Stay (HOSPITAL_BASED_OUTPATIENT_CLINIC_OR_DEPARTMENT_OTHER): Payer: Medicaid Other | Admitting: Hospice and Palliative Medicine

## 2023-04-26 DIAGNOSIS — C7951 Secondary malignant neoplasm of bone: Secondary | ICD-10-CM | POA: Diagnosis not present

## 2023-04-26 DIAGNOSIS — C61 Malignant neoplasm of prostate: Secondary | ICD-10-CM | POA: Diagnosis not present

## 2023-04-26 DIAGNOSIS — Z515 Encounter for palliative care: Secondary | ICD-10-CM

## 2023-04-26 LAB — BPAM RBC
Blood Product Expiration Date: 202406292359
Unit Type and Rh: 5100

## 2023-04-26 LAB — TYPE AND SCREEN: Unit division: 0

## 2023-04-26 LAB — HAPTOGLOBIN: Haptoglobin: 50 mg/dL (ref 32–363)

## 2023-04-26 NOTE — Progress Notes (Signed)
Virtual Visit via Telephone Note  I connected with Roger [name] on 04/26/23 at 11:30 AM EDT by telephone and verified that I am speaking with the correct person using two identifiers.  Location: Patient: Home Provider: Clinic   I discussed the limitations, risks, security and privacy concerns of performing an evaluation and management service by telephone and the availability of in person appointments. I also discussed with the patient that there may be a patient responsible charge related to this service. The patient expressed understanding and agreed to proceed.   History of Present Illness: Roger [name] is a 67 y.o. 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  Patient was referred to palliative care to address goals and manage ongoing symptoms.    Observations/Objective: Patient is currently being followed by hospice.  I spoke with Sheralyn Boatman - his hospice nurse.  Reportedly, patient has improved significantly.  When hospice for started following, patient was essentially bedbound.  He is now ambulatory and able to get outside the home.  He uses his riding lawnmower to drive down the street and increase his mobility.  Patient recently saw Dr. Cathie Hoops to discuss possible resumption of cancer treatment.  I spoke with patient today to address goals.  Patient feels like he has improved and is considering pursuing further treatment.  However, he says that at this point he is still undecided.  He also plans to speak with hospice but recognizes that he would require revocation of hospice to proceed with further treatment.  Patient states that he will let us know what he decides.  Symptomatically, he says he is doing reasonably well.  He denies any acute changes or concerns today.  Assessment and Plan: Metastatic prostate cancer -currently on hospice but patient is considering  revocation of hospice services to pursue further treatment.  He is still undecided but states that he will let us know soon.  Follow Up Instructions: Follow-up telephone visit 3 to 4 weeks   I discussed the assessment and treatment plan with the patient. The patient was provided an opportunity to ask questions and all were answered. The patient agreed with the plan and demonstrated an understanding of the instructions.   The patient was advised to call back or seek an in-person evaluation if the symptoms worsen or if the condition fails to improve as anticipated.  I provided 10 minutes of non-face-to-face time during this encounter.   Malachy Moan, NP

## 2023-05-01 ENCOUNTER — Telehealth: Payer: Self-pay | Admitting: Oncology

## 2023-05-01 ENCOUNTER — Inpatient Hospital Stay: Payer: 59

## 2023-05-01 NOTE — Telephone Encounter (Signed)
Pt called to cancel appts for 6/5 & 6/6. Stated "cancel the appts" and then hung up on me. I have cancelled appts.

## 2023-05-02 ENCOUNTER — Inpatient Hospital Stay: Payer: 59

## 2023-05-02 LAB — MULTIPLE MYELOMA PANEL, SERUM
Albumin SerPl Elph-Mcnc: 2.8 g/dL — ABNORMAL LOW (ref 2.9–4.4)
Albumin/Glob SerPl: 1.3 (ref 0.7–1.7)
Alpha 1: 0.2 g/dL (ref 0.0–0.4)
Alpha2 Glob SerPl Elph-Mcnc: 0.5 g/dL (ref 0.4–1.0)
B-Globulin SerPl Elph-Mcnc: 0.6 g/dL — ABNORMAL LOW (ref 0.7–1.3)
Gamma Glob SerPl Elph-Mcnc: 1.1 g/dL (ref 0.4–1.8)
Globulin, Total: 2.3 g/dL (ref 2.2–3.9)
IgA: 221 mg/dL (ref 61–437)
IgG (Immunoglobin G), Serum: 1165 mg/dL (ref 603–1613)
IgM (Immunoglobulin M), Srm: 27 mg/dL (ref 20–172)
Total Protein ELP: 5.1 g/dL — ABNORMAL LOW (ref 6.0–8.5)

## 2023-05-08 ENCOUNTER — Inpatient Hospital Stay: Payer: 59

## 2023-05-08 ENCOUNTER — Ambulatory Visit: Payer: Self-pay

## 2023-05-08 ENCOUNTER — Inpatient Hospital Stay: Payer: 59 | Attending: Hospice and Palliative Medicine

## 2023-05-08 DIAGNOSIS — C61 Malignant neoplasm of prostate: Secondary | ICD-10-CM | POA: Insufficient documentation

## 2023-05-08 DIAGNOSIS — D649 Anemia, unspecified: Secondary | ICD-10-CM

## 2023-05-08 LAB — CBC (CANCER CENTER ONLY)
HCT: 23.8 % — ABNORMAL LOW (ref 39.0–52.0)
Hemoglobin: 8.2 g/dL — ABNORMAL LOW (ref 13.0–17.0)
MCH: 33.7 pg (ref 26.0–34.0)
MCHC: 34.5 g/dL (ref 30.0–36.0)
MCV: 97.9 fL (ref 80.0–100.0)
Platelet Count: 239 10*3/uL (ref 150–400)
RBC: 2.43 MIL/uL — ABNORMAL LOW (ref 4.22–5.81)
RDW: 19.2 % — ABNORMAL HIGH (ref 11.5–15.5)
WBC Count: 9.4 10*3/uL (ref 4.0–10.5)
nRBC: 0 % (ref 0.0–0.2)

## 2023-05-08 LAB — SAMPLE TO BLOOD BANK

## 2023-05-08 NOTE — Telephone Encounter (Signed)
Patient called, left VM to return the call to the office to speak to the NT.   Summary: vomiting and diarrhea that have been going on for about a week.   Pt stated he is experiencing vomiting and diarrhea that have been going on for about a week. Pt stated that to come into the office, he needs transportation. Per Okey Regal, office transportation at [facility] needs to be scheduled five days in advance. Pt cannot do a virtual visit via video, and per office, telephone visits are not allowed.  Pt seeking clinical advice.

## 2023-05-08 NOTE — Telephone Encounter (Signed)
3rd attempt, Patient called, left VM to return the call to the office to speak to the NT. Unable to reach patient after 3 attempts by [facility] NT, routing to the provider for resolution per protocol.  

## 2023-05-08 NOTE — Telephone Encounter (Signed)
2nd attempt, Patient called, left VM to return the call to the office to speak to the NT.

## 2023-05-09 ENCOUNTER — Inpatient Hospital Stay: Payer: 59

## 2023-05-09 ENCOUNTER — Telehealth: Payer: Self-pay

## 2023-05-09 NOTE — Telephone Encounter (Signed)
Per [place] Distance driver went to pick up patietn today and pt "told Zenaida Niece drive rhe wasn't coming to get the treatmtent he was scheduled to get today." Appt will be cancelled.

## 2023-05-16 ENCOUNTER — Inpatient Hospital Stay: Payer: 59

## 2023-05-16 ENCOUNTER — Telehealth: Payer: Self-pay | Admitting: Oncology

## 2023-05-16 ENCOUNTER — Inpatient Hospital Stay: Payer: 59 | Admitting: Oncology

## 2023-05-16 NOTE — Telephone Encounter (Signed)
Pt called the Roger [name] to cancel his appts. Martie Lee let us know this information.  I called pt to r/s appts for today (lab/MD) and tomorrow (D2 PRBC)  Pt stated he did not want these appts and will only see the MD. Pt stated that he was sick after his last infusion.  I r/s MD appt to 7/18.

## 2023-05-17 ENCOUNTER — Inpatient Hospital Stay: Payer: 59 | Admitting: Hospice and Palliative Medicine

## 2023-05-17 ENCOUNTER — Inpatient Hospital Stay: Payer: 59

## 2023-05-24 ENCOUNTER — Telehealth: Payer: Medicaid Other | Admitting: Hospice and Palliative Medicine

## 2023-05-28 ENCOUNTER — Telehealth: Payer: Self-pay | Admitting: Physician Assistant

## 2023-05-28 ENCOUNTER — Inpatient Hospital Stay
Admission: EM | Admit: 2023-05-28 | Discharge: 2023-05-30 | DRG: 723 | Disposition: A | Discharge status: Hospice | Attending: Hospitalist | Admitting: Hospitalist

## 2023-05-28 ENCOUNTER — Emergency Department

## 2023-05-28 ENCOUNTER — Telehealth: Payer: Self-pay | Admitting: *Deleted

## 2023-05-28 ENCOUNTER — Ambulatory Visit: Payer: Self-pay | Admitting: *Deleted

## 2023-05-28 ENCOUNTER — Other Ambulatory Visit: Payer: Self-pay

## 2023-05-28 DIAGNOSIS — M109 Gout, unspecified: Secondary | ICD-10-CM | POA: Diagnosis present

## 2023-05-28 DIAGNOSIS — K529 Noninfective gastroenteritis and colitis, unspecified: Secondary | ICD-10-CM | POA: Diagnosis present

## 2023-05-28 DIAGNOSIS — I4719 Other supraventricular tachycardia: Secondary | ICD-10-CM | POA: Diagnosis present

## 2023-05-28 DIAGNOSIS — C61 Malignant neoplasm of prostate: Secondary | ICD-10-CM | POA: Diagnosis present

## 2023-05-28 DIAGNOSIS — Z1152 Encounter for screening for COVID-19: Secondary | ICD-10-CM

## 2023-05-28 DIAGNOSIS — A419 Sepsis, unspecified organism: Principal | ICD-10-CM

## 2023-05-28 DIAGNOSIS — D630 Anemia in neoplastic disease: Secondary | ICD-10-CM | POA: Diagnosis present

## 2023-05-28 DIAGNOSIS — Z818 Family history of other mental and behavioral disorders: Secondary | ICD-10-CM | POA: Diagnosis not present

## 2023-05-28 DIAGNOSIS — Z888 Allergy status to other drugs, medicaments and biological substances status: Secondary | ICD-10-CM | POA: Diagnosis not present

## 2023-05-28 DIAGNOSIS — I10 Essential (primary) hypertension: Secondary | ICD-10-CM | POA: Diagnosis present

## 2023-05-28 DIAGNOSIS — Z87440 Personal history of urinary (tract) infections: Secondary | ICD-10-CM

## 2023-05-28 DIAGNOSIS — E440 Moderate protein-calorie malnutrition: Secondary | ICD-10-CM | POA: Diagnosis present

## 2023-05-28 DIAGNOSIS — D696 Thrombocytopenia, unspecified: Secondary | ICD-10-CM | POA: Diagnosis present

## 2023-05-28 DIAGNOSIS — Z79899 Other long term (current) drug therapy: Secondary | ICD-10-CM

## 2023-05-28 DIAGNOSIS — C7951 Secondary malignant neoplasm of bone: Secondary | ICD-10-CM | POA: Diagnosis present

## 2023-05-28 DIAGNOSIS — J9811 Atelectasis: Secondary | ICD-10-CM | POA: Diagnosis not present

## 2023-05-28 DIAGNOSIS — E876 Hypokalemia: Secondary | ICD-10-CM | POA: Diagnosis present

## 2023-05-28 DIAGNOSIS — N390 Urinary tract infection, site not specified: Secondary | ICD-10-CM

## 2023-05-28 DIAGNOSIS — D649 Anemia, unspecified: Secondary | ICD-10-CM | POA: Diagnosis not present

## 2023-05-28 DIAGNOSIS — I499 Cardiac arrhythmia, unspecified: Secondary | ICD-10-CM | POA: Diagnosis not present

## 2023-05-28 DIAGNOSIS — Z803 Family history of malignant neoplasm of breast: Secondary | ICD-10-CM

## 2023-05-28 DIAGNOSIS — R197 Diarrhea, unspecified: Secondary | ICD-10-CM | POA: Diagnosis not present

## 2023-05-28 DIAGNOSIS — E119 Type 2 diabetes mellitus without complications: Secondary | ICD-10-CM | POA: Diagnosis present

## 2023-05-28 DIAGNOSIS — Z6823 Body mass index (BMI) 23.0-23.9, adult: Secondary | ICD-10-CM | POA: Diagnosis not present

## 2023-05-28 DIAGNOSIS — F1090 Alcohol use, unspecified, uncomplicated: Secondary | ICD-10-CM | POA: Diagnosis present

## 2023-05-28 DIAGNOSIS — E872 Acidosis, unspecified: Secondary | ICD-10-CM | POA: Diagnosis present

## 2023-05-28 DIAGNOSIS — Z823 Family history of stroke: Secondary | ICD-10-CM

## 2023-05-28 DIAGNOSIS — Z87891 Personal history of nicotine dependence: Secondary | ICD-10-CM

## 2023-05-28 DIAGNOSIS — Z8249 Family history of ischemic heart disease and other diseases of the circulatory system: Secondary | ICD-10-CM | POA: Diagnosis not present

## 2023-05-28 DIAGNOSIS — I252 Old myocardial infarction: Secondary | ICD-10-CM

## 2023-05-28 DIAGNOSIS — R0902 Hypoxemia: Secondary | ICD-10-CM | POA: Diagnosis not present

## 2023-05-28 DIAGNOSIS — R531 Weakness: Secondary | ICD-10-CM | POA: Diagnosis not present

## 2023-05-28 DIAGNOSIS — Z833 Family history of diabetes mellitus: Secondary | ICD-10-CM | POA: Diagnosis not present

## 2023-05-28 DIAGNOSIS — R000 Tachycardia, unspecified: Secondary | ICD-10-CM | POA: Diagnosis not present

## 2023-05-28 DIAGNOSIS — Z66 Do not resuscitate: Secondary | ICD-10-CM | POA: Diagnosis present

## 2023-05-28 DIAGNOSIS — R112 Nausea with vomiting, unspecified: Secondary | ICD-10-CM | POA: Diagnosis not present

## 2023-05-28 DIAGNOSIS — Z743 Need for continuous supervision: Secondary | ICD-10-CM | POA: Diagnosis not present

## 2023-05-28 DIAGNOSIS — G35 Multiple sclerosis: Secondary | ICD-10-CM | POA: Diagnosis not present

## 2023-05-28 LAB — CBC WITH DIFFERENTIAL/PLATELET
Abs Immature Granulocytes: 0.3 10*3/uL — ABNORMAL HIGH (ref 0.00–0.07)
Basophils Absolute: 0 10*3/uL (ref 0.0–0.1)
Basophils Relative: 0 %
Eosinophils Absolute: 0 10*3/uL (ref 0.0–0.5)
Eosinophils Relative: 0 %
HCT: 18.3 % — ABNORMAL LOW (ref 39.0–52.0)
Hemoglobin: 6 g/dL — ABNORMAL LOW (ref 13.0–17.0)
Immature Granulocytes: 3 %
Lymphocytes Relative: 15 %
Lymphs Abs: 1.8 10*3/uL (ref 0.7–4.0)
MCH: 33.7 pg (ref 26.0–34.0)
MCHC: 32.8 g/dL (ref 30.0–36.0)
MCV: 102.8 fL — ABNORMAL HIGH (ref 80.0–100.0)
Monocytes Absolute: 0.3 10*3/uL (ref 0.1–1.0)
Monocytes Relative: 2 %
Neutro Abs: 9.3 10*3/uL — ABNORMAL HIGH (ref 1.7–7.7)
Neutrophils Relative %: 80 %
Platelets: 104 10*3/uL — ABNORMAL LOW (ref 150–400)
RBC: 1.78 MIL/uL — ABNORMAL LOW (ref 4.22–5.81)
RDW: 18.8 % — ABNORMAL HIGH (ref 11.5–15.5)
WBC: 11.7 10*3/uL — ABNORMAL HIGH (ref 4.0–10.5)
nRBC: 0.3 % — ABNORMAL HIGH (ref 0.0–0.2)

## 2023-05-28 LAB — COMPREHENSIVE METABOLIC PANEL
ALT: 34 U/L (ref 0–44)
AST: 163 U/L — ABNORMAL HIGH (ref 15–41)
Albumin: 2 g/dL — ABNORMAL LOW (ref 3.5–5.0)
Alkaline Phosphatase: 337 U/L — ABNORMAL HIGH (ref 38–126)
Anion gap: 17 — ABNORMAL HIGH (ref 5–15)
BUN: 9 mg/dL (ref 8–23)
CO2: 19 mmol/L — ABNORMAL LOW (ref 22–32)
Calcium: 7.4 mg/dL — ABNORMAL LOW (ref 8.9–10.3)
Chloride: 100 mmol/L (ref 98–111)
Creatinine, Ser: 1.09 mg/dL (ref 0.61–1.24)
GFR, Estimated: >60 mL/min (ref >60)
Glucose, Bld: 105 mg/dL — ABNORMAL HIGH (ref 70–99)
Potassium: 3.3 mmol/L — ABNORMAL LOW (ref 3.5–5.1)
Sodium: 136 mmol/L (ref 135–145)
Total Bilirubin: 2 mg/dL — ABNORMAL HIGH (ref 0.3–1.2)
Total Protein: 4.8 g/dL — ABNORMAL LOW (ref 6.5–8.1)

## 2023-05-28 LAB — LACTIC ACID, PLASMA
Lactic Acid, Venous: >9 mmol/L (ref 0.5–1.9)
Lactic Acid, Venous: >9 mmol/L (ref 0.5–1.9)

## 2023-05-28 LAB — BPAM RBC
Blood Product Expiration Date: 202408092359
Blood Product Expiration Date: 202408092359
ISSUE DATE / TIME: 202407022319

## 2023-05-28 LAB — URINALYSIS, W/ REFLEX TO CULTURE (INFECTION SUSPECTED)
Glucose, UA: NEGATIVE mg/dL
Hgb urine dipstick: NEGATIVE
Ketones, ur: NEGATIVE mg/dL
Nitrite: NEGATIVE
Protein, ur: 30 mg/dL — AB
Specific Gravity, Urine: 1.02 (ref 1.005–1.030)
WBC, UA: >50 WBC/hpf (ref 0–5)
pH: 5 (ref 5.0–8.0)

## 2023-05-28 LAB — RESP PANEL BY RT-PCR (RSV, FLU A&B, COVID)  RVPGX2
Influenza A by PCR: NEGATIVE
Influenza B by PCR: NEGATIVE
Resp Syncytial Virus by PCR: NEGATIVE
SARS Coronavirus 2 by RT PCR: NEGATIVE

## 2023-05-28 LAB — PROTIME-INR
INR: 1.4 — ABNORMAL HIGH (ref 0.8–1.2)
Prothrombin Time: 17.5 seconds — ABNORMAL HIGH (ref 11.4–15.2)

## 2023-05-28 LAB — TYPE AND SCREEN

## 2023-05-28 LAB — PREPARE RBC (CROSSMATCH)

## 2023-05-28 LAB — APTT: aPTT: 25 seconds (ref 24–36)

## 2023-05-28 MED ORDER — FOLIC ACID 1 MG PO TABS
1.0000 mg | ORAL_TABLET | Freq: Every day | ORAL | Status: DC
  Administered 2023-05-29 – 2023-05-30 (×2): 1 mg via ORAL
  Filled 2023-05-28 (×2): qty 1

## 2023-05-28 MED ORDER — ONDANSETRON HCL 4 MG/2ML IJ SOLN
4.0000 mg | Freq: Four times a day (QID) | INTRAMUSCULAR | Status: DC | PRN
  Administered 2023-05-29: 4 mg via INTRAVENOUS
  Filled 2023-05-28 (×2): qty 2

## 2023-05-28 MED ORDER — SODIUM CHLORIDE 0.9 % IV BOLUS (SEPSIS)
1000.0000 mL | Freq: Once | INTRAVENOUS | Status: AC
  Administered 2023-05-28: 1000 mL via INTRAVENOUS

## 2023-05-28 MED ORDER — MORPHINE SULFATE (PF) 4 MG/ML IV SOLN
4.0000 mg | Freq: Once | INTRAVENOUS | Status: AC
  Administered 2023-05-28: 4 mg via INTRAVENOUS
  Filled 2023-05-28: qty 1

## 2023-05-28 MED ORDER — INSULIN ASPART 100 UNIT/ML IJ SOLN: 0.0000 [IU] | Freq: Three times a day (TID) | INTRAMUSCULAR | Status: DC

## 2023-05-28 MED ORDER — SODIUM CHLORIDE 0.9 % IV SOLN: 10.0000 mL/h | Freq: Once | INTRAVENOUS | Status: DC

## 2023-05-28 MED ORDER — ACETAMINOPHEN 650 MG RE SUPP: 650.0000 mg | Freq: Four times a day (QID) | RECTAL | Status: DC | PRN

## 2023-05-28 MED ORDER — SODIUM CHLORIDE 0.9 % IV BOLUS
1000.0000 mL | Freq: Once | INTRAVENOUS | Status: AC
  Administered 2023-05-29: 1000 mL via INTRAVENOUS

## 2023-05-28 MED ORDER — SODIUM CHLORIDE 0.9 % IV BOLUS (SEPSIS)
1250.0000 mL | Freq: Once | INTRAVENOUS | Status: AC
  Administered 2023-05-28: 1250 mL via INTRAVENOUS

## 2023-05-28 MED ORDER — SODIUM CHLORIDE 0.9 % IV SOLN: INTRAVENOUS | Status: DC

## 2023-05-28 MED ORDER — ACETAMINOPHEN 325 MG PO TABS: 650.0000 mg | ORAL_TABLET | Freq: Four times a day (QID) | ORAL | Status: DC | PRN

## 2023-05-28 MED ORDER — SODIUM CHLORIDE 0.9 % IV SOLN
2.0000 g | INTRAVENOUS | Status: DC
  Administered 2023-05-28 – 2023-05-29 (×2): 2 g via INTRAVENOUS
  Filled 2023-05-28 (×3): qty 20

## 2023-05-28 MED ORDER — OXYCODONE HCL 5 MG PO TABS
5.0000 mg | ORAL_TABLET | ORAL | Status: DC | PRN
  Administered 2023-05-29: 5 mg via ORAL
  Filled 2023-05-28: qty 1

## 2023-05-28 MED ORDER — FIDAXOMICIN 200 MG PO TABS
200.0000 mg | ORAL_TABLET | Freq: Two times a day (BID) | ORAL | Status: DC
  Administered 2023-05-29: 200 mg via ORAL
  Filled 2023-05-28 (×2): qty 1

## 2023-05-28 MED ORDER — LORAZEPAM 0.5 MG PO TABS: 0.5000 mg | ORAL_TABLET | ORAL | Status: DC | PRN

## 2023-05-28 MED ORDER — SODIUM CHLORIDE 0.9% FLUSH
3.0000 mL | Freq: Two times a day (BID) | INTRAVENOUS | Status: DC
  Administered 2023-05-29 – 2023-05-30 (×3): 3 mL via INTRAVENOUS

## 2023-05-28 MED ORDER — POTASSIUM CHLORIDE 20 MEQ PO PACK
40.0000 meq | PACK | Freq: Once | ORAL | Status: AC
  Administered 2023-05-29: 40 meq via ORAL
  Filled 2023-05-28: qty 2

## 2023-05-28 MED ORDER — ONDANSETRON HCL 4 MG PO TABS: 4.0000 mg | ORAL_TABLET | Freq: Four times a day (QID) | ORAL | Status: DC | PRN

## 2023-05-28 NOTE — ED Notes (Signed)
Verlon Au called from Hospice and provided phone number for any questions: [phone].

## 2023-05-28 NOTE — ED Notes (Signed)
Pt stuck 5 times by EMS per pt. Pt states he doesn't want to be stuck multiple times. Pt does not have a port. This RN attempted to start IV, blood returned but unable to flush IV. IV taken out. Pt informed of this RN putting in a IV consult. RN Whitney Post informed.

## 2023-05-28 NOTE — ED Provider Notes (Signed)
[facility] Provider Note    Event Date/Time   First MD Initiated Contact with Patient 05/28/23 1855     (approximate)   History   Chief Complaint: Emesis and Diarrhea   HPI  Roger [name] is a 67 y.o. male with a history of metastatic prostate cancer, hypertension, diabetes, anemia of chronic disease, prior episode of sepsis due to UTI who comes ED complaining of poor oral intake and generalized weakness worsening for the past week.  Denies any specific pain.  No shortness of breath or fever.  Verlon Au called from patient's hospice and provided the following phone number: [phone].     Physical Exam   Triage Vital Signs: ED Triage Vitals  Enc Vitals Group     BP 05/28/23 1856 106/81     Pulse --      Resp 05/28/23 1856 (!) 28     Temp 05/28/23 1856 98.5 F (36.9 C)     Temp Source 05/28/23 1856 Oral     SpO2 05/28/23 1856 100 %     Weight 05/28/23 1858 160 lb (72.6 kg)     Height 05/28/23 1858 5\' 9"  (1.753 m)     Head Circumference --      Peak Flow --      Pain Score 05/28/23 1857 0     Pain Loc --      Pain Edu? --      Excl. in GC? --     Most recent vital signs: Vitals:   05/28/23 2000 05/28/23 2130  BP: 125/88 121/89  Pulse: (!) 111 (!) 108  Resp: 17 (!) 26  Temp:    SpO2: 100% 100%    General: Awake, no distress.  CV:  Good peripheral perfusion.  Tachycardia heart rate 120 Resp:  Normal effort.  Clear to auscultation bilaterally Abd:  No distention.  Soft with suprapubic fullness and tenderness . Other:  dry mucous membranes   ED Results / Procedures / Treatments   Labs (all labs ordered are listed, but only abnormal results are displayed) Labs Reviewed  LACTIC ACID, PLASMA - Abnormal; Notable for the following components:      Result Value   Lactic Acid, Venous >9.0 (*)    All other components within normal limits  COMPREHENSIVE METABOLIC PANEL - Abnormal; Notable for the following components:    Potassium 3.3 (*)    CO2 19 (*)    Glucose, Bld 105 (*)    Calcium 7.4 (*)    Total Protein 4.8 (*)    Albumin 2.0 (*)    AST 163 (*)    Alkaline Phosphatase 337 (*)    Total Bilirubin 2.0 (*)    Anion gap 17 (*)    All other components within normal limits  CBC WITH DIFFERENTIAL/PLATELET - Abnormal; Notable for the following components:   WBC 11.7 (*)    RBC 1.78 (*)    Hemoglobin 6.0 (*)    HCT 18.3 (*)    MCV 102.8 (*)    RDW 18.8 (*)    Platelets 104 (*)    nRBC 0.3 (*)    Neutro Abs 9.3 (*)    Abs Immature Granulocytes 0.30 (*)    All other components within normal limits  PROTIME-INR - Abnormal; Notable for the following components:   Prothrombin Time 17.5 (*)    INR 1.4 (*)    All other components within normal limits  URINALYSIS, W/ REFLEX TO CULTURE (INFECTION SUSPECTED) - Abnormal;  Notable for the following components:   Color, Urine AMBER (*)    APPearance HAZY (*)    Bilirubin Urine SMALL (*)    Protein, ur 30 (*)    Leukocytes,Ua SMALL (*)    Bacteria, UA MANY (*)    All other components within normal limits  RESP PANEL BY RT-PCR (RSV, FLU A&B, COVID)  RVPGX2  CULTURE, BLOOD (ROUTINE X 2)  CULTURE, BLOOD (ROUTINE X 2)  URINE CULTURE  APTT  LACTIC ACID, PLASMA  PREPARE RBC (CROSSMATCH)  TYPE AND SCREEN     EKG Interpreted by me Sinus tachycardia rate 122.  Right axis, poor R wave progression.  Normal ST segments and T waves.   RADIOLOGY Chest x-ray interpreted by me, appears normal.  Radiology report reviewed   PROCEDURES:  .Critical Care  Performed by: Sharman Cheek, MD Authorized by: Sharman Cheek, MD   Critical care provider statement:    Critical care time (minutes):  35   Critical care time was exclusive of:  Separately billable procedures and treating other patients   Critical care was necessary to treat or prevent imminent or life-threatening deterioration of the following conditions:  Sepsis and shock   Critical care was  time spent personally by me on the following activities:  Development of treatment plan with patient or surrogate, discussions with consultants, evaluation of patient's response to treatment, examination of patient, obtaining history from patient or surrogate, ordering and performing treatments and interventions, ordering and review of laboratory studies, ordering and review of radiographic studies, pulse oximetry, re-evaluation of patient's condition and review of old charts   Care discussed with: admitting provider   Comments:        .1-3 Lead EKG Interpretation  Performed by: Sharman Cheek, MD Authorized by: Sharman Cheek, MD     Interpretation: abnormal     ECG rate:  120   ECG rate assessment: tachycardic     Rhythm: sinus tachycardia     Ectopy: none     Conduction: normal      MEDICATIONS ORDERED IN ED: Medications  cefTRIAXone (ROCEPHIN) 2 g in sodium chloride 0.9 % 100 mL IVPB (0 g Intravenous Stopped 05/28/23 2036)  0.9 %  sodium chloride infusion (0 mL/hr Intravenous Hold 05/28/23 2059)  sodium chloride 0.9 % bolus 1,000 mL (1,000 mLs Intravenous New Bag/Given 05/28/23 1945)  sodium chloride 0.9 % bolus 1,250 mL (1,250 mLs Intravenous New Bag/Given 05/28/23 2037)  morphine (PF) 4 MG/ML injection 4 mg (4 mg Intravenous Given 05/28/23 2052)     IMPRESSION / MDM / ASSESSMENT AND PLAN / ED COURSE  I reviewed the triage vital signs and the nursing notes.  DDx: Urinary retention, UTI, sepsis, dehydration, AKI, electrolyte abnormality, anemia, COVID, flu, pneumonia, doubt pulmonary embolism/ACS/dissection  Patient's presentation is most consistent with acute presentation with potential threat to life or bodily function.  Patient presents with generalized weakness, tachypnea, tachycardia.  Oxygenation is normal, blood pressure and temperature are normal.  Exam suggestive of dehydration and cystitis.  Labs showed lactic acidosis with elevated anion gap.  Hemoglobin of 6.   Discussed care with patient, he is agreeable with continuing IV fluids for hydration, red blood cell transfusion, IV antibiotics and admission to the hospital.  ----------------------------------------- 9:37 PM on 05/28/2023 ----------------------------------------- UA consistent with cystitis.  Case discussed with hospitalist.       FINAL CLINICAL IMPRESSION(S) / ED DIAGNOSES   Final diagnoses:  Septic shock (HCC)  Symptomatic anemia  Prostate cancer metastatic to bone (HCC)  Rx / DC Orders   ED Discharge Orders     None        Note:  This document was prepared using Dragon voice recognition software and may include unintentional dictation errors.   Sharman Cheek, MD 05/28/23 2137

## 2023-05-28 NOTE — Telephone Encounter (Signed)
Copied from CRM [id number]. Topic: Transportation - Transportation >> May 28, 2023  9:00 AM [place] B wrote: Reason for CRM: pt's daughter called in, pt needs transportaion due to him not being able to walk well.  This patient doesn't have an appt set up yet however, will need to have transportation.  Can you help [place] with this?

## 2023-05-28 NOTE — ED Triage Notes (Signed)
Pt to ED via [facility] from home with complaints of N/V/D x1 week. Pt states that symptoms have been worsening over the past 4 days. Pt states they started having chills 4 days ago. Pt has hx of bone cancer and diabetes.   EMS vitals HR 125 Temp 98.4  RR 27  BP 130/80  CBG 154

## 2023-05-28 NOTE — Telephone Encounter (Signed)
Summary: diarrhea and vomiting   Pt's daughter called in , pt has diarrhea and vomiting for 2 weeks           Chief Complaint: vomiting and diarrhea Symptoms: vomiting thick clear mucus , diarrhea, loose and watery incontinent at times. No abdominal pain . Unable to tolerate liquids, breaks out into sweats with vomiting  Frequency: 2 weeks  Pertinent Negatives: Patient denies dizziness, no abdominal pain , no black stools or dark emesis Disposition: [] ED /[] Urgent Care (no appt availability in office) / [] Appointment(In office/virtual)/ []  [place] [facility]/ [] Home Care/ [x] Refused Recommended Disposition /[] [place] [facility]/ []  Follow-up with PCP Additional Notes:   Daughter on phone with patient and NT recommended go to ED for IV hydration. Unable to tolerate liquids. Daughter declined and would like PCP notified for further instructions. Please advise.  [facility] Okey Regal notified , daughter declined ED until PCP notified.      Reason for Disposition  [1] Drinking very little AND [2] dehydration suspected (e.g., no urine > 12 hours, very dry mouth, very lightheaded)  Answer Assessment - Initial Assessment Questions 1. DIARRHEA SEVERITY: "How bad is the diarrhea?" "How many more stools have you had in the past 24 hours than normal?"    - NO DIARRHEA (SCALE 0)   - MILD (SCALE 1-3): Few loose or mushy BMs; increase of 1-3 stools over normal daily number of stools; mild increase in ostomy output.   -  MODERATE (SCALE 4-7): Increase of 4-6 stools daily over normal; moderate increase in ostomy output.   -  SEVERE (SCALE 8-10; OR "WORST POSSIBLE"): Increase of 7 or more stools daily over normal; moderate increase in ostomy output; incontinence.     Multiple stools and incontinent at times. Vomiting thick clear mucus 2. ONSET: "When did the diarrhea begin?"      2 weeks ago  3. BM CONSISTENCY: "How loose or watery is the diarrhea?"      Loose and watery  4. VOMITING: "Are you also  vomiting?" If Yes, ask: "How many times in the past 24 hours?"      Yes did not report how many times 5. ABDOMEN PAIN: "Are you having any abdomen pain?" If Yes, ask: "What does it feel like?" (e.g., crampy, dull, intermittent, constant)      No  6. ABDOMEN PAIN SEVERITY: If present, ask: "How bad is the pain?"  (e.g., Scale 1-10; mild, moderate, or severe)   - MILD (1-3): doesn't interfere with normal activities, abdomen soft and not tender to touch    - MODERATE (4-7): interferes with normal activities or awakens from sleep, abdomen tender to touch    - SEVERE (8-10): excruciating pain, doubled over, unable to do any normal activities       Denies  7. ORAL INTAKE: If vomiting, "Have you been able to drink liquids?" "How much liquids have you had in the past 24 hours?"     Not tolerating water, no gatorade no liquids staying down 8. HYDRATION: "Any signs of dehydration?" (e.g., dry mouth [not just dry lips], too weak to stand, dizziness, new weight loss) "When did you last urinate?"    Denies  9. EXPOSURE: "Have you traveled to a foreign country recently?" "Have you been exposed to anyone with diarrhea?" "Could you have eaten any food that was spoiled?"     na 10. ANTIBIOTIC USE: "Are you taking antibiotics now or have you taken antibiotics in the past 2 months?"  na 11. OTHER SYMPTOMS: "Do you have any other symptoms?" (e.g., fever, blood in stool)       Vomiting clear thick mucus,diarrhea incontinent watery and loose  12. PREGNANCY: "Is there any chance you are pregnant?" "When was your last menstrual period?"       na  Protocols used: [facility]

## 2023-05-28 NOTE — Progress Notes (Signed)
CODE SEPSIS - PHARMACY COMMUNICATION  **Broad Spectrum Antibiotics should be administered within 1 hour of Sepsis diagnosis**  Time Code Sepsis Called/Page Received: 1906  Antibiotics Ordered: ceftriaxone   Time of 1st antibiotic administration: 1946   Gardner Candle, PharmD, BCPS Clinical Pharmacist 05/28/2023 7:17 PM

## 2023-05-28 NOTE — ED Notes (Signed)
Lab called requesting type and screen to be drawn.

## 2023-05-28 NOTE — Sepsis Progress Note (Signed)
Following for sepsis monitoring ?

## 2023-05-28 NOTE — H&P (Signed)
History and Physical    Roger [name] ZOX:[id number] DOB: 1956/10/03 DOA: 05/28/2023  PCP: Alfredia Ferguson, PA-C  Patient coming from: home  I have personally briefly reviewed patient's old medical records in [facility]  Chief Complaint:  n/v/d x 1 week  HPI: Roger [name] is a 67 y.o. male with medical history significant of  metastatic prostate cancer on hospice, hypertension, diabetes, anemia of chronic disease, morbid obesity, prostate cancer, thrombocytopenia , recurrent UTI who present to ED with generalized weakness,  n/v/d, poor oral intake x weeks but worse over the last few days.  He notes no fever / no chills / no cough no sob / no blood in stools or emesis. He denies chest pain , he does however endorse weight loss.  ED Course:  On evaluation in ED patient noted to have hgb ot 6 down from 8.2 as well UTI with + code sepsis . Patient is admitted for further care. Of note patient currently in hospice, per EDP patient desires ivfs as well as antibiotics.  Afeb, 98.5, BP 106/81, HR 125, rr 28  sat 100% on ra  EKG junctional tachycardia RAD, prolonged QT 513 Wbc 11.7, hgb 6(8.2) mcv 102.8, rdw 18.8, plt 104,  pmn 9.3 Inr 1.4 Na 136,K 3.3 biacarb 19, cl 105, cr 1.09,  albumin 2, ast 163, Alphos 337  t bili 2  AG 17 UA + ? 50 wbc  Resp panel neg Lactic >9 TX NS 1L  CTX 2g [id number] morphine Review of Systems: As per HPI otherwise 10 point review of systems negative.   Past Medical History:  Diagnosis Date   Chest pain    a. 04/2016 MV: small, mild apical defect c/w apical ischemia, EF 55-65%-->Low risk study.   Diabetes mellitus without complication (HCC)    Gout    Hematochezia    History of echocardiogram    a. 04/2016 Echo: EF 55-60%, mild LVH, no rwma, midly dil LA.   History of heart attack    Hypertension    Morbid obesity (HCC)    Prostate cancer (HCC)    Thrombocytopenia (HCC) 09/28/2022    Past Surgical History:  Procedure Laterality Date    COLONOSCOPY WITH PROPOFOL N/A 08/05/2018   Procedure: COLONOSCOPY WITH PROPOFOL;  Surgeon: Toney Reil, MD;  Location: [facility] ENDOSCOPY;  Service: Gastroenterology;  Laterality: N/A;   thumb surgery       reports that he quit smoking about 30 years ago. His smoking use included cigarettes. He has a 6.25 pack-year smoking history. He has been exposed to tobacco smoke. He has never used smokeless tobacco. He reports current alcohol use of about 42.0 standard drinks of alcohol per week. He reports that he does not use drugs.  Allergies  Allergen Reactions   Albuterol Nausea And Vomiting   Mucinex [Guaifenesin Er] Rash    Family History  Problem Relation Age of Onset   Heart Problems Mother    Breast cancer Mother    Mental illness Father    Heart disease Father    Stroke Father    Diabetes Father    Alcohol abuse Father    Bipolar disorder Father    Heart disease Other    Hypertension Other    Diabetes Other     Prior to Admission medications   Medication Sig Start Date End Date Taking? Authorizing Provider  folic acid (FOLVITE) 1 MG tablet Take 1 tablet (1 mg total) by mouth daily. 04/24/23  Yes Rickard Patience,  MD  loperamide (IMODIUM) 2 MG capsule Take 2 mg by mouth as needed. 03/06/23  Yes [provider]  LORazepam (ATIVAN) 0.5 MG tablet Take 0.5 mg by mouth every 4 (four) hours as needed. 03/04/23  Yes [provider]  ondansetron (ZOFRAN-ODT) 4 MG disintegrating tablet Take 4 mg by mouth every 6 (six) hours as needed. 05/04/23  Yes [provider]  oxyCODONE (OXY IR/ROXICODONE) 5 MG immediate release tablet Take 5 mg by mouth every 4 (four) hours as needed. 02/19/23  Yes [provider]  nitroGLYCERIN (NITROSTAT) 0.4 MG SL tablet Place 0.4 mg under the tongue every 5 (five) minutes as needed. Patient not taking: Reported on 04/24/2023 03/03/23   [provider]    Physical Exam: Vitals:   05/28/23 1858 05/28/23 1900 05/28/23 2000 05/28/23  2130  BP:   125/88 121/89  Pulse:  (!) 118 (!) 111 (!) 108  Resp:   17 (!) 26  Temp:      TempSrc:      SpO2:   100% 100%  Weight: 72.6 kg     Height: 5\' 9"  (1.753 m)       Constitutional: NAD, calm, comfortable Vitals:   05/28/23 1858 05/28/23 1900 05/28/23 2000 05/28/23 2130  BP:   125/88 121/89  Pulse:  (!) 118 (!) 111 (!) 108  Resp:   17 (!) 26  Temp:      TempSrc:      SpO2:   100% 100%  Weight: 72.6 kg     Height: 5\' 9"  (1.753 m)      Eyes: PERRL, lids and conjunctivae normal ENMT: Mucous membranes are moist. Posterior pharynx clear of any exudate or lesions.Normal dentition.  Neck: normal, supple, no masses, no thyromegaly Respiratory: clear to auscultation bilaterally, no wheezing, no crackles. Normal respiratory effort. No accessory muscle use.  Cardiovascular: Regular rate and rhythm, no murmurs / rubs / gallops. No extremity edema. 2+ pedal pulses. No carotid bruits.  Abdomen: no tenderness, no masses palpated. No hepatosplenomegaly. Bowel sounds positive.  Musculoskeletal: no clubbing / cyanosis. No joint deformity upper and lower extremities. Good ROM, no contractures. Normal muscle tone.  Skin: no rashes, lesions, ulcers. No induration Neurologic: CN 2-12 grossly intact. Sensation intact, DTR normal. Strength 5/5 in all 4.  Psychiatric: Normal judgment and insight. Alert and oriented x 3. Normal mood.    Labs on Admission: I have personally reviewed following labs and imaging studies  CBC: Recent Labs  Lab 05/28/23 1900  WBC 11.7*  NEUTROABS 9.3*  HGB 6.0*  HCT 18.3*  MCV 102.8*  PLT 104*   Basic Metabolic Panel: Recent Labs  Lab 05/28/23 1900  NA 136  K 3.3*  CL 100  CO2 19*  GLUCOSE 105*  BUN 9  CREATININE 1.09  CALCIUM 7.4*   GFR: Estimated Creatinine Clearance: 67.6 mL/min (by C-G formula based on SCr of 1.09 mg/dL). Liver Function Tests: Recent Labs  Lab 05/28/23 1900  AST 163*  ALT 34  ALKPHOS 337*  BILITOT 2.0*  PROT 4.8*   ALBUMIN 2.0*   No results for input(s): "LIPASE", "AMYLASE" in the last 168 hours. No results for input(s): "AMMONIA" in the last 168 hours. Coagulation Profile: Recent Labs  Lab 05/28/23 1900  INR 1.4*   Cardiac Enzymes: No results for input(s): "CKTOTAL", "CKMB", "CKMBINDEX", "TROPONINI" in the last 168 hours. BNP (last 3 results) No results for input(s): "PROBNP" in the last 8760 hours. HbA1C: No results for input(s): "HGBA1C" in the last  72 hours. CBG: No results for input(s): "GLUCAP" in the last 168 hours. Lipid Profile: No results for input(s): "CHOL", "HDL", "LDLCALC", "TRIG", "CHOLHDL", "LDLDIRECT" in the last 72 hours. Thyroid Function Tests: No results for input(s): "TSH", "T4TOTAL", "FREET4", "T3FREE", "THYROIDAB" in the last 72 hours. Anemia Panel: No results for input(s): "VITAMINB12", "FOLATE", "FERRITIN", "TIBC", "IRON", "RETICCTPCT" in the last 72 hours. Urine analysis:    Component Value Date/Time   COLORURINE AMBER (A) 05/28/2023 1901   APPEARANCEUR HAZY (A) 05/28/2023 1901   LABSPEC 1.020 05/28/2023 1901   PHURINE 5.0 05/28/2023 1901   GLUCOSEU NEGATIVE 05/28/2023 1901   HGBUR NEGATIVE 05/28/2023 1901   BILIRUBINUR SMALL (A) 05/28/2023 1901   KETONESUR NEGATIVE 05/28/2023 1901   PROTEINUR 30 (A) 05/28/2023 1901   NITRITE NEGATIVE 05/28/2023 1901   LEUKOCYTESUR SMALL (A) 05/28/2023 1901    Radiological Exams on Admission: DG Chest Port 1 View  Result Date: 05/28/2023 CLINICAL DATA:  Possible sepsis, initial encounter EXAM: PORTABLE CHEST 1 VIEW COMPARISON:  02/07/2023 FINDINGS: Cardiac shadow is mildly prominent but stable. Lungs are well aerated bilaterally. Very minimal left basilar atelectasis is noted new from the previous exam. Multiple sclerotic foci are noted throughout the bony skeleton consistent with metastatic disease. IMPRESSION: Mild left basilar atelectasis. Changes consistent with prior metastatic prostate carcinoma Electronically Signed    By: Alcide Clever M.D.   On: 05/28/2023 19:34    EKG: Independently reviewed.   Assessment/Plan  UTI with associated Sepsis  - admit to progressive care  - continue on CTX  -f/u on culture data   Symptomatic anemia of chronic disease  - Macrocytic anemia  - transfuse 2 unit prbc -patient and significant other has agreed to blood transfusion    Hypokalemia  -replete  -check mag level   Hx of C- dif  - patient with complaints of loose stool and abdominal pain  - will start fidaxomicin in setting of sepsis and recent infection tx with po vanc  - f/u on stools studies  -de-escalate abx as able   Metastatic prostate cancer - patient currently on home hospice  -Verlon Au patient hospice contact phone number: [phone] - DNR   Essential hypertension   Diabetes mellitus type II - ADA diet  - finger sticks  -ISS  Hx of ETOH use  -CIWA protocol   DVT prophylaxis: scd Code Status: DNR as discussed per patient wishes in event of cardiac arrest  Family Communication: none at bedside, but case discussed with Bronson Ing per patient wishes a friend Disposition Plan: patient  expected to be admitted greater than 2 midnights  Consults called: n/a Admission status: progressive care   Lurline Del MD [facility]  If 7PM-7AM, please contact night-coverage [email or web address] Password [facility]  05/28/2023, 9:58 PM

## 2023-05-28 NOTE — ED Notes (Signed)
Lab called and phlebotomist requested to draw second set of blood cultures.

## 2023-05-28 NOTE — Telephone Encounter (Signed)
Patient advised. Verbalized understanding

## 2023-05-28 NOTE — ED Notes (Signed)
Patient reported pain to left arm. IV noted to be infiltrated with swelling to left upper arm. IV fluids stopped and PIV discontinued. Arm elevated on two pillows.

## 2023-05-29 ENCOUNTER — Other Ambulatory Visit (HOSPITAL_COMMUNITY): Payer: Self-pay

## 2023-05-29 ENCOUNTER — Encounter: Payer: Self-pay | Admitting: Internal Medicine

## 2023-05-29 LAB — GLUCOSE, CAPILLARY
Glucose-Capillary: 78 mg/dL (ref 70–99)
Glucose-Capillary: 75 mg/dL (ref 70–99)
Glucose-Capillary: 82 mg/dL (ref 70–99)
Glucose-Capillary: 82 mg/dL (ref 70–99)

## 2023-05-29 LAB — CBC
HCT: 28.3 % — ABNORMAL LOW (ref 39.0–52.0)
Hemoglobin: 9.1 g/dL — ABNORMAL LOW (ref 13.0–17.0)
MCH: 30.8 pg (ref 26.0–34.0)
MCHC: 32.2 g/dL (ref 30.0–36.0)
MCV: 95.9 fL (ref 80.0–100.0)
Platelets: 75 10*3/uL — ABNORMAL LOW (ref 150–400)
RBC: 2.95 MIL/uL — ABNORMAL LOW (ref 4.22–5.81)
RDW: 18.5 % — ABNORMAL HIGH (ref 11.5–15.5)
WBC: 10.2 10*3/uL (ref 4.0–10.5)
nRBC: 0 % (ref 0.0–0.2)

## 2023-05-29 LAB — COMPREHENSIVE METABOLIC PANEL
ALT: 30 U/L (ref 0–44)
AST: 118 U/L — ABNORMAL HIGH (ref 15–41)
Albumin: 1.7 g/dL — ABNORMAL LOW (ref 3.5–5.0)
Alkaline Phosphatase: 267 U/L — ABNORMAL HIGH (ref 38–126)
Anion gap: 17 — ABNORMAL HIGH (ref 5–15)
BUN: 10 mg/dL (ref 8–23)
CO2: 19 mmol/L — ABNORMAL LOW (ref 22–32)
Calcium: 7 mg/dL — ABNORMAL LOW (ref 8.9–10.3)
Chloride: 104 mmol/L (ref 98–111)
Creatinine, Ser: 0.96 mg/dL (ref 0.61–1.24)
GFR, Estimated: >60 mL/min (ref >60)
Glucose, Bld: 86 mg/dL (ref 70–99)
Potassium: 3.9 mmol/L (ref 3.5–5.1)
Sodium: 140 mmol/L (ref 135–145)
Total Bilirubin: 1.1 mg/dL (ref 0.3–1.2)
Total Protein: 4.3 g/dL — ABNORMAL LOW (ref 6.5–8.1)

## 2023-05-29 LAB — CBG MONITORING, ED: Glucose-Capillary: 80 mg/dL (ref 70–99)

## 2023-05-29 LAB — BPAM RBC

## 2023-05-29 LAB — TYPE AND SCREEN

## 2023-05-29 LAB — LACTIC ACID, PLASMA
Lactic Acid, Venous: 7.7 mmol/L (ref 0.5–1.9)
Lactic Acid, Venous: 6.1 mmol/L (ref 0.5–1.9)

## 2023-05-29 LAB — CULTURE, BLOOD (ROUTINE X 2)

## 2023-05-29 MED ORDER — SODIUM CHLORIDE 0.9 % IV SOLN: INTRAVENOUS | Status: DC

## 2023-05-29 MED ORDER — ADULT MULTIVITAMIN W/MINERALS CH
1.0000 | ORAL_TABLET | Freq: Every day | ORAL | Status: DC
  Administered 2023-05-30: 1 via ORAL
  Filled 2023-05-29 (×2): qty 1

## 2023-05-29 MED ORDER — BOOST / RESOURCE BREEZE PO LIQD CUSTOM
1.0000 | Freq: Three times a day (TID) | ORAL | Status: DC
  Administered 2023-05-29 – 2023-05-30 (×3): 1 via ORAL

## 2023-05-29 NOTE — Progress Notes (Signed)
Initial Nutrition Assessment  DOCUMENTATION CODES:   Non-severe (moderate) malnutrition in context of chronic illness  INTERVENTION:   -Boost Breeze po TID, each supplement provides 250 kcal and 9 grams of protein  -MVI with minerals daily -Liberalize diet to regular for widest variety of meal selections -Feeding assistance with meals  NUTRITION DIAGNOSIS:   Moderate Malnutrition related to chronic illness (metastatic prostate cancer) as evidenced by mild fat depletion, moderate fat depletion, mild muscle depletion, moderate muscle depletion.  GOAL:   Patient will meet greater than or equal to 90% of their needs  MONITOR:   PO intake, Supplement acceptance, Skin  REASON FOR ASSESSMENT:   Malnutrition Screening Tool    ASSESSMENT:   Pt with medical history significant of   metastatic prostate cancer on hospice, hypertension, diabetes, anemia of chronic disease, morbid obesity, prostate cancer, thrombocytopenia , recurrent UTI who presents with generalized weakness,  n/v/d, poor oral intake x weeks but worse over the last few days  Pt admitted with UTI, sepsis, and anemia.   Reviewed I/O's: +4 L x 24 hours   Spoke with pt at bedside, who was sleeping, but arouse to voice. Pt reports feeling a little better today. Lunch tray on tray table untouched, but pt reports he consumed a few bites of breakfast this morning, which he was able to keep down. Pt shares that he has been having difficulty keeping foods and liquids down over the past few days. Pt shares that his appetite is poor at baseline, but has worsened. He shares that staff have been helping feed him.   Pt unsure of UBW, but reports he continues to lose weight. Reviewed wt hx; pt has experienced a 4.7% wt loss over the past 6 months.   Discussed importance of good meal and supplement intake to promote healing. Pt amenable to Ensure supplements, but wary of diarrhea. He is amenable to [employer].   Medications reviewed  and include folic acid and  0.9% sodium chloride infusion @ 75 ml/hr.   Lab Results  Component Value Date   HGBA1C 4.4 (L) 04/05/2023   PTA DM medications are none.   Labs reviewed: CBGS: 75-78 (inpatient orders for glycemic control are 0-6 units insulin aspart TID with meals ).    NUTRITION - FOCUSED PHYSICAL EXAM:  Flowsheet Row Most Recent Value  Orbital Region Moderate depletion  Upper Arm Region Moderate depletion  Buccal Region Moderate depletion  Temple Region Moderate depletion  Clavicle Bone Region Moderate depletion  Clavicle and Acromion Bone Region Moderate depletion  Scapular Bone Region Moderate depletion  Dorsal Hand Moderate depletion  Patellar Region Moderate depletion  Anterior Thigh Region Moderate depletion  Posterior Calf Region Moderate depletion  Edema (RD Assessment) None  Hair Reviewed  Eyes Reviewed  Mouth Reviewed  Skin Reviewed  Nails Reviewed       Diet Order:   Diet Order             Diet heart healthy/carb modified Room service appropriate? Yes; Fluid consistency: Thin  Diet effective now                   EDUCATION NEEDS:   Education needs have been addressed  Skin:  Skin Assessment: Reviewed RN Assessment  Last BM:  05/28/23 (type 5)  Height:   Ht Readings from Last 1 Encounters:  05/28/23 5\' 9"  (1.753 m)    Weight:   Wt Readings from Last 1 Encounters:  05/28/23 72.6 kg    Ideal Body  Weight:  72.7 kg  BMI:  Body mass index is 23.63 kg/m.  Estimated Nutritional Needs:   Kcal:  2100-2300  Protein:  105-120 grams  Fluid:  > 2 L    Levada Schilling, RD, LDN, CDCES Registered Dietitian II Certified Diabetes Care and Education Specialist Please refer to [facility] for RD and/or RD on-call/weekend/after hours pager

## 2023-05-29 NOTE — Patient Instructions (Signed)
Visit Information  Thank you for taking time to visit with me today. Please don't hesitate to contact me if I can be of assistance to you.   Following are the goals we discussed today:   Goals Addressed             This Visit's Progress    "I need transportation to my doctor's appointments"       Activities and task to complete in order to accomplish goals.   Patient approved for [facility] [phone] patient agreeable to call to schedule rides to medical appointments          If you are experiencing a Mental Health or Behavioral Health Crisis or need someone to talk to, please call 911   Patient verbalizes understanding of instructions and care plan provided today and agrees to view in MyChart. Active MyChart status and patient understanding of how to access instructions and care plan via MyChart confirmed with patient.     No further follow up required: patient to call this Child psychotherapist with any additional community resource needs  [employer], LCSW Clinical Social Worker  [facility] Care Management [phone]

## 2023-05-29 NOTE — Progress Notes (Signed)
[facility]) Hospital Liaison Note  Aedric Marinoff is a current [facility] patient with a terminal diagnosis of malignant neoplasm of the prostate. Patient/family activated EMS as he had nausea, vomiting and increased weakness throughout the day. Patient was admitted to [facility] 7.2.2024 for UTI with associated sepsis and symptomatic anemia.  Patient is GIP appropriate for treatment of infection with IV antibiotics, blood transfusion, electrolyte replacement and continued monitoring.  VS: 97.3 oral ,160/106, 79, 16, 100% on room air  I/O:4020/0  Abnormal Labs: Sodium: 136 Potassium: 3.3 (L) CO2: 19 (L) Glucose: 105 (H) Calcium: 7.4 (L) Anion gap: 17 (H) Alkaline Phosphatase: 337 (H) Albumin: 2.0 (L) AST: 163 (H) Total Protein: 4.8 (L) Total Bilirubin: 2.0 (H) WBC: 11.7 (H) RBC: 1.78 (L) Hemoglobin: 6.0 (L) HCT: 18.3 (L) MCV: 102.8 (H) RDW: 18.8 (H) Platelets: 104 (L) nRBC: 0.3 (H) NEUT#: 9.3 (H) Abs Immature Granulocytes: 0.30 (H) Prothrombin Time: 17.5 (H) INR: 1.4 (H) Lactic Acid: >9.0  Diagnostics:  Chest Xray 7.2.2024 IMPRESSION: Mild left basilar atelectasis.   Changes consistent with prior metastatic prostate carcinoma  IV/PRN Medications: Morphine 4mg  IV X 1, NS  1000 cc bolus now @ 75 cc/hr, Rocephin 2 G IV Q 24 hours X 1, ondansetron 4mg  po X 1, oxycodone 5mg  IR po X 1  Assessment/Plan   UTI with associated Sepsis  - admit to progressive care  - continue on CTX  -f/u on culture data    Symptomatic anemia of chronic disease  - Macrocytic anemia  - transfuse 2 unit prbc -patient and significant other has agreed to blood transfusion      Hypokalemia  -replete  -check mag level    Hx of C- dif  - patient with complaints of loose stool and abdominal pain  - will start fidaxomicin in setting of sepsis and recent infection tx with po vanc  - f/u on stools studies  -de-escalate abx as able    Metastatic prostate cancer -  patient currently on home hospice  -[facility] patient hospice contact phone number: [phone] - DNR    Essential hypertension    Diabetes mellitus type II - ADA diet  - finger sticks  -ISS   Hx of ETOH use  -CIWA protocol   Discharge Planning: Return home with hospice when medically optimized  Family Contact: Redge Gainer, [facility] hospital liaison, visited with patient at bedside.  IDT: updated  Goals of Care: DNR, treat the treatable  Medication list and transfer summary placed on patients chart.  Please call with any hospice questions or concerns.  Thank you, Haynes Bast, BSN, RN [facility] Liaison [phone]

## 2023-05-29 NOTE — ED Notes (Signed)
CBG was 80, will notify RN, Dorene Grebe

## 2023-05-29 NOTE — Patient Outreach (Signed)
  Care Coordination   Follow Up Visit Note   05/29/2023 Late Entry Name: Roger [name] MRN: [id number] DOB: 01-13-1956  Roger [name] is a 67 y.o. year old male who sees Ok Edwards, Lillia Abed, [place] for primary care. I spoke with  Nena Alexander by phone on 05/28/23  What matters to the patients health and wellness today?  Transportation to medical appointments    Goals Addressed             This Visit's Progress    "I need transportation to my doctor's appointments"       Activities and task to complete in order to accomplish goals.   Patient approved for [facility] [phone] patient agreeable to call to schedule rides to medical appointments          SDOH assessments and interventions completed:  No     Care Coordination Interventions:  Yes, provided   Follow up plan: No further intervention required.   Encounter Outcome:  Pt. Visit Completed

## 2023-05-29 NOTE — Progress Notes (Signed)
  PROGRESS NOTE    Roger [name]  NGE:[id number] DOB: [birth year] DOA: 05/28/2023 PCP: Alfredia Ferguson, PA-C  128A/128A-AA  LOS: 1 day   Brief hospital course:   Assessment & Plan: Roger [name] is a 67 y.o. male with medical history significant of  metastatic prostate cancer on hospice, hypertension, diabetes, anemia of chronic disease, morbid obesity, prostate cancer, thrombocytopenia , recurrent UTI who present to ED with generalized weakness,  n/v/d, poor oral intake x weeks but worse over the last few days.  He notes no fever / no chills / no cough no sob / no blood in stools or emesis. He denies chest pain , he does however endorse weight loss.   UTI with associated Sepsis  --pt denied urinary symptom today --cont ceftriaxone for now, pending urine cx   Symptomatic anemia of chronic disease  S/p 2u pRBC --monitor and transfuse to keep Hgb >7  Lactic acidosis --lactic acid severely elevated --cont MIVF --trend   Hypokalemia  --monitor and replete PRN   N/V and Diarrhea --resolved after presentation --d/c fidaxomicin  --cont MIVF for now   Metastatic prostate cancer - patient currently on home hospice  -[facility] patient hospice contact phone number: [phone]   Essential hypertension  Not on home BP meds  Diabetes mellitus type II, not currently active --recent A1c 4.4, not on hypoglycemics at home --d/c finger sticks and SSI   Hx of ETOH use  -CIWA protocol    DVT prophylaxis: SCD/Compression stockings Code Status: DNR  Family Communication: updated on speaker phone at bedside Level of care: Med-Surg Dispo:   The patient is from: home Anticipated d/c is to: home Anticipated d/c date is: tomorrow   Subjective and Interval History:  Pt reported feeling much better.  No more diarrhea since presentation.  N/V improved and tolerating oral intake.   Objective: Vitals:   05/29/23 0900 05/29/23 0940 05/29/23 1100 05/29/23 1654  BP: (!) 144/102  (!) 160/106 (!) 144/103 (!) 146/95  Pulse: 87 79 80 85  Resp: 14 16 14 14   Temp:   98 F (36.7 C) 98 F (36.7 C)  TempSrc:   Oral   SpO2: 100% 100% 100% 100%  Weight:      Height:        Intake/Output Summary (Last 24 hours) at 05/29/2023 1924 Last data filed at 05/29/2023 1721 Gross per 24 hour  Intake 4140 ml  Output 325 ml  Net 3815 ml   Filed Weights   05/28/23 1858  Weight: 72.6 kg    Examination:   Constitutional: NAD, AAOx3 HEENT: conjunctivae and lids normal, EOMI CV: No cyanosis.   RESP: normal respiratory effort, on RA Neuro: II - XII grossly intact.     Data Reviewed: I have personally reviewed labs and imaging studies  Time spent: 50 minutes  Darlin Priestly, MD [facility] If 7PM-7AM, please contact night-coverage 05/29/2023, 7:24 PM

## 2023-05-29 NOTE — TOC Benefit Eligibility Note (Signed)
Pharmacy Patient Advocate Encounter  Insurance verification completed.    The patient is insured through [facility] Medicare Part D  Ran test claim for Dificid 200 mg and the current 10 day co-pay is $0.00.   This test claim was processed through [facility]- copay amounts may vary at other pharmacies due to pharmacy/plan contracts, or as the patient moves through the different stages of their insurance plan.    Roland Earl, CPHT Pharmacy Patient Advocate Specialist [facility] Patient Advocate Team Direct Number: [phone]  Fax: [phone]

## 2023-05-30 LAB — TYPE AND SCREEN
ABO/RH(D): O POS
Antibody Screen: NEGATIVE
Unit division: 0
Unit division: 0

## 2023-05-30 LAB — CULTURE, BLOOD (ROUTINE X 2): Culture: NO GROWTH

## 2023-05-30 LAB — URINE CULTURE

## 2023-05-30 LAB — BASIC METABOLIC PANEL
Anion gap: 8 (ref 5–15)
BUN: 9 mg/dL (ref 8–23)
CO2: 23 mmol/L (ref 22–32)
Calcium: 6.6 mg/dL — ABNORMAL LOW (ref 8.9–10.3)
Chloride: 102 mmol/L (ref 98–111)
Creatinine, Ser: 0.9 mg/dL (ref 0.61–1.24)
GFR, Estimated: >60 mL/min (ref >60)
Glucose, Bld: 89 mg/dL (ref 70–99)
Potassium: 3.5 mmol/L (ref 3.5–5.1)
Sodium: 135 mmol/L (ref 135–145)

## 2023-05-30 LAB — CBC
HCT: 25.8 % — ABNORMAL LOW (ref 39.0–52.0)
Hemoglobin: 8.6 g/dL — ABNORMAL LOW (ref 13.0–17.0)
MCH: 31.3 pg (ref 26.0–34.0)
MCHC: 33.3 g/dL (ref 30.0–36.0)
MCV: 93.8 fL (ref 80.0–100.0)
Platelets: 62 10*3/uL — ABNORMAL LOW (ref 150–400)
RBC: 2.75 MIL/uL — ABNORMAL LOW (ref 4.22–5.81)
RDW: 19.2 % — ABNORMAL HIGH (ref 11.5–15.5)
WBC: 9.6 10*3/uL (ref 4.0–10.5)
nRBC: 0 % (ref 0.0–0.2)

## 2023-05-30 LAB — GLUCOSE, CAPILLARY
Glucose-Capillary: 91 mg/dL (ref 70–99)
Glucose-Capillary: 80 mg/dL (ref 70–99)
Glucose-Capillary: 102 mg/dL — ABNORMAL HIGH (ref 70–99)

## 2023-05-30 LAB — BPAM RBC
Blood Product Expiration Date: 202408092359
ISSUE DATE / TIME: 202407022319
ISSUE DATE / TIME: 202407030229

## 2023-05-30 LAB — MAGNESIUM: Magnesium: 1.4 mg/dL — ABNORMAL LOW (ref 1.7–2.4)

## 2023-05-30 LAB — LACTIC ACID, PLASMA: Lactic Acid, Venous: 2 mmol/L (ref 0.5–1.9)

## 2023-05-30 MED ORDER — ADULT MULTIVITAMIN W/MINERALS CH: 1.0000 | ORAL_TABLET | Freq: Every day | ORAL | Status: DC

## 2023-05-30 MED ORDER — MAGNESIUM SULFATE 4 GM/100ML IV SOLN
4.0000 g | Freq: Once | INTRAVENOUS | Status: AC
  Administered 2023-05-30: 4 g via INTRAVENOUS
  Filled 2023-05-30: qty 100

## 2023-05-30 NOTE — TOC Progression Note (Signed)
Transition of Care [facility]) - Progression Note    Patient Details  Name: Roger [name] MRN: [id number] Date of Birth: Aug 06, 1956  Transition of Care [facility]) CM/SW Contact  Garret Reddish, RN Phone Number: 05/30/2023, 10:55 AM  Clinical Narrative:   Chart reviewed.  Noted that patient was admitted with UTI associated with Sepsis, and Symptomatic anemia of chronic disease.  Patient is currently on IV Ceftriaxone.  Urine culture is pending.  Patient has received 2 S/p 2u pRBC.  Noted that patient also has Metastatic Prostate CA.  Patient is an active patient of [facility] for home hospice.   [facility] has assisted with arranging transportation for Medical appointments.  [facility] has arranged [facility] to assist with medical transports.  TOC will continue to follow for discharge planning.            Expected Discharge Plan and Services                                               Social Determinants of Health (SDOH) Interventions SDOH Screenings   Food Insecurity: Food Insecurity Present (02/26/2023)  Housing: Patient Declined (02/26/2023)  Transportation Needs: Unmet Transportation Needs (02/08/2023)  Utilities: Not At Risk (02/26/2023)  Alcohol Screen: Low Risk  (04/05/2023)  Depression (PHQ2-9): Low Risk  (04/05/2023)  Financial Resource Strain: High Risk (11/28/2022)  Physical Activity: Inactive (11/28/2022)  Social Connections: Socially Isolated (11/30/2022)  Stress: Stress Concern Present (11/30/2022)  Tobacco Use: Medium Risk (05/29/2023)    Readmission Risk Interventions     No data to display

## 2023-05-30 NOTE — Progress Notes (Signed)
[facility]) Hospital Liaison Note   Maurice Morgenroth is a current [facility] patient with a terminal diagnosis of malignant neoplasm of the prostate. Patient/family activated EMS as he had nausea, vomiting and increased weakness throughout the day. Patient was admitted to [facility] 7.2.2024 for UTI with associated sepsis and symptomatic anemia.   Patient is GIP appropriate for treatment of infection with IV antibiotics, blood transfusion, electrolyte replacement and continued monitoring.   VS: 98.5 oral ,154/103, 89, 17, 100% on room air   I/O:120/625   Abnormal Labs: Calcium 6.6, Magnesium 1.4, RBC 2.75, Hemoglobin 8.6, HCT 25.8, RDW 19.2, Platelets 62   Diagnostics:  Chest Xray 7.2.2024 IMPRESSION: Mild left basilar atelectasis.   Changes consistent with prior metastatic prostate carcinoma   IV/PRN Medications:  NS 19ml/hr cont IV, Rocephin 2 G IV Q 24 hours X 1, ondansetron 4mg  po X 1, oxycodone 5mg  q 4 hr prn po X 1   Assessment/Plan   UTI with associated Sepsis  - admit to progressive care  - continue on CTX  -f/u on culture data    Symptomatic anemia of chronic disease  - Macrocytic anemia  - transfuse 2 unit prbc -patient and significant other has agreed to blood transfusion      Hypokalemia  -replete  -check mag level    Hx of C- dif  - patient with complaints of loose stool and abdominal pain  - will start fidaxomicin in setting of sepsis and recent infection tx with po vanc  - f/u on stools studies  -de-escalate abx as able    Metastatic prostate cancer - patient currently on home hospice  -[facility] patient hospice contact phone number: [phone] - DNR    Essential hypertension    Diabetes mellitus type II - ADA diet  - finger sticks  -ISS   Hx of ETOH use  -CIWA protocol    Discharge Planning: Return home with hospice when medically optimized   Family Contact: Patient sitting up in bed today, alert and oriented X 3.   He stated that he is feeling better.  His friend, Luetta Nutting, was visiting.     IDT: updated   Goals of Care: DNR, treat the treatable   Medication list and transfer summary placed on patients chart.   Please call with any hospice questions or concerns.  [facility] Liaison [phone]

## 2023-05-30 NOTE — TOC Transition Note (Signed)
Transition of Care [facility]) - CM/SW Discharge Note   Patient Details  Name: Roger [name] MRN: [id number] Date of Birth: 05/16/1956  Transition of Care [facility]) CM/SW Contact:  Garret Reddish, RN Phone Number: 05/30/2023, 5:02 PM   Clinical Narrative:   Chart reviewed.  Noted that patient will be a discharge for today.  Patient is an active home Hospice patient with [facility]. I have made Annice Pih with [facility] that patient will be a discharge for today.    I have informed patients son that he will be going home today.  I have informed Renae Fickle that patient will be transported home via [facility].  Renae Fickle reports that he will be available to let patient in the home.    I have informed staff nurse of the above information.      Final next level of care: Home w Hospice Care Barriers to Discharge: No Barriers Identified   Patient Goals and CMS Choice      Discharge Placement                  Patient to be transferred to facility by: [facility] Name of family member notified: Azende Alger Patient and family notified of of transfer: 05/30/23  Discharge Plan and Services Additional resources added to the After Visit Summary for                            [facility] Arranged:  (Patient is active with [facility])          Social Determinants of Health (SDOH) Interventions SDOH Screenings   Food Insecurity: Food Insecurity Present (02/26/2023)  Housing: Patient Declined (02/26/2023)  Transportation Needs: Unmet Transportation Needs (02/08/2023)  Utilities: Not At Risk (02/26/2023)  Alcohol Screen: Low Risk  (04/05/2023)  Depression (PHQ2-9): Low Risk  (04/05/2023)  Financial Resource Strain: High Risk (11/28/2022)  Physical Activity: Inactive (11/28/2022)  Social Connections: Socially Isolated (11/30/2022)  Stress: Stress Concern Present (11/30/2022)  Tobacco Use: Medium Risk (05/29/2023)     Readmission Risk Interventions     No data to display

## 2023-05-30 NOTE — Plan of Care (Signed)
Pt was supposed to be picked up by EMS to take home.  Think pt got tired of waiting and a friend named Renae Fickle came to pick him up. Renae Fickle had his wheelchair. Pt was helped into wheelchair and accompanied to exit by Liz Beach, Charity fundraiser.  DNR form given to take home.  EMS transport cancelled.

## 2023-05-30 NOTE — Progress Notes (Signed)
[facility]- Civil engineer, contracting [facility])  Patient will discharge home this evening.  Hospice home care team notified of discharge.  Please don't hesitate to call with any Hospice related questions or concerns.    Thank you for the opportunity to participate in this patient's care. [facility] Liaison [phone]

## 2023-05-30 NOTE — Plan of Care (Signed)
  Problem: Education: Goal: Knowledge of General Education information will improve Description: Including pain rating scale, medication(s)/side effects and non-pharmacologic comfort measures Outcome: Completed/Met   Problem: Health Behavior/Discharge Planning: Goal: Ability to manage health-related needs will improve Outcome: Completed/Met   Problem: Clinical Measurements: Goal: Ability to maintain clinical measurements within normal limits will improve Outcome: Completed/Met Goal: Will remain free from infection Outcome: Completed/Met Goal: Diagnostic test results will improve Outcome: Completed/Met Goal: Respiratory complications will improve Outcome: Completed/Met Goal: Cardiovascular complication will be avoided Outcome: Completed/Met   Problem: Activity: Goal: Risk for activity intolerance will decrease Outcome: Completed/Met   Problem: Nutrition: Goal: Adequate nutrition will be maintained Outcome: Completed/Met   Problem: Coping: Goal: Level of anxiety will decrease Outcome: Completed/Met   Problem: Elimination: Goal: Will not experience complications related to bowel motility Outcome: Completed/Met Goal: Will not experience complications related to urinary retention Outcome: Completed/Met   Problem: Pain Managment: Goal: General experience of comfort will improve Outcome: Completed/Met   Problem: Safety: Goal: Ability to remain free from injury will improve Outcome: Completed/Met   Problem: Skin Integrity: Goal: Risk for impaired skin integrity will decrease Outcome: Completed/Met   Problem: Malnutrition  (NI-5.2) Goal: Food and/or nutrient delivery Description: Individualized approach for food/nutrient provision. Outcome: Completed/Met   Problem: Education: Goal: Knowledge of General Education information will improve Description: Including pain rating scale, medication(s)/side effects and non-pharmacologic comfort measures Outcome: Completed/Met

## 2023-05-30 NOTE — Discharge Summary (Signed)
Physician Discharge Summary   Roger [name]  male DOB: 1956-09-09  JXB:[id number]  PCP: Alfredia Ferguson, PA-C  Admit date: 05/28/2023 Discharge date: 05/30/2023  Admitted From: home Disposition:  home CODE STATUS: DNR   Hospital Course:  For full details, please see H&P, progress notes, consult notes and ancillary notes.  Briefly,  Roger [name] is a 67 y.o. male with medical history significant of  metastatic prostate cancer on hospice, hypertension, diabetes, anemia of chronic disease, morbid obesity, recurrent UTI who presented to ED with generalized weakness, n/v/d, poor oral intake x weeks but worse over the last few days.    N/V/D likely 2/2 Gastroenteritis  --no diarrhea since presentation.  N/V also resolved after presentation.  Pt was able to tolerate oral intake.  UTI, ruled out --pt denied urinary symptom the day after presentation.  Urine cx grew multiple species, suggesting chronic colonization.  Abx was not continued.   Symptomatic anemia of chronic disease  --received 2u pRBC   Lactic acidosis --lactic acid severely elevated on presentation, >9, unclear etiology.   --pt received MIVF with improvement of lactic acid to 2.0 prior to discharge.   Hypokalemia  --monitored and repleted PRN   Metastatic prostate cancer - patient currently on home hospice    Essential hypertension  Not on home BP meds   Diabetes mellitus type II, not currently active --recent A1c 4.4, not on hypoglycemics at home  Moderate malnutrition in context of chronic illness    Hx of ETOH use  --no withdrawal  Sepsis, ruled out   Discharge Diagnoses:  Principal Problem:   Symptomatic anemia   30 Day Unplanned Readmission Risk Score    Flowsheet Row ED to Hosp-Admission (Current) from 05/28/2023 in [facility] 1C MEDICAL TELEMETRY  30 Day Unplanned Readmission Risk Score (%) 20 Filed at 05/30/2023 1600       This score is the patient's risk of an  unplanned readmission within 30 days of being discharged (0 -100%). The score is based on dignosis, age, lab data, medications, orders, and past utilization.   Low:  0-14.9   Medium: 15-21.9   High: 22-29.9   Extreme: 30 and above         Discharge Instructions:  Allergies as of 05/30/2023       Reactions   Albuterol Nausea And Vomiting   Mucinex [guaifenesin Er] Rash        Medication List     STOP taking these medications    nitroGLYCERIN 0.4 MG SL tablet Commonly known as: NITROSTAT       TAKE these medications    folic acid 1 MG tablet Commonly known as: FOLVITE Take 1 tablet (1 mg total) by mouth daily.   loperamide 2 MG capsule Commonly known as: IMODIUM Take 2 mg by mouth as needed.   LORazepam 0.5 MG tablet Commonly known as: ATIVAN Take 0.5 mg by mouth every 4 (four) hours as needed.   multivitamin with minerals Tabs tablet Take 1 tablet by mouth daily. Start taking on: May 31, 2023   ondansetron 4 MG disintegrating tablet Commonly known as: ZOFRAN-ODT Take 4 mg by mouth every 6 (six) hours as needed.   oxyCODONE 5 MG immediate release tablet Commonly known as: Oxy IR/ROXICODONE Take 5 mg by mouth every 4 (four) hours as needed.         Follow-up Information     Alfredia Ferguson, PA-C Follow up in 1 week(s).   Specialty: Physician Assistant Contact  information: [address] [place] [address] [phone]                 Allergies  Allergen Reactions   Albuterol Nausea And Vomiting   Mucinex [Guaifenesin Er] Rash     The results of significant diagnostics from this hospitalization (including imaging, microbiology, ancillary and laboratory) are listed below for reference.   Consultations:   Procedures/Studies: DG Chest Port 1 View  Result Date: 05/28/2023 CLINICAL DATA:  Possible sepsis, initial encounter EXAM: PORTABLE CHEST 1 VIEW COMPARISON:  02/07/2023 FINDINGS: Cardiac shadow is mildly prominent but  stable. Lungs are well aerated bilaterally. Very minimal left basilar atelectasis is noted new from the previous exam. Multiple sclerotic foci are noted throughout the bony skeleton consistent with metastatic disease. IMPRESSION: Mild left basilar atelectasis. Changes consistent with prior metastatic prostate carcinoma Electronically Signed   By: Alcide Clever M.D.   On: 05/28/2023 19:34      Labs: BNP (last 3 results) Recent Labs    04/05/23 1631  BNP 203.5*   Basic Metabolic Panel: Recent Labs  Lab 05/28/23 1900 05/29/23 0738 05/30/23 0725  NA 136 140 135  K 3.3* 3.9 3.5  CL 100 104 102  CO2 19* 19* 23  GLUCOSE 105* 86 89  BUN 9 10 9   CREATININE 1.09 0.96 0.90  CALCIUM 7.4* 7.0* 6.6*  MG  --   --  1.4*   Liver Function Tests: Recent Labs  Lab 05/28/23 1900 05/29/23 0738  AST 163* 118*  ALT 34 30  ALKPHOS 337* 267*  BILITOT 2.0* 1.1  PROT 4.8* 4.3*  ALBUMIN 2.0* 1.7*   No results for input(s): "LIPASE", "AMYLASE" in the last 168 hours. No results for input(s): "AMMONIA" in the last 168 hours. CBC: Recent Labs  Lab 05/28/23 1900 05/29/23 0738 05/30/23 0725  WBC 11.7* 10.2 9.6  NEUTROABS 9.3*  --   --   HGB 6.0* 9.1* 8.6*  HCT 18.3* 28.3* 25.8*  MCV 102.8* 95.9 93.8  PLT 104* 75* 62*   Cardiac Enzymes: No results for input(s): "CKTOTAL", "CKMB", "CKMBINDEX", "TROPONINI" in the last 168 hours. BNP: Invalid input(s): "POCBNP" CBG: Recent Labs  Lab 05/29/23 1144 05/29/23 1654 05/29/23 2003 05/30/23 0747 05/30/23 1234  GLUCAP 75 82 82 91 80   D-Dimer No results for input(s): "DDIMER" in the last 72 hours. Hgb A1c No results for input(s): "HGBA1C" in the last 72 hours. Lipid Profile No results for input(s): "CHOL", "HDL", "LDLCALC", "TRIG", "CHOLHDL", "LDLDIRECT" in the last 72 hours. Thyroid function studies No results for input(s): "TSH", "T4TOTAL", "T3FREE", "THYROIDAB" in the last 72 hours.  Invalid input(s): "FREET3" Anemia work up No  results for input(s): "VITAMINB12", "FOLATE", "FERRITIN", "TIBC", "IRON", "RETICCTPCT" in the last 72 hours. Urinalysis    Component Value Date/Time   COLORURINE AMBER (A) 05/28/2023 1901   APPEARANCEUR HAZY (A) 05/28/2023 1901   LABSPEC 1.020 05/28/2023 1901   PHURINE 5.0 05/28/2023 1901   GLUCOSEU NEGATIVE 05/28/2023 1901   HGBUR NEGATIVE 05/28/2023 1901   BILIRUBINUR SMALL (A) 05/28/2023 1901   KETONESUR NEGATIVE 05/28/2023 1901   PROTEINUR 30 (A) 05/28/2023 1901   NITRITE NEGATIVE 05/28/2023 1901   LEUKOCYTESUR SMALL (A) 05/28/2023 1901   Sepsis Labs Recent Labs  Lab 05/28/23 1900 05/29/23 0738 05/30/23 0725  WBC 11.7* 10.2 9.6   Microbiology Recent Results (from the past 240 hour(s))  Urine Culture     Status: Abnormal   Collection Time: 05/28/23  7:01 PM   Specimen: Urine,  Random  Result Value Ref Range Status   Specimen Description   Final    URINE, RANDOM Performed at [facility], [address] [place], [place] [address]    Special Requests   Final    NONE Reflexed from [id number] Performed at [facility], [address] [place], [place] [address]    Culture MULTIPLE SPECIES PRESENT, SUGGEST RECOLLECTION (A)  Final   Report Status 05/30/2023 FINAL  Final  Resp panel by RT-PCR (RSV, Flu A&B, Covid) Anterior Nasal Swab     Status: None   Collection Time: 05/28/23  7:10 PM   Specimen: Anterior Nasal Swab  Result Value Ref Range Status   SARS Coronavirus 2 by RT PCR NEGATIVE NEGATIVE Final    Comment: (NOTE) SARS-CoV-2 target nucleic acids are NOT DETECTED.  The SARS-CoV-2 RNA is generally detectable in upper respiratory specimens during the acute phase of infection. The lowest concentration of SARS-CoV-2 viral copies this assay can detect is 138 copies/mL. A negative result does not preclude SARS-Cov-2 infection and should not be used as the sole basis for treatment or other patient management decisions. A negative result may occur with   improper specimen collection/handling, submission of specimen other than nasopharyngeal swab, presence of viral mutation(s) within the areas targeted by this assay, and inadequate number of viral copies(<138 copies/mL). A negative result must be combined with clinical observations, patient history, and epidemiological information. The expected result is Negative.  Fact Sheet for Patients:  BloggerCourse.com  Fact Sheet for Healthcare Providers:  SeriousBroker.it  This test is no t yet approved or cleared by the [place] FDA and  has been authorized for detection and/or diagnosis of SARS-CoV-2 by FDA under an Emergency Use Authorization (EUA). This EUA will remain  in effect (meaning this test can be used) for the duration of the COVID-19 declaration under Section 564(b)(1) of the Act, 21 U.S.C.section 360bbb-3(b)(1), unless the authorization is terminated  or revoked sooner.       Influenza A by PCR NEGATIVE NEGATIVE Final   Influenza B by PCR NEGATIVE NEGATIVE Final    Comment: (NOTE) The Xpert Xpress SARS-CoV-2/FLU/RSV plus assay is intended as an aid in the diagnosis of influenza from Nasopharyngeal swab specimens and should not be used as a sole basis for treatment. Nasal washings and aspirates are unacceptable for Xpert Xpress SARS-CoV-2/FLU/RSV testing.  Fact Sheet for Patients: BloggerCourse.com  Fact Sheet for Healthcare Providers: SeriousBroker.it  This test is not yet approved or cleared by the [place] FDA and has been authorized for detection and/or diagnosis of SARS-CoV-2 by FDA under an Emergency Use Authorization (EUA). This EUA will remain in effect (meaning this test can be used) for the duration of the COVID-19 declaration under Section 564(b)(1) of the Act, 21 U.S.C. section 360bbb-3(b)(1), unless the authorization is terminated or revoked.      Resp Syncytial Virus by PCR NEGATIVE NEGATIVE Final    Comment: (NOTE) Fact Sheet for Patients: BloggerCourse.com  Fact Sheet for Healthcare Providers: SeriousBroker.it  This test is not yet approved or cleared by the [place] FDA and has been authorized for detection and/or diagnosis of SARS-CoV-2 by FDA under an Emergency Use Authorization (EUA). This EUA will remain in effect (meaning this test can be used) for the duration of the COVID-19 declaration under Section 564(b)(1) of the Act, 21 U.S.C. section 360bbb-3(b)(1), unless the authorization is terminated or revoked.  Performed at [facility], [address] [place], [place] [address]   Blood  Culture (routine x 2)     Status: None (Preliminary result)   Collection Time: 05/28/23  7:10 PM   Specimen: BLOOD  Result Value Ref Range Status   Specimen Description BLOOD BLOOD LEFT ARM  Final   Special Requests   Final    BOTTLES DRAWN AEROBIC AND ANAEROBIC Blood Culture results may not be optimal due to an excessive volume of blood received in culture bottles   Culture   Final    NO GROWTH 2 DAYS Performed at [facility], [address] [place], [place] [address]    Report Status PENDING  Incomplete  Blood Culture (routine x 2)     Status: None (Preliminary result)   Collection Time: 05/28/23  7:10 PM   Specimen: BLOOD  Result Value Ref Range Status   Specimen Description BLOOD BLOOD LEFT ARM  Final   Special Requests   Final    BOTTLES DRAWN AEROBIC AND ANAEROBIC Blood Culture results may not be optimal due to an excessive volume of blood received in culture bottles   Culture   Final    NO GROWTH 2 DAYS Performed at [facility], [address] [place], [place] [address]    Report Status PENDING  Incomplete     Total time spend on discharging this patient, including the last patient exam, discussing the hospital stay,  instructions for ongoing care as it relates to all pertinent caregivers, as well as preparing the medical discharge records, prescriptions, and/or referrals as applicable, is 45 minutes.    Darlin Priestly, MD  [facility] 05/30/2023, 4:22 PM

## 2023-05-31 LAB — CULTURE, BLOOD (ROUTINE X 2)

## 2023-06-01 LAB — CULTURE, BLOOD (ROUTINE X 2): Culture: NO GROWTH

## 2023-06-02 LAB — CULTURE, BLOOD (ROUTINE X 2)

## 2023-06-03 ENCOUNTER — Telehealth: Payer: Self-pay

## 2023-06-03 NOTE — Transitions of Care (Post Inpatient/ED Visit) (Signed)
   06/03/2023  Name: Roger [name] MRN: [id number] DOB: [birth year]  Today's TOC FU Call Status: Today's TOC FU Call Status:: Successful TOC FU Call Competed TOC FU Call Complete Date: 06/03/23  Transition Care Management Follow-up Telephone Call Date of Discharge: 05/30/23 Discharge Facility: [facility]) Type of Discharge: Inpatient Admission Primary Inpatient Discharge Diagnosis:: sepsis How have you been since you were released from the hospital?: Better Any questions or concerns?: No  Items Reviewed: Did you receive and understand the discharge instructions provided?: Yes Medications obtained,verified, and reconciled?: Yes (Medications Reviewed) Any new allergies since your discharge?: No Dietary orders reviewed?: Yes Do you have support at home?: Yes People in Home: child(ren), adult  Medications Reviewed Today: Medications Reviewed Today     Reviewed by Karena Addison, LPN (Licensed Practical Nurse) on 06/03/23 at [phone]  Med List Status: <None>   Medication Order Taking? Sig Documenting Provider Last Dose Status Informant  folic acid (FOLVITE) 1 MG tablet [id number] No Take 1 tablet (1 mg total) by mouth daily. Rickard Patience, MD Past Week Active Pharmacy Records, Multiple Informants  loperamide (IMODIUM) 2 MG capsule [id number] No Take 2 mg by mouth as needed. [provider] unk unk Active Pharmacy Records, Multiple Informants  LORazepam (ATIVAN) 0.5 MG tablet [id number] No Take 0.5 mg by mouth every 4 (four) hours as needed. [provider] unk unk Active Pharmacy Records, Multiple Informants  Multiple Vitamin (MULTIVITAMIN WITH MINERALS) TABS tablet [id number]  Take 1 tablet by mouth daily. Darlin Priestly, MD  Active   ondansetron (ZOFRAN-ODT) 4 MG disintegrating tablet [id number] No Take 4 mg by mouth every 6 (six) hours as needed. [provider] unk unk Active Pharmacy Records, Multiple Informants  oxyCODONE (OXY IR/ROXICODONE) 5  MG immediate release tablet [id number] No Take 5 mg by mouth every 4 (four) hours as needed. [provider] unk unk Active Pharmacy Records, Multiple Informants            Home Care and Equipment/Supplies: Were Home Health Services Ordered?: NA Any new equipment or medical supplies ordered?: NA  Functional Questionnaire: Do you need assistance with bathing/showering or dressing?: Yes Do you need assistance with meal preparation?: Yes Do you need assistance with eating?: No Do you have difficulty maintaining continence: No Do you need assistance with getting out of bed/getting out of a chair/moving?: Yes Do you have difficulty managing or taking your medications?: No  Follow up appointments reviewed: PCP Follow-up appointment confirmed?: Yes Date of PCP follow-up appointment?: 06/04/23 Follow-up Provider: [facility] Follow-up appointment confirmed?: NA Do you need transportation to your follow-up appointment?: No Do you understand care options if your condition(s) worsen?: Yes-patient verbalized understanding    SIGNATURE Karena Addison, LPN [facility] Nurse Health Advisor Direct Dial [phone]

## 2023-06-04 ENCOUNTER — Ambulatory Visit: Payer: 59 | Admitting: Physician Assistant

## 2023-06-05 ENCOUNTER — Telehealth: Payer: Self-pay

## 2023-06-05 ENCOUNTER — Ambulatory Visit: Payer: 59 | Admitting: Urology

## 2023-06-05 ENCOUNTER — Ambulatory Visit: Admitting: *Deleted

## 2023-06-05 ENCOUNTER — Ambulatory Visit: Payer: 59 | Admitting: Physician Assistant

## 2023-06-05 ENCOUNTER — Telehealth: Payer: Self-pay | Admitting: Physician Assistant

## 2023-06-05 NOTE — Telephone Encounter (Signed)
Kayhan Boardley is calling in on behalf of pt requesting to speak with the Clinical Social Worker. Please follow up with [facility].

## 2023-06-05 NOTE — Telephone Encounter (Signed)
Copied from CRM [id number]. Topic: Transportation - Transportation >> Jun 05, 2023 12:04 PM Marlow Baars wrote: Reason for CRM: The patient called in to schedule his 4 month follow up for September the 10th at 11 and will need transportation for his visit as he is wheelchair bound. Please assist patient further.

## 2023-06-06 ENCOUNTER — Ambulatory Visit: Payer: Self-pay | Admitting: *Deleted

## 2023-06-06 ENCOUNTER — Telehealth: Payer: Self-pay

## 2023-06-06 NOTE — Patient Instructions (Signed)
Visit Information  Thank you for taking time to visit with me today. Please don't hesitate to contact me if I can be of assistance to you.   Following are the goals we discussed today:  Please contact customer service for medicaid to confirm coverage to determine services that ca be provided.  Our next appointment is by telephone on 06/06/23 at 12pm  Please call the care guide team at [phone] if you need to cancel or reschedule your appointment.   If you are experiencing a Mental Health or Behavioral Health Crisis or need someone to talk to, please call 911   Patient verbalizes understanding of instructions and care plan provided today and agrees to view in MyChart. Active MyChart status and patient understanding of how to access instructions and care plan via MyChart confirmed with patient.     Telephone follow up appointment with care management team member scheduled for: 06/06/23  Verna Czech, LCSW Clinical Social Worker  [facility] Care Management [phone]

## 2023-06-06 NOTE — Patient Outreach (Addendum)
  Care Coordination   Follow Up Visit Note   06/06/2023  late entry Name: Roger [name] MRN: [id number] DOB: December 23, 1955  Roger [name] is a 67 y.o. year old male who sees Roger [name], Roger [name], Roger [name] for primary care. I spoke with  Roger [name] and friend Roger [name] by phone on 06/05/23.  What matters to the patients health and wellness today?  In home care support and transportation. Patient able to utilize Roger [name] transportation for appointments at the Roger [name], patient enrolled in Roger [name] transportation for all other appointments and understands to call and coordinate [phone]. Roger [name] will contact patient and family friend as well to review Hospice care services.   Goals Addressed             This Visit's Progress    "I would like additonal in home care"        Interventions Today    Flowsheet Row Most Recent Value  Chronic Disease   Chronic disease during today's visit Hypertension (HTN)  General Interventions   General Interventions Discussed/Reviewed General Interventions Reviewed, Community Resources, Level of Care  [Confirmed that Pt is active with Hospice, however needs additional services including in home care and transportion coord-confirmed that pt is able to use Roger [name] transport for appts with the cancer center. Patient enrolled with Roger [name] and can utilize as well]  Level of Care Applications, Personal Care Services  Roger [name] services discussed, however discussed need to confirm Roger [name] coverage before this service can be applied for]  Applications Roger [name]  [Roger [name] previously confirmed as MQB covering premiums only, pt discussed feeling otherwise, will  plan to call Roger [name] to confirm-may need Roger [name] re-evaluated]  Education Interventions   Applications Roger [name]  [Roger [name] previously confirmed as MQB covering premiums only, pt discussed feeling otherwise, will  plan to call Roger [name] to confirm-may need Roger [name] re-evaluated]              SDOH assessments and interventions  completed:  No     Care Coordination Interventions:  Yes, provided   Follow up plan: Follow up call scheduled for 06/06/23    Encounter Outcome:  Pt. Visit Completed

## 2023-06-06 NOTE — Patient Instructions (Signed)
Visit Information  Thank you for taking time to visit with me today. Please don't hesitate to contact me if I can be of assistance to you.  Please call [employer] for transportation needs [phone] Please continue to follow up with Hospice services received through [facility]    If you are experiencing a Mental Health or Behavioral Health Crisis or need someone to talk to, please call 911   Patient verbalizes understanding of instructions and care plan provided today and agrees to view in MyChart. Active MyChart status and patient understanding of how to access instructions and care plan via MyChart confirmed with patient.     Telephone follow up appointment with care management team member scheduled for:   [facility], [place] Clinical Social Worker  [facility] Care Management [phone]

## 2023-06-06 NOTE — Patient Outreach (Addendum)
  Care Coordination   Follow Up Visit Note   06/06/2023 Name: Roger [name] MRN: [id number] DOB: Mar 21, 1956  Roger [name] is a 67 y.o. year old male who sees Roger [name], Roger [name], [place] for primary care. I spoke with  Roger [name] by phone today.  What matters to the patients health and wellness today?  Patient's Medicaid status confirmed. Patient has Medicaid part b that covers his premiums. Patient to continue with Hospice care through [facility]. RNCM to follow up as well.    Goals Addressed             This Visit's Progress    COMPLETED: "I need transportation to my doctor's appointments"       Activities and task to complete in order to accomplish goals.   Patient approved for [employer] [id number] patient agreeable to call to schedule rides to medical appointments       COMPLETED: "I would like additonal in home care"        Interventions Today    Flowsheet Row Most Recent Value  Chronic Disease   Chronic disease during today's visit Other  [prostate cancer]  General Interventions   General Interventions Discussed/Reviewed General Interventions Reviewed, Communication with  Communication with --  [Collaboration phone call -Roger [name] medicaid ref# [id number] confirmed that patient has Medicaid MQB that covers his premiums, deductible and co-insurance ony]  Level of Care Personal Care Services  [the type of Medicaid that patient has does not cover PCS services. Patient to continue with Hospice services]  Applications Medicaid  [patient encouraged to request re-evaluation by Medicaid if income has changed]  Education Interventions   Applications Medicaid  [patient encouraged to request re-evaluation by Medicaid if income has changed]              SDOH assessments and interventions completed:  No     Care Coordination Interventions:  Yes, provided   Follow up plan: No further intervention required.   Encounter Outcome:  Pt. Visit Completed

## 2023-06-07 ENCOUNTER — Encounter: Payer: Self-pay | Admitting: Physician Assistant

## 2023-06-07 ENCOUNTER — Telehealth: Payer: Self-pay

## 2023-06-13 ENCOUNTER — Inpatient Hospital Stay: Payer: 59

## 2023-06-13 ENCOUNTER — Encounter: Payer: Self-pay | Admitting: Oncology

## 2023-06-13 ENCOUNTER — Inpatient Hospital Stay: Payer: 59 | Attending: Hospice and Palliative Medicine | Admitting: Oncology

## 2023-06-13 NOTE — Assessment & Plan Note (Deleted)
CT images and pathology were reviewed with patient.  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  Recommend to continue androgen deprivation therapy-Eligard 45mg  Q41m, last given on 12/05/2022 by urology He is a not a candidate for Decetaxel treatment. Repeat PSA today showed levels of 0.56.  Likely disease is still castration sensitive. If patient is interested in further treatments, recommend ARPI, can consider resuming Apalutamide 240mg  daily.   Refer to palliative care for further discussion.

## 2023-06-27 ENCOUNTER — Other Ambulatory Visit: Payer: Self-pay | Admitting: Oncology

## 2023-07-05 ENCOUNTER — Emergency Department

## 2023-07-05 ENCOUNTER — Other Ambulatory Visit: Payer: Self-pay

## 2023-07-05 ENCOUNTER — Inpatient Hospital Stay
Admission: EM | Admit: 2023-07-05 | Discharge: 2023-07-06 | DRG: 640 | Disposition: A | Discharge status: Hospice | Attending: Internal Medicine | Admitting: Internal Medicine

## 2023-07-05 DIAGNOSIS — E162 Hypoglycemia, unspecified: Secondary | ICD-10-CM | POA: Diagnosis not present

## 2023-07-05 DIAGNOSIS — D539 Nutritional anemia, unspecified: Secondary | ICD-10-CM | POA: Diagnosis not present

## 2023-07-05 DIAGNOSIS — R069 Unspecified abnormalities of breathing: Secondary | ICD-10-CM | POA: Diagnosis not present

## 2023-07-05 DIAGNOSIS — R0989 Other specified symptoms and signs involving the circulatory and respiratory systems: Secondary | ICD-10-CM | POA: Diagnosis not present

## 2023-07-05 DIAGNOSIS — D649 Anemia, unspecified: Principal | ICD-10-CM

## 2023-07-05 DIAGNOSIS — E871 Hypo-osmolality and hyponatremia: Secondary | ICD-10-CM | POA: Diagnosis not present

## 2023-07-05 DIAGNOSIS — E876 Hypokalemia: Secondary | ICD-10-CM | POA: Diagnosis not present

## 2023-07-05 DIAGNOSIS — Z66 Do not resuscitate: Secondary | ICD-10-CM | POA: Diagnosis not present

## 2023-07-05 DIAGNOSIS — R627 Adult failure to thrive: Secondary | ICD-10-CM | POA: Diagnosis not present

## 2023-07-05 DIAGNOSIS — R112 Nausea with vomiting, unspecified: Secondary | ICD-10-CM | POA: Diagnosis present

## 2023-07-05 DIAGNOSIS — R630 Anorexia: Secondary | ICD-10-CM | POA: Diagnosis present

## 2023-07-05 DIAGNOSIS — D696 Thrombocytopenia, unspecified: Secondary | ICD-10-CM | POA: Diagnosis not present

## 2023-07-05 DIAGNOSIS — I10 Essential (primary) hypertension: Secondary | ICD-10-CM | POA: Diagnosis present

## 2023-07-05 DIAGNOSIS — I252 Old myocardial infarction: Secondary | ICD-10-CM

## 2023-07-05 DIAGNOSIS — E872 Acidosis, unspecified: Secondary | ICD-10-CM | POA: Diagnosis not present

## 2023-07-05 DIAGNOSIS — D62 Acute posthemorrhagic anemia: Secondary | ICD-10-CM | POA: Diagnosis not present

## 2023-07-05 DIAGNOSIS — E119 Type 2 diabetes mellitus without complications: Secondary | ICD-10-CM

## 2023-07-05 DIAGNOSIS — E11649 Type 2 diabetes mellitus with hypoglycemia without coma: Secondary | ICD-10-CM | POA: Diagnosis not present

## 2023-07-05 DIAGNOSIS — Z87891 Personal history of nicotine dependence: Secondary | ICD-10-CM | POA: Diagnosis not present

## 2023-07-05 DIAGNOSIS — Z803 Family history of malignant neoplasm of breast: Secondary | ICD-10-CM

## 2023-07-05 DIAGNOSIS — R7989 Other specified abnormal findings of blood chemistry: Secondary | ICD-10-CM

## 2023-07-05 DIAGNOSIS — M109 Gout, unspecified: Secondary | ICD-10-CM | POA: Diagnosis present

## 2023-07-05 DIAGNOSIS — D7589 Other specified diseases of blood and blood-forming organs: Secondary | ICD-10-CM | POA: Diagnosis present

## 2023-07-05 DIAGNOSIS — Z743 Need for continuous supervision: Secondary | ICD-10-CM | POA: Diagnosis not present

## 2023-07-05 DIAGNOSIS — E860 Dehydration: Secondary | ICD-10-CM | POA: Diagnosis present

## 2023-07-05 DIAGNOSIS — Z823 Family history of stroke: Secondary | ICD-10-CM

## 2023-07-05 DIAGNOSIS — Z818 Family history of other mental and behavioral disorders: Secondary | ICD-10-CM

## 2023-07-05 DIAGNOSIS — K7200 Acute and subacute hepatic failure without coma: Secondary | ICD-10-CM | POA: Diagnosis present

## 2023-07-05 DIAGNOSIS — Z888 Allergy status to other drugs, medicaments and biological substances status: Secondary | ICD-10-CM

## 2023-07-05 DIAGNOSIS — R000 Tachycardia, unspecified: Secondary | ICD-10-CM | POA: Diagnosis not present

## 2023-07-05 DIAGNOSIS — N179 Acute kidney failure, unspecified: Secondary | ICD-10-CM | POA: Diagnosis not present

## 2023-07-05 DIAGNOSIS — Z0389 Encounter for observation for other suspected diseases and conditions ruled out: Secondary | ICD-10-CM | POA: Diagnosis not present

## 2023-07-05 DIAGNOSIS — Z515 Encounter for palliative care: Secondary | ICD-10-CM

## 2023-07-05 DIAGNOSIS — Z833 Family history of diabetes mellitus: Secondary | ICD-10-CM

## 2023-07-05 DIAGNOSIS — Z8249 Family history of ischemic heart disease and other diseases of the circulatory system: Secondary | ICD-10-CM

## 2023-07-05 DIAGNOSIS — C7951 Secondary malignant neoplasm of bone: Secondary | ICD-10-CM | POA: Diagnosis present

## 2023-07-05 DIAGNOSIS — F419 Anxiety disorder, unspecified: Secondary | ICD-10-CM | POA: Diagnosis present

## 2023-07-05 DIAGNOSIS — R6883 Chills (without fever): Secondary | ICD-10-CM | POA: Diagnosis present

## 2023-07-05 DIAGNOSIS — R197 Diarrhea, unspecified: Secondary | ICD-10-CM | POA: Diagnosis not present

## 2023-07-05 DIAGNOSIS — Z811 Family history of alcohol abuse and dependence: Secondary | ICD-10-CM

## 2023-07-05 DIAGNOSIS — Z6828 Body mass index (BMI) 28.0-28.9, adult: Secondary | ICD-10-CM

## 2023-07-05 DIAGNOSIS — C61 Malignant neoplasm of prostate: Secondary | ICD-10-CM | POA: Diagnosis not present

## 2023-07-05 DIAGNOSIS — E161 Other hypoglycemia: Secondary | ICD-10-CM | POA: Diagnosis not present

## 2023-07-05 DIAGNOSIS — R6889 Other general symptoms and signs: Secondary | ICD-10-CM | POA: Diagnosis not present

## 2023-07-05 DIAGNOSIS — I499 Cardiac arrhythmia, unspecified: Secondary | ICD-10-CM | POA: Diagnosis not present

## 2023-07-05 LAB — COMPREHENSIVE METABOLIC PANEL
ALT: 83 U/L — ABNORMAL HIGH (ref 0–44)
AST: 200 U/L — ABNORMAL HIGH (ref 15–41)
Albumin: 1.7 g/dL — ABNORMAL LOW (ref 3.5–5.0)
Alkaline Phosphatase: 175 U/L — ABNORMAL HIGH (ref 38–126)
Anion gap: 22 — ABNORMAL HIGH (ref 5–15)
BUN: 20 mg/dL (ref 8–23)
CO2: 16 mmol/L — ABNORMAL LOW (ref 22–32)
Calcium: 6.8 mg/dL — ABNORMAL LOW (ref 8.9–10.3)
Chloride: 90 mmol/L — ABNORMAL LOW (ref 98–111)
Creatinine, Ser: 1.89 mg/dL — ABNORMAL HIGH (ref 0.61–1.24)
GFR, Estimated: 39 mL/min — ABNORMAL LOW (ref >60)
Glucose, Bld: 45 mg/dL — ABNORMAL LOW (ref 70–99)
Potassium: 3 mmol/L — ABNORMAL LOW (ref 3.5–5.1)
Sodium: 128 mmol/L — ABNORMAL LOW (ref 135–145)
Total Bilirubin: 6.5 mg/dL — ABNORMAL HIGH (ref 0.3–1.2)
Total Protein: 5 g/dL — ABNORMAL LOW (ref 6.5–8.1)

## 2023-07-05 LAB — BPAM RBC
Blood Product Expiration Date: 202409082359
Blood Product Expiration Date: 202409082359
ISSUE DATE / TIME: 202408092355
Unit Type and Rh: 5100
Unit Type and Rh: 5100

## 2023-07-05 LAB — URINALYSIS, W/ REFLEX TO CULTURE (INFECTION SUSPECTED)
Bacteria, UA: NONE SEEN
Glucose, UA: NEGATIVE mg/dL
Hgb urine dipstick: NEGATIVE
Ketones, ur: NEGATIVE mg/dL
Leukocytes,Ua: NEGATIVE
Nitrite: NEGATIVE
Protein, ur: 30 mg/dL — AB
Specific Gravity, Urine: 1.019 (ref 1.005–1.030)
pH: 5 (ref 5.0–8.0)

## 2023-07-05 LAB — LIPASE, BLOOD: Lipase: 21 U/L (ref 11–51)

## 2023-07-05 LAB — TYPE AND SCREEN
ABO/RH(D): O POS
Antibody Screen: NEGATIVE
Unit division: 0
Unit division: 0

## 2023-07-05 LAB — CBC
HCT: 17.8 % — ABNORMAL LOW (ref 39.0–52.0)
Hemoglobin: 5.6 g/dL — ABNORMAL LOW (ref 13.0–17.0)
MCH: 32.2 pg (ref 26.0–34.0)
MCHC: 31.5 g/dL (ref 30.0–36.0)
MCV: 102.3 fL — ABNORMAL HIGH (ref 80.0–100.0)
Platelets: 74 10*3/uL — ABNORMAL LOW (ref 150–400)
RBC: 1.74 MIL/uL — ABNORMAL LOW (ref 4.22–5.81)
RDW: 22.7 % — ABNORMAL HIGH (ref 11.5–15.5)
WBC: 10.2 10*3/uL (ref 4.0–10.5)
nRBC: 0 % (ref 0.0–0.2)

## 2023-07-05 LAB — PROTIME-INR
INR: 2.7 — ABNORMAL HIGH (ref 0.8–1.2)
Prothrombin Time: 29.2 seconds — ABNORMAL HIGH (ref 11.4–15.2)

## 2023-07-05 LAB — BILIRUBIN, FRACTIONATED(TOT/DIR/INDIR)
Bilirubin, Direct: 4 mg/dL — ABNORMAL HIGH (ref 0.0–0.2)
Indirect Bilirubin: 2.3 mg/dL — ABNORMAL HIGH (ref 0.3–0.9)
Total Bilirubin: 6.3 mg/dL — ABNORMAL HIGH (ref 0.3–1.2)

## 2023-07-05 LAB — CBG MONITORING, ED: Glucose-Capillary: 90 mg/dL (ref 70–99)

## 2023-07-05 LAB — PREPARE RBC (CROSSMATCH)

## 2023-07-05 LAB — LACTIC ACID, PLASMA: Lactic Acid, Venous: >9 mmol/L (ref 0.5–1.9)

## 2023-07-05 MED ORDER — ACETAMINOPHEN 325 MG RE SUPP: 650.0000 mg | Freq: Four times a day (QID) | RECTAL | Status: DC | PRN

## 2023-07-05 MED ORDER — POTASSIUM CHLORIDE IN NACL 20-0.9 MEQ/L-% IV SOLN
INTRAVENOUS | Status: DC
  Filled 2023-07-05 (×2): qty 1000

## 2023-07-05 MED ORDER — ACETAMINOPHEN 325 MG PO TABS: 650.0000 mg | ORAL_TABLET | Freq: Four times a day (QID) | ORAL | Status: DC | PRN

## 2023-07-05 MED ORDER — FOLIC ACID 1 MG PO TABS
1.0000 mg | ORAL_TABLET | Freq: Every day | ORAL | Status: DC
  Administered 2023-07-06: 1 mg via ORAL
  Filled 2023-07-05: qty 1

## 2023-07-05 MED ORDER — SODIUM CHLORIDE 0.9 % IV SOLN
10.0000 mL/h | Freq: Once | INTRAVENOUS | Status: AC
  Administered 2023-07-05: 10 mL/h via INTRAVENOUS

## 2023-07-05 MED ORDER — TRAZODONE HCL 50 MG PO TABS: 25.0000 mg | ORAL_TABLET | Freq: Every evening | ORAL | Status: DC | PRN

## 2023-07-05 MED ORDER — LACTATED RINGERS IV BOLUS
1000.0000 mL | Freq: Once | INTRAVENOUS | Status: AC
  Administered 2023-07-05: 1000 mL via INTRAVENOUS

## 2023-07-05 MED ORDER — ONDANSETRON HCL 4 MG/2ML IJ SOLN: 4.0000 mg | Freq: Four times a day (QID) | INTRAMUSCULAR | Status: DC | PRN

## 2023-07-05 MED ORDER — OXYCODONE HCL 5 MG PO TABS
5.0000 mg | ORAL_TABLET | ORAL | Status: DC | PRN
  Administered 2023-07-06: 5 mg via ORAL
  Filled 2023-07-05: qty 1

## 2023-07-05 MED ORDER — ADULT MULTIVITAMIN W/MINERALS CH
1.0000 | ORAL_TABLET | Freq: Every day | ORAL | Status: DC
  Administered 2023-07-06: 1 via ORAL
  Filled 2023-07-05: qty 1

## 2023-07-05 MED ORDER — ONDANSETRON 4 MG PO TBDP: 4.0000 mg | ORAL_TABLET | Freq: Four times a day (QID) | ORAL | Status: DC | PRN

## 2023-07-05 MED ORDER — LORAZEPAM 0.5 MG PO TABS: 0.5000 mg | ORAL_TABLET | ORAL | Status: DC | PRN

## 2023-07-05 MED ORDER — DEXTROSE 50 % IV SOLN
1.0000 | Freq: Once | INTRAVENOUS | Status: AC
  Administered 2023-07-05: 50 mL via INTRAVENOUS
  Filled 2023-07-05: qty 50

## 2023-07-05 MED ORDER — SODIUM CHLORIDE 0.9 % IV BOLUS
1000.0000 mL | Freq: Once | INTRAVENOUS | Status: AC
  Administered 2023-07-05: 1000 mL via INTRAVENOUS

## 2023-07-05 MED ORDER — ONDANSETRON HCL 4 MG PO TABS
4.0000 mg | ORAL_TABLET | Freq: Four times a day (QID) | ORAL | Status: DC | PRN
  Administered 2023-07-06: 4 mg via ORAL
  Filled 2023-07-05: qty 1

## 2023-07-05 MED ORDER — LOPERAMIDE HCL 2 MG PO CAPS: 2.0000 mg | ORAL_CAPSULE | ORAL | Status: DC | PRN

## 2023-07-05 MED ORDER — MAGNESIUM HYDROXIDE 400 MG/5ML PO SUSP: 30.0000 mL | Freq: Every day | ORAL | Status: DC | PRN

## 2023-07-05 NOTE — ED Notes (Addendum)
ED Provider at bedside. 

## 2023-07-05 NOTE — ED Notes (Signed)
[place] RN at bedside for blood draw attempt.

## 2023-07-05 NOTE — ED Notes (Signed)
Lab tech at bedside for blood draw, tech unsuccessful.

## 2023-07-05 NOTE — ED Provider Notes (Signed)
[facility] Provider Note    Event Date/Time   First MD Initiated Contact with Patient 07/05/23 1618     (approximate)   History   Failure To Thrive   HPI  Roger [name] is a 67 y.o. male who presents to the emergency department today because of concerns for weakness.  Patient does have a history of metastatic prostate cancer.  Had admission last month for weakness and poor oral intake.  He states he has been doing about the same since then.  He has very low p.o. intake.  Has had chills.  Additionally hospice nurse is concerned that he might be developing liver failure given concern for scleral icterus.  Patient denies any pain.  No recent fevers.     Physical Exam   Triage Vital Signs: ED Triage Vitals  Encounter Vitals Group     BP 07/05/23 1531 (!) 80/58     Systolic BP Percentile --      Diastolic BP Percentile --      Pulse Rate 07/05/23 1531 (!) 109     Resp 07/05/23 1531 16     Temp 07/05/23 1531 97.6 F (36.4 C)     Temp src --      SpO2 07/05/23 1531 97 %     Weight 07/05/23 1532 200 lb (90.7 kg)     Height 07/05/23 1532 5\' 10"  (1.778 m)     Head Circumference --      Peak Flow --      Pain Score 07/05/23 1532 0     Pain Loc --      Pain Education --      Exclude from Growth Chart --     Most recent vital signs: Vitals:   07/05/23 1531  BP: (!) 80/58  Pulse: (!) 109  Resp: 16  Temp: 97.6 F (36.4 C)  SpO2: 97%   General: Awake, alert, oriented. CV:  Good peripheral perfusion. Tachycardia. Resp:  Normal effort. Normal Abd:  No distention.  Other:  Scleral icterus   ED Results / Procedures / Treatments   Labs (all labs ordered are listed, but only abnormal results are displayed) Labs Reviewed  CBC - Abnormal; Notable for the following components:      Result Value   RBC 1.74 (*)    Hemoglobin 5.6 (*)    HCT 17.8 (*)    MCV 102.3 (*)    RDW 22.7 (*)    Platelets 74 (*)    All other components within normal  limits  LACTIC ACID, PLASMA - Abnormal; Notable for the following components:   Lactic Acid, Venous >9.0 (*)    All other components within normal limits  COMPREHENSIVE METABOLIC PANEL - Abnormal; Notable for the following components:   Sodium 128 (*)    Potassium 3.0 (*)    Chloride 90 (*)    CO2 16 (*)    Glucose, Bld 45 (*)    Creatinine, Ser 1.89 (*)    Calcium 6.8 (*)    Total Protein 5.0 (*)    Albumin 1.7 (*)    AST 200 (*)    ALT 83 (*)    Alkaline Phosphatase 175 (*)    Total Bilirubin 6.5 (*)    GFR, Estimated 39 (*)    Anion gap 22 (*)    All other components within normal limits  URINALYSIS, W/ REFLEX TO CULTURE (INFECTION SUSPECTED) - Abnormal; Notable for the following components:   Color, Urine AMBER (*)  APPearance HAZY (*)    Bilirubin Urine SMALL (*)    Protein, ur 30 (*)    All other components within normal limits  BILIRUBIN, FRACTIONATED(TOT/DIR/INDIR) - Abnormal; Notable for the following components:   Total Bilirubin 6.3 (*)    Bilirubin, Direct 4.0 (*)    Indirect Bilirubin 2.3 (*)    All other components within normal limits  PROTIME-INR - Abnormal; Notable for the following components:   Prothrombin Time 29.2 (*)    INR 2.7 (*)    All other components within normal limits  CULTURE, BLOOD (ROUTINE X 2)  CULTURE, BLOOD (ROUTINE X 2)  LIPASE, BLOOD  LACTIC ACID, PLASMA  BASIC METABOLIC PANEL  CBC  CBG MONITORING, ED  PREPARE RBC (CROSSMATCH)  TYPE AND SCREEN     EKG  I, Phineas Semen, attending physician, personally viewed and interpreted this EKG  EKG Time: 1635 Rate: 101 Rhythm: sinus tachycardia Axis: normal Intervals: qtc 488 QRS: q waves II, III, aVF ST changes: no st elevation Impression: abnormal ekg    RADIOLOGY I independently interpreted and visualized the cxr. My interpretation: No pneumonia Radiology interpretation:  IMPRESSION:  1. No acute cardiopulmonary disease.  2. 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     PROCEDURES:  Critical Care performed: Yes  CRITICAL CARE Performed by: Phineas Semen   Total critical care time: 35 minutes  Critical care time was exclusive of separately billable procedures and treating other patients.  Critical care was necessary to treat or prevent imminent or life-threatening deterioration.  Critical care was time spent personally by me on the following activities: development of treatment plan with patient and/or surrogate as well as nursing, discussions with consultants, evaluation of patient's response to treatment, examination of patient, obtaining history from patient or surrogate, ordering and performing treatments and interventions, ordering and review of laboratory studies, ordering and review of radiographic studies, pulse oximetry and re-evaluation of patient's condition.   Procedures    MEDICATIONS ORDERED IN ED: Medications  sodium chloride 0.9 % bolus 1,000 mL (has no administration in time range)     IMPRESSION / MDM / ASSESSMENT AND PLAN / ED COURSE  I reviewed the triage vital signs and the nursing notes.                              Differential diagnosis includes, but is not limited to, failure to thrive, dehydration, liver failure, electrolyte abnormality  Patient's presentation is most consistent with acute presentation with potential threat to life or bodily function.   The patient is on the cardiac monitor to evaluate for evidence of arrhythmia and/or significant heart rate changes.  Patient presents to the emergency department today because of concerns for weakness and poor oral intake.  Patient has history of metastatic prostate cancer.  Had hospitalization last month for similar symptoms.  Will plan on checking blood work and starting IV fluids.  Patient's blood work is notable for multiple derangements.  Patient is significantly anemic today.  I did have a discussion with the patient.  He would like  blood products at this time.  Did consent patient for blood transfusion.  Additionally patient's creatinine is elevated over baseline.  Notably as well if patient's LFTs are all elevated.  Unclear etiology of the liver issues however I do think is significant than elevation of INR as well.  Additionally patient's lactic acid level is elevated.  However at this time I  have low suspicion for infection.  Patient had recent hospitalization where his lactic acid was also elevated.  This time I think could be secondary to dehydration.  No fever or leukocytosis.  I did have a discussion with patient and family.  Did discuss the severity of the patient's blood work.  At this time however they would like to continue treatment.  Discussed with Dr. Arville Care with the hospitalist service who will evaluate for admission.      FINAL CLINICAL IMPRESSION(S) / ED DIAGNOSES   Final diagnoses:  Anemia, unspecified type  Lactic acidosis  Acute liver failure without hepatic coma  AKI (acute kidney injury) (HCC)     Note:  This document was prepared using Dragon voice recognition software and may include unintentional dictation errors.    Phineas Semen, MD 07/05/23 [phone]

## 2023-07-05 NOTE — ED Notes (Signed)
Lab called to collect blood cultures due to IV team not being able to collect cultures upon IV start.

## 2023-07-05 NOTE — Assessment & Plan Note (Signed)
-   This is likely associated with liver cell failure. - He already has thrombocytopenia and PT/INR pending. - Will follow LFTs with current management.

## 2023-07-05 NOTE — ED Triage Notes (Signed)
Pt to ED for failure to thrive, has not been eating and drinking for past few weeks. Currently on chemo for prostate cancer.

## 2023-07-05 NOTE — Assessment & Plan Note (Addendum)
-   This is acute on chronic anemia. - Will check stool Hemoccults as well as B12 and folate given his macrocytosis. - The patient was open crossmatch and will be transfused 2 units of packed red blood cells. - Will follow posttransfusion H&H.

## 2023-07-05 NOTE — Assessment & Plan Note (Signed)
-   He has stage IV prostate cancer. - Pain management will be provided. - I introduced palliative care here as well as comfort care to the family.

## 2023-07-05 NOTE — Assessment & Plan Note (Signed)
-   Will continue as needed Ativan.

## 2023-07-05 NOTE — Assessment & Plan Note (Signed)
-   Will hydrate with IV normal saline. - Will follow BMPs. - Will avoid nephrotoxins.

## 2023-07-05 NOTE — ED Notes (Signed)
3 RN attempted IV access with no success. Ultrasound order placed.

## 2023-07-05 NOTE — ED Notes (Signed)
1 set of BC's sent, light green top, blue top, and type/screen

## 2023-07-05 NOTE — ED Notes (Signed)
Assumed care of pt at this time. Pt is AAXO4, on CCM, VS stable and WNL. No needs identified at this time. Pt accompanied by family member

## 2023-07-05 NOTE — Assessment & Plan Note (Signed)
-   The patient will be placed on supplemental coverage with NovoLog. 

## 2023-07-05 NOTE — ED Notes (Signed)
2 IV attempt unsuccessful. Pt will not sit still

## 2023-07-05 NOTE — ED Notes (Signed)
Messaged pharmacy for ordered potassium infusion bag

## 2023-07-05 NOTE — ED Notes (Signed)
Pt verbally consented for a blood transfusion. This RN, pt sister, daughter, and provider at bedside as witnesses. Pt requested daughter sign e-consent.

## 2023-07-05 NOTE — ED Triage Notes (Signed)
Pt comes via EMs from home with c/o decreased intake and refusal to eat. Pt has metastatic prostate cancer and has not been  taking any meds for that.   BP-110/64 CBG-70 HR-102 O2-96% RA

## 2023-07-05 NOTE — Assessment & Plan Note (Signed)
-   This is treated with dehydration, AKI, hyperkalemia, hyponatremia, metabolic acidosis, hypoglycemia, hypocalcemia and elevated LFTs with possible liver cell failure. - The patient will be admitted to progressive unit bed. - He will be hydrated with IV normal saline with added potassium chloride. - Will follow BMP. - Dietitian consult to be obtained. - PT consult to be obtained.

## 2023-07-05 NOTE — ED Notes (Signed)
Attempted blood draw for type and screen. Unable to obtain specimen, pt son at bedside updated. Called lab and requested for lab tech to draw required blood draw.

## 2023-07-05 NOTE — H&P (Signed)
[place]   PATIENT NAME: Roger [name]    MR#:  [id number]  DATE OF BIRTH:  1956/04/15  DATE OF ADMISSION:  07/05/2023  PRIMARY CARE PHYSICIAN: Alfredia Ferguson, PA-C   Patient is coming from: Home  REQUESTING/REFERRING PHYSICIAN: Phineas Semen,  MD  CHIEF COMPLAINT:   Chief Complaint  Patient presents with   Failure To Thrive    HISTORY OF PRESENT ILLNESS:  Roger [name] is a 67 y.o. African-American male with medical history significant for type 2 diabetes mellitus, gout, hypertension, metastatic prostate cancer and thrombocytopenia, who presented to the emergency room for acute onset of worsening generalized weakness with failure to thrive.  The patient was noted to have significant jaundice by his hospice nurse.  He has been having significant anorexia.  He admitted to nausea, vomiting and diarrhea yesterday.  He stays cold.  He has been having diminished urine output without dysuria or hematuria or flank pain.  No fever or chills.  No chest pain or palpitations.  No cough or wheezing or hemoptysis.  No worsening abdominal pain.  ED Course: When he came to the ER, BP was 80/50 with heart rate of 109 and otherwise normal vital signs.  BP later on was 110/79 after hydration.  Labs revealed hyponatremia 128 and hypochloremia of 90, hypokalemia of 3, creatinine 1.89 and glucose 45 with anion gap of 22 and calcium 6.8 BUN of 20, alk phos 175, albumin 1.7 and total protein 5, AST of 200 from 118 last month and ALT of 83 up from 30.  Total bili of 6.5 up from 1.1 then.  Lactic acid was more than 9 up from 2 then.  CBC showed significant anemia with hemoglobin 5.6 and hematocrit 17.8 down from 8.6 and 25.8 on 7//24 with platelets of 74 compared to 62 then. EKG as reviewed by me : None Imaging: Pleural chest x-ray showed no acute cardiopulmonary disease.  It showed diffuse sclerotic metastasis throughout the axial skeleton.  The patient was given an amp of D50 and 2 L bolus of IV  lactate ringer.  The patient's family elected aggressive management.  He will be admitted to a progressive bed for further evaluation and management. PAST MEDICAL HISTORY:   Past Medical History:  Diagnosis Date   Chest pain    a. 04/2016 MV: small, mild apical defect c/w apical ischemia, EF 55-65%-->Low risk study.   Diabetes mellitus without complication (HCC)    Gout    Hematochezia    History of echocardiogram    a. 04/2016 Echo: EF 55-60%, mild LVH, no rwma, midly dil LA.   History of heart attack    Hypertension    Morbid obesity (HCC)    Prostate cancer (HCC)    Thrombocytopenia (HCC) 09/28/2022    PAST SURGICAL HISTORY:   Past Surgical History:  Procedure Laterality Date   COLONOSCOPY WITH PROPOFOL N/A 08/05/2018   Procedure: COLONOSCOPY WITH PROPOFOL;  Surgeon: Toney Reil, MD;  Location: [facility] ENDOSCOPY;  Service: Gastroenterology;  Laterality: N/A;   thumb surgery      SOCIAL HISTORY:   Social History   Tobacco Use   Smoking status: Former    Current packs/day: 0.00    Average packs/day: 0.3 packs/day for 25.0 years (6.3 ttl pk-yrs)    Types: Cigarettes    Start date: 66    Quit date: 79    Years since quitting: 30.6    Passive exposure: Past   Smokeless tobacco: Never  Substance  Use Topics   Alcohol use: Yes    Alcohol/week: 42.0 standard drinks of alcohol    Types: 10 Cans of beer, 32 Shots of liquor per week    Comment: everyday. 3-5 servings whiskey nightly & beer    FAMILY HISTORY:   Family History  Problem Relation Age of Onset   Heart Problems Mother    Breast cancer Mother    Mental illness Father    Heart disease Father    Stroke Father    Diabetes Father    Alcohol abuse Father    Bipolar disorder Father    Heart disease Other    Hypertension Other    Diabetes Other     DRUG ALLERGIES:   Allergies  Allergen Reactions   Albuterol Nausea And Vomiting   Mucinex [Guaifenesin Er] Rash    REVIEW OF SYSTEMS:   ROS As  per history of present illness. All pertinent systems were reviewed above. Constitutional, HEENT, cardiovascular, respiratory, GI, GU, musculoskeletal, neuro, psychiatric, endocrine, integumentary and hematologic systems were reviewed and are otherwise negative/unremarkable except for positive findings mentioned above in the HPI.   MEDICATIONS AT HOME:   Prior to Admission medications   Medication Sig Start Date End Date Taking? Authorizing Provider  folic acid (FOLVITE) 1 MG tablet Take 1 tablet (1 mg total) by mouth daily. 04/24/23   Rickard Patience, MD  loperamide (IMODIUM) 2 MG capsule Take 2 mg by mouth as needed. 03/06/23   [provider]  LORazepam (ATIVAN) 0.5 MG tablet Take 0.5 mg by mouth every 4 (four) hours as needed. 03/04/23   [provider]  Multiple Vitamin (MULTIVITAMIN WITH MINERALS) TABS tablet Take 1 tablet by mouth daily. 05/31/23   Darlin Priestly, MD  ondansetron (ZOFRAN-ODT) 4 MG disintegrating tablet Take 4 mg by mouth every 6 (six) hours as needed. 05/04/23   [provider]  oxyCODONE (OXY IR/ROXICODONE) 5 MG immediate release tablet Take 5 mg by mouth every 4 (four) hours as needed. 02/19/23   [provider]      VITAL SIGNS:  Blood pressure 101/63, pulse 99, temperature (!) 97.5 F (36.4 C), temperature source Oral, resp. rate 17, height 5\' 10"  (1.778 m), weight 90.7 kg, SpO2 100%.  PHYSICAL EXAMINATION:  Physical Exam  GENERAL:  67 y.o.-year-old chronically ill, lethargic, African-American male patient lying in the bed with no acute distress.  EYES: Pupils equal, round, reactive to light and accommodation. No scleral icterus. Extraocular muscles intact.  HEENT: Head atraumatic, normocephalic. Oropharynx and nasopharynx clear.  NECK:  Supple, no jugular venous distention. No thyroid enlargement, no tenderness.  LUNGS: Normal breath sounds bilaterally, no wheezing, rales,rhonchi or crepitation. No use of accessory muscles of respiration.   CARDIOVASCULAR: Regular rate and rhythm, S1, S2 normal. No murmurs, rubs, or gallops.  ABDOMEN: Soft, nondistended, nontender. Bowel sounds present. No organomegaly or mass.  EXTREMITIES: Trace bilateral lower extremity pitting edema, with no cyanosis, or clubbing.  NEUROLOGIC: Cranial nerves II through XII are intact. Muscle strength 5/5 in all extremities. Sensation intact. Gait not checked.  PSYCHIATRIC: The patient is alert and oriented x 3.  Normal affect and good eye contact. SKIN: No obvious rash, lesion, or ulcer.   LABORATORY PANEL:   CBC Recent Labs  Lab 07/05/23 1535  WBC 10.2  HGB 5.6*  HCT 17.8*  PLT 74*   ------------------------------------------------------------------------------------------------------------------  Chemistries  Recent Labs  Lab 07/05/23 1622  NA 128*  K 3.0*  CL 90*  CO2 16*  GLUCOSE 45*  BUN 20  CREATININE 1.89*  CALCIUM 6.8*  AST 200*  ALT 83*  ALKPHOS 175*  BILITOT 6.5*   ------------------------------------------------------------------------------------------------------------------  Cardiac Enzymes No results for input(s): "TROPONINI" in the last 168 hours. ------------------------------------------------------------------------------------------------------------------  RADIOLOGY:  DG Chest Port 1 View  Result Date: 07/05/2023 CLINICAL DATA:  Questionable sepsis. Decreased intake. Metastatic prostate cancer. EXAM: PORTABLE CHEST 1 VIEW COMPARISON:  One-view chest x-ray 05/28/2023 FINDINGS: The heart size is exaggerate by low lung volumes. No edema or effusion is present. Diffuse sclerotic metastases are present throughout the axial skeleton. No pathologic fractures are evident. IMPRESSION: 1. No acute cardiopulmonary disease. 2. Diffuse sclerotic metastases throughout the axial skeleton. Electronically Signed   By: Marin Roberts M.D.   On: 07/05/2023 17:07      IMPRESSION AND PLAN:  Assessment and Plan: * Failure to  thrive in adult - This is treated with dehydration, AKI, hyperkalemia, hyponatremia, metabolic acidosis, hypoglycemia, hypocalcemia and elevated LFTs with possible liver cell failure. - The patient will be admitted to progressive unit bed. - He will be hydrated with IV normal saline with added potassium chloride. - Will follow BMP. - Dietitian consult to be obtained. - PT consult to be obtained.  AKI (acute kidney injury) (HCC) - Will hydrate with IV normal saline. - Will follow BMPs. - Will avoid nephrotoxins.  Hypokalemia - Potassium will be replaced and magnesium level will be checked.  Symptomatic anemia - This is acute on chronic anemia. - Will check stool Hemoccults as well as B12 and folate given his macrocytosis. - The patient was open crossmatch and will be transfused 2 units of packed red blood cells. - Will follow posttransfusion H&H.  Prostate cancer metastatic to bone [facility]) - He has stage IV prostate cancer. - Pain management will be provided. - I introduced palliative care here as well as comfort care to the family.  Anxiety - Will continue as needed Ativan.  Elevated LFTs - This is likely associated with liver cell failure. - He already has thrombocytopenia and PT/INR pending. - Will follow LFTs with current management.  Type 2 diabetes mellitus without complications (HCC) - The patient will be placed on supplemental coverage with NovoLog.   DVT prophylaxis: SCD's.  Medical prophylaxis currently contraindicated due to thrombocytopenia. Advanced Care Planning:  Code Status: The patient is DNR and DNI. Family Communication:  The plan of care was discussed in details with the patient (and family). I answered all questions. The patient agreed to proceed with the above mentioned plan. Further management will depend upon hospital course. Disposition Plan: Back to previous home environment Consults called: none. All the records are reviewed and case discussed with  ED provider.  Status is: Inpatient   At the time of the admission, it appears that the appropriate admission status for this patient is inpatient.  This is judged to be reasonable and necessary in order to provide the required intensity of service to ensure the patient's safety given the presenting symptoms, physical exam findings and initial radiographic and laboratory data in the context of comorbid conditions.  The patient requires inpatient status due to high intensity of service, high risk of further deterioration and high frequency of surveillance required.  I certify that at the time of admission, it is my clinical judgment that the patient will require inpatient hospital care extending more than 2 midnights.  Dispo: The patient is from: Home              Anticipated d/c is to: Home              Patient currently is not medically stable to d/c.              Difficult to place patient: No  Hannah Beat M.D on 07/05/2023 at 10:37 PM  [facility]   From 7 PM-7 AM, contact night-coverage [email or web address]  CC: Primary care physician; Alfredia Ferguson, PA-C

## 2023-07-05 NOTE — ED Notes (Signed)
Provided urine incontinence care. Replaced bed linen, bed pads, pt gown, and blankets. Pt repositioned, side rails up x2,  and call light placed back within reach. No further needs identified at this time.

## 2023-07-05 NOTE — Assessment & Plan Note (Signed)
-   Potassium will be replaced and magnesium level will be checked. ?

## 2023-07-05 NOTE — Progress Notes (Signed)
[facility] ED [facility]       This patient is a current hospice patient with [facility], admitted 3.26.24 with a terminal diagnosis of malignant neoplasm of prostate.     [facility] will continue to follow for any discharge planning needs and to coordinate continuation of hospice care.   Please don't hesitate to call with any Hospice related questions or concerns.    Thank you for the opportunity to participate in this patient's care. Thea Gist, Charity fundraiser, BSN [facility] Liaison [phone]

## 2023-07-06 DIAGNOSIS — R627 Adult failure to thrive: Secondary | ICD-10-CM | POA: Diagnosis not present

## 2023-07-06 LAB — IRON AND TIBC: Iron: 79 ug/dL (ref 45–182)

## 2023-07-06 LAB — CBG MONITORING, ED
Glucose-Capillary: 70 mg/dL (ref 70–99)
Glucose-Capillary: 70 mg/dL (ref 70–99)

## 2023-07-06 LAB — FERRITIN: Ferritin: 4575 ng/mL — ABNORMAL HIGH (ref 24–336)

## 2023-07-06 LAB — FOLATE: Folate: 4.2 ng/mL — ABNORMAL LOW (ref >5.9)

## 2023-07-06 LAB — VITAMIN B12: Vitamin B-12: 4910 pg/mL — ABNORMAL HIGH (ref 180–914)

## 2023-07-06 MED ORDER — MORPHINE SULFATE (PF) 2 MG/ML IV SOLN: 1.0000 mg | INTRAVENOUS | Status: DC | PRN

## 2023-07-06 MED ORDER — CALCIUM GLUCONATE-NACL 1-0.675 GM/50ML-% IV SOLN
1.0000 g | Freq: Once | INTRAVENOUS | Status: AC
  Administered 2023-07-06: 1000 mg via INTRAVENOUS
  Filled 2023-07-06: qty 50

## 2023-07-06 NOTE — ED Notes (Signed)
1st unit of PRBC's transfusion complete. No adverse reaction noted at this time. VS stable and WNL.2nd unit released.

## 2023-07-06 NOTE — ED Notes (Signed)
Per hospice RN Diannia Ruder, IV fluids, cardiac monitoring discontinued. Pt. Resting comfortably, denies need at this time.

## 2023-07-06 NOTE — Progress Notes (Signed)
[facility] Hospitalized Hospice Patient  Current hospice patient followed at home for terminal diagnosis of prostate cancer.  Hospice nurse visited patient yesterday and noted jaundice in both eyes with Tillman drainage.  He had increased weakness and hospice nurse called to speak with patient's daughter, [place], regarding patient's status and to discuss possible transfer to the Hospice Home.  Daughter decided to have patient sent to the hospital for aggressive treatment.   Hospital Liaison team will follow patient through discharge disposition.  Please call with any questions or concerns.  Norris Cross, RN Nurse Liaison [phone]

## 2023-07-06 NOTE — ED Notes (Signed)
ED [place] RN at bedside for blood draw and IV attempt.

## 2023-07-06 NOTE — Discharge Summary (Addendum)
Physician Discharge Summary  Roger [name] UUV:[id number] DOB: 12-Feb-1956 DOA: 07/05/2023  PCP: Alfredia Ferguson, PA-C  Admit date: 07/05/2023 Discharge date: 07/06/2023  Admitted From: Home Disposition:  Residential hospice  Recommendations for Outpatient Follow-up:  Per hospice providers  Home Health:NA Equipment/Devices:None  Discharge Condition:Hospice CODE STATUS:DNR Diet recommendation: Comfort  Brief/Interim Summary: 67 y.o. African-American male with medical history significant for type 2 diabetes mellitus, gout, hypertension, metastatic prostate cancer and thrombocytopenia, who presented to the emergency room for acute onset of worsening generalized weakness with failure to thrive.  The patient was noted to have significant jaundice by his hospice nurse.  He has been having significant anorexia.  He admitted to nausea, vomiting and diarrhea yesterday.  He stays cold.  He has been having diminished urine output without dysuria or hematuria or flank pain.  No fever or chills.  No chest pain or palpitations.  No cough or wheezing or hemoptysis.  No worsening abdominal pain.   Seen in consultation by palliative care and hospice liaison.  After discussion with family and patient, decision made to discharge to hospice home.   Discharge Diagnoses:  Principal Problem:   Failure to thrive in adult Active Problems:   AKI (acute kidney injury) (HCC)   Symptomatic anemia   Hypokalemia   Prostate cancer metastatic to bone (HCC)   Type 2 diabetes mellitus without complications (HCC)   Elevated LFTs   Anxiety  All medications not focused on patient comfort have been discontinued.  Patient will discharge to hospice home.  DNR status   Failure to thrive in adult: likely secondary to stage IV prostate cancer. Pt was made comfort care after a discussion w/ pt and family. D/c to hospice home  Metabolic acidosis & lactic acidosis: etiology unclear. IVFs d/c   AKI: Cr is trending down from  day prior  Hypokalemia: WNL    Macrocytic anemia: w/ possible component of acute blood loss anemia. S/p 2 units of pRBCs transfused. Repeat H&H are trending up   Prostate cancer: metastatic to bone. Stage IV.    Anxiety: severity unknown. Ativan prn    Transaminitis: etiology unclear, possibly liver failure. Will continue to monitor   DM2: well controlled, HbA1c 4.4.   Discharge Instructions  Discharge Instructions     Diet - low sodium heart healthy   Complete by: As directed    Increase activity slowly   Complete by: As directed       Allergies as of 07/06/2023       Reactions   Albuterol Nausea And Vomiting   Mucinex [guaifenesin Er] Rash        Medication List     STOP taking these medications    folic acid 1 MG tablet Commonly known as: FOLVITE   loperamide 2 MG capsule Commonly known as: IMODIUM       TAKE these medications    LORazepam 0.5 MG tablet Commonly known as: ATIVAN Take 0.5 mg by mouth every 4 (four) hours as needed.   multivitamin with minerals Tabs tablet Take 1 tablet by mouth daily.   ondansetron 4 MG disintegrating tablet Commonly known as: ZOFRAN-ODT Take 4 mg by mouth every 6 (six) hours as needed.   oxyCODONE 5 MG immediate release tablet Commonly known as: Oxy IR/ROXICODONE Take 5 mg by mouth every 4 (four) hours as needed.        Allergies  Allergen Reactions   Albuterol Nausea And Vomiting   Mucinex [Guaifenesin Er] Rash    Consultations: Palliative  care   Procedures/Studies: [facility] Chest Port 1 View  Result Date: 07/05/2023 CLINICAL DATA:  Questionable sepsis. Decreased intake. Metastatic prostate cancer. EXAM: PORTABLE CHEST 1 VIEW COMPARISON:  One-view chest x-ray 05/28/2023 FINDINGS: The heart size is exaggerate by low lung volumes. No edema or effusion is present. Diffuse sclerotic metastases are present throughout the axial skeleton. No pathologic fractures are evident. IMPRESSION: 1. No acute  cardiopulmonary disease. 2. Diffuse sclerotic metastases throughout the axial skeleton. Electronically Signed   By: Marin Roberts M.D.   On: 07/05/2023 17:07   (Echo, Carotid, EGD, Colonoscopy, ERCP)    Subjective: Seen and examined on day of DC.  Appropriate for discharge to residential hospice.  Discharge Exam: Vitals:   07/06/23 1130 07/06/23 1200  BP: 104/71 118/76  Pulse: 87 92  Resp: 18 19  Temp:    SpO2: 100% 100%   Vitals:   07/06/23 1030 07/06/23 1100 07/06/23 1130 07/06/23 1200  BP: 100/67 115/79 104/71 118/76  Pulse: 87 88 87 92  Resp: 14 17 18 19   Temp:      TempSrc:      SpO2: 100% 100% 100% 100%  Weight:      Height:           The results of significant diagnostics from this hospitalization (including imaging, microbiology, ancillary and laboratory) are listed below for reference.     Microbiology: Recent Results (from the past 240 hour(s))  Blood Culture (routine x 2)     Status: None (Preliminary result)   Collection Time: 07/05/23  9:29 PM   Specimen: BLOOD RIGHT HAND  Result Value Ref Range Status   Specimen Description BLOOD RIGHT HAND  Final   Special Requests   Final    IN PEDIATRIC BOTTLE Blood Culture results may not be optimal due to an inadequate volume of blood received in culture bottles   Culture   Final    NO GROWTH < 12 HOURS Performed at [facility], [address] [place], [place] [address]    Report Status PENDING  Incomplete  Blood Culture (routine x 2)     Status: None (Preliminary result)   Collection Time: 07/05/23 10:04 PM   Specimen: BLOOD LEFT FOREARM  Result Value Ref Range Status   Specimen Description BLOOD LEFT FOREARM  Final   Special Requests   Final    BOTTLES DRAWN AEROBIC AND ANAEROBIC Blood Culture adequate volume   Culture   Final    NO GROWTH < 12 HOURS Performed at [facility], [address] [place], [place] [address]    Report Status PENDING  Incomplete     Labs: BNP  (last 3 results) Recent Labs    04/05/23 1631  BNP 203.5*   Basic Metabolic Panel: Recent Labs  Lab 07/05/23 1622 07/06/23 0632  NA 128* 130*  K 3.0* 3.6  CL 90* 95*  CO2 16* 15*  GLUCOSE 45* 76  BUN 20 18  CREATININE 1.89* 1.73*  CALCIUM 6.8* 6.8*   Liver Function Tests: Recent Labs  Lab 07/05/23 1622 07/05/23 2204  AST 200*  --   ALT 83*  --   ALKPHOS 175*  --   BILITOT 6.5* 6.3*  PROT 5.0*  --   ALBUMIN 1.7*  --    Recent Labs  Lab 07/05/23 1622  LIPASE 21   No results for input(s): "AMMONIA" in the last 168 hours. CBC: Recent Labs  Lab 07/05/23 1535 07/06/23 0632  WBC 10.2 11.4*  HGB 5.6*  8.9*  HCT 17.8* 27.0*  MCV 102.3* 91.8  PLT 74* 66*   Cardiac Enzymes: No results for input(s): "CKTOTAL", "CKMB", "CKMBINDEX", "TROPONINI" in the last 168 hours. BNP: Invalid input(s): "POCBNP" CBG: Recent Labs  Lab 07/05/23 2129 07/06/23 0022 07/06/23 0604  GLUCAP 90 70 70   D-Dimer No results for input(s): "DDIMER" in the last 72 hours. Hgb A1c No results for input(s): "HGBA1C" in the last 72 hours. Lipid Profile No results for input(s): "CHOL", "HDL", "LDLCALC", "TRIG", "CHOLHDL", "LDLDIRECT" in the last 72 hours. Thyroid function studies No results for input(s): "TSH", "T4TOTAL", "T3FREE", "THYROIDAB" in the last 72 hours.  Invalid input(s): "FREET3" Anemia work up Recent Labs    07/06/23 0632  FOLATE 4.2*  FERRITIN 4,575*  TIBC NOT CALCULATED  IRON 79   Urinalysis    Component Value Date/Time   COLORURINE AMBER (A) 07/05/2023 2025   APPEARANCEUR HAZY (A) 07/05/2023 2025   LABSPEC 1.019 07/05/2023 2025   PHURINE 5.0 07/05/2023 2025   GLUCOSEU NEGATIVE 07/05/2023 2025   HGBUR NEGATIVE 07/05/2023 2025   BILIRUBINUR SMALL (A) 07/05/2023 2025   KETONESUR NEGATIVE 07/05/2023 2025   PROTEINUR 30 (A) 07/05/2023 2025   NITRITE NEGATIVE 07/05/2023 2025   LEUKOCYTESUR NEGATIVE 07/05/2023 2025   Sepsis Labs Recent Labs  Lab 07/05/23 1535  07/06/23 0632  WBC 10.2 11.4*   Microbiology Recent Results (from the past 240 hour(s))  Blood Culture (routine x 2)     Status: None (Preliminary result)   Collection Time: 07/05/23  9:29 PM   Specimen: BLOOD RIGHT HAND  Result Value Ref Range Status   Specimen Description BLOOD RIGHT HAND  Final   Special Requests   Final    IN PEDIATRIC BOTTLE Blood Culture results may not be optimal due to an inadequate volume of blood received in culture bottles   Culture   Final    NO GROWTH < 12 HOURS Performed at [facility], [address] [place], [place] [address]    Report Status PENDING  Incomplete  Blood Culture (routine x 2)     Status: None (Preliminary result)   Collection Time: 07/05/23 10:04 PM   Specimen: BLOOD LEFT FOREARM  Result Value Ref Range Status   Specimen Description BLOOD LEFT FOREARM  Final   Special Requests   Final    BOTTLES DRAWN AEROBIC AND ANAEROBIC Blood Culture adequate volume   Culture   Final    NO GROWTH < 12 HOURS Performed at [facility], [address] [place], [place] [address]    Report Status PENDING  Incomplete     Time coordinating discharge: Over 30 minutes  SIGNED:   Charise Killian, MD  [facility] 07/06/2023, 1:05 PM Pager   If 7PM-7AM, please contact night-coverage

## 2023-07-06 NOTE — ED Notes (Addendum)
Diannia Ruder of [facility] at bedside. Diannia Ruder is speaking with pt's daughter [place] on the telephone. Dr. Mayford Knife updated. Paul Montini, pt's son added to conversation. Additional family members added to telephone conversation. Diannia Ruder proceeding with "goals of care" conversation.

## 2023-07-06 NOTE — Group Note (Deleted)
Date:  07/06/2023 Time:  2:12 AM  Group Topic/Focus:  Wrap-Up Group:   The focus of this group is to help patients review their daily goal of treatment and discuss progress on daily workbooks.     Participation Level:  {BHH PARTICIPATION ZOXWR:60454}  Participation Quality:  {BHH PARTICIPATION QUALITY:22265}  Affect:  {BHH AFFECT:22266}  Cognitive:  {BHH COGNITIVE:22267}  Insight: {BHH Insight2:20797}  Engagement in Group:  {BHH ENGAGEMENT IN UJWJX:91478}  Modes of Intervention:  {BHH MODES OF INTERVENTION:22269}  Additional Comments:  ***  Maglione,Anagabriela Jokerst E 07/06/2023, 2:12 AM

## 2023-07-06 NOTE — ED Notes (Signed)
Critical LAB RESULT Lactic acid >9  Dr. Mayford Knife notified. 0730

## 2023-07-06 NOTE — ED Notes (Signed)
No adverse reaction to blood transfusion at this time. Pt remains on CCM, VS stable and WNL. No needs identified. Call light within reach.

## 2023-07-06 NOTE — Progress Notes (Deleted)
VOID

## 2023-07-06 NOTE — ED Notes (Signed)
No adverse reaction to blood transfusion at this time. 2ND unit of PRBC's complete. Pt remains on CCM, VS stable and WNL. No needs identified. Call light within reach.

## 2023-07-06 NOTE — Progress Notes (Signed)
Palliative consult received.  Mr. Cillo current and active Hospice patient with [facility]. Spoke with Renea Ee, RN-[facility] Liaison, she is aware of admission, will be following through hospitalization and will be in contact with family. Aware to engage with PMT if needs or concerns arise.  No Charge.  Leeanne Deed, DNP, AGNP-C Palliative Medicine  Please call Palliative Medicine team phone with any questions [phone]. For individual providers please see AMION.

## 2023-07-07 ENCOUNTER — Emergency Department
Admission: EM | Admit: 2023-07-07 | Discharge: 2023-07-07 | Disposition: A | Discharge status: Home / Self Care | Attending: Emergency Medicine | Admitting: Emergency Medicine

## 2023-07-07 ENCOUNTER — Other Ambulatory Visit: Payer: Self-pay

## 2023-07-07 DIAGNOSIS — R6251 Failure to thrive (child): Secondary | ICD-10-CM

## 2023-07-07 DIAGNOSIS — R69 Illness, unspecified: Secondary | ICD-10-CM | POA: Diagnosis not present

## 2023-07-07 DIAGNOSIS — R404 Transient alteration of awareness: Secondary | ICD-10-CM | POA: Diagnosis not present

## 2023-07-07 DIAGNOSIS — I10 Essential (primary) hypertension: Secondary | ICD-10-CM | POA: Insufficient documentation

## 2023-07-07 DIAGNOSIS — R627 Adult failure to thrive: Secondary | ICD-10-CM | POA: Insufficient documentation

## 2023-07-07 DIAGNOSIS — E876 Hypokalemia: Secondary | ICD-10-CM | POA: Diagnosis not present

## 2023-07-07 DIAGNOSIS — Z8546 Personal history of malignant neoplasm of prostate: Secondary | ICD-10-CM | POA: Insufficient documentation

## 2023-07-07 DIAGNOSIS — Z7189 Other specified counseling: Secondary | ICD-10-CM | POA: Insufficient documentation

## 2023-07-07 DIAGNOSIS — E119 Type 2 diabetes mellitus without complications: Secondary | ICD-10-CM | POA: Insufficient documentation

## 2023-07-07 DIAGNOSIS — Z743 Need for continuous supervision: Secondary | ICD-10-CM | POA: Diagnosis not present

## 2023-07-07 DIAGNOSIS — Z7401 Bed confinement status: Secondary | ICD-10-CM | POA: Diagnosis not present

## 2023-07-07 MED ORDER — OXYCODONE-ACETAMINOPHEN 5-325 MG PO TABS
1.0000 | ORAL_TABLET | Freq: Once | ORAL | Status: AC
  Administered 2023-07-07: 1 via ORAL
  Filled 2023-07-07: qty 1

## 2023-07-07 NOTE — Progress Notes (Signed)
[facility] Hospitalized Hospice Patient  This patient is a current hospice patient with [facility] admitted to services on 3.26.24 with a terminal diagnosis of malignant neoplasm of prostate.     Met with patient at the bedside.  Patient's aunt was present.  Goals of care conversation had with patient.  Patient did not want to make any decisions regarding his care and wanted to defer to his adult children.  Called [place], patient's daughter, (whom patient lives with), his son, Renae Fickle and another daughter.  Multiple attempts made by aunt and other siblings to get patient's son, Assunta Curtis on the phone without success.    Goals of care conversation had with options presented.  Patient states he wants to be comfortable but wants to make sure his children are all on board with the decision to move forward with a comfort approach and no further aggressive interventions.  Patient and his children endorse moving forward with symptom management of patient's pain.  Given options, all are in agreement with transition from the ED to [facility]) for symptom management of pain.  Report to hospital team.  Plans for patient to transfer to the [facility] today. Patient to transfer via EMS.  Discharge Summary will be sent to Hospice Home IPU at time of discharge.  Norris Cross, RN Nurse Liaison [phone]

## 2023-07-07 NOTE — ED Notes (Signed)
Family at bedside. 

## 2023-07-07 NOTE — Progress Notes (Signed)
[facility] Hospitalized [facility]ospice Patient   This patient is a current hospice patient with [facility] admitted to services on 3.26.24 with a terminal diagnosis of malignant neoplasm of prostate.  Patient was transferred from [facility] ED to [facility]) yesterday (8.10.24) after goals of care discussion was had with patient and 3 of his 4 children.  Multiple attempts were made to reach 4th sibling without success.  Discision was made to move to comfort measures with symptom management of pain.  Patient did not want to continue with aggressive treatment and his majority children supported his decision.  Received a call from [facility] staff that patient's son, Assunta Curtis came to visit his father at the Hospice Home today and was not in agreement with comfort care.  He wanted his father to be transferred back to [facility] for further treatment.  Hospice Home staff called patient's other siblings that were involved in goals of care discussion yesterday.  Assunta Curtis spoke with his siblings regarding his desire to have patient sent back to the hospital and that he was not in agreement with comfort care and he was not ready to give up on his father.  [place], patient's daughter, was the only one who wanted to continue with comfort approach.  All other siblings, changed their minds and agreed with [name] to have patient transferred back to [facility] for aggressive care and treatment options.  Patient remains hospice appropriateness and under [facility]'s care. We will continue to follow patient through hospitalization.  Please reach out for any hospice related concerns or needs.  Norris Cross, [facility] Liaison [phone]

## 2023-07-07 NOTE — ED Notes (Signed)
RN called report to hospice facility.

## 2023-07-07 NOTE — ED Notes (Signed)
D/c instructions explained to hospice RN.

## 2023-07-07 NOTE — ED Notes (Signed)
Spoke with hospice RN who stated to leave IV in place upon d/c from ER.

## 2023-07-07 NOTE — ED Provider Notes (Signed)
[facility] Provider Note    Event Date/Time   First MD Initiated Contact with Patient 07/07/23 1542     (approximate)   History   hospice re-evaluation (Pt presents to er via ems per family members. Pt is currently on hospice for cancer (unknown) and cirrhosis at this time. Family called ems requesting a re-eval for hospice. Pt current mentation is baseline. No complaints at this time. )   HPI  Roger [name] is a 67 y.o. male with history of metastatic prostate cancer, T2DM, HTN recently discharged to hospice home presenting to the emergency department for family concerns.  Patient was seen in our ER on 8/9 after presenting with failure to thrive during which she was found to have AKI, hypokalemia, symptomatic anemia, elevated LFTs with a bilirubin of 6.5.  During his admission, family discussion was had and decision was made to transfer the patient to the hospice house.  The patient's eldest son, Roger [name], was not present during this discussion and presents with the patient to the ER today requesting further discussion about patient's condition and plan of care.  Patient somnolent, but briefly arousable, denies acute complaints during my evaluation.     Physical Exam   Triage Vital Signs: ED Triage Vitals  Encounter Vitals Group     BP 07/07/23 1550 101/79     Systolic BP Percentile --      Diastolic BP Percentile --      Pulse Rate 07/07/23 1550 100     Resp 07/07/23 1550 16     Temp 07/07/23 1550 (!) 97.3 F (36.3 C)     Temp Source 07/07/23 1550 Oral     SpO2 07/07/23 1543 99 %     Weight 07/07/23 1553 178 lb (80.7 kg)     Height --      Head Circumference --      Peak Flow --      Pain Score --      Pain Loc --      Pain Education --      Exclude from Growth Chart --     Most recent vital signs: Vitals:   07/07/23 1630 07/07/23 1732  BP: (!) 117/90 104/82  Pulse:  100  Resp:  20  Temp:    SpO2:  100%     General: Somnolent, arouses  to voice or touch  HEENT: Scleral icterus present CV:  Mild tachycardia, normal peripheral perfusion Resp:  Lungs clear, respirations mildly labored Abd:  Soft, fullness of the abdomen without significant appreciable tenderness Neuro:  Generalized weakness  ED Results / Procedures / Treatments   Labs (all labs ordered are listed, but only abnormal results are displayed) Labs Reviewed - No data to display   EKG EKG independently reviewed interpreted by myself (ER attending) demonstrates:    RADIOLOGY Imaging independently reviewed and interpreted by myself demonstrates:    PROCEDURES:  Critical Care performed: No  Procedures   MEDICATIONS ORDERED IN ED: Medications  oxyCODONE-acetaminophen (PERCOCET/ROXICET) 5-325 MG per tablet 1 tablet (1 tablet Oral Given 07/07/23 1729)     IMPRESSION / MDM / ASSESSMENT AND PLAN / ED COURSE  I reviewed the triage vital signs and the nursing notes.  Differential diagnosis includes, but is not limited to, failure to thrive with ongoing sequela from recent admission  Patient's presentation is most consistent with acute presentation with potential threat to life or bodily function.  68 year old male recently discharged to hospice house presenting with family  for further discussions about his medical condition and goals of care.  Care of from [facility] services did present in person to the ER.  She was involved with the family discussion that took place during patient's recent admission.  I discussed my limitations in discussing patient's medical status and prognosis as I am just meeting him for the first time, but based on my review of his chart, did discuss that patient had metastatic cancer with signs of multisystem organ damage.  After discussion with both myself and Roger [name], patient's son did wish to discharge him back to the hospice home without further workup in the ER.  Patient did begin to appear uncomfortable here in the ER, so  he was ordered for a dose of his prescribed oxycodone.  Patient discharged to hospice home for further management.    FINAL CLINICAL IMPRESSION(S) / ED DIAGNOSES   Final diagnoses:  Failure to thrive (child)  Counseling regarding goals of care     Rx / DC Orders   ED Discharge Orders     None        Note:  This document was prepared using Dragon voice recognition software and may include unintentional dictation errors.   Trinna Post, MD 07/07/23 1745

## 2023-07-07 NOTE — ED Notes (Signed)
Patient denies pain and is resting comfortably.  

## 2023-07-28 DEATH — deceased

## 2023-08-06 ENCOUNTER — Ambulatory Visit: Payer: 59 | Admitting: Family Medicine

## 2023-08-06 NOTE — Progress Notes (Deleted)
Established patient visit   Patient: Roger [name]   DOB: 05-Aug-1956   67 y.o. Male  MRN: [id number] Visit Date: 08/06/2023  Today's healthcare provider: Ronnald Ramp, MD   No chief complaint on file.  Subjective       Discussed the use of AI scribe software for clinical note transcription with the patient, who gave verbal consent to proceed.  History of Present Illness             Past Medical History:  Diagnosis Date   Chest pain    a. 04/2016 MV: small, mild apical defect c/w apical ischemia, EF 55-65%-->Low risk study.   Diabetes mellitus without complication (HCC)    Gout    Hematochezia    History of echocardiogram    a. 04/2016 Echo: EF 55-60%, mild LVH, no rwma, midly dil LA.   History of heart attack    Hypertension    Morbid obesity (HCC)    Prostate cancer (HCC)    Thrombocytopenia (HCC) 09/28/2022    Medications: Outpatient Medications Prior to Visit  Medication Sig   LORazepam (ATIVAN) 0.5 MG tablet Take 0.5 mg by mouth every 4 (four) hours as needed.   Multiple Vitamin (MULTIVITAMIN WITH MINERALS) TABS tablet Take 1 tablet by mouth daily.   ondansetron (ZOFRAN-ODT) 4 MG disintegrating tablet Take 4 mg by mouth every 6 (six) hours as needed.   oxyCODONE (OXY IR/ROXICODONE) 5 MG immediate release tablet Take 5 mg by mouth every 4 (four) hours as needed.   No facility-administered medications prior to visit.    Review of Systems  {Insert previous labs (optional):23779} {See past labs  Heme  Chem  Endocrine  Serology  Results Review (optional):1}   Objective    There were no vitals taken for this visit. {Insert last BP/Wt (optional):23777}{See vitals history (optional):1}   Physical Exam  ***  No results found for any visits on 08/06/23.  Assessment & Plan     Problem List Items Addressed This Visit     Anemia, chronic disease (Chronic)   Essential hypertension   Generalized weakness   HLD (hyperlipidemia)    Prostate cancer metastatic to bone (HCC) - Primary (Chronic)   Other Visit Diagnoses     Under care of hospice team           Assessment and Plan              No follow-ups on file.         Ronnald Ramp, MD  [facility] [phone] (phone) [phone] (fax)  [facility]

## 2023-09-04 NOTE — Telephone Encounter (Signed)
Care Management   Outreach Note   Name: Roger [name] MRN: [id number] DOB: Feb 13, 1956  An unsuccessful telephone outreach was attempted today to contact the patient about Care Management needs.   Message received from clinic provider regarding need for care management services.  Follow Up Plan:  A HIPAA compliant phone message was left for the patient providing contact information and requesting a return call.    [facility] [phone]

## 2023-09-04 NOTE — Telephone Encounter (Signed)
Care Management   Visit Note   Name: Roger [name] MRN: [id number] DOB: 1956-11-09  Subjective: Roger [name] is a 67 y.o. year old male who is a primary care patient of Simmons-Robinson, Tawanna Cooler, MD. The Care Management team was consulted for assistance.      Follow up regarding need for care management services. Brief outreach with patient and his friend/caregiver Roger [name]. Roger [name] reports patient is currently enrolled with [facility]. He did not qualify for PCS services. Currently receiving Nursing, CMA and Chaplain visits. Reports the Hospice team is attempting to increase his hours. Agreed to notify the clinic if his status changes     PLAN:  Patient will continue outreach with the hospice team.    Katha Cabal [facility] [phone]

## 2023-09-15 IMAGING — US US ABDOMEN LIMITED
1 series · 14 of 25 positions shown · non-contrast
Comparison: None.

CLINICAL DATA: Transaminitis.

EXAM:
ULTRASOUND ABDOMEN LIMITED RIGHT UPPER QUADRANT

[Series 1: us abdomen limited ruq (liver/gb) · 14 of 92 slices shown]
[im 1/92]
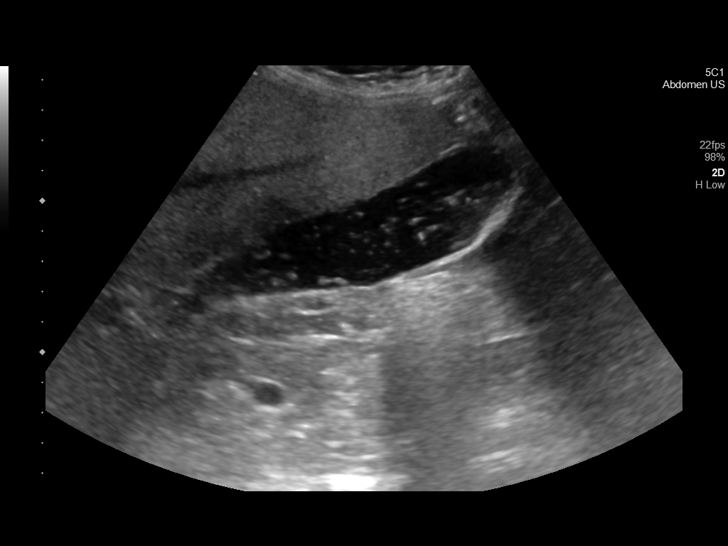
[im 8/92]
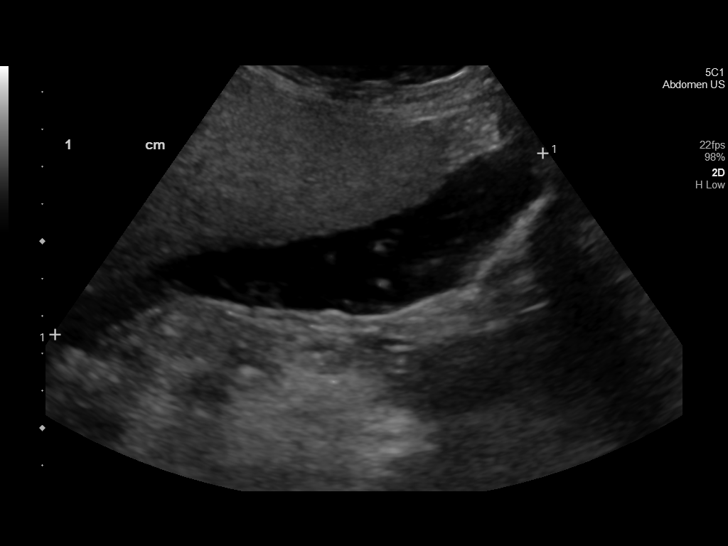
[im 16/92]
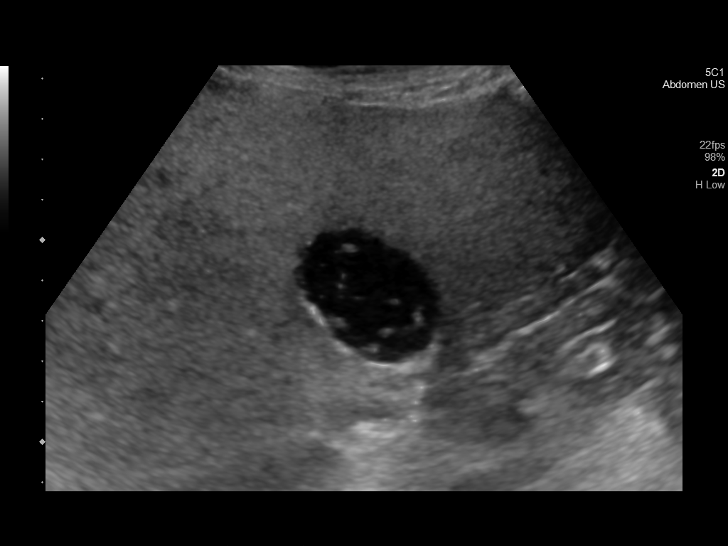
[im 23/92]
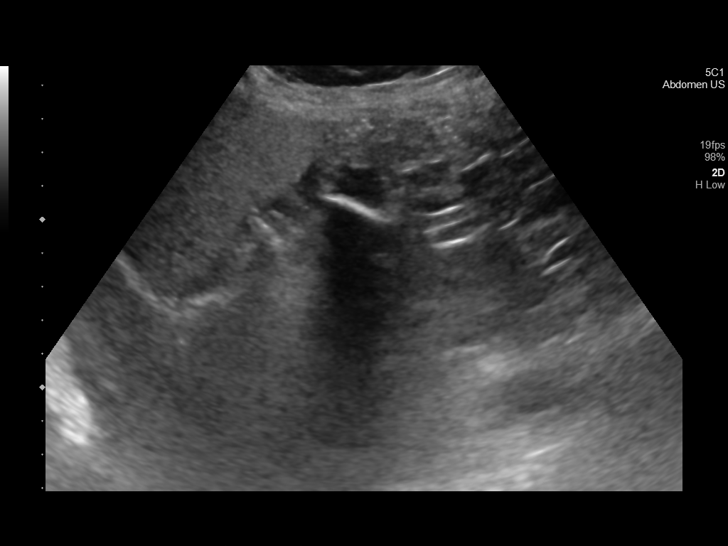
[im 31/92]
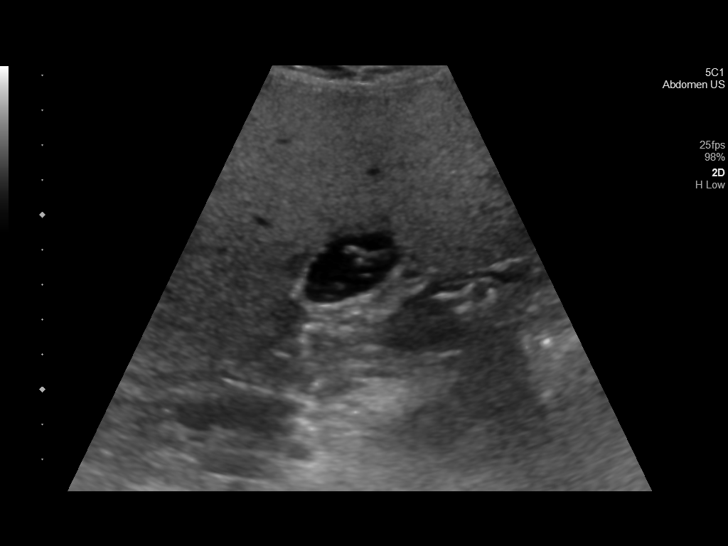
[im 35/92]
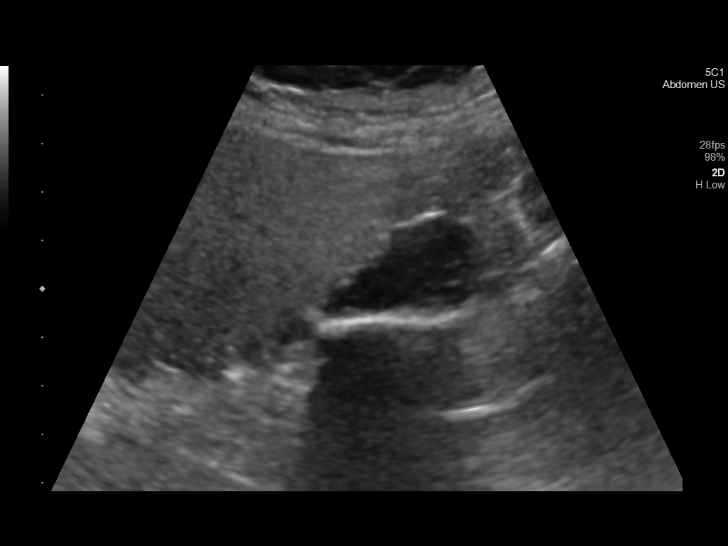
[im 42/92]
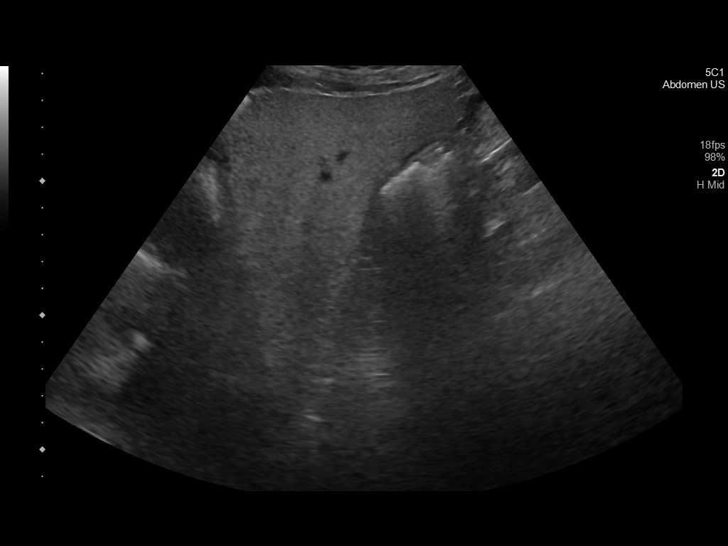
[im 50/92]
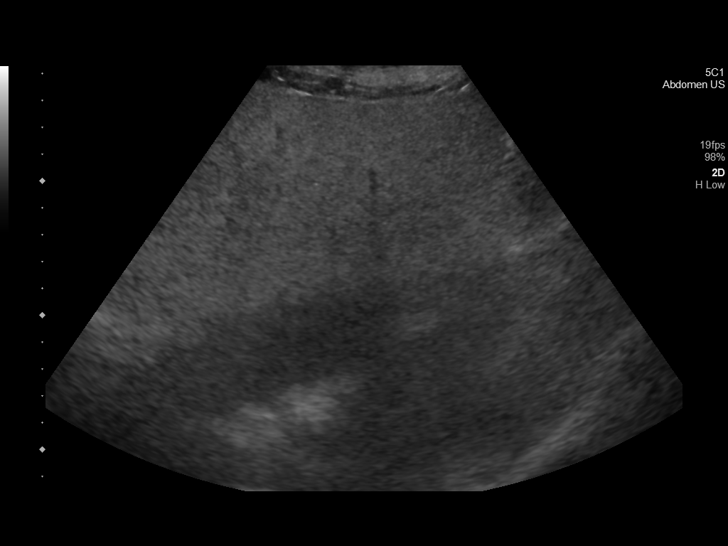
[im 57/92]
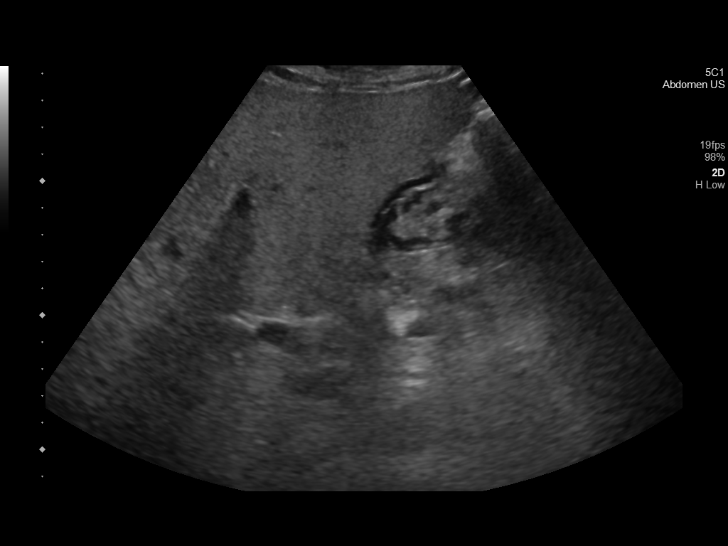
[im 61/92]
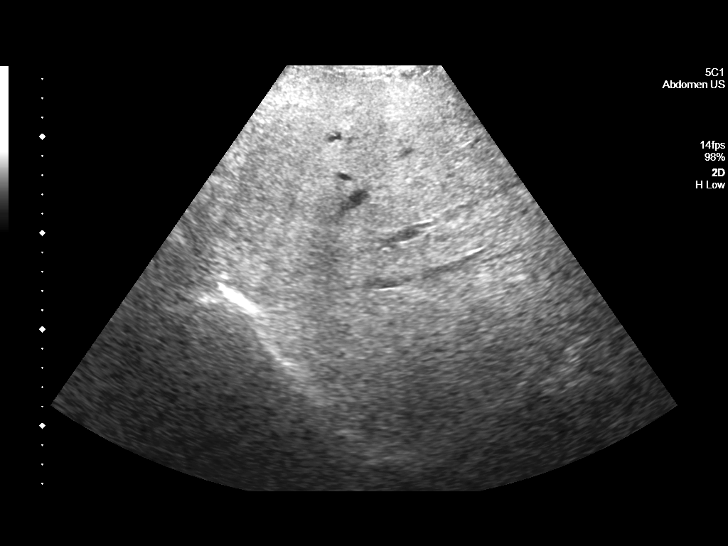
[im 69/92]
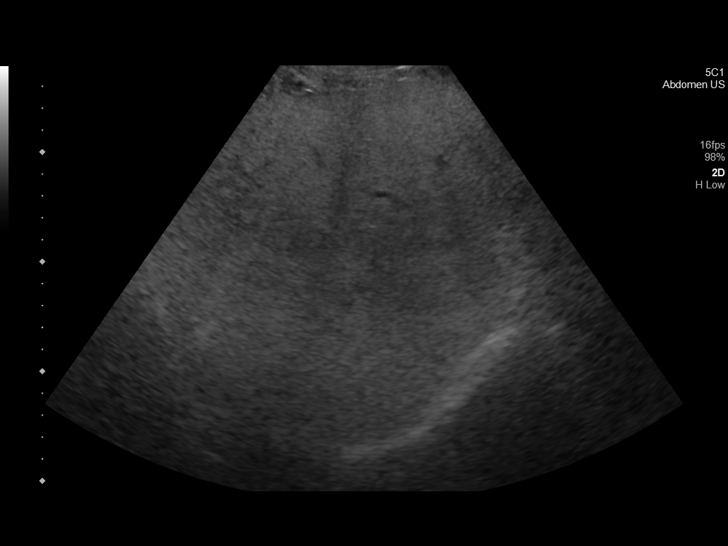
[im 76/92]
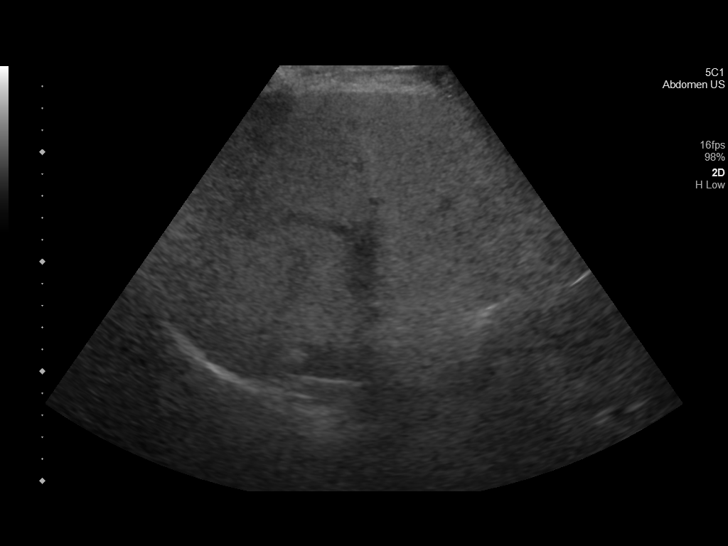
[im 84/92]
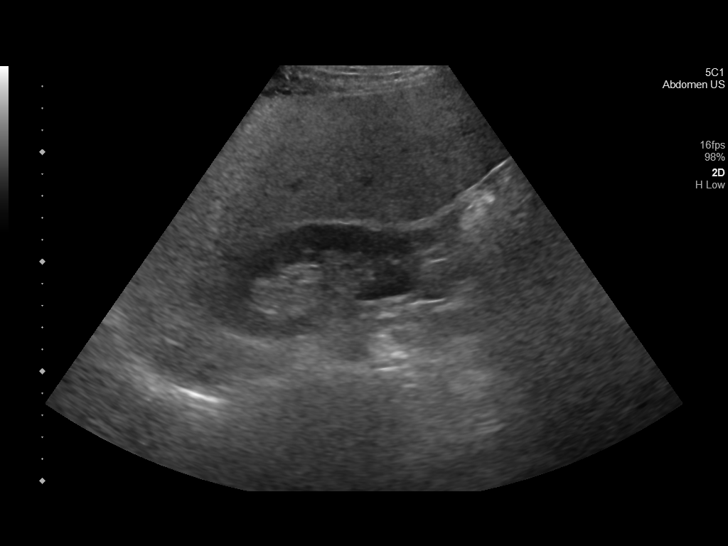
[im 92/92]
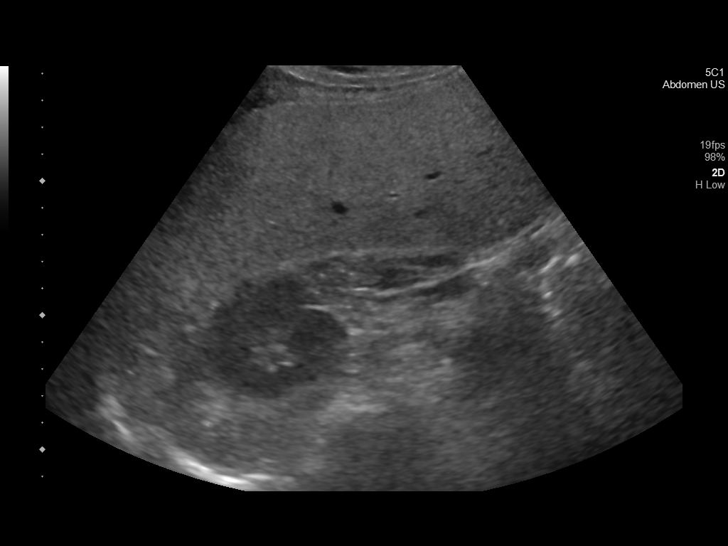

[14 of 25 positions shown; findings below may reference images not displayed]

FINDINGS: Gallbladder:

Gallbladder sludge and gallstones within the gallbladder lumen. No
gallbladder wall thickening or pericholecystic fluid. No sonographic
Murphy sign noted by sonographer.

Common bile duct:

Diameter: 5 mm.

Liver:

No focal lesion identified. Increased parenchymal echogenicity.
Portal vein is patent on color Doppler imaging with normal direction
of blood flow towards the liver.

Other: None.
IMPRESSION: 1. Gallbladder sludge and cholelithiasis.
2. Hepatic steatosis. Please note limited evaluation for focal
hepatic masses in a patient with hepatic steatosis due to decreased
penetration of the acoustic ultrasound waves.

## 2024-07-15 LAB — NM MYOCAR MULTI W/SPECT W/WALL MOTION / EF
LV dias vol: 141 mL (ref 62–150)
LV sys vol: 55 mL
Peak HR: 98 {beats}/min
Percent HR: 60 %
Rest HR: 58 {beats}/min
SDS: 0
SRS: 2
SSS: 0
TID: 1.06
